# Patient Record
Sex: Female | Born: 1960
Health system: Southern US, Community
[De-identification: ages and names within clinical notes are randomized; demographics above are authoritative.]

## PROBLEM LIST (undated history)

## (undated) DIAGNOSIS — J309 Allergic rhinitis, unspecified: Secondary | ICD-10-CM

## (undated) DIAGNOSIS — K649 Unspecified hemorrhoids: Secondary | ICD-10-CM

## (undated) DIAGNOSIS — K6289 Other specified diseases of anus and rectum: Secondary | ICD-10-CM

## (undated) DIAGNOSIS — D509 Iron deficiency anemia, unspecified: Secondary | ICD-10-CM

## (undated) DIAGNOSIS — R21 Rash and other nonspecific skin eruption: Secondary | ICD-10-CM

## (undated) DIAGNOSIS — R062 Wheezing: Secondary | ICD-10-CM

## (undated) DIAGNOSIS — C801 Malignant (primary) neoplasm, unspecified: Secondary | ICD-10-CM

## (undated) DIAGNOSIS — J45909 Unspecified asthma, uncomplicated: Secondary | ICD-10-CM

## (undated) DIAGNOSIS — J209 Acute bronchitis, unspecified: Secondary | ICD-10-CM

## (undated) DIAGNOSIS — F419 Anxiety disorder, unspecified: Secondary | ICD-10-CM

## (undated) DIAGNOSIS — D3A026 Benign carcinoid tumor of the rectum: Secondary | ICD-10-CM

## (undated) DIAGNOSIS — H918X9 Other specified hearing loss, unspecified ear: Secondary | ICD-10-CM

## (undated) DIAGNOSIS — H1045 Other chronic allergic conjunctivitis: Secondary | ICD-10-CM

## (undated) DIAGNOSIS — N6459 Other signs and symptoms in breast: Secondary | ICD-10-CM

## (undated) DIAGNOSIS — I10 Essential (primary) hypertension: Secondary | ICD-10-CM

## (undated) DIAGNOSIS — T7840XA Allergy, unspecified, initial encounter: Secondary | ICD-10-CM

## (undated) DIAGNOSIS — E663 Overweight: Secondary | ICD-10-CM

## (undated) HISTORY — DX: Other specified diseases of anus and rectum: K62.89

## (undated) HISTORY — DX: Other specified hearing loss, unspecified ear: H91.8X9

## (undated) HISTORY — DX: Other signs and symptoms in breast: N64.59

## (undated) HISTORY — DX: Rash and other nonspecific skin eruption: R21

## (undated) HISTORY — PX: COLONOSCOPY: SHX174

## (undated) HISTORY — DX: Unspecified asthma, uncomplicated: J45.909

## (undated) HISTORY — DX: Allergic rhinitis, unspecified: J30.9

## (undated) HISTORY — DX: Essential (primary) hypertension: I10

## (undated) HISTORY — PX: NO PAST SURGERIES: SHX2092

## (undated) HISTORY — DX: Unspecified hemorrhoids: K64.9

## (undated) HISTORY — DX: Anxiety disorder, unspecified: F41.9

## (undated) HISTORY — DX: Malignant (primary) neoplasm, unspecified: C80.1

## (undated) HISTORY — DX: Benign carcinoid tumor of the rectum: D3A.026

## (undated) HISTORY — DX: Acute bronchitis, unspecified: J20.9

## (undated) HISTORY — DX: Other chronic allergic conjunctivitis: H10.45

## (undated) HISTORY — DX: Overweight: E66.3

## (undated) HISTORY — DX: Allergy, unspecified, initial encounter: T78.40XA

## (undated) HISTORY — DX: Wheezing: R06.2

## (undated) HISTORY — DX: Iron deficiency anemia, unspecified: D50.9

---

## 1997-12-11 ENCOUNTER — Ambulatory Visit (HOSPITAL_BASED_OUTPATIENT_CLINIC_OR_DEPARTMENT_OTHER): Admission: RE | Admit: 1997-12-11 | Discharge: 1997-12-11 | Payer: Self-pay | Admitting: General Surgery

## 1999-01-30 ENCOUNTER — Ambulatory Visit (HOSPITAL_COMMUNITY): Admission: RE | Admit: 1999-01-30 | Discharge: 1999-01-30 | Payer: Self-pay | Admitting: Family Medicine

## 1999-01-30 ENCOUNTER — Encounter: Payer: Self-pay | Admitting: Family Medicine

## 2001-02-20 ENCOUNTER — Other Ambulatory Visit: Admission: RE | Admit: 2001-02-20 | Discharge: 2001-02-20 | Payer: Self-pay | Admitting: *Deleted

## 2004-09-06 ENCOUNTER — Encounter: Admission: RE | Admit: 2004-09-06 | Discharge: 2004-09-06 | Payer: Self-pay | Admitting: Obstetrics and Gynecology

## 2004-09-14 ENCOUNTER — Ambulatory Visit: Payer: Self-pay | Admitting: Endocrinology

## 2004-09-21 ENCOUNTER — Ambulatory Visit (HOSPITAL_COMMUNITY): Admission: RE | Admit: 2004-09-21 | Discharge: 2004-09-21 | Payer: Self-pay | Admitting: Obstetrics and Gynecology

## 2004-09-21 ENCOUNTER — Encounter (INDEPENDENT_AMBULATORY_CARE_PROVIDER_SITE_OTHER): Payer: Self-pay | Admitting: Specialist

## 2004-10-08 ENCOUNTER — Ambulatory Visit: Payer: Self-pay | Admitting: Internal Medicine

## 2004-11-01 ENCOUNTER — Ambulatory Visit: Payer: Self-pay | Admitting: Internal Medicine

## 2004-11-02 ENCOUNTER — Ambulatory Visit: Payer: Self-pay | Admitting: Internal Medicine

## 2004-11-03 ENCOUNTER — Ambulatory Visit: Payer: Self-pay | Admitting: Internal Medicine

## 2004-11-09 ENCOUNTER — Ambulatory Visit: Payer: Self-pay | Admitting: Internal Medicine

## 2004-11-12 ENCOUNTER — Ambulatory Visit: Payer: Self-pay | Admitting: Internal Medicine

## 2004-11-19 ENCOUNTER — Ambulatory Visit: Payer: Self-pay | Admitting: Internal Medicine

## 2004-11-22 ENCOUNTER — Ambulatory Visit: Payer: Self-pay | Admitting: Internal Medicine

## 2004-12-06 ENCOUNTER — Ambulatory Visit: Payer: Self-pay | Admitting: Internal Medicine

## 2004-12-08 ENCOUNTER — Ambulatory Visit: Payer: Self-pay | Admitting: Internal Medicine

## 2004-12-15 ENCOUNTER — Encounter (INDEPENDENT_AMBULATORY_CARE_PROVIDER_SITE_OTHER): Payer: Self-pay | Admitting: *Deleted

## 2004-12-15 ENCOUNTER — Ambulatory Visit: Payer: Self-pay | Admitting: Internal Medicine

## 2004-12-17 ENCOUNTER — Ambulatory Visit: Payer: Self-pay | Admitting: Internal Medicine

## 2004-12-27 ENCOUNTER — Ambulatory Visit: Payer: Self-pay | Admitting: Internal Medicine

## 2004-12-30 ENCOUNTER — Ambulatory Visit: Payer: Self-pay | Admitting: Internal Medicine

## 2005-01-13 ENCOUNTER — Ambulatory Visit: Payer: Self-pay | Admitting: Internal Medicine

## 2005-01-19 ENCOUNTER — Ambulatory Visit: Payer: Self-pay | Admitting: Internal Medicine

## 2005-01-25 ENCOUNTER — Ambulatory Visit: Payer: Self-pay | Admitting: Internal Medicine

## 2005-01-25 ENCOUNTER — Ambulatory Visit: Payer: Self-pay | Admitting: Gastroenterology

## 2005-01-27 ENCOUNTER — Ambulatory Visit: Payer: Self-pay | Admitting: Internal Medicine

## 2005-02-02 ENCOUNTER — Ambulatory Visit: Payer: Self-pay | Admitting: Internal Medicine

## 2005-02-04 ENCOUNTER — Ambulatory Visit: Payer: Self-pay | Admitting: Internal Medicine

## 2005-02-07 ENCOUNTER — Ambulatory Visit: Payer: Self-pay | Admitting: Internal Medicine

## 2005-02-10 ENCOUNTER — Ambulatory Visit: Payer: Self-pay | Admitting: Internal Medicine

## 2005-02-14 ENCOUNTER — Ambulatory Visit: Payer: Self-pay | Admitting: Internal Medicine

## 2005-02-15 ENCOUNTER — Ambulatory Visit (HOSPITAL_COMMUNITY): Admission: RE | Admit: 2005-02-15 | Discharge: 2005-02-15 | Payer: Self-pay | Admitting: Gastroenterology

## 2005-02-15 ENCOUNTER — Encounter (INDEPENDENT_AMBULATORY_CARE_PROVIDER_SITE_OTHER): Payer: Self-pay | Admitting: *Deleted

## 2005-02-15 ENCOUNTER — Ambulatory Visit: Payer: Self-pay | Admitting: Gastroenterology

## 2005-02-17 ENCOUNTER — Ambulatory Visit: Payer: Self-pay | Admitting: Internal Medicine

## 2005-02-22 ENCOUNTER — Ambulatory Visit: Payer: Self-pay | Admitting: Internal Medicine

## 2005-02-25 ENCOUNTER — Ambulatory Visit: Payer: Self-pay | Admitting: Internal Medicine

## 2005-03-03 ENCOUNTER — Ambulatory Visit: Payer: Self-pay | Admitting: Internal Medicine

## 2005-03-04 ENCOUNTER — Ambulatory Visit: Payer: Self-pay | Admitting: Internal Medicine

## 2005-03-08 ENCOUNTER — Ambulatory Visit: Payer: Self-pay | Admitting: Internal Medicine

## 2005-03-11 ENCOUNTER — Ambulatory Visit: Payer: Self-pay | Admitting: Gastroenterology

## 2005-03-11 ENCOUNTER — Ambulatory Visit: Payer: Self-pay | Admitting: Internal Medicine

## 2005-03-14 ENCOUNTER — Ambulatory Visit: Payer: Self-pay | Admitting: Internal Medicine

## 2005-03-18 ENCOUNTER — Ambulatory Visit: Payer: Self-pay | Admitting: Internal Medicine

## 2005-03-18 ENCOUNTER — Ambulatory Visit (HOSPITAL_COMMUNITY): Admission: RE | Admit: 2005-03-18 | Discharge: 2005-03-18 | Payer: Self-pay | Admitting: Gastroenterology

## 2005-03-18 ENCOUNTER — Encounter: Payer: Self-pay | Admitting: Gastroenterology

## 2005-03-21 ENCOUNTER — Ambulatory Visit: Payer: Self-pay | Admitting: Internal Medicine

## 2005-03-25 ENCOUNTER — Ambulatory Visit: Payer: Self-pay | Admitting: Internal Medicine

## 2005-03-30 ENCOUNTER — Ambulatory Visit: Payer: Self-pay | Admitting: Internal Medicine

## 2005-04-05 ENCOUNTER — Ambulatory Visit: Payer: Self-pay | Admitting: Internal Medicine

## 2005-04-15 ENCOUNTER — Ambulatory Visit: Payer: Self-pay | Admitting: Internal Medicine

## 2005-04-22 ENCOUNTER — Ambulatory Visit: Payer: Self-pay | Admitting: Internal Medicine

## 2005-05-06 ENCOUNTER — Ambulatory Visit: Payer: Self-pay | Admitting: Internal Medicine

## 2005-06-17 ENCOUNTER — Ambulatory Visit: Payer: Self-pay | Admitting: Internal Medicine

## 2005-07-07 ENCOUNTER — Ambulatory Visit: Payer: Self-pay | Admitting: Internal Medicine

## 2005-08-05 ENCOUNTER — Ambulatory Visit: Payer: Self-pay | Admitting: Internal Medicine

## 2005-08-19 ENCOUNTER — Ambulatory Visit: Payer: Self-pay | Admitting: Internal Medicine

## 2005-09-19 ENCOUNTER — Ambulatory Visit: Payer: Self-pay | Admitting: Internal Medicine

## 2006-12-27 ENCOUNTER — Ambulatory Visit: Payer: Self-pay | Admitting: Internal Medicine

## 2006-12-27 LAB — CONVERTED CEMR LAB
ALT: 16 units/L (ref 0–35)
AST: 20 units/L (ref 0–37)
Albumin: 3.5 g/dL (ref 3.5–5.2)
Basophils Absolute: 0.1 10*3/uL (ref 0.0–0.1)
Calcium: 9 mg/dL (ref 8.4–10.5)
Chloride: 98 meq/L (ref 96–112)
Creatinine, Ser: 0.6 mg/dL (ref 0.4–1.2)
Eosinophils Relative: 3.6 % (ref 0.0–5.0)
GFR calc non Af Amer: 115 mL/min
Glucose, Bld: 90 mg/dL (ref 70–99)
HCT: 35.9 % — ABNORMAL LOW (ref 36.0–46.0)
Ketones, ur: NEGATIVE mg/dL
LDL Cholesterol: 89 mg/dL (ref 0–99)
Neutrophils Relative %: 44.7 % (ref 43.0–77.0)
Nitrite: NEGATIVE
Platelets: 293 10*3/uL (ref 150–400)
RBC: 4.36 M/uL (ref 3.87–5.11)
RDW: 12.8 % (ref 11.5–14.6)
Sodium: 140 meq/L (ref 135–145)
TSH: 1.38 microintl units/mL (ref 0.35–5.50)
Total Bilirubin: 0.5 mg/dL (ref 0.3–1.2)
Total Protein, Urine: NEGATIVE mg/dL
Urine Glucose: NEGATIVE mg/dL
Urobilinogen, UA: 0.2 (ref 0.0–1.0)
VLDL: 9 mg/dL (ref 0–40)
WBC: 7.9 10*3/uL (ref 4.5–10.5)
pH: 6 (ref 5.0–8.0)

## 2007-01-02 ENCOUNTER — Ambulatory Visit: Payer: Self-pay | Admitting: Internal Medicine

## 2007-01-08 ENCOUNTER — Encounter: Admission: RE | Admit: 2007-01-08 | Discharge: 2007-01-08 | Payer: Self-pay | Admitting: Obstetrics and Gynecology

## 2007-01-16 ENCOUNTER — Encounter: Admission: RE | Admit: 2007-01-16 | Discharge: 2007-01-16 | Payer: Self-pay | Admitting: Obstetrics and Gynecology

## 2007-01-23 ENCOUNTER — Encounter: Admission: RE | Admit: 2007-01-23 | Discharge: 2007-01-23 | Payer: Self-pay | Admitting: Obstetrics and Gynecology

## 2007-05-29 ENCOUNTER — Ambulatory Visit: Payer: Self-pay | Admitting: Internal Medicine

## 2007-05-29 DIAGNOSIS — J45909 Unspecified asthma, uncomplicated: Secondary | ICD-10-CM

## 2007-05-29 DIAGNOSIS — E663 Overweight: Secondary | ICD-10-CM | POA: Insufficient documentation

## 2007-05-29 DIAGNOSIS — D509 Iron deficiency anemia, unspecified: Secondary | ICD-10-CM

## 2007-05-29 DIAGNOSIS — J309 Allergic rhinitis, unspecified: Secondary | ICD-10-CM

## 2007-05-29 DIAGNOSIS — R21 Rash and other nonspecific skin eruption: Secondary | ICD-10-CM

## 2007-05-29 HISTORY — DX: Rash and other nonspecific skin eruption: R21

## 2007-05-29 HISTORY — DX: Allergic rhinitis, unspecified: J30.9

## 2007-05-29 HISTORY — DX: Unspecified asthma, uncomplicated: J45.909

## 2007-05-29 HISTORY — DX: Overweight: E66.3

## 2007-05-29 HISTORY — DX: Iron deficiency anemia, unspecified: D50.9

## 2008-02-13 ENCOUNTER — Encounter: Admission: RE | Admit: 2008-02-13 | Discharge: 2008-02-13 | Payer: Self-pay | Admitting: Internal Medicine

## 2008-05-20 ENCOUNTER — Ambulatory Visit: Payer: Self-pay | Admitting: Internal Medicine

## 2008-05-20 LAB — CONVERTED CEMR LAB
AST: 15 units/L (ref 0–37)
Alkaline Phosphatase: 63 units/L (ref 39–117)
BUN: 5 mg/dL — ABNORMAL LOW (ref 6–23)
Bilirubin Urine: NEGATIVE
Bilirubin, Direct: 0.1 mg/dL (ref 0.0–0.3)
Chloride: 107 meq/L (ref 96–112)
Crystals: NEGATIVE
Eosinophils Relative: 5 % (ref 0.0–5.0)
GFR calc Af Amer: 139 mL/min
GFR calc non Af Amer: 115 mL/min
Glucose, Bld: 85 mg/dL (ref 70–99)
HDL: 59.6 mg/dL (ref >39.0)
Lymphocytes Relative: 42.7 % (ref 12.0–46.0)
Monocytes Absolute: 0.8 10*3/uL (ref 0.1–1.0)
Monocytes Relative: 10.7 % (ref 3.0–12.0)
Neutrophils Relative %: 41.6 % — ABNORMAL LOW (ref 43.0–77.0)
Nitrite: NEGATIVE
Platelets: 241 10*3/uL (ref 150–400)
Potassium: 3.8 meq/L (ref 3.5–5.1)
RDW: 12.5 % (ref 11.5–14.6)
Sodium: 139 meq/L (ref 135–145)
Total CHOL/HDL Ratio: 2.5
Total Protein, Urine: NEGATIVE mg/dL
Total Protein: 6.5 g/dL (ref 6.0–8.3)
Urobilinogen, UA: 0.2 (ref 0.0–1.0)
VLDL: 10 mg/dL (ref 0–40)
WBC: 7.5 10*3/uL (ref 4.5–10.5)

## 2008-05-26 ENCOUNTER — Ambulatory Visit: Payer: Self-pay | Admitting: Internal Medicine

## 2008-05-26 ENCOUNTER — Encounter (INDEPENDENT_AMBULATORY_CARE_PROVIDER_SITE_OTHER): Payer: Self-pay | Admitting: *Deleted

## 2008-05-26 DIAGNOSIS — D3A026 Benign carcinoid tumor of the rectum: Secondary | ICD-10-CM | POA: Insufficient documentation

## 2008-05-26 HISTORY — DX: Benign carcinoid tumor of the rectum: D3A.026

## 2008-06-16 ENCOUNTER — Ambulatory Visit: Payer: Self-pay | Admitting: Internal Medicine

## 2008-06-16 DIAGNOSIS — K649 Unspecified hemorrhoids: Secondary | ICD-10-CM | POA: Insufficient documentation

## 2008-06-16 HISTORY — DX: Unspecified hemorrhoids: K64.9

## 2008-06-19 ENCOUNTER — Encounter: Payer: Self-pay | Admitting: Internal Medicine

## 2008-06-19 ENCOUNTER — Ambulatory Visit: Payer: Self-pay | Admitting: Internal Medicine

## 2008-06-23 ENCOUNTER — Ambulatory Visit: Payer: Self-pay | Admitting: Gastroenterology

## 2008-06-23 ENCOUNTER — Telehealth: Payer: Self-pay | Admitting: Internal Medicine

## 2008-06-23 DIAGNOSIS — K6289 Other specified diseases of anus and rectum: Secondary | ICD-10-CM | POA: Insufficient documentation

## 2008-06-23 HISTORY — DX: Other specified diseases of anus and rectum: K62.89

## 2008-06-25 ENCOUNTER — Telehealth: Payer: Self-pay | Admitting: Gastroenterology

## 2008-07-02 ENCOUNTER — Encounter: Payer: Self-pay | Admitting: Internal Medicine

## 2008-08-04 ENCOUNTER — Ambulatory Visit: Payer: Self-pay | Admitting: Gastroenterology

## 2008-08-14 ENCOUNTER — Ambulatory Visit (HOSPITAL_COMMUNITY): Admission: RE | Admit: 2008-08-14 | Discharge: 2008-08-14 | Payer: Self-pay | Admitting: Gastroenterology

## 2008-08-14 ENCOUNTER — Ambulatory Visit: Payer: Self-pay | Admitting: Gastroenterology

## 2008-08-14 ENCOUNTER — Encounter: Payer: Self-pay | Admitting: Gastroenterology

## 2008-11-27 ENCOUNTER — Ambulatory Visit: Payer: Self-pay | Admitting: Internal Medicine

## 2008-11-27 DIAGNOSIS — H1045 Other chronic allergic conjunctivitis: Secondary | ICD-10-CM

## 2008-11-27 HISTORY — DX: Other chronic allergic conjunctivitis: H10.45

## 2009-01-29 ENCOUNTER — Encounter: Admission: RE | Admit: 2009-01-29 | Discharge: 2009-01-29 | Payer: Self-pay | Admitting: Obstetrics and Gynecology

## 2009-08-17 ENCOUNTER — Encounter (INDEPENDENT_AMBULATORY_CARE_PROVIDER_SITE_OTHER): Payer: Self-pay | Admitting: *Deleted

## 2009-10-08 ENCOUNTER — Ambulatory Visit: Payer: Self-pay | Admitting: Internal Medicine

## 2009-10-08 DIAGNOSIS — H918X9 Other specified hearing loss, unspecified ear: Secondary | ICD-10-CM | POA: Insufficient documentation

## 2009-10-08 HISTORY — DX: Other specified hearing loss, unspecified ear: H91.8X9

## 2010-01-20 ENCOUNTER — Ambulatory Visit: Payer: Self-pay | Admitting: Internal Medicine

## 2010-01-20 LAB — CONVERTED CEMR LAB
ALT: 14 units/L (ref 0–35)
AST: 20 units/L (ref 0–37)
Alkaline Phosphatase: 79 units/L (ref 39–117)
BUN: 11 mg/dL (ref 6–23)
Bilirubin, Direct: 0.1 mg/dL (ref 0.0–0.3)
CO2: 29 meq/L (ref 19–32)
Eosinophils Relative: 6.1 % — ABNORMAL HIGH (ref 0.0–5.0)
Glucose, Bld: 93 mg/dL (ref 70–99)
HDL: 52.8 mg/dL (ref >39.00)
Lymphocytes Relative: 39.7 % (ref 12.0–46.0)
Monocytes Relative: 6.8 % (ref 3.0–12.0)
Neutrophils Relative %: 46.4 % (ref 43.0–77.0)
Nitrite: NEGATIVE
Platelets: 301 10*3/uL (ref 150.0–400.0)
Potassium: 4 meq/L (ref 3.5–5.1)
RBC: 4.29 M/uL (ref 3.87–5.11)
Sodium: 141 meq/L (ref 135–145)
Total Bilirubin: 0.3 mg/dL (ref 0.3–1.2)
Total CHOL/HDL Ratio: 3
Urine Glucose: NEGATIVE mg/dL
VLDL: 8.8 mg/dL (ref 0.0–40.0)
WBC: 8.6 10*3/uL (ref 4.5–10.5)
pH: 5.5 (ref 5.0–8.0)

## 2010-01-21 ENCOUNTER — Ambulatory Visit: Payer: Self-pay | Admitting: Internal Medicine

## 2010-01-21 DIAGNOSIS — N6459 Other signs and symptoms in breast: Secondary | ICD-10-CM | POA: Insufficient documentation

## 2010-01-21 HISTORY — DX: Other signs and symptoms in breast: N64.59

## 2010-01-28 ENCOUNTER — Encounter: Payer: Self-pay | Admitting: Internal Medicine

## 2010-01-29 ENCOUNTER — Encounter: Payer: Self-pay | Admitting: Internal Medicine

## 2010-02-04 ENCOUNTER — Encounter: Payer: Self-pay | Admitting: Internal Medicine

## 2010-03-15 ENCOUNTER — Telehealth: Payer: Self-pay | Admitting: Internal Medicine

## 2010-03-24 ENCOUNTER — Encounter (INDEPENDENT_AMBULATORY_CARE_PROVIDER_SITE_OTHER): Payer: Self-pay | Admitting: *Deleted

## 2010-03-26 ENCOUNTER — Encounter (INDEPENDENT_AMBULATORY_CARE_PROVIDER_SITE_OTHER): Payer: Self-pay | Admitting: *Deleted

## 2010-03-29 ENCOUNTER — Ambulatory Visit: Payer: Self-pay | Admitting: Internal Medicine

## 2010-04-08 ENCOUNTER — Ambulatory Visit: Payer: Self-pay | Admitting: Internal Medicine

## 2010-06-04 ENCOUNTER — Ambulatory Visit: Payer: Self-pay | Admitting: Internal Medicine

## 2010-06-06 DIAGNOSIS — J209 Acute bronchitis, unspecified: Secondary | ICD-10-CM

## 2010-06-06 HISTORY — DX: Acute bronchitis, unspecified: J20.9

## 2010-06-22 ENCOUNTER — Ambulatory Visit: Payer: Self-pay | Admitting: Internal Medicine

## 2010-06-22 DIAGNOSIS — R062 Wheezing: Secondary | ICD-10-CM

## 2010-06-22 HISTORY — DX: Wheezing: R06.2

## 2010-07-25 ENCOUNTER — Encounter: Payer: Self-pay | Admitting: Obstetrics and Gynecology

## 2010-08-05 NOTE — Letter (Signed)
Summary: Pre Visit Letter Revised  [facility]  [address] [place], [place] [address]   Phone: [phone]  Fax: [phone]        03/24/2010 MRN: [id number]  Michelle Solomon [address] [place], [place]  [address]             Procedure Date: April 07, 2010  Welcome to the Gastroenterology Division at [facility].    You are scheduled to see a nurse for your pre-procedure visit on March 29, 2010 at 10:30am on the 3rd floor at [facility], [address]  We ask that you try to arrive at our office 15 minutes prior to your appointment time to allow for check-in.  Please take a minute to review the attached form.  If you answer "Yes" to one or more of the questions on the first page, we ask that you call the person listed at your earliest opportunity.  If you answer "No" to all of the questions, please complete the rest of the form and bring it to your appointment.    Your nurse visit will consist of discussing your medical and surgical history, your immediate family medical history, and your medications.   If you are unable to list all of your medications on the form, please bring the medication bottles to your appointment and we will list them.  We will need to be aware of both prescribed and over the counter drugs.  We will need to know exact dosage information as well.    Please be prepared to read and sign documents such as consent forms, a financial agreement, and acknowledgement forms.  If necessary, and with your consent, a friend or relative is welcome to sit-in on the nurse visit with you.  Please bring your insurance card so that we may make a copy of it.  If your insurance requires a referral to see a specialist, please bring your referral form from your primary care physician.  No co-pay is required for this nurse visit.     If you cannot keep your appointment, please call [phone] to cancel or reschedule prior to your  appointment date.  This allows [place] the opportunity to schedule an appointment for another patient in need of care.    Thank you for choosing [facility] for your medical needs.  We appreciate the opportunity to care for you.  Please visit [place] at our website  to learn more about our practice.  Sincerely, The Gastroenterology Division

## 2010-08-05 NOTE — Miscellaneous (Signed)
Summary: [facility] previsit  Clinical Lists Changes 

## 2010-08-05 NOTE — Progress Notes (Signed)
Summary: Schedule Flex  Phone Note Outgoing Call Call back at [facility] Phone [phone]   Call placed by: Harlow Mares CMA Duncan Dull),  March 15, 2010 11:44 AM Call placed to: Patient Summary of Call: Left a message on patients machine to call back, patient is due for a flex.  Initial call taken by: Harlow Mares CMA Duncan Dull),  March 15, 2010 11:44 AM  Follow-up for Phone Call        Left a message on the patient machine to call back and schedule a previsit and procedure with our office. A letter will be mailed to the patient.   Follow-up by: Harlow Mares CMA Duncan Dull),  March 24, 2010 10:33 AM

## 2010-08-05 NOTE — Procedures (Signed)
Summary: Flexible Sigmoidoscopy  Patient: Michelle Solomon Note: All result statuses are Final unless otherwise noted.  Tests: (1) Flexible Sigmoidoscopy (FLX)  FLX Flexible Sigmoidoscopy                             DONE     [facility]     [address]     [place], [place]  [address]           FLEXIBLE SIGMOIDOSCOPY PROCEDURE REPORT           PATIENT:  Michelle Solomon, Michelle Solomon  MR#:  [id number]     BIRTHDATE:  [birth year], 48 yrs. old  GENDER:  female           ENDOSCOPIST:  Iva Boop, MD, [facility]           PROCEDURE DATE:  04/08/2010     PROCEDURE:  Flexible Sigmoidoscopy, diagnostic     ASA CLASS:  Class II     INDICATIONS:  Follow-up rectal carcinoid, removed (second time)     2010           MEDICATIONS:   Fentanyl 25 mcg IV, Versed 4 mg IV           DESCRIPTION OF PROCEDURE:   After the risks benefits and     alternatives of the procedure were thoroughly explained, informed     consent was obtained.  Digital rectal exam was performed and     revealed mildly tender anal canal but no other abnormalities.     The [id number] endoscope was introduced through the anus     and advanced to the sigmoid colon, without limitations.  The     quality of the prep was excellent.  The instrument was then slowly     withdrawn as the mucosa was fully examined.     <<PROCEDUREIMAGES>>           Normal sigmoid colon (distal).  The mucosa was abnormal. in the     rectum. Scar from removal of carcinoid. No nodules.   Retroflexed     views in the rectum revealed small hemorrhoids.    The scope was     then withdrawn from the patient and the procedure terminated.           COMPLICATIONS:  None           ENDOSCOPIC IMPRESSION:     1) Normal sigmoid     2) Abnormal mucosa in the rectum - scar without nodules at site     of prior rectal carcinoid     3) Small hemorrhoids           REPEAT EXAM:  In for Colonoscopy. 12/2014 for routine screening           Iva Boop, MD,  Clementeen Graham           CC:  The Patient     Rob Bunting, MD           n.     Rosalie Doctor:   Iva Boop at 04/08/2010 01:59 PM           Dory Peru, [phone]  Note: An exclamation mark (!) indicates a result that was not dispersed into the flowsheet. Document Creation Date: 04/08/2010 1:59 PM _______________________________________________________________________  (1) Order result status: Final Collection or observation date-time: 04/08/2010 13:45 Requested date-time:  Receipt date-time:  Reported date-time:  Referring Physician:  Ordering Physician: Stan Head [phone]) Specimen Source:  Source: Launa Grill Order Number: [id number] Lab site:   Appended Document: Flexible Sigmoidoscopy    Clinical Lists Changes  Observations: Added new observation of COLONNXTDUE: 12/2014 (04/08/2010 15:16)

## 2010-08-05 NOTE — Assessment & Plan Note (Signed)
Summary: COUGH-SNEEZE-WHEEZ'G---BREATH'G DIFFICULT--STC   Vital Signs:  Patient profile:   50 year old female Height:      64 inches Weight:      188.50 pounds BMI:     32.47 O2 Sat:      96 % on Room air Temp:     98 degrees F oral Pulse rate:   100 / minute BP sitting:   112 / 80  (left arm) Cuff size:   regular  Vitals Entered By: Zella Ball Ewing CMA Duncan Dull) (June 22, 2010 2:57 PM)  O2 Flow:  Room air CC: Sore Throat, cough and neck pain/RE   Primary Care Lakayla Barrington:  Oliver Barre, MD  CC:  Sore Throat and cough and neck pain/RE.  History of Present Illness: here with acute onset 3 days mild to mod fever, mild ST, increasingly prod cough greenish sputum, and today with mild wheezing without signficant sob/doe.  Pt denies CP,  orthopnea, pnd, worsening LE edema, palps, dizziness or syncope  Pt denies new neuro symptoms such as headache, facial or extremity weakness  Pt denies polydipsia, polyuria, Overall good compliance with meds, trying to follow low chol diet, wt stable. Does have mild nasal allergy symptoms with clearish d/c for several wks, without fever, pain, colored d/c, ST or cough, out of allegra.    Preventive Screening-Counseling & Management      Drug Use:  no.    Problems Prior to Update: 1)  Wheezing  (ICD-786.07) 2)  Acute Bronchitis  (ICD-466.0) 3)  Nipple Discharge  (ICD-611.79) 4)  Other Specified Forms of Hearing Loss  (ICD-389.8) 5)  Conjunctivitis, Allergic  (ICD-372.14) 6)  Anal or Rectal Pain  (ICD-569.42) 7)  Special Screening For Malignant Neoplasms Colon  (ICD-V76.51) 8)  Hemorrhoids  (ICD-455.6) 9)  Preventive Health Care  (ICD-V70.0) 10)  Benign Carcinoid Tumor of The Rectum  (ICD-209.57) 11)  Rash-nonvesicular  (ICD-782.1) 12)  Overweight  (ICD-278.02) 13)  Asthma  (ICD-493.90) 14)  Anemia-iron Deficiency  (ICD-280.9) 15)  Allergic Rhinitis  (ICD-477.9)  Medications Prior to Update: 1)  Fexofenadine Hcl 180 Mg Tabs (Fexofenadine Hcl) .[name] Kitchen..  1po Once Daily As Needed 2)  Zaditor 0.025 % Soln (Ketotifen Fumarate) .... Use Asd 3)  Proair Hfa 108 (90 Base) Mcg/act Aers (Albuterol Sulfate) .... 2 Puffs Four Times Per Day As Needed Shortness of Breath 4)  Benzonatate 100 Mg Caps (Benzonatate) .[name] Kitchen.. 1 By Mouth Three Times A Day X 5 Days, Then As Needed For Cough Symptoms  Current Medications (verified): 1)  Fexofenadine Hcl 180 Mg Tabs (Fexofenadine Hcl) .[name] Kitchen.. 1po Once Daily As Needed 2)  Zaditor 0.025 % Soln (Ketotifen Fumarate) .... Use Asd 3)  Proair Hfa 108 (90 Base) Mcg/act Aers (Albuterol Sulfate) .... 2 Puffs Four Times Per Day As Needed Shortness of Breath 4)  Hydrocodone-Homatropine 5-1.5 Mg/33ml Syrp (Hydrocodone-Homatropine) .[name] Kitchen.. 1 Tsp By Mouth Q 6 Hrs As Needed 5)  Levofloxacin 500 Mg Tabs (Levofloxacin) .[name] Kitchen.. 1po Once Daily 6)  Prednisone 10 Mg Tabs (Prednisone) .... 3po Qd For 3days, Then 2po Qd For 3days, Then 1po Qd For 3days, Then Stop  Allergies (verified): No Known Drug Allergies  Past History:  Past Medical History: Last updated: 06/04/2010 Allergic rhinitis Anemia-iron deficiency Asthma  hx of rectal carcinoid  Past Surgical History: Last updated: 05/29/2007 Denies surgical history  Social History: Last updated: 06/22/2010 Current Smoker - 1 pack weekly Alcohol use-yes Occupation:Customer Service  Daily Caffeine Use -3 Illicit Drug Use - no Patient does not get regular exercise.  Drug use-no  Risk Factors: Exercise: no (06/23/2008)  Risk Factors: Smoking Status: current (05/29/2007)  Social History: Current Smoker - 1 pack weekly Alcohol use-yes Occupation:Customer Service  Daily Caffeine Use -3 Illicit Drug Use - no Patient does not get regular exercise.  Drug use-no  Review of Systems       all otherwise negative per pt -    Physical Exam  General:  alert, well-developed, well-nourished, and cooperative to examination.   mod ill, cough Head:  normocephalic and atraumatic.   Eyes:   vision grossly intact; pupils equal, round and reactive to light.  conjunctiva and lids normal.    Ears:  bialt tm's mild red, sinus nontender Nose:  nasal dischargemucosal pallor and mucosal edema.   Mouth:  pharyngeal erythema and fair dentition.   Neck:  supple and no masses.   Lungs:  normal respiratory effort, R decreased breath sounds, R wheezes, L decreased breath sounds, and L wheezes.   Heart:  normal rate and regular rhythm.   Extremities:  no edema, no erythema    Impression & Recommendations:  Problem # 1:  ACUTE BRONCHITIS (ICD-466.0)  Her updated medication list for this problem includes:    Proair Hfa 108 (90 Base) Mcg/act Aers (Albuterol sulfate) .[name] Kitchen... 2 puffs four times per day as needed shortness of breath    Hydrocodone-homatropine 5-1.5 Mg/45ml Syrp (Hydrocodone-homatropine) .[name] Kitchen... 1 tsp by mouth q 6 hrs as needed    Levofloxacin 500 Mg Tabs (Levofloxacin) .[name] Kitchen... 1po once daily treat as above, f/u any worsening signs or symptoms   Problem # 2:  WHEEZING (ICD-786.07)  mild, likely related to above, for depomedrol IM today, predpack for home, and symbicort sample 80/6.25   Orders: Depo- Medrol 40mg  (J1030) Depo- Medrol 80mg  (J1040) Admin of Therapeutic Inj  intramuscular or subcutaneous (09811)  Problem # 3:  ALLERGIC RHINITIS (ICD-477.9)  Her updated medication list for this problem includes:    Fexofenadine Hcl 180 Mg Tabs (Fexofenadine hcl) .[name] Kitchen... 1po once daily as needed stable overall by hx and exam, ok to continue meds/tx as is   Complete Medication List: 1)  Fexofenadine Hcl 180 Mg Tabs (Fexofenadine hcl) .[name] Kitchen.. 1po once daily as needed 2)  Zaditor 0.025 % Soln (Ketotifen fumarate) .... Use asd 3)  Proair Hfa 108 (90 Base) Mcg/act Aers (Albuterol sulfate) .... 2 puffs four times per day as needed shortness of breath 4)  Hydrocodone-homatropine 5-1.5 Mg/18ml Syrp (Hydrocodone-homatropine) .[name] Kitchen.. 1 tsp by mouth q 6 hrs as needed 5)  Levofloxacin 500 Mg Tabs  (Levofloxacin) .[name] Kitchen.. 1po once daily 6)  Prednisone 10 Mg Tabs (Prednisone) .... 3po qd for 3days, then 2po qd for 3days, then 1po qd for 3days, then stop  Patient Instructions: 1)  you had the steroid shot today 2)  Please take all new medications as prescribed  - the antibiotic, cough med, and lower dose prednisone 3)  Continue all previous medications as before this visit  4)  The symbicort is 2 puffs two times a day until gone 5)  Please schedule a follow-up appointment in July 2012 for CPX with labs, or sooner if needed Prescriptions: PREDNISONE 10 MG TABS (PREDNISONE) 3po qd for 3days, then 2po qd for 3days, then 1po qd for 3days, then stop  #18 x 0   Entered and Authorized by:   Corwin Levins MD   Signed by:   Corwin Levins MD on 06/22/2010   Method used:   Print then Give to Patient   RxID:  [id number] HYDROCODONE-HOMATROPINE 5-1.5 MG/5ML SYRP (HYDROCODONE-HOMATROPINE) 1 tsp by mouth q 6 hrs as needed  #6oz x 1   Entered and Authorized by:   Corwin Levins MD   Signed by:   Corwin Levins MD on 06/22/2010   Method used:   Print then Give to Patient   RxID:   [id number] LEVOFLOXACIN 500 MG TABS (LEVOFLOXACIN) 1po once daily  #10 x 0   Entered and Authorized by:   Corwin Levins MD   Signed by:   Corwin Levins MD on 06/22/2010   Method used:   Print then Give to Patient   RxID:   [id number]    Medication Administration  Injection # 1:    Medication: Depo- Medrol 40mg     Diagnosis: WHEEZING (ICD-786.07)    Route: IM    Site: RUOQ gluteus    Exp Date: 10/2012    Lot #: [id number]    Mfr: Pharmacia    Comments: Patient received 120mg  Depo-Medrol    Patient tolerated injection without complications    Given by: Zella Ball Ewing CMA Duncan Dull) (June 22, 2010 3:39 PM)  Injection # 2:    Medication: Depo- Medrol 80mg     Diagnosis: WHEEZING (ICD-786.07)    Route: IM    Site: RUOQ gluteus    Exp Date: 10/2012    Lot #: [id number]    Mfr: Pharmacia    Given by: Zella Ball Ewing CMA  Duncan Dull) (June 22, 2010 3:39 PM)  Orders Added: 1)  Depo- Medrol 40mg  [J1030] 2)  Depo- Medrol 80mg  [J1040] 3)  Admin of Therapeutic Inj  intramuscular or subcutaneous [96372] 4)  Est. Patient Level IV [01027]

## 2010-08-05 NOTE — Assessment & Plan Note (Signed)
Summary: COUGH WHEEZING---DR JWJ PT/NO SLOT--STC   Vital Signs:  Patient profile:   50 year old female Height:      64 inches (162.56 cm) Weight:      193 pounds (87.73 kg) O2 Sat:      98 % on Room air Temp:     98.3 degrees F (36.83 degrees C) oral Pulse rate:   85 / minute BP sitting:   110 / 74  (left arm) Cuff size:   large  Vitals Entered By: Orlan Leavens RMA (June 04, 2010 4:17 PM)  O2 Flow:  Room air CC: Cough/ wheezing, URI symptoms Is Patient Diabetic? No Pain Assessment Patient in pain? no        Primary Care Provider:  Oliver Barre, MD  CC:  Cough/ wheezing and URI symptoms.  History of Present Illness:  URI Symptoms      This is a 50 year old woman who presents with URI symptoms.  The symptoms began 1 week ago.  The severity is described as moderate.  The patient reports nasal congestion, sore throat, dry cough, productive cough, and sick contacts.  Associated symptoms include dyspnea and wheezing.  The patient denies fever, vomiting, diarrhea, and use of an antipyretic.  The patient also reports itchy throat, headache, muscle aches, and severe fatigue.  The patient denies seasonal symptoms.  The patient denies the following risk factors for Strep sinusitis: tooth pain and Strep exposure.    Current Medications (verified): 1)  Fexofenadine Hcl 180 Mg Tabs (Fexofenadine Hcl) .[name] Kitchen.. 1po Once Daily As Needed 2)  Zaditor 0.025 % Soln (Ketotifen Fumarate) .... Use Asd 3)  Proair Hfa 108 (90 Base) Mcg/act Aers (Albuterol Sulfate) .... 2 Puffs Four Times Per Day As Needed Shortness of Breath  Allergies (verified): No Known Drug Allergies  Past History:  Past Medical History: Allergic rhinitis Anemia-iron deficiency Asthma  hx of rectal carcinoid  Review of Systems  The patient denies anorexia, vision loss, syncope, hemoptysis, and severe indigestion/heartburn.    Physical Exam  General:  alert, well-developed, well-nourished, and cooperative to  examination.   mod ill, cough Eyes:  vision grossly intact; pupils equal, round and reactive to light.  conjunctiva and lids normal.    Ears:  normal pinnae bilaterally, without erythema, swelling, or tenderness to palpation. TMs clear, without effusion, or cerumen impaction. Hearing grossly normal bilaterally  Mouth:  teeth and gums in good repair; mucous membranes moist, without lesions or ulcers. oropharynx clear without exudate, mild erythema.  Lungs:  few rhonchi, no wheeze - good B air mvmt Heart:  normal rate, regular rhythm, no murmur, and no rub. BLE without edema. Neurologic:  alert & oriented X3 and cranial nerves II-XII symetrically intact.  strength normal in all extremities, sensation intact to light touch, and gait normal. speech fluent without dysarthria or aphasia; follows commands with good comprehension.    Impression & Recommendations:  Problem # 1:  ACUTE BRONCHITIS (ICD-466.0)  no asthma exac at this time - tx abx + antitussives The following medications were removed from the medication list:    Advair Diskus 250-50 Mcg/dose Aepb (Fluticasone-salmeterol) .[name] Kitchen... 1 puff two times a day Her updated medication list for this problem includes:    Proair Hfa 108 (90 Base) Mcg/act Aers (Albuterol sulfate) .[name] Kitchen... 2 puffs four times per day as needed shortness of breath    Azithromycin 250 Mg Tabs (Azithromycin) .[name] Kitchen... 2 tabs by mouth today, then 1 by mouth daily starting tomorrow  Benzonatate 100 Mg Caps (Benzonatate) .[name] Kitchen... 1 by mouth three times a day x 5 days, then as needed for cough symptoms  Take antibiotics and other medications as directed. Encouraged to push clear liquids, get enough rest, and take acetaminophen as needed. To be seen in 5-7 days if no improvement, sooner if worse.  Orders: Prescription Created Electronically [phone])  Problem # 2:  ALLERGIC RHINITIS (ICD-477.9) reminded to use oral antihist - consider resuming nasal steroid if inc sinus symptoms  The  following medications were removed from the medication list:    Fluticasone Propionate 50 Mcg/act Susp (Fluticasone propionate) .[name] Kitchen... 2 spray/side once daily Her updated medication list for this problem includes:    Fexofenadine Hcl 180 Mg Tabs (Fexofenadine hcl) .[name] Kitchen... 1po once daily as needed  Complete Medication List: 1)  Fexofenadine Hcl 180 Mg Tabs (Fexofenadine hcl) .[name] Kitchen.. 1po once daily as needed 2)  Zaditor 0.025 % Soln (Ketotifen fumarate) .... Use asd 3)  Proair Hfa 108 (90 Base) Mcg/act Aers (Albuterol sulfate) .... 2 puffs four times per day as needed shortness of breath 4)  Azithromycin 250 Mg Tabs (Azithromycin) .... 2 tabs by mouth today, then 1 by mouth daily starting tomorrow 5)  Benzonatate 100 Mg Caps (Benzonatate) .[name] Kitchen.. 1 by mouth three times a day x 5 days, then as needed for cough symptoms  Patient Instructions: 1)  it was good to see you today. 2)  Zpack, cough pills and allergy medication as discussed - your prescriptions have been given to you to submit to your pharmacy. Please take as directed. Contact our office if you believe you're having problems with the medication(s).  3)  Get plenty of rest, drink lots of clear liquids, and use Tylenol or Ibuprofen for fever and comfort. Return in 7-10 days if you're not better:sooner if you're feeling worse. Prescriptions: FEXOFENADINE HCL 180 MG TABS (FEXOFENADINE HCL) 1po once daily as needed  #180 x 3   Entered and Authorized by:   Newt Lukes MD   Signed by:   Newt Lukes MD on 06/04/2010   Method used:   Print then Give to Patient   RxID:   [id number] BENZONATATE 100 MG CAPS (BENZONATATE) 1 by mouth three times a day x 5 days, then as needed for cough symptoms  #30 x 0   Entered and Authorized by:   Newt Lukes MD   Signed by:   Newt Lukes MD on 06/04/2010   Method used:   Print then Give to Patient   RxID:   [id number] AZITHROMYCIN 250 MG TABS (AZITHROMYCIN) 2 tabs by mouth today,  then 1 by mouth daily starting tomorrow  #6 x 0   Entered and Authorized by:   Newt Lukes MD   Signed by:   Newt Lukes MD on 06/04/2010   Method used:   Electronically to        [facility]  [phone]* (retail)       [address] [place], [place]  [address]       Ph: [phone] or [phone]       Fax: [phone]   RxID:   [id number]    Orders Added: 1)  Est. Patient Level IV [[address]] 2)  Prescription Created Electronically [phone]

## 2010-08-05 NOTE — Letter (Signed)
Summary: Flex Sigmoidoscopy Letter  [facility]  [address] [place], [place] [address]   Phone: [phone]  Fax: [phone]      August 17, 2009 MRN: [id number]   Michelle Solomon [address] [place], [place]  [address]   Dear Ms. Shams,   According to your medical record, it is time for you to schedule a Flexible Sigmoidoscopy. The American Cancer Society recommends this procedure as a method to detect early colon cancer. Patients with a family history of colon cancer, or a personal history of colon polyps or imflammatory bowel disease are at increased risk.  This letter has been generated based on the recommendations made at the time of your prior procedure. If you feel that in your particular situation this may no longer apply, please contact our office.  Please call our office at [phone]) to schedule this appointment or to update your records at your earliest convenience.  Thank you for cooperating with [place] to provide you with the very best care possible.   Sincerely,  Iva Boop, M.D.  [facility] [phone]

## 2010-08-05 NOTE — Assessment & Plan Note (Signed)
Summary: allergies/sinus?-lb   Vital Signs:  Patient profile:   50 year old female Height:      64 inches Weight:      199.25 pounds BMI:     34.32 O2 Sat:      98 % on Room air Temp:     97.9 degrees F oral Pulse rate:   99 / minute BP sitting:   124 / 82  (left arm) Cuff size:   regular  Vitals Entered ByZella Ball Ewing (October 08, 2009 9:32 AM)  O2 Flow:  Room air CC: Sinus congestion, wheezing, right ear pain/RE   Primary Care Provider:  Oliver Barre, MD  CC:  Sinus congestion, wheezing, and right ear pain/RE.  History of Present Illness: here with gradual worsening over 3 to 4 wks symtpoms of mod to now severe nasal and sinus allergy symptoms with cleearish drainage and post nasal gtt, slight ST and cough but without headache, fever, colored drainage, and Pt denies CP orthopnea, pnd, worsening LE edema, palps, dizziness or syncope   Does have just 2 days onset hearing loss on the right without increased pain, fever, vertigo or  n/v.  Has had problem with wax in the past.  Overall, zyrtec, benadryl otc and otherOTC sprays not helping, and today with onset mild wheezing and sob/doe with awakening last PM with wheezing as well.    Problems Prior to Update: 1)  Other Specified Forms of Hearing Loss  (ICD-389.8) 2)  Conjunctivitis, Allergic  (ICD-372.14) 3)  Anal or Rectal Pain  (ICD-569.42) 4)  Special Screening For Malignant Neoplasms Colon  (ICD-V76.51) 5)  Hemorrhoids  (ICD-455.6) 6)  Preventive Health Care  (ICD-V70.0) 7)  Benign Carcinoid Tumor of The Rectum  (ICD-209.57) 8)  Rash-nonvesicular  (ICD-782.1) 9)  Overweight  (ICD-278.02) 10)  Asthma  (ICD-493.90) 11)  Anemia-iron Deficiency  (ICD-280.9) 12)  Allergic Rhinitis  (ICD-477.9)  Medications Prior to Update: 1)  Prednisone 20 Mg Tabs (Prednisone) .[name] Kitchen.. 1 By Mouth Once Daily For 5 Days 2)  Cetirizine Hcl 10 Mg Tabs (Cetirizine Hcl) .[name] Kitchen.. 1 By Mouth Once Daily As Needed Allergies 3)  Zaditor 0.025 % Soln (Ketotifen  Fumarate) .... Use Asd  Current Medications (verified): 1)  Prednisone 10 Mg Tabs (Prednisone) .... 3po Qd For 3days, Then 2po Qd For 3days, Then 1po Qd For 3days, Then Stop 2)  Fexofenadine Hcl 180 Mg Tabs (Fexofenadine Hcl) .[name] Kitchen.. 1po Once Daily As Needed 3)  Zaditor 0.025 % Soln (Ketotifen Fumarate) .... Use Asd 4)  Fluticasone Propionate 50 Mcg/act Susp (Fluticasone Propionate) .... 2 Spray/side Once Daily 5)  Advair Diskus 250-50 Mcg/dose Aepb (Fluticasone-Salmeterol) .[name] Kitchen.. 1 Puff Two Times A Day 6)  Proair Hfa 108 (90 Base) Mcg/act Aers (Albuterol Sulfate) .... 2 Puffs Four Times Per Day As Needed Shortness of Breath  Allergies (verified): No Known Drug Allergies  Past History:  Past Medical History: Last updated: 05/26/2008 Allergic rhinitis Anemia-iron deficiency Asthma hx of rectal carcinoid  Past Surgical History: Last updated: 05/29/2007 Denies surgical history  Social History: Last updated: 06/23/2008 Current Smoker - 1 pack weekly Alcohol use-yes Occupation:Customer Service  Daily Caffeine Use -3 Illicit Drug Use - no Patient does not get regular exercise.   Risk Factors: Exercise: no (06/23/2008)  Risk Factors: Smoking Status: current (05/29/2007)  Review of Systems       all otherwise negative per pt -    Physical Exam  General:  alert and overweight-appearing.  , not ill appearing but uncomfortable, scratches occasinally  Head:  normocephalic and atraumatic.   Eyes:  vision grossly intact, pupils equal, and pupils round.   Ears:  hearing loss resolved after wax removed with irrigation; bilat tm's mild red, sinus nontender Nose:  nasal dischargemucosal pallor and mucosal edema.   Mouth:  pharyngeal erythema and fair dentition.   Neck:  supple and no masses.   Lungs:  normal respiratory effort, R decreased breath sounds, and L decreased breath sound and mild wheeze noted.   Heart:  normal rate and regular rhythm.   Extremities:  no edema, no  erythema   Impression & Recommendations:  Problem # 1:  ALLERGIC RHINITIS (ICD-477.9)  Her updated medication list for this problem includes:    Fexofenadine Hcl 180 Mg Tabs (Fexofenadine hcl) .[name] Kitchen... 1po once daily as needed    Fluticasone Propionate 50 Mcg/act Susp (Fluticasone propionate) .[name] Kitchen... 2 spray/side once daily  Orders: Depo- Medrol 40mg  (J1030) Depo- Medrol 80mg  (J1040) Admin of Therapeutic Inj  intramuscular or subcutaneous (19147) mod to severe, for depo shot, and treat as above, f/u any worsening signs or symptoms   Problem # 2:  ASTHMA (ICD-493.90)  Her updated medication list for this problem includes:    Prednisone 10 Mg Tabs (Prednisone) .[name] Kitchen... 3po qd for 3days, then 2po qd for 3days, then 1po qd for 3days, then stop    Advair Diskus 250-50 Mcg/dose Aepb (Fluticasone-salmeterol) .[name] Kitchen... 1 puff two times a day    Proair Hfa 108 (90 Base) Mcg/act Aers (Albuterol sulfate) .[name] Kitchen... 2 puffs four times per day as needed shortness of breath treat as above, f/u any worsening signs or symptoms   Problem # 3:  OTHER SPECIFIED FORMS OF HEARING LOSS (ICD-389.8) improved after right ear irrigation  Problem # 4:  CONJUNCTIVITIS, ALLERGIC (ICD-372.14) for OTC zaditor for the eyes as needed   Complete Medication List: 1)  Prednisone 10 Mg Tabs (Prednisone) .... 3po qd for 3days, then 2po qd for 3days, then 1po qd for 3days, then stop 2)  Fexofenadine Hcl 180 Mg Tabs (Fexofenadine hcl) .[name] Kitchen.. 1po once daily as needed 3)  Zaditor 0.025 % Soln (Ketotifen fumarate) .... Use asd 4)  Fluticasone Propionate 50 Mcg/act Susp (Fluticasone propionate) .... 2 spray/side once daily 5)  Advair Diskus 250-50 Mcg/dose Aepb (Fluticasone-salmeterol) .[name] Kitchen.. 1 puff two times a day 6)  Proair Hfa 108 (90 Base) Mcg/act Aers (Albuterol sulfate) .... 2 puffs four times per day as needed shortness of breath  Patient Instructions: 1)  you had the steroid shot today 2)  you had the right ear cleared of wax  today 3)  stop the zyrtec, and benadryl 4)  take the generic prednisone as prescribed 5)  take the generic allegra as needed (fexofenadine) 6)  take the generic flonase for the nose 7)  take the advair at 1 puff twice per day (regularly) to stop the wheezing, and the proair HFA inhaler as needed for the wheezing 8)  you can also use the OTC Zaditor for the eyes 9)  Please schedule a follow-up appointment in 2 months with CPX labs Prescriptions: PROAIR HFA 108 (90 BASE) MCG/ACT AERS (ALBUTEROL SULFATE) 2 puffs four times per day as needed shortness of breath  #1 x 11   Entered and Authorized by:   Corwin Levins MD   Signed by:   Corwin Levins MD on 10/08/2009   Method used:   Print then Give to Patient   RxID:   [id number] ADVAIR DISKUS 250-50 MCG/DOSE AEPB (FLUTICASONE-SALMETEROL) 1 puff two  times a day  #1 x 11   Entered and Authorized by:   Corwin Levins MD   Signed by:   Corwin Levins MD on 10/08/2009   Method used:   Print then Give to Patient   RxID:   [id number] FLUTICASONE PROPIONATE 50 MCG/ACT SUSP (FLUTICASONE PROPIONATE) 2 spray/side once daily  #1 x 11   Entered and Authorized by:   Corwin Levins MD   Signed by:   Corwin Levins MD on 10/08/2009   Method used:   Print then Give to Patient   RxID:   [id number] PREDNISONE 10 MG TABS (PREDNISONE) 3po qd for 3days, then 2po qd for 3days, then 1po qd for 3days, then stop  #18 x 0   Entered and Authorized by:   Corwin Levins MD   Signed by:   Corwin Levins MD on 10/08/2009   Method used:   Print then Give to Patient   RxID:   [id number] FEXOFENADINE HCL 180 MG TABS (FEXOFENADINE HCL) 1po once daily as needed  #30 x 11   Entered and Authorized by:   Corwin Levins MD   Signed by:   Corwin Levins MD on 10/08/2009   Method used:   Print then Give to Patient   RxID:   [id number]    Medication Administration  Injection # 1:    Medication: Depo- Medrol 40mg     Diagnosis: ALLERGIC RHINITIS (ICD-477.9)     Route: IM    Site: LUOQ gluteus    Exp Date: 05/2012    Lot #: [id number]    Mfr: Pharmacia    Given by: Zella Ball Ewing (October 08, 2009 10:21 AM)  Injection # 2:    Medication: Depo- Medrol 80mg     Diagnosis: ALLERGIC RHINITIS (ICD-477.9)    Route: IM    Site: LUOQ gluteus    Exp Date: 05/2012    Lot #: [id number]    Mfr: Pharmacia    Given by: Zella Ball Ewing (October 08, 2009 10:21 AM)  Orders Added: 1)  Depo- Medrol 40mg  [J1030] 2)  Depo- Medrol 80mg  [J1040] 3)  Admin of Therapeutic Inj  intramuscular or subcutaneous [96372] 4)  Est. Patient Level IV [84166]

## 2010-08-05 NOTE — Letter (Signed)
Summary: [facility]  [facility]  [address]   [place], [place] [address]   Phone: [phone]  Fax: [phone]       Michelle Solomon    07/24/1960    MRN: [id number]        Procedure Day Dorna Bloom: Wednesday, 04-07-10      Arrival Time: 9:00 a.m.      Procedure Time: 10:00 a.m.     Location of Procedure:                     x  [facility] (4th Floor)    PREPARATION FOR FLEXIBLE SIGMOIDOSCOPY WITH MAGNESIUM CITRATE  Prior to the day before your procedure, purchase one 8 oz. bottle of Magnesium Citrate and one Fleet Enema from the laxative section of your drugstore.  _________________________________________________________________________________________________  THE DAY BEFORE YOUR PROCEDURE             DATE: 04-06-10  DAY:  Tuesday  1.   Have a clear liquid dinner the night before your procedure.  2.   Do not drink anything colored red or purple.  Avoid juices with pulp.  No orange juice.              CLEAR LIQUIDS INCLUDE: Water Jello Ice Popsicles Tea (sugar ok, no milk/cream) Powdered fruit flavored drinks Coffee (sugar ok, no milk/cream) Gatorade Juice: apple, white grape, white cranberry  Lemonade Clear bullion, consomm, broth Carbonated beverages (any kind) Strained chicken noodle soup Hard Candy   3.   At 7:00 pm the night before your procedure, drink one bottle of Magnesium Citrate over ice.  4.   Drink at least 3 more glasses of clear liquids before bedtime (preferably juices).  5.   Results are expected usually within 1 to 6 hours after taking the Magnesium Citrate.  ___________________________________________________________________________________________________  THE DAY OF YOUR PROCEDURE            DATE: 04-07-10     DAY: Wednesday 1.   Use Fleet Enema one hour prior to coming for procedure.  2.   You may drink clear liquids until  8:00 a.m.  (2 hours before exam)       MEDICATION  INSTRUCTIONS  Unless otherwise instructed, you should take regular prescription medications with a small sip of water as early as possible the morning of your procedure.        OTHER INSTRUCTIONS  You will need a responsible adult at least 49 years of age to accompany you and drive you home.   This person must remain in the waiting room during your procedure.  Wear loose fitting clothing that is easily removed.  Leave jewelry and other valuables at home.  However, you may wish to bring a book to read or an iPod/MP3 player to listen to music as you wait for your procedure to start.  Remove all body piercing jewelry and leave at home.  Total time from sign-in until discharge is approximately 2-3 hours.  You should go home directly after your procedure and rest.  You can resume normal activities the day after your procedure.  The day of your procedure you should not:   Drive   Make legal decisions   Operate machinery   Drink alcohol   Return to work  You will receive specific instructions about eating, activities and medications before you leave.   The above instructions have been reviewed and explained to me by   Karl Bales RN  March 29, 2010 10:58 AM    I fully understand and can verbalize these instructions _____________________________ Date _________   Appended Document: [facility] Procedure was changed to 04-08-10 at 1:30 p.m.  Times changed for patient

## 2010-08-05 NOTE — Assessment & Plan Note (Signed)
Summary: CPX /NWS  #   Vital Signs:  Patient profile:   50 year old female Height:      64 inches Weight:      193.75 pounds BMI:     33.38 O2 Sat:      95 % on Room air Temp:     98.4 degrees F oral Pulse rate:   119 / minute BP sitting:   112 / 74  (left arm) Cuff size:   regular  Vitals Entered By: Zella Ball Ewing CMA Duncan Dull) (January 21, 2010 8:46 AM)  O2 Flow:  Room air  Preventive Care Screening  Last Flu Shot:    Date:  04/03/2009    Results:  given   CC: Adult Physical/RE   Primary Care Provider:  Oliver Barre, MD  CC:  Adult Physical/RE.  History of Present Illness: here for yearly f/u;  overall doing well, does have some mild right niipple d/c , clear non bloody and no pain, no specific mass or inflammation;  Pt denies CP, sob, doe, wheezing, orthopnea, pnd, worsening LE edema, palps, dizziness or syncope  Pt denies new neuro symptoms such as headache, facial or extremity weakness   No fever, wt loss, night sweats, loss of appetite or other constitutional symptoms overall good compliacne with meds. good tolerance.  Problems Prior to Update: 1)  Nipple Discharge  (ICD-611.79) 2)  Other Specified Forms of Hearing Loss  (ICD-389.8) 3)  Conjunctivitis, Allergic  (ICD-372.14) 4)  Anal or Rectal Pain  (ICD-569.42) 5)  Special Screening For Malignant Neoplasms Colon  (ICD-V76.51) 6)  Hemorrhoids  (ICD-455.6) 7)  Preventive Health Care  (ICD-V70.0) 8)  Benign Carcinoid Tumor of The Rectum  (ICD-209.57) 9)  Rash-nonvesicular  (ICD-782.1) 10)  Overweight  (ICD-278.02) 11)  Asthma  (ICD-493.90) 12)  Anemia-iron Deficiency  (ICD-280.9) 13)  Allergic Rhinitis  (ICD-477.9)  Medications Prior to Update: 1)  Prednisone 10 Mg Tabs (Prednisone) .... 3po Qd For 3days, Then 2po Qd For 3days, Then 1po Qd For 3days, Then Stop 2)  Fexofenadine Hcl 180 Mg Tabs (Fexofenadine Hcl) .[name] Kitchen.. 1po Once Daily As Needed 3)  Zaditor 0.025 % Soln (Ketotifen Fumarate) .... Use Asd 4)  Fluticasone  Propionate 50 Mcg/act Susp (Fluticasone Propionate) .... 2 Spray/side Once Daily 5)  Advair Diskus 250-50 Mcg/dose Aepb (Fluticasone-Salmeterol) .[name] Kitchen.. 1 Puff Two Times A Day 6)  Proair Hfa 108 (90 Base) Mcg/act Aers (Albuterol Sulfate) .... 2 Puffs Four Times Per Day As Needed Shortness of Breath  Current Medications (verified): 1)  Fexofenadine Hcl 180 Mg Tabs (Fexofenadine Hcl) .[name] Kitchen.. 1po Once Daily As Needed 2)  Zaditor 0.025 % Soln (Ketotifen Fumarate) .... Use Asd 3)  Fluticasone Propionate 50 Mcg/act Susp (Fluticasone Propionate) .... 2 Spray/side Once Daily 4)  Advair Diskus 250-50 Mcg/dose Aepb (Fluticasone-Salmeterol) .[name] Kitchen.. 1 Puff Two Times A Day 5)  Proair Hfa 108 (90 Base) Mcg/act Aers (Albuterol Sulfate) .... 2 Puffs Four Times Per Day As Needed Shortness of Breath  Allergies (verified): No Known Drug Allergies  Past History:  Past Medical History: Last updated: 05/26/2008 Allergic rhinitis Anemia-iron deficiency Asthma hx of rectal carcinoid  Past Surgical History: Last updated: 05/29/2007 Denies surgical history  Family History: Last updated: 05/29/2007 prostate cancer Family History Hypertension sister with allergies  Social History: Last updated: 06/23/2008 Current Smoker - 1 pack weekly Alcohol use-yes Occupation:Customer Service  Daily Caffeine Use -3 Illicit Drug Use - no Patient does not get regular exercise.   Risk Factors: Exercise: no (06/23/2008)  Risk Factors:  Smoking Status: current (05/29/2007)  Review of Systems  The patient denies anorexia, fever, weight loss, weight gain, vision loss, decreased hearing, hoarseness, chest pain, syncope, dyspnea on exertion, peripheral edema, prolonged cough, headaches, hemoptysis, abdominal pain, melena, hematochezia, severe indigestion/heartburn, hematuria, muscle weakness, suspicious skin lesions, transient blindness, difficulty walking, depression, unusual weight change, abnormal bleeding, enlarged lymph  nodes, and angioedema.         all otherwise negative per pt -    Physical Exam  General:  alert and overweight-appearing.   Head:  normocephalic and atraumatic.   Eyes:  vision grossly intact, pupils equal, and pupils round.   Ears:  R ear normal and L ear normal.   Nose:  no external deformity and no nasal discharge.   Mouth:  no gingival abnormalities and pharynx pink and moist.   Neck:  supple and no masses.   Lungs:  normal respiratory effort and normal breath sounds.   Heart:  normal rate and regular rhythm.   Abdomen:  soft, non-tender, and normal bowel sounds.   Msk:  no joint tenderness and no joint swelling.   Extremities:  no edema, no erythema  Neurologic:  cranial nerves II-XII intact and strength normal in all extremities.   Skin:  color normal and no rashes.   Psych:  memory intact for recent and remote and normally interactive.     Impression & Recommendations:  Problem # 1:  Preventive Health Care (ICD-V70.0) Overall doing well, age appropriate education and counseling updated and referral for appropriate preventive services done unless declined, immunizations up to date or declined, diet counseling done if overweight, urged to quit smoking if smokes , most recent labs reviewed and current ordered if appropriate, ecg reviewed or declined (interpretation per ECG scanned in the EMR if done); information regarding Medicare Prevention requirements given if appropriate; speciality referrals updated as appropriate  Orders: EKG w/ Interpretation (93000)  Problem # 2:  NIPPLE DISCHARGE (ICD-611.79)  right side;  for diag mammogram;  also for prolactin level   Orders: TLB-Prolactin (84146-PROL) Radiology Referral (Radiology)  Complete Medication List: 1)  Fexofenadine Hcl 180 Mg Tabs (Fexofenadine hcl) .[name] Kitchen.. 1po once daily as needed 2)  Zaditor 0.025 % Soln (Ketotifen fumarate) .... Use asd 3)  Fluticasone Propionate 50 Mcg/act Susp (Fluticasone propionate) .... 2  spray/side once daily 4)  Advair Diskus 250-50 Mcg/dose Aepb (Fluticasone-salmeterol) .[name] Kitchen.. 1 puff two times a day 5)  Proair Hfa 108 (90 Base) Mcg/act Aers (Albuterol sulfate) .... 2 puffs four times per day as needed shortness of breath  Patient Instructions: 1)  please call for the flexible sigmoidoscopy test 2)  Continue all previous medications as before this visit 3)  You will be contacted about the referral(s) to: Diagnostic mammogram 4)  Please go to the Lab in the basement for your blood  tests today  5)  Please schedule a follow-up appointment in 1 year or sooner if needed Prescriptions: PROAIR HFA 108 (90 BASE) MCG/ACT AERS (ALBUTEROL SULFATE) 2 puffs four times per day as needed shortness of breath  #3 x 3   Entered and Authorized by:   Corwin Levins MD   Signed by:   Corwin Levins MD on 01/21/2010   Method used:   Print then Give to Patient   RxID:   [id number] ADVAIR DISKUS 250-50 MCG/DOSE AEPB (FLUTICASONE-SALMETEROL) 1 puff two times a day  #3 x 3   Entered and Authorized by:   Corwin Levins MD   Signed by:  Corwin Levins MD on 01/21/2010   Method used:   Print then Give to Patient   RxID:   [id number] FLUTICASONE PROPIONATE 50 MCG/ACT SUSP (FLUTICASONE PROPIONATE) 2 spray/side once daily  #3 x 3   Entered and Authorized by:   Corwin Levins MD   Signed by:   Corwin Levins MD on 01/21/2010   Method used:   Print then Give to Patient   RxID:   [id number] FEXOFENADINE HCL 180 MG TABS (FEXOFENADINE HCL) 1po once daily as needed  #180 x 3   Entered and Authorized by:   Corwin Levins MD   Signed by:   Corwin Levins MD on 01/21/2010   Method used:   Print then Give to Patient   RxID:   [id number]

## 2010-11-19 NOTE — Op Note (Signed)
NAME:  Michelle Solomon, Michelle Solomon NO.:  [id number]   MEDICAL RECORD NO.:  [id number]          PATIENT TYPE:  AMB   LOCATION:  SDC                           FACILITY:  [facility]   PHYSICIAN:  Maxie Better, M.D.DATE OF BIRTH:  [birth year]   DATE OF PROCEDURE:  09/21/2004  DATE OF DISCHARGE:                                 OPERATIVE REPORT   PREOPERATIVE DIAGNOSES:  Elective termination, intrauterine gestation at 8  weeks.   PROCEDURE:  Suction dilation and evacuation.   POSTOPERATIVE DIAGNOSES:  Elective termination, intrauterine gestation at 8  weeks.   ANESTHESIA:  MAC, paracervical block.   SURGEON:  Maxie Better, M.D.   DESCRIPTION OF PROCEDURE:  Under adequate monitored anesthesia, the patient  was placed in the dorsal lithotomy position.  Examination under anesthesia  revealed an 8 weeks, retroflexed uterus.  No adnexal masses could be  appreciated.  The patient was sterilely prepped and draped in the usual  fashion.  The bladder was not catheterized, as the patient had voided just  prior to entering the room.  A bivalve speculum was placed in the vagina.  Cervix was noted to be parous with a large and nabothian cyst on the  anterior lip at about the 11 o'clock position.  Nesacaine 21 mL of 1% was  injected paracervically.  The anterior lip of the cervix was grasped with a  single-tooth tenaculum.  The cervix was then serially dilated up to #31  [facility] dilator.  A #8 mm curved suction cannula was introduced into the  uterine cavity.  The cavity was suctioned.  Placental tissue was removed.  The cavity was then curetted and resectioned until all tissue was felt to  have been removed at which time all instruments were then removed from the  vagina.  Specimen labeled products of conception was sent to pathology.  Estimated blood loss was 50 mL.  Complication was none.  The patient  tolerated the procedure well, was transferred to the recovery room in stable  condition.      [place]/MEDQ  D:  09/21/2004  T:  09/21/2004  Job:  [id number]

## 2010-12-23 ENCOUNTER — Telehealth: Payer: Self-pay

## 2010-12-23 NOTE — Telephone Encounter (Signed)
Pt's spouse called requesting referral to [facility] for diagnostic mammogram for pt. Spouse stated pt was having breast pain but did not mention  which breast. I called and Left message on machine for pt to return my call

## 2010-12-23 NOTE — Telephone Encounter (Signed)
Needs OV, as last mammogram was aug 2011, and pain is usuaully not a sign of breast cancer to begin

## 2010-12-23 NOTE — Telephone Encounter (Signed)
Pt advised of MD's recommendation via VM. Told to call back with any further questions or concerns

## 2010-12-23 NOTE — Telephone Encounter (Signed)
Pt called back stating she is having LT Breast pain and is requesting diagnostic mammo at [facility]

## 2010-12-24 ENCOUNTER — Ambulatory Visit (INDEPENDENT_AMBULATORY_CARE_PROVIDER_SITE_OTHER): Payer: BC Managed Care – PPO | Admitting: Internal Medicine

## 2010-12-24 ENCOUNTER — Encounter: Payer: Self-pay | Admitting: Internal Medicine

## 2010-12-24 VITALS — BP 110/78 | HR 114 | Temp 99.1°F | Ht 63.0 in | Wt 182.5 lb

## 2010-12-24 DIAGNOSIS — N61 Mastitis without abscess: Secondary | ICD-10-CM | POA: Insufficient documentation

## 2010-12-24 DIAGNOSIS — Z0001 Encounter for general adult medical examination with abnormal findings: Secondary | ICD-10-CM | POA: Insufficient documentation

## 2010-12-24 DIAGNOSIS — J45909 Unspecified asthma, uncomplicated: Secondary | ICD-10-CM

## 2010-12-24 DIAGNOSIS — Z0000 Encounter for general adult medical examination without abnormal findings: Secondary | ICD-10-CM

## 2010-12-24 DIAGNOSIS — M549 Dorsalgia, unspecified: Secondary | ICD-10-CM

## 2010-12-24 MED ORDER — SULFAMETHOXAZOLE-TRIMETHOPRIM 800-160 MG PO TABS: 1.0000 | ORAL_TABLET | Freq: Two times a day (BID) | ORAL | Status: DC

## 2010-12-24 MED ORDER — SULFAMETHOXAZOLE-TRIMETHOPRIM 800-160 MG PO TABS: 1.0000 | ORAL_TABLET | Freq: Two times a day (BID) | ORAL | Status: AC

## 2010-12-24 NOTE — Assessment & Plan Note (Signed)
Mild recurrent no change in yrs per pt, benign exam, ok to follow for now for any worsening s/s, Continue all other medications as before

## 2010-12-24 NOTE — Progress Notes (Signed)
Subjective:    Patient ID: Michelle Solomon, female    DOB: [birth year], 50 y.o.   MRN: [id number]  HPI pt here to c/o acute onset 2 days left diffuse breast pain, red, swelling, tender, with ill feeling and fever;  Has hx of fibrocystic dz as well as remote abscess in the past;  Had also episode of mastitis sounding problem last yr and followed per surgury but didn't require intervention at that time, tx with antibx. Pt denies chest pain, increased sob or doe, wheezing, orthopnea, PND, increased LE swelling, palpitations, dizziness or syncope. Pt denies new neurological symptoms such as new headache, or facial or extremity weakness or numbness  Pt denies polydipsia, polyuria,  Pt continues to have recurring LBP without change in severity, bowel or bladder change, fever, wt loss,  worsening LE pain/numbness/weakness, gait change or falls. No ST,  Cough, and  Denies urinary symptoms such as dysuria, frequency, urgency,or hematuria.  Past Medical History  Diagnosis Date  . Acute bronchitis 06/06/2010  . ALLERGIC RHINITIS 05/29/2007  . Anal or rectal pain 06/23/2008  . ANEMIA-IRON DEFICIENCY 05/29/2007  . ASTHMA 05/29/2007  . 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 CONJUNCTIVITIS, ALLERGIC 11/27/2008  . HEMORRHOIDS 06/16/2008  . NIPPLE DISCHARGE 01/21/2010  . Other specified forms of hearing loss 10/08/2009  . Overweight 05/29/2007  . RASH-NONVESICULAR 05/29/2007  . Wheezing 06/22/2010   No past surgical history on file.  reports that she has been smoking.  She does not have any smokeless tobacco history on file. She reports that she drinks alcohol. She reports that she does not use illicit drugs. family history includes Allergies in her sister; Cancer in her other; and Hypertension in her other. No Known Allergies Current Outpatient Prescriptions on File Prior to Visit  Medication Sig Dispense Refill  . fexofenadine (ALLEGRA) 180 MG tablet Take 180 mg by mouth daily.        [name] Kitchen albuterol  (PROAIR HFA) 108 (90 BASE) MCG/ACT inhaler Inhale 2 puffs into the lungs 4 (four) times daily.        [name] Kitchen HYDROcodone-homatropine (HYCODAN) 5-1.5 MG/5ML syrup Take by mouth every 6 (six) hours as needed.        [name] Kitchen ketotifen (ZADITOR) 0.025 % ophthalmic solution Use as directed       . levofloxacin (LEVAQUIN) 500 MG tablet Take 500 mg by mouth daily.        . predniSONE (DELTASONE) 10 MG tablet 3 by mouth for 3 days, then 2 by mouth every day for 3 days then 1 by mouth for 3 days then stop        Review of Systems Review of Systems  Constitutional: Negative for diaphoresis and unexpected weight change.  HENT: Negative for drooling and tinnitus.   Eyes: Negative for photophobia and visual disturbance.  Respiratory: Negative for choking and stridor.   Gastrointestinal: Negative for vomiting and blood in stool.  Genitourinary: Negative for hematuria and decreased urine volume.     Objective:   Physical Exam BP 110/78  Pulse 114  Temp(Src) 99.1 F (37.3 C) (Oral)  Ht 5\' 3"  (1.6 m)  Wt 182 lb 8 oz (82.781 kg)  BMI 32.33 kg/m2  SpO2 97%  LMP 12/04/2010 Physical Exam  VS noted, mild ill appaering Constitutional: Pt appears well-developed and well-nourished.  HENT: Head: Normocephalic.  Right Ear: External ear normal.  Left Ear: External ear normal.  Eyes: Conjunctivae and EOM are normal. Pupils are equal, round, and reactive to light.  Neck:  Normal range of motion. Neck supple.  Cardiovascular: Normal rate and regular rhythm.   Pulmonary/Chest: Effort normal and breath sounds normal.  Abd:  Soft, NT, non-distended, + BS Neurological: Pt is alert. No cranial nerve deficit. motor/sens/dtr's intact to LE's; spine nontender Skin: Skin is warm. No erythema.  Psychiatric: Pt behavior is normal. Thought content normal.  Right breast nontender, nonswollen, no mass or d/c Left breast diffusely swollen and mild erythema with mild induration rather large approx 4 cm about the areola, but no  specific mass, fluctuance, drainage or nipple drainage        Assessment & Plan:

## 2010-12-24 NOTE — Assessment & Plan Note (Signed)
stable overall by hx and exam, most recent data reviewed with pt, and pt to continue medical treatment as before  SpO2 Readings from Last 3 Encounters:  12/24/10 97%  06/22/10 96%  06/04/10 98%

## 2010-12-24 NOTE — Patient Instructions (Addendum)
Take all new medications as prescribed - the antibiotic was actually accidentally sent to both [facility] and the [facility] Continue all other medications as before You will be contacted regarding the referral for: left breast ultrasound - see PCC's - for today if possible You will be contacted regarding the referral for: Dr Renie Ora Please return in 3 mo with Lab testing done 3-5 days before

## 2010-12-24 NOTE — Assessment & Plan Note (Signed)
Has known fibrocystic dz, and no definite area of fluctuance, but rather large area of involvement though no drainage or nipple d/c;  For septra ds course, left breast u/s - r/o recurrent abscess, and refer Gen Surgury per pt request for opinion - dr Dwain Sarna

## 2010-12-29 ENCOUNTER — Encounter: Payer: Self-pay | Admitting: Internal Medicine

## 2011-01-07 ENCOUNTER — Ambulatory Visit (INDEPENDENT_AMBULATORY_CARE_PROVIDER_SITE_OTHER): Payer: BC Managed Care – PPO | Admitting: General Surgery

## 2011-01-11 ENCOUNTER — Encounter: Payer: Self-pay | Admitting: Internal Medicine

## 2011-03-25 ENCOUNTER — Ambulatory Visit: Payer: BC Managed Care – PPO | Admitting: Internal Medicine

## 2011-03-29 ENCOUNTER — Ambulatory Visit: Payer: BC Managed Care – PPO | Admitting: Internal Medicine

## 2011-03-31 ENCOUNTER — Ambulatory Visit: Payer: BC Managed Care – PPO | Admitting: Internal Medicine

## 2011-03-31 DIAGNOSIS — Z0289 Encounter for other administrative examinations: Secondary | ICD-10-CM

## 2012-01-16 ENCOUNTER — Encounter: Payer: Self-pay | Admitting: Internal Medicine

## 2012-04-02 ENCOUNTER — Other Ambulatory Visit (INDEPENDENT_AMBULATORY_CARE_PROVIDER_SITE_OTHER): Payer: BC Managed Care – PPO

## 2012-04-02 DIAGNOSIS — Z0000 Encounter for general adult medical examination without abnormal findings: Secondary | ICD-10-CM

## 2012-04-02 LAB — CBC WITH DIFFERENTIAL/PLATELET
Basophils Absolute: 0 10*3/uL (ref 0.0–0.1)
Eosinophils Relative: 3.1 % (ref 0.0–5.0)
MCV: 82.3 fl (ref 78.0–100.0)
Monocytes Absolute: 0.7 10*3/uL (ref 0.1–1.0)
Neutrophils Relative %: 41.3 % — ABNORMAL LOW (ref 43.0–77.0)
Platelets: 276 10*3/uL (ref 150.0–400.0)
WBC: 6.5 10*3/uL (ref 4.5–10.5)

## 2012-04-02 LAB — HEPATIC FUNCTION PANEL
ALT: 12 U/L (ref 0–35)
AST: 15 U/L (ref 0–37)
Albumin: 3.6 g/dL (ref 3.5–5.2)
Alkaline Phosphatase: 60 U/L (ref 39–117)
Total Protein: 6.9 g/dL (ref 6.0–8.3)

## 2012-04-02 LAB — URINALYSIS, ROUTINE W REFLEX MICROSCOPIC
Nitrite: NEGATIVE
Specific Gravity, Urine: 1.025 (ref 1.000–1.030)
Total Protein, Urine: NEGATIVE
pH: 6 (ref 5.0–8.0)

## 2012-04-02 LAB — BASIC METABOLIC PANEL
BUN: 13 mg/dL (ref 6–23)
CO2: 23 mEq/L (ref 19–32)
Chloride: 106 mEq/L (ref 96–112)
Glucose, Bld: 100 mg/dL — ABNORMAL HIGH (ref 70–99)
Potassium: 4.7 mEq/L (ref 3.5–5.1)
Sodium: 138 mEq/L (ref 135–145)

## 2012-04-02 LAB — LIPID PANEL
Cholesterol: 141 mg/dL (ref 0–200)
VLDL: 17.8 mg/dL (ref 0.0–40.0)

## 2012-04-02 LAB — TSH: TSH: 0.85 u[IU]/mL (ref 0.35–5.50)

## 2012-04-05 ENCOUNTER — Ambulatory Visit (INDEPENDENT_AMBULATORY_CARE_PROVIDER_SITE_OTHER): Payer: 59 | Admitting: Internal Medicine

## 2012-04-05 ENCOUNTER — Encounter: Payer: Self-pay | Admitting: Internal Medicine

## 2012-04-05 VITALS — BP 110/82 | HR 95 | Temp 97.9°F | Ht 63.0 in | Wt 182.4 lb

## 2012-04-05 DIAGNOSIS — D509 Iron deficiency anemia, unspecified: Secondary | ICD-10-CM

## 2012-04-05 DIAGNOSIS — Z23 Encounter for immunization: Secondary | ICD-10-CM

## 2012-04-05 DIAGNOSIS — Z0000 Encounter for general adult medical examination without abnormal findings: Secondary | ICD-10-CM

## 2012-04-05 DIAGNOSIS — H9190 Unspecified hearing loss, unspecified ear: Secondary | ICD-10-CM

## 2012-04-05 MED ORDER — VARENICLINE TARTRATE 0.5 MG PO TABS: 0.5000 mg | ORAL_TABLET | Freq: Two times a day (BID) | ORAL | Status: DC

## 2012-04-05 MED ORDER — VARENICLINE TARTRATE 1 MG PO TABS: 1.0000 mg | ORAL_TABLET | Freq: Two times a day (BID) | ORAL | Status: DC

## 2012-04-05 NOTE — Patient Instructions (Addendum)
You had the flu shot today  Please start OTC iron sulfate 325 mg - 1 per day  Please remember to followup with your GYN for the yearly pap smear and/or mammogram, and please mention the fact of persistent iron deficiency anemia, which is likely due to your heavy menses You are the copy of your lab work today Please stop smoking;  You are given the chantix today Please check with GI about the timing of your next colonoscopy Your right ear was irrigated today of wax Continue all other medications as before Please have the pharmacy call with any refills you may need. Please continue your efforts at being more active, low cholesterol diet, and weight control. Please remember to sign up for My Chart at your earliest convenience, as this will be important to you in the future with finding out test results. Please return in 1 year for your yearly visit, or sooner if needed, with Lab testing done 3-5 days before

## 2012-04-05 NOTE — Progress Notes (Signed)
Subjective:    Patient ID: Michelle Solomon, female    DOB: 12/30/1960, 51 y.o.   MRN: [id number]  HPI  Here for wellness and f/u;  Overall doing ok;  Pt denies CP, worsening SOB, DOE, wheezing, orthopnea, PND, worsening LE edema, palpitations, dizziness or syncope.  Pt denies neurological change such as new Headache, facial or extremity weakness.  Pt denies polydipsia, polyuria, or low sugar symptoms. Pt states overall good compliance with treatment and medications, good tolerability, and trying to follow lower cholesterol diet.  Pt denies worsening depressive symptoms, suicidal ideation or panic. No fever, wt loss, night sweats, loss of appetite, or other constitutional symptoms.  Pt states good ability with ADL's, low fall risk, home safety reviewed and adequate, no significant changes in hearing or vision, and occasionally active with exercise.  For flu shot today. Doe smention has been somewhat more forgetful lately but not "serious.'  Wants to quit smoking and asks about chantix.  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 Acute bronchitis 06/06/2010  . ALLERGIC RHINITIS 05/29/2007  . Anal or rectal pain 06/23/2008  . ANEMIA-IRON DEFICIENCY 05/29/2007  . ASTHMA 05/29/2007  . 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 CONJUNCTIVITIS, ALLERGIC 11/27/2008  . HEMORRHOIDS 06/16/2008  . NIPPLE DISCHARGE 01/21/2010  . Other specified forms of hearing loss 10/08/2009  . Overweight 05/29/2007  . RASH-NONVESICULAR 05/29/2007  . Wheezing 06/22/2010   No past surgical history on file.  reports that she has been smoking.  She does not have any smokeless tobacco history on file. She reports that she drinks alcohol. She reports that she does not use illicit drugs. family history includes Allergies in her sister; Cancer in  her other; and Hypertension in her other. No Known Allergies Current Outpatient Prescriptions on File Prior to Visit  Medication Sig Dispense Refill  . albuterol (PROAIR HFA) 108 (90 BASE) MCG/ACT inhaler Inhale 2 puffs into the lungs 4 (four) times daily.        . fexofenadine (ALLEGRA) 180 MG tablet Take 180 mg by mouth daily.        [name] Kitchen ketotifen (ZADITOR) 0.025 % ophthalmic solution Use as directed        Review of Systems Review of Systems  Constitutional: Negative for diaphoresis, activity change, appetite change and unexpected weight change.  HENT: Negative for hearing loss, ear pain, facial swelling, mouth sores and neck stiffness.   Eyes: Negative for pain, redness and visual disturbance.  Respiratory: Negative for shortness of breath and wheezing.   Cardiovascular: Negative for chest pain and palpitations.  Gastrointestinal: Negative for diarrhea, blood in stool, abdominal distention and rectal pain.  Genitourinary: Negative for hematuria, flank pain and decreased urine volume.  Musculoskeletal: Negative for myalgias and joint swelling.  Skin: Negative for color change and wound.  Neurological: Negative for syncope and numbness.  Hematological: Negative for adenopathy.  Psychiatric/Behavioral: Negative for hallucinations, self-injury, decreased concentration and agitation.      Objective:   Physical Exam BP 110/82  Pulse 95  Temp 97.9 F (36.6 C) (Oral)  Ht 5\' 3"  (1.6 m)  Wt 182 lb 6 oz (82.725 kg)  BMI 32.31 kg/m2  SpO2 98% Physical Exam  VS noted Constitutional: Pt is oriented to person, place, and time. Appears well-developed and well-nourished. Michelle Solomon HENT:  Head: Normocephalic and atraumatic.  Right Ear: External ear normal.  Left Ear: External ear normal.  Nose: Nose normal.  Mouth/Throat: Oropharynx is clear and moist.  Bilat tm's erythema.  Sinus nontender.  Pharynx mild erythema Eyes: Conjunctivae and EOM are normal. Pupils are equal, round, and reactive to  light.  Neck: Normal range of motion. Neck supple. No JVD present. No tracheal deviation present.  Cardiovascular: Normal rate, regular rhythm, normal heart sounds and intact distal pulses.   Pulmonary/Chest: Effort normal and breath sounds normal.  Abdominal: Soft. Bowel sounds are normal. There is no tenderness.  Musculoskeletal: Normal range of motion. Exhibits no edema.  Lymphadenopathy:  Has no cervical adenopathy.  Neurological: Pt is alert and oriented to person, place, and time. Pt has normal reflexes. No cranial nerve deficit. Motor/sens/dtr intact Right hearing improved after irrigation Skin: Skin is warm and dry. No rash noted.  Psychiatric:  Has  normal mood and affect. Behavior is normal. except 1+ nervous    Assessment & Plan:

## 2012-04-05 NOTE — Assessment & Plan Note (Signed)

## 2012-04-05 NOTE — Assessment & Plan Note (Signed)
Right side, improved with irrigation

## 2012-04-05 NOTE — Assessment & Plan Note (Signed)
Ongoing persistent mild, to start oral iron, and f/u with GYN regarding heavy menses;  Has had polyps with colonoscoy, may also be getting close to f/u there as well - pt states she will do contact GI to find out

## 2013-06-24 ENCOUNTER — Encounter: Payer: Self-pay | Admitting: Internal Medicine

## 2013-06-24 ENCOUNTER — Ambulatory Visit: Payer: 59 | Admitting: Internal Medicine

## 2013-06-24 ENCOUNTER — Ambulatory Visit (INDEPENDENT_AMBULATORY_CARE_PROVIDER_SITE_OTHER): Payer: 59 | Admitting: Internal Medicine

## 2013-06-24 VITALS — BP 148/100 | HR 96 | Temp 98.3°F | Wt 176.4 lb

## 2013-06-24 DIAGNOSIS — M545 Low back pain, unspecified: Secondary | ICD-10-CM

## 2013-06-24 DIAGNOSIS — S46811A Strain of other muscles, fascia and tendons at shoulder and upper arm level, right arm, initial encounter: Secondary | ICD-10-CM

## 2013-06-24 DIAGNOSIS — G44209 Tension-type headache, unspecified, not intractable: Secondary | ICD-10-CM

## 2013-06-24 DIAGNOSIS — S43499A Other sprain of unspecified shoulder joint, initial encounter: Secondary | ICD-10-CM

## 2013-06-24 DIAGNOSIS — G8911 Acute pain due to trauma: Secondary | ICD-10-CM

## 2013-06-24 DIAGNOSIS — S46819A Strain of other muscles, fascia and tendons at shoulder and upper arm level, unspecified arm, initial encounter: Secondary | ICD-10-CM

## 2013-06-24 MED ORDER — IBUPROFEN 600 MG PO TABS
600.0000 mg | ORAL_TABLET | Freq: Three times a day (TID) | ORAL | Status: DC | PRN
Start: 2013-06-24 — End: 2015-05-12

## 2013-06-24 NOTE — Progress Notes (Signed)
Pre visit review using our clinic review tool, if applicable. No additional management support is needed unless otherwise documented below in the visit note. 
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
## 2014-03-07 ENCOUNTER — Encounter: Payer: Self-pay | Admitting: Internal Medicine

## 2014-12-15 ENCOUNTER — Telehealth: Payer: Self-pay | Admitting: Internal Medicine

## 2014-12-16 ENCOUNTER — Ambulatory Visit: Payer: Self-pay | Admitting: Internal Medicine

## 2014-12-16 ENCOUNTER — Telehealth: Payer: Self-pay | Admitting: Internal Medicine

## 2014-12-16 ENCOUNTER — Encounter: Payer: Self-pay | Admitting: Internal Medicine

## 2014-12-16 ENCOUNTER — Ambulatory Visit (INDEPENDENT_AMBULATORY_CARE_PROVIDER_SITE_OTHER): Payer: 59 | Admitting: Internal Medicine

## 2014-12-16 VITALS — BP 118/74 | HR 95 | Temp 98.4°F | Ht 63.0 in | Wt 194.0 lb

## 2014-12-16 DIAGNOSIS — Z0189 Encounter for other specified special examinations: Secondary | ICD-10-CM

## 2014-12-16 DIAGNOSIS — Z0000 Encounter for general adult medical examination without abnormal findings: Secondary | ICD-10-CM

## 2014-12-16 DIAGNOSIS — F329 Major depressive disorder, single episode, unspecified: Secondary | ICD-10-CM | POA: Insufficient documentation

## 2014-12-16 DIAGNOSIS — F418 Other specified anxiety disorders: Secondary | ICD-10-CM | POA: Diagnosis not present

## 2014-12-16 DIAGNOSIS — F419 Anxiety disorder, unspecified: Secondary | ICD-10-CM

## 2014-12-16 DIAGNOSIS — N644 Mastodynia: Secondary | ICD-10-CM | POA: Diagnosis not present

## 2014-12-16 DIAGNOSIS — F32A Depression, unspecified: Secondary | ICD-10-CM

## 2014-12-16 DIAGNOSIS — Z87891 Personal history of nicotine dependence: Secondary | ICD-10-CM | POA: Insufficient documentation

## 2014-12-16 DIAGNOSIS — Z720 Tobacco use: Secondary | ICD-10-CM

## 2014-12-16 DIAGNOSIS — F17200 Nicotine dependence, unspecified, uncomplicated: Secondary | ICD-10-CM

## 2014-12-16 MED ORDER — ESCITALOPRAM OXALATE 10 MG PO TABS: 10.0000 mg | ORAL_TABLET | Freq: Every day | ORAL | Status: DC

## 2014-12-16 NOTE — Assessment & Plan Note (Signed)
[place] for [employer], by hx seems likely symptomatic cyst dz recurrent

## 2014-12-16 NOTE — Progress Notes (Signed)
Pre visit review using our clinic review tool, if applicable. No additional management support is needed unless otherwise documented below in the visit note. 

## 2014-12-16 NOTE — Patient Instructions (Addendum)
You will be contacted regarding the referral for:  Diagnostic mammogram  Please take all new medication as prescribed - the lexapro  Plesae call if you change your mind about referral for counseling  Please continue all other medications as before, and refills have been done if requested.  Please have the pharmacy call with any other refills you may need.  Please keep your appointments with your specialists as you may have planned  Please return as you prefer, or sooner if needed, with Lab testing done 3-5 days before

## 2014-12-16 NOTE — Assessment & Plan Note (Signed)
To start lexapro 10 qd, declines counsleing, verified no SI or HI, f/u next visit

## 2014-12-16 NOTE — Telephone Encounter (Signed)
Patient was in today with breast pain and you called in an order at [facility]. She was wanting to go to [facility]. She contacted them already and has already made an appointment with them and they are needing the referral from you.

## 2014-12-16 NOTE — Assessment & Plan Note (Signed)
Counseled to quit 

## 2014-12-16 NOTE — Progress Notes (Signed)
Subjective:    Patient ID: Michelle Solomon, female    DOB: Sep 07, 1960, 54 y.o.   MRN: [id number]  HPI  Here with c/o acute onset left breast pain since 4 days, mild , but constant, nothing seems to make better or worse, may have had some redness at the site now resolved, but still tneder though pain somewhat better as swell. No fever, trauma. Last mammogram 2013.  No hx of cancer.  Has hx of right breast cyst and pain with drainage per Dr Isaiah Blakes 2012.  Pt denies chest pain, increased sob or doe, wheezing, orthopnea, PND, increased LE swelling, palpitations, dizziness or syncope.  Denies worsening depressive symptoms, suicidal ideation, or panic; has ongoing anxiety, increased recently with family issues.  Had recent separation. May need counseling, but declines for now.  Still smoking, no  Plans to quit for now, did not try the chantix before Past Medical History  Diagnosis Date  . Acute bronchitis 06/06/2010  . ALLERGIC RHINITIS 05/29/2007  . Anal or rectal pain 06/23/2008  . ANEMIA-IRON DEFICIENCY 05/29/2007  . ASTHMA 05/29/2007  . 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 CONJUNCTIVITIS, ALLERGIC 11/27/2008  . HEMORRHOIDS 06/16/2008  . NIPPLE DISCHARGE 01/21/2010  . Other specified forms of hearing loss 10/08/2009  . Overweight(278.02) 05/29/2007  . RASH-NONVESICULAR 05/29/2007  . Wheezing 06/22/2010   No past surgical history on file.  reports that she has been smoking.  She does not have any smokeless tobacco history on file. She reports that she drinks alcohol. She reports that she does not use illicit drugs. family history includes Allergies in her sister; Cancer in her other; Hypertension in her other. No Known Allergies Current Outpatient Prescriptions on File Prior to Visit  Medication Sig Dispense Refill  . fexofenadine (ALLEGRA) 180 MG tablet Take 180 mg by mouth daily.      [name] Kitchen albuterol (PROAIR HFA) 108 (90 BASE) MCG/ACT inhaler Inhale 2 puffs into the lungs 4 (four)  times daily.      [name] Kitchen ibuprofen (ADVIL,MOTRIN) 600 MG tablet Take 1 tablet (600 mg total) by mouth every 8 (eight) hours as needed. (Patient not taking: Reported on 12/16/2014) 60 tablet 3  . ketotifen (ZADITOR) 0.025 % ophthalmic solution Use as directed     . varenicline (CHANTIX CONTINUING MONTH PAK) 1 MG tablet Take 1 tablet (1 mg total) by mouth 2 (two) times daily. (Patient not taking: Reported on 12/16/2014) 60 tablet 1  . varenicline (CHANTIX) 0.5 MG tablet Take 1 tablet (0.5 mg total) by mouth 2 (two) times daily. (Patient not taking: Reported on 12/16/2014) 60 tablet 0   No current facility-administered medications on file prior to visit.   Review of Systems  Constitutional: Negative for unusual diaphoresis or night sweats HENT: Negative for ringing in ear or discharge Eyes: Negative for double vision or worsening visual disturbance.  Respiratory: Negative for choking and stridor.   Gastrointestinal: Negative for vomiting or other signifcant bowel change Genitourinary: Negative for hematuria or change in urine volume.  Musculoskeletal: Negative for other MSK pain or swelling Skin: Negative for color change and worsening wound.  Neurological: Negative for tremors and numbness other than noted  Psychiatric/Behavioral: Negative for decreased concentration or agitation other than above       Objective:   Physical Exam BP 118/74 mmHg  Pulse 95  Temp(Src) 98.4 F (36.9 C) (Oral)  Ht 5\' 3"  (1.6 m)  Wt 194 lb (87.998 kg)  BMI 34.37 kg/m2  SpO2 97%  LMP 12/02/2014  VS noted,  Constitutional: Pt appears in no significant distress HENT: Head: NCAT.  Right Ear: External ear normal.  Left Ear: External ear normal.  Eyes: . Pupils are equal, round, and reactive to light. Conjunctivae and EOM are normal Neck: Normal range of motion. Neck supple.  Cardiovascular: Normal rate and regular rhythm.   Pulmonary/Chest: Effort normal and breath sounds without rales or wheezing.  Neurological:  Pt is alert. Not confused , motor grossly intact Skin: Skin is warm. No rash, no LE edema Psychiatric: Pt behavior is normal. No agitation.  Breast deferrd per pt    Assessment & Plan:

## 2014-12-17 NOTE — Telephone Encounter (Signed)
Faxed to [facility] @ [phone] per pt request.

## 2014-12-17 NOTE — Telephone Encounter (Signed)
Patient stated that [facility] haven't received your referral, and she has a appointment  tommorow morning at 7:45, she want a copy of the referral, please advise

## 2014-12-25 ENCOUNTER — Other Ambulatory Visit: Payer: Self-pay | Admitting: Radiology

## 2014-12-25 LAB — HM MAMMOGRAPHY: HM Mammogram: 3.8

## 2014-12-30 ENCOUNTER — Encounter: Payer: Self-pay | Admitting: Internal Medicine

## 2014-12-30 LAB — HM MAMMOGRAPHY: HM Mammogram: 2.5

## 2014-12-31 ENCOUNTER — Encounter: Payer: Self-pay | Admitting: Internal Medicine

## 2015-01-01 ENCOUNTER — Encounter: Payer: Self-pay | Admitting: Internal Medicine

## 2015-01-12 ENCOUNTER — Encounter: Payer: Self-pay | Admitting: Internal Medicine

## 2015-01-20 ENCOUNTER — Encounter: Payer: Self-pay | Admitting: Internal Medicine

## 2015-01-22 ENCOUNTER — Encounter: Payer: Self-pay | Admitting: Internal Medicine

## 2015-03-04 ENCOUNTER — Telehealth: Payer: Self-pay | Admitting: Internal Medicine

## 2015-03-04 NOTE — Telephone Encounter (Signed)
Pt has lab orders already entered

## 2015-03-04 NOTE — Telephone Encounter (Signed)
Patient has a CPE appt sept 29 th @ 9:30 she would like labs drawn three days before her visit, please advise

## 2015-03-27 ENCOUNTER — Telehealth: Payer: Self-pay | Admitting: Internal Medicine

## 2015-03-27 NOTE — Telephone Encounter (Signed)
Patient has physical on Thursday and her lab work has expired. Can you put in new orders for her so she can come in on Monday to have them done.

## 2015-03-27 NOTE — Telephone Encounter (Signed)
Pt's labs are open, pt advised

## 2015-03-30 ENCOUNTER — Other Ambulatory Visit (INDEPENDENT_AMBULATORY_CARE_PROVIDER_SITE_OTHER): Payer: 59

## 2015-03-30 DIAGNOSIS — Z0189 Encounter for other specified special examinations: Secondary | ICD-10-CM | POA: Diagnosis not present

## 2015-03-30 DIAGNOSIS — Z0000 Encounter for general adult medical examination without abnormal findings: Secondary | ICD-10-CM

## 2015-03-30 LAB — HEPATIC FUNCTION PANEL
ALT: 9 U/L (ref 0–35)
AST: 13 U/L (ref 0–37)
Albumin: 3.9 g/dL (ref 3.5–5.2)
Alkaline Phosphatase: 75 U/L (ref 39–117)
BILIRUBIN TOTAL: 0.2 mg/dL (ref 0.2–1.2)
Bilirubin, Direct: 0.1 mg/dL (ref 0.0–0.3)
Total Protein: 7 g/dL (ref 6.0–8.3)

## 2015-03-30 LAB — CBC WITH DIFFERENTIAL/PLATELET
BASOS PCT: 0.3 % (ref 0.0–3.0)
Basophils Absolute: 0 10*3/uL (ref 0.0–0.1)
Eosinophils Absolute: 0.3 10*3/uL (ref 0.0–0.7)
Eosinophils Relative: 2.8 % (ref 0.0–5.0)
HEMATOCRIT: 38.6 % (ref 36.0–46.0)
Hemoglobin: 12.4 g/dL (ref 12.0–15.0)
LYMPHS ABS: 3.1 10*3/uL (ref 0.7–4.0)
Lymphocytes Relative: 35.1 % (ref 12.0–46.0)
MCHC: 32.1 g/dL (ref 30.0–36.0)
MCV: 81.7 fl (ref 78.0–100.0)
Monocytes Absolute: 0.8 10*3/uL (ref 0.1–1.0)
Monocytes Relative: 9.1 % (ref 3.0–12.0)
NEUTROS PCT: 52.7 % (ref 43.0–77.0)
Neutro Abs: 4.7 10*3/uL (ref 1.4–7.7)
PLATELETS: 291 10*3/uL (ref 150.0–400.0)
RBC: 4.73 Mil/uL (ref 3.87–5.11)
RDW: 14.4 % (ref 11.5–15.5)
WBC: 8.9 10*3/uL (ref 4.0–10.5)

## 2015-03-30 LAB — URINALYSIS, ROUTINE W REFLEX MICROSCOPIC
BILIRUBIN URINE: NEGATIVE
KETONES UR: NEGATIVE
LEUKOCYTES UA: NEGATIVE
Nitrite: NEGATIVE
RBC / HPF: NONE SEEN (ref 0–?)
Specific Gravity, Urine: 1.02 (ref 1.000–1.030)
Total Protein, Urine: NEGATIVE
UROBILINOGEN UA: 0.2 (ref 0.0–1.0)
Urine Glucose: NEGATIVE
pH: 6 (ref 5.0–8.0)

## 2015-03-30 LAB — LIPID PANEL
CHOLESTEROL: 146 mg/dL (ref 0–200)
HDL: 56.6 mg/dL (ref >39.00)
LDL Cholesterol: 75 mg/dL (ref 0–99)
NonHDL: 88.95
TRIGLYCERIDES: 70 mg/dL (ref 0.0–149.0)
Total CHOL/HDL Ratio: 3
VLDL: 14 mg/dL (ref 0.0–40.0)

## 2015-03-30 LAB — BASIC METABOLIC PANEL
BUN: 10 mg/dL (ref 6–23)
CHLORIDE: 103 meq/L (ref 96–112)
CO2: 28 meq/L (ref 19–32)
Calcium: 9 mg/dL (ref 8.4–10.5)
Creatinine, Ser: 0.62 mg/dL (ref 0.40–1.20)
GFR: 129.61 mL/min (ref >60.00)
Glucose, Bld: 89 mg/dL (ref 70–99)
POTASSIUM: 4 meq/L (ref 3.5–5.1)
Sodium: 139 mEq/L (ref 135–145)

## 2015-03-30 LAB — TSH: TSH: 0.92 u[IU]/mL (ref 0.35–4.50)

## 2015-04-02 ENCOUNTER — Encounter: Payer: 59 | Admitting: Internal Medicine

## 2015-04-30 ENCOUNTER — Encounter: Payer: Self-pay | Admitting: Internal Medicine

## 2015-04-30 ENCOUNTER — Ambulatory Visit (INDEPENDENT_AMBULATORY_CARE_PROVIDER_SITE_OTHER): Payer: 59 | Admitting: Internal Medicine

## 2015-04-30 VITALS — BP 114/74 | HR 84 | Temp 98.3°F | Ht 63.0 in | Wt 182.0 lb

## 2015-04-30 DIAGNOSIS — Z0000 Encounter for general adult medical examination without abnormal findings: Secondary | ICD-10-CM

## 2015-04-30 DIAGNOSIS — M25511 Pain in right shoulder: Secondary | ICD-10-CM | POA: Diagnosis not present

## 2015-04-30 NOTE — Progress Notes (Signed)
Pre visit review using our clinic review tool, if applicable. No additional management support is needed unless otherwise documented below in the visit note. 

## 2015-04-30 NOTE — Assessment & Plan Note (Addendum)

## 2015-04-30 NOTE — Assessment & Plan Note (Signed)
With possible right rotater cuff dz - pt to make appt with Dr Smith/sport med

## 2015-04-30 NOTE — Patient Instructions (Addendum)
Your EKG was OK today  Please continue all other medications as before, and refills have been done if requested.  Please have the pharmacy call with any other refills you may need.  Please continue your efforts at being more active, low cholesterol diet, and weight control.  You are otherwise up to date with prevention measures today.  Please keep your appointments with your specialists as you may have planned  Plesae make appt with Dr Smith/sport med for the right shoulder  Please return in 1 year for your yearly visit, or sooner if needed, with Lab testing done 3-5 days before

## 2015-04-30 NOTE — Progress Notes (Signed)
Subjective:    Patient ID: Michelle Solomon, female    DOB: 11-03-1960, 54 y.o.   MRN: [id number]  HPI  Here for wellness and f/u;  Overall doing ok;  Pt denies Chest pain, worsening SOB, DOE, wheezing, orthopnea, PND, worsening LE edema, palpitations, dizziness or syncope.  Pt denies neurological change such as new headache, facial or extremity weakness.  Pt denies polydipsia, polyuria, or low sugar symptoms. Pt states overall good compliance with treatment and medications, good tolerability, and has been trying to follow appropriate diet.  Pt denies worsening depressive symptoms, suicidal ideation or panic. No fever, night sweats, wt loss, loss of appetite, or other constitutional symptoms.  Pt states good ability with ADL's, has low fall risk, home safety reviewed and adequate, no other significant changes in hearing or vision, and only occasionally active with exercise. Declines flu shot for now.  Does have right shoulder pain for > 1 mo, with inabiltity to raise overhead Past Medical History  Diagnosis Date  . Acute bronchitis 06/06/2010  . ALLERGIC RHINITIS 05/29/2007  . Anal or rectal pain 06/23/2008  . ANEMIA-IRON DEFICIENCY 05/29/2007  . ASTHMA 05/29/2007  . 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 CONJUNCTIVITIS, ALLERGIC 11/27/2008  . HEMORRHOIDS 06/16/2008  . NIPPLE DISCHARGE 01/21/2010  . Other specified forms of hearing loss 10/08/2009  . Overweight(278.02) 05/29/2007  . RASH-NONVESICULAR 05/29/2007  . Wheezing 06/22/2010   No past surgical history on file.  reports that she has been smoking.  She does not have any smokeless tobacco history on file. She reports that she drinks alcohol. She reports that she does not use illicit drugs. family history includes Allergies in her sister; Cancer in her other; Hypertension in her other. No Known Allergies Current Outpatient Prescriptions on File Prior to Visit  Medication Sig Dispense Refill  . fexofenadine (ALLEGRA) 180 MG  tablet Take 180 mg by mouth daily.      [name] Kitchen albuterol (PROAIR HFA) 108 (90 BASE) MCG/ACT inhaler Inhale 2 puffs into the lungs 4 (four) times daily.      [name] Kitchen escitalopram (LEXAPRO) 10 MG tablet Take 1 tablet (10 mg total) by mouth daily. 90 tablet 3  . ibuprofen (ADVIL,MOTRIN) 600 MG tablet Take 1 tablet (600 mg total) by mouth every 8 (eight) hours as needed. (Patient not taking: Reported on 12/16/2014) 60 tablet 3  . ketotifen (ZADITOR) 0.025 % ophthalmic solution Use as directed     . varenicline (CHANTIX CONTINUING MONTH PAK) 1 MG tablet Take 1 tablet (1 mg total) by mouth 2 (two) times daily. (Patient not taking: Reported on 12/16/2014) 60 tablet 1  . varenicline (CHANTIX) 0.5 MG tablet Take 1 tablet (0.5 mg total) by mouth 2 (two) times daily. (Patient not taking: Reported on 12/16/2014) 60 tablet 0   No current facility-administered medications on file prior to visit.    Review of Systems Constitutional: Negative for increased diaphoresis, other activity, appetite or siginficant weight change other than noted HENT: Negative for worsening hearing loss, ear pain, facial swelling, mouth sores and neck stiffness.   Eyes: Negative for other worsening pain, redness or visual disturbance.  Respiratory: Negative for shortness of breath and wheezing  Cardiovascular: Negative for chest pain and palpitations.  Gastrointestinal: Negative for diarrhea, blood in stool, abdominal distention or other pain Genitourinary: Negative for hematuria, flank pain or change in urine volume.  Musculoskeletal: Negative for myalgias or other joint complaints.  Skin: Negative for color change and wound or drainage.  Neurological: Negative for  syncope and numbness. other than noted Hematological: Negative for adenopathy. or other swelling Psychiatric/Behavioral: Negative for hallucinations, SI, self-injury, decreased concentration or other worsening agitation.      Objective:   Physical Exam BP 114/74 mmHg  Pulse 84   Temp(Src) 98.3 F (36.8 C) (Oral)  Ht 5\' 3"  (1.6 m)  Wt 182 lb (82.555 kg)  BMI 32.25 kg/m2  SpO2 97%  LMP 04/08/2015 VS noted,  Constitutional: Pt is oriented to person, place, and time. Appears well-developed and well-nourished, in no significant distress Head: Normocephalic and atraumatic.  Right Ear: External ear normal.  Left Ear: External ear normal.  Nose: Nose normal.  Mouth/Throat: Oropharynx is clear and moist.  Eyes: Conjunctivae and EOM are normal. Pupils are equal, round, and reactive to light.  Neck: Normal range of motion. Neck supple. No JVD present. No tracheal deviation present or significant neck LA or mass Cardiovascular: Normal rate, regular rhythm, normal heart sounds and intact distal pulses.   Pulmonary/Chest: Effort normal and breath sounds without rales or wheezing  Abdominal: Soft. Bowel sounds are normal. NT. No HSM  Musculoskeletal: Normal range of motion. Exhibits no edema.  Lymphadenopathy:  Has no cervical adenopathy.  Neurological: Pt is alert and oriented to person, place, and time. Pt has normal reflexes. No cranial nerve deficit. Motor grossly intact Skin: Skin is warm and dry. No rash noted.  Psychiatric:  Has normal mood and affect. Behavior is normal.     Assessment & Plan:

## 2015-05-12 ENCOUNTER — Encounter: Payer: Self-pay | Admitting: Family Medicine

## 2015-05-12 ENCOUNTER — Other Ambulatory Visit (INDEPENDENT_AMBULATORY_CARE_PROVIDER_SITE_OTHER): Payer: 59

## 2015-05-12 ENCOUNTER — Ambulatory Visit (INDEPENDENT_AMBULATORY_CARE_PROVIDER_SITE_OTHER)
Admission: RE | Admit: 2015-05-12 | Discharge: 2015-05-12 | Disposition: A | Discharge status: Home / Self Care | Payer: 59 | Source: Ambulatory Visit | Attending: Family Medicine | Admitting: Family Medicine

## 2015-05-12 ENCOUNTER — Ambulatory Visit (INDEPENDENT_AMBULATORY_CARE_PROVIDER_SITE_OTHER): Payer: 59 | Admitting: Family Medicine

## 2015-05-12 VITALS — BP 136/84 | HR 105 | Ht 63.0 in | Wt 187.0 lb

## 2015-05-12 DIAGNOSIS — M7501 Adhesive capsulitis of right shoulder: Secondary | ICD-10-CM

## 2015-05-12 DIAGNOSIS — M25511 Pain in right shoulder: Secondary | ICD-10-CM

## 2015-05-12 DIAGNOSIS — M7500 Adhesive capsulitis of unspecified shoulder: Secondary | ICD-10-CM | POA: Insufficient documentation

## 2015-05-12 NOTE — Patient Instructions (Addendum)
Good to see you.  Ice 20 minutes 2 times daily. Usually after activity and before bed. Exercises 3 times a week.  pennsaid pinkie amount topically 2 times daily as needed.  Vitamin D 2000 IU daily  Iron 325mg  daily                         See me again in 3-4 weeks.

## 2015-05-12 NOTE — Progress Notes (Signed)
Pre visit review using our clinic review tool, if applicable. No additional management support is needed unless otherwise documented below in the visit note. 

## 2015-05-12 NOTE — Progress Notes (Signed)
[facility] [address] [place], [place] [address] Phone: [phone] Subjective:    I'm seeing this patient by the request  of:  Cathlean Cower, MD   CC: Right shoulder pain after fall  [id number] Michelle Solomon is a 54 y.o. female coming in with complaint of right shoulder pain. Been proximal to 4 weeks. Does remember falling one time on the shoulder. Patient states since then she's had a dull, throbbing aching pain in the shoulder. States that certain movements such as overhead can be difficulty. Patient states reach behind her back has become difficult such as putting on her bra. Patient states laying on that side is hurtful. Pain seems to stay localized with no radiation. Denies any significant weakness. Has not been using the arm as frequently. Some immediate decrease in range of motion recently. Starting affects some daily activities and rates the severity of pain is 8 out of 10. Has tried over-the-counter anti-inflammatories with no significant benefit.  Past Medical History  Diagnosis Date  . Acute bronchitis 06/06/2010  . ALLERGIC RHINITIS 05/29/2007  . Anal or rectal pain 06/23/2008  . ANEMIA-IRON DEFICIENCY 05/29/2007  . ASTHMA 05/29/2007  . 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 CONJUNCTIVITIS, ALLERGIC 11/27/2008  . HEMORRHOIDS 06/16/2008  . NIPPLE DISCHARGE 01/21/2010  . Other specified forms of hearing loss 10/08/2009  . Overweight(278.02) 05/29/2007  . RASH-NONVESICULAR 05/29/2007  . Wheezing 06/22/2010   No past surgical history on file. Social History  Substance Use Topics  . Smoking status: Current Every Day Smoker  . Smokeless tobacco: None     Comment: 1 pack weekly  . Alcohol Use: Yes   No Known Allergies Family History  Problem Relation Age of Onset  . Allergies Sister   . Cancer Other     prostate cancer  . Hypertension Other         Past medical history, social, surgical and family history all reviewed in  electronic medical record.   Review of Systems: No headache, visual changes, nausea, vomiting, diarrhea, constipation, dizziness, abdominal pain, skin rash, fevers, chills, night sweats, weight loss, swollen lymph nodes, body aches, joint swelling, muscle aches, chest pain, shortness of breath, mood changes.   Objective Blood pressure 136/84, pulse 105, height 5\' 3"  (1.6 m), weight 187 lb (84.823 kg), last menstrual period 04/08/2015, SpO2 99 %.  General: No apparent distress alert and oriented x3 mood and affect normal, dressed appropriately.  HEENT: Pupils equal, extraocular movements intact  Respiratory: Patient's speak in full sentences and does not appear short of breath  Cardiovascular: No lower extremity edema, non tender, no erythema  Skin: Warm dry intact with no signs of infection or rash on extremities or on axial skeleton.  Abdomen: Soft nontender  Neuro: Cranial nerves II through XII are intact, neurovascularly intact in all extremities with 2+ DTRs and 2+ pulses.  Lymph: No lymphadenopathy of posterior or anterior cervical chain or axillae bilaterally.  Gait normal with good balance and coordination.  MSK:  Non tender with full range of motion and good stability and symmetric strength and tone of  elbows, wrist, hip, knee and ankles bilaterally.  Shoulder: Right Inspection reveals no abnormalities, atrophy or asymmetry. Palpation is normal with no tenderness over AC joint or bicipital groove. Patient lacks last 10 of forefoot flexion, 25 of external rotation and only internal rotation to sacrum Rotator cuff strength normal throughout. signs of impingement with positive Neer and Hawkin's tests, but negative empty can  sign. Speeds and Yergason's tests normal. No labral pathology noted with negative Obrien's, negative clunk and good stability. Normal scapular function observed. No painful arc and no drop arm sign. No apprehension sign Contralateral shoulder unremarkable  MSK  US performed of: Right This study was ordered, performed, and interpreted by Charlann Boxer D.O.  Shoulder:   Supraspinatus:  Appears normal on long and transverse views, Bursal bulge seen with shoulder abduction on impingement view. Patient does have significant thickening of the anterior capsule Infraspinatus:  Appears normal on long and transverse views. Significant increase in Doppler flow Subscapularis:  Appears normal on long and transverse views. Positive bursa Teres Minor:  Appears normal on long and transverse views. AC joint:  Capsule undistended, no geyser sign. Minimal osteoarthritic changes Glenohumeral Joint:  Appears normal without effusion. Thickening of the posterior capsule noted Glenoid Labrum:  Intact without visualized tears. Biceps Tendon:  Appears normal on long and transverse views, no fraying of tendon, tendon located in intertubercular groove, no subluxation with shoulder internal or external rotation.  Impression: Subacromial bursitis, rotator cuff syndrome as well as early frozen shoulder  Procedure: Real-time Ultrasound Guided Injection of right glenohumeral joint Device: GE Logiq E  Ultrasound guided injection is preferred based studies that show increased duration, increased effect, greater accuracy, decreased procedural pain, increased response rate with ultrasound guided versus blind injection.  Verbal informed consent obtained.  Time-out conducted.  Noted no overlying erythema, induration, or other signs of local infection.  Skin prepped in a sterile fashion.  Local anesthesia: Topical Ethyl chloride.  With sterile technique and under real time ultrasound guidance:  Joint visualized.  23g 1  inch needle inserted posterior approach. Pictures taken for needle placement. Patient did have injection of 2 cc of 1% lidocaine, 2 cc of 0.5% Marcaine, and 1.0 cc of Kenalog 40 mg/dL. Completed without difficulty  Pain immediately resolved suggesting accurate placement of  the medication.  Advised to call if fevers/chills, erythema, induration, drainage, or persistent bleeding.  Images permanently stored and available for review in the ultrasound unit.  Impression: Technically successful ultrasound guided injection.  Procedure note 34193; 15 minutes spent for Therapeutic exercises as stated in above notes.  This included exercises focusing on stretching, strengthening, with significant focus on eccentric aspects. Basic scapular stabilization to include adduction and depression of scapula Scaption, focusing on proper movement and good control Internal and External rotation utilizing a theraband, with elbow tucked at side entire time Rows with theraband  Proper technique shown and discussed handout in great detail with ATC.  All questions were discussed and answered.       Impression and Recommendations:     This case required medical decision making of moderate complexity.

## 2015-05-12 NOTE — Assessment & Plan Note (Signed)
Patient was given an injection today with complete resolution of pain but continued to have limited range of motion. I do think that this is frozen shoulder. We discussed prognosis, icing, home exercises and patient work with Product/process development scientist. Patient given topical anti-inflammatories. We will get x-ray with patient stating that this did start after a traumatic event. Patient and will come back and see me again in 3-4 weeks. If patient continues to have trouble she may be a candidate for formal physical therapy. We may also have to repeat steroid injection.

## 2015-06-02 ENCOUNTER — Encounter: Payer: Self-pay | Admitting: Family Medicine

## 2015-06-02 ENCOUNTER — Ambulatory Visit (INDEPENDENT_AMBULATORY_CARE_PROVIDER_SITE_OTHER): Payer: 59 | Admitting: Family Medicine

## 2015-06-02 VITALS — BP 122/84 | HR 90 | Ht 63.0 in | Wt 185.0 lb

## 2015-06-02 DIAGNOSIS — M7501 Adhesive capsulitis of right shoulder: Secondary | ICD-10-CM

## 2015-06-02 MED ORDER — MELOXICAM 15 MG PO TABS
15.0000 mg | ORAL_TABLET | Freq: Every day | ORAL | Status: DC
Start: 2015-06-02 — End: 2017-09-06

## 2015-06-02 NOTE — Assessment & Plan Note (Signed)
Given an anti-inflammatory, encourage continuing home exercise program, declined formal physical therapy. Repeat injection. Patient should hopefully respond well to conservative therapy and patient will come back in 4-6 weeks for further evaluation and treatment.

## 2015-06-02 NOTE — Progress Notes (Signed)
Pre visit review using our clinic review tool, if applicable. No additional management support is needed unless otherwise documented below in the visit note. 

## 2015-06-02 NOTE — Progress Notes (Signed)
[facility] [address] [place], [place] [address] Phone: [phone] Subjective:    I'm seeing this patient by the request  of:  Cathlean Cower, MD   CC: Right shoulder pain after fall  [id number] Michelle Solomon is a 54 y.o. female coming in with complaint of right shoulder pain. Patient was found to have more of a frozen shoulder. Some mild subacromial bursitis. Had an injection. States that she has increased her range of motion but continues to have the pain. Denies any weakness or any radiation down the arm. Overall it states that she is 25-35% better. Continues to have pain. Pain can wake her up at night as well.  Past Medical History  Diagnosis Date  . Acute bronchitis 06/06/2010  . ALLERGIC RHINITIS 05/29/2007  . Anal or rectal pain 06/23/2008  . ANEMIA-IRON DEFICIENCY 05/29/2007  . ASTHMA 05/29/2007  . 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 CONJUNCTIVITIS, ALLERGIC 11/27/2008  . HEMORRHOIDS 06/16/2008  . NIPPLE DISCHARGE 01/21/2010  . Other specified forms of hearing loss 10/08/2009  . Overweight(278.02) 05/29/2007  . RASH-NONVESICULAR 05/29/2007  . Wheezing 06/22/2010   No past surgical history on file. Social History  Substance Use Topics  . Smoking status: Current Every Day Smoker  . Smokeless tobacco: None     Comment: 1 pack weekly  . Alcohol Use: Yes   No Known Allergies Family History  Problem Relation Age of Onset  . Allergies Sister   . Cancer Other     prostate cancer  . Hypertension Other         Past medical history, social, surgical and family history all reviewed in electronic medical record.   Review of Systems: No headache, visual changes, nausea, vomiting, diarrhea, constipation, dizziness, abdominal pain, skin rash, fevers, chills, night sweats, weight loss, swollen lymph nodes, body aches, joint swelling, muscle aches, chest pain, shortness of breath, mood changes.   Objective Blood pressure 122/84,  pulse 90, weight 185 lb (83.915 kg), last menstrual period 05/12/2015, SpO2 98 %.  General: No apparent distress alert and oriented x3 mood and affect normal, dressed appropriately.  HEENT: Pupils equal, extraocular movements intact  Respiratory: Patient's speak in full sentences and does not appear short of breath  Cardiovascular: No lower extremity edema, non tender, no erythema  Skin: Warm dry intact with no signs of infection or rash on extremities or on axial skeleton.  Abdomen: Soft nontender  Neuro: Cranial nerves II through XII are intact, neurovascularly intact in all extremities with 2+ DTRs and 2+ pulses.  Lymph: No lymphadenopathy of posterior or anterior cervical chain or axillae bilaterally.  Gait normal with good balance and coordination.  MSK:  Non tender with full range of motion and good stability and symmetric strength and tone of  elbows, wrist, hip, knee and ankles bilaterally.  Shoulder: Right Inspection reveals no abnormalities, atrophy or asymmetry. Palpation is normal with no tenderness over AC joint or bicipital groove. Patient lacks last 10 of forward flexion, 25 of external rotation and only internal rotation to L5 which is a minimal improvement. Rotator cuff strength normal throughout. signs of impingement with positive Neer and Hawkin's tests, but negative empty can sign. Speeds and Yergason's tests normal. No labral pathology noted with negative Obrien's, negative clunk and good stability. Normal scapular function observed. No painful arc and no drop arm sign. No apprehension sign Contralateral shoulder unremarkable    Procedure: Real-time Ultrasound Guided Injection of right glenohumeral joint Device: GE  Logiq E  Ultrasound guided injection is preferred based studies that show increased duration, increased effect, greater accuracy, decreased procedural pain, increased response rate with ultrasound guided versus blind injection.  Verbal informed consent  obtained.  Time-out conducted.  Noted no overlying erythema, induration, or other signs of local infection.  Skin prepped in a sterile fashion.  Local anesthesia: Topical Ethyl chloride.  With sterile technique and under real time ultrasound guidance:  Joint visualized.  23g 1  inch needle inserted posterior approach. Pictures taken for needle placement. Patient did have injection of 2 cc of 1% lidocaine, 2 cc of 0.5% Marcaine, and 1.0 cc of Kenalog 40 mg/dL. Completed without difficulty  Pain immediately resolved suggesting accurate placement of the medication.  Advised to call if fevers/chills, erythema, induration, drainage, or persistent bleeding.  Images permanently stored and available for review in the ultrasound unit.  Impression: Technically successful ultrasound guided injection.       Impression and Recommendations:     This case required medical decision making of moderate complexity.

## 2015-06-02 NOTE — Patient Instructions (Signed)
Great to see you Meloxicam daily for 10 days then as needed One more injection today should help Keep up with the exercises Ice is your friend Happy holidays!  See me again in 4-6 weeks

## 2015-07-10 ENCOUNTER — Ambulatory Visit: Payer: 59

## 2015-07-14 ENCOUNTER — Ambulatory Visit: Payer: 59 | Admitting: Family Medicine

## 2015-07-14 ENCOUNTER — Ambulatory Visit: Payer: 59

## 2015-08-14 ENCOUNTER — Encounter: Payer: Self-pay | Admitting: Gastroenterology

## 2016-05-03 ENCOUNTER — Encounter: Payer: 59 | Admitting: Internal Medicine

## 2016-05-03 DIAGNOSIS — Z0289 Encounter for other administrative examinations: Secondary | ICD-10-CM

## 2016-06-03 ENCOUNTER — Telehealth: Payer: Self-pay | Admitting: Emergency Medicine

## 2016-06-03 NOTE — Telephone Encounter (Signed)
error 

## 2016-06-10 ENCOUNTER — Other Ambulatory Visit (INDEPENDENT_AMBULATORY_CARE_PROVIDER_SITE_OTHER): Payer: 59

## 2016-06-10 ENCOUNTER — Ambulatory Visit (INDEPENDENT_AMBULATORY_CARE_PROVIDER_SITE_OTHER): Payer: 59 | Admitting: Internal Medicine

## 2016-06-10 ENCOUNTER — Encounter: Payer: Self-pay | Admitting: Internal Medicine

## 2016-06-10 VITALS — BP 134/74 | HR 98 | Temp 98.3°F | Resp 20 | Wt 194.0 lb

## 2016-06-10 DIAGNOSIS — Z1159 Encounter for screening for other viral diseases: Secondary | ICD-10-CM | POA: Diagnosis not present

## 2016-06-10 DIAGNOSIS — Z0001 Encounter for general adult medical examination with abnormal findings: Secondary | ICD-10-CM

## 2016-06-10 DIAGNOSIS — L709 Acne, unspecified: Secondary | ICD-10-CM | POA: Insufficient documentation

## 2016-06-10 LAB — LIPID PANEL
Cholesterol: 165 mg/dL (ref 0–200)
HDL: 55.3 mg/dL (ref >39.00)
LDL CALC: 97 mg/dL (ref 0–99)
NonHDL: 109.74
Total CHOL/HDL Ratio: 3
Triglycerides: 64 mg/dL (ref 0.0–149.0)
VLDL: 12.8 mg/dL (ref 0.0–40.0)

## 2016-06-10 LAB — BASIC METABOLIC PANEL
BUN: 10 mg/dL (ref 6–23)
CALCIUM: 9.4 mg/dL (ref 8.4–10.5)
CO2: 32 mEq/L (ref 19–32)
Chloride: 105 mEq/L (ref 96–112)
Creatinine, Ser: 0.64 mg/dL (ref 0.40–1.20)
GFR: 124.38 mL/min (ref >60.00)
Glucose, Bld: 115 mg/dL — ABNORMAL HIGH (ref 70–99)
Potassium: 3.9 mEq/L (ref 3.5–5.1)
SODIUM: 142 meq/L (ref 135–145)

## 2016-06-10 LAB — HEPATIC FUNCTION PANEL
ALBUMIN: 4.1 g/dL (ref 3.5–5.2)
ALK PHOS: 77 U/L (ref 39–117)
ALT: 9 U/L (ref 0–35)
AST: 12 U/L (ref 0–37)
Bilirubin, Direct: 0.1 mg/dL (ref 0.0–0.3)
Total Bilirubin: 0.4 mg/dL (ref 0.2–1.2)
Total Protein: 7.5 g/dL (ref 6.0–8.3)

## 2016-06-10 LAB — URINALYSIS, ROUTINE W REFLEX MICROSCOPIC
BILIRUBIN URINE: NEGATIVE
KETONES UR: NEGATIVE
LEUKOCYTES UA: NEGATIVE
NITRITE: NEGATIVE
Specific Gravity, Urine: 1.025 (ref 1.000–1.030)
Total Protein, Urine: NEGATIVE
Urine Glucose: NEGATIVE
Urobilinogen, UA: 0.2 (ref 0.0–1.0)
WBC UA: NONE SEEN (ref 0–?)
pH: 6 (ref 5.0–8.0)

## 2016-06-10 LAB — CBC WITH DIFFERENTIAL/PLATELET
Basophils Absolute: 0 10*3/uL (ref 0.0–0.1)
Basophils Relative: 0.4 % (ref 0.0–3.0)
EOS PCT: 2 % (ref 0.0–5.0)
Eosinophils Absolute: 0.1 10*3/uL (ref 0.0–0.7)
HEMATOCRIT: 38.7 % (ref 36.0–46.0)
HEMOGLOBIN: 12.6 g/dL (ref 12.0–15.0)
LYMPHS ABS: 2.8 10*3/uL (ref 0.7–4.0)
LYMPHS PCT: 38.7 % (ref 12.0–46.0)
MCHC: 32.7 g/dL (ref 30.0–36.0)
MCV: 81.2 fl (ref 78.0–100.0)
MONOS PCT: 10 % (ref 3.0–12.0)
Monocytes Absolute: 0.7 10*3/uL (ref 0.1–1.0)
Neutro Abs: 3.6 10*3/uL (ref 1.4–7.7)
Neutrophils Relative %: 48.9 % (ref 43.0–77.0)
Platelets: 314 10*3/uL (ref 150.0–400.0)
RBC: 4.76 Mil/uL (ref 3.87–5.11)
RDW: 14.6 % (ref 11.5–15.5)
WBC: 7.4 10*3/uL (ref 4.0–10.5)

## 2016-06-10 LAB — TSH: TSH: 0.85 u[IU]/mL (ref 0.35–4.50)

## 2016-06-10 MED ORDER — DOXYCYCLINE HYCLATE 100 MG PO TABS: 100.0000 mg | ORAL_TABLET | Freq: Two times a day (BID) | ORAL | 1 refills | Status: DC

## 2016-06-10 MED ORDER — DOXYCYCLINE HYCLATE 100 MG PO TABS: 100.0000 mg | ORAL_TABLET | Freq: Two times a day (BID) | ORAL | 0 refills | Status: DC

## 2016-06-10 NOTE — Assessment & Plan Note (Signed)
Mild to mod, for antibx course,  to f/u any worsening symptoms or concerns 

## 2016-06-10 NOTE — Patient Instructions (Addendum)
Please take all new medication as prescribed - the antibiotic  Please continue all other medications as before, and refills have been done if requested.  Please have the pharmacy call with any other refills you may need.  Please continue your efforts at being more active, low cholesterol diet, and weight control.  You are otherwise up to date with prevention measures today.  Please keep your appointments with your specialists as you may have planned  You will be contacted regarding the referral for: colonoscopy  Please go to the LAB in the Basement (turn left off the elevator) for the tests to be done today  You will be contacted by phone if any changes need to be made immediately.  Otherwise, you will receive a letter about your results with an explanation, but please check with MyChart first.  Please remember to sign up for MyChart if you have not done so, as this will be important to you in the future with finding out test results, communicating by private email, and scheduling acute appointments online when needed.  Please return in 1 year for your yearly visit, or sooner if needed, with Lab testing done 3-5 days before  

## 2016-06-10 NOTE — Progress Notes (Signed)
Pre visit review using our clinic review tool, if applicable. No additional management support is needed unless otherwise documented below in the visit note. 

## 2016-06-10 NOTE — Assessment & Plan Note (Signed)

## 2016-06-10 NOTE — Progress Notes (Signed)
Subjective:    Patient ID: Michelle Solomon, female    DOB: March 28, 1961, 55 y.o.   MRN: [id number]  HPI  Here for wellness and f/u;  Overall doing ok;  Pt denies Chest pain, worsening SOB, DOE, wheezing, orthopnea, PND, worsening LE edema, palpitations, dizziness or syncope.  Pt denies neurological change such as new headache, facial or extremity weakness.  Pt denies polydipsia, polyuria, or low sugar symptoms. Pt states overall good compliance with treatment and medications, good tolerability, and has been trying to follow appropriate diet.  Pt denies worsening depressive symptoms, suicidal ideation or panic. No fever, night sweats, wt loss, loss of appetite, or other constitutional symptoms.  Pt states good ability with ADL's, has low fall risk, home safety reviewed and adequate, no other significant changes in hearing or vision, and only occasionally active with exercise.  Declines flu shot.  Has some worsening mild symptomatic acneform changes to face in the past wk, asks for antibx as doxy has worked in the past.  No other new history Past Medical History:  Diagnosis Date  . Acute bronchitis 06/06/2010  . ALLERGIC RHINITIS 05/29/2007  . Anal or rectal pain 06/23/2008  . ANEMIA-IRON DEFICIENCY 05/29/2007  . ASTHMA 05/29/2007  . 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 CONJUNCTIVITIS, ALLERGIC 11/27/2008  . HEMORRHOIDS 06/16/2008  . NIPPLE DISCHARGE 01/21/2010  . Other specified forms of hearing loss 10/08/2009  . Overweight(278.02) 05/29/2007  . RASH-NONVESICULAR 05/29/2007  . Wheezing 06/22/2010   No past surgical history on file.  reports that she has been smoking.  She does not have any smokeless tobacco history on file. She reports that she drinks alcohol. She reports that she does not use drugs. family history includes Allergies in her sister; Cancer in her other; Hypertension in her other. No Known Allergies Current Outpatient Prescriptions on File Prior to Visit  Medication  Sig Dispense Refill  . albuterol (PROAIR HFA) 108 (90 BASE) MCG/ACT inhaler Inhale 2 puffs into the lungs 4 (four) times daily.      . fexofenadine (ALLEGRA) 180 MG tablet Take 180 mg by mouth daily.      [name] Kitchen ketotifen (ZADITOR) 0.025 % ophthalmic solution Use as directed     . meloxicam (MOBIC) 15 MG tablet Take 1 tablet (15 mg total) by mouth daily. 30 tablet 0  . escitalopram (LEXAPRO) 10 MG tablet Take 1 tablet (10 mg total) by mouth daily. 90 tablet 3   No current facility-administered medications on file prior to visit.    Review of Systems Constitutional: Negative for increased diaphoresis, or other activity, appetite or siginficant weight change other than noted HENT: Negative for worsening hearing loss, ear pain, facial swelling, mouth sores and neck stiffness.   Eyes: Negative for other worsening pain, redness or visual disturbance.  Respiratory: Negative for choking or stridor Cardiovascular: Negative for other chest pain and palpitations.  Gastrointestinal: Negative for worsening diarrhea, blood in stool, or abdominal distention Genitourinary: Negative for hematuria, flank pain or change in urine volume.  Musculoskeletal: Negative for myalgias or other joint complaints.  Skin: Negative for other color change and wound or drainage.  Neurological: Negative for syncope and numbness. other than noted Hematological: Negative for adenopathy. or other swelling Psychiatric/Behavioral: Negative for hallucinations, SI, self-injury, decreased concentration or other worsening agitation.  All other system neg per pt    Objective:   Physical Exam BP 134/74   Pulse 98   Temp 98.3 F (36.8 C) (Oral)   Resp 20  Wt 194 lb (88 kg)   SpO2 98%   BMI 34.37 kg/m  VS noted,  Constitutional: Pt is oriented to person, place, and time. Appears well-developed and well-nourished, in no significant distress Head: Normocephalic and atraumatic  Eyes: Conjunctivae and EOM are normal. Pupils are  equal, round, and reactive to light Right Ear: External ear normal.  Left Ear: External ear normal Nose: Nose normal.  Mouth/Throat: Oropharynx is clear and moist  Neck: Normal range of motion. Neck supple. No JVD present. No tracheal deviation present or significant neck LA or mass Cardiovascular: Normal rate, regular rhythm, normal heart sounds and intact distal pulses.   Pulmonary/Chest: Effort normal and breath sounds without rales or wheezing  Abdominal: Soft. Bowel sounds are normal. NT. No HSM  Musculoskeletal: Normal range of motion. Exhibits no edema Lymphadenopathy: Has no cervical adenopathy.  Neurological: Pt is alert and oriented to person, place, and time. Pt has normal reflexes. No cranial nerve deficit. Motor grossly intact Skin: Skin is warm and dry. No rash noted or new ulcers, but has marked right > left facial acneform changes without abscess or drainage Psychiatric:  Has normal mood and affect. Behavior is normal.  No other new exam findings    Assessment & Plan:

## 2016-06-11 LAB — HEPATITIS C ANTIBODY: HCV Ab: NEGATIVE

## 2016-06-15 ENCOUNTER — Encounter: Payer: Self-pay | Admitting: Internal Medicine

## 2016-07-22 ENCOUNTER — Encounter: Payer: Self-pay | Admitting: Internal Medicine

## 2017-03-14 ENCOUNTER — Ambulatory Visit (INDEPENDENT_AMBULATORY_CARE_PROVIDER_SITE_OTHER): Payer: 59 | Admitting: Internal Medicine

## 2017-03-14 ENCOUNTER — Encounter: Payer: Self-pay | Admitting: Internal Medicine

## 2017-03-14 VITALS — BP 124/86 | HR 89 | Temp 98.1°F | Ht 63.0 in | Wt 196.0 lb

## 2017-03-14 DIAGNOSIS — M5432 Sciatica, left side: Secondary | ICD-10-CM | POA: Diagnosis not present

## 2017-03-14 MED ORDER — GABAPENTIN 100 MG PO CAPS: 100.0000 mg | ORAL_CAPSULE | Freq: Three times a day (TID) | ORAL | 3 refills | Status: DC

## 2017-03-14 MED ORDER — PREDNISONE 10 MG PO TABS: ORAL_TABLET | ORAL | 0 refills | Status: DC

## 2017-03-14 NOTE — Patient Instructions (Signed)
Please take all new medication as prescribed - the prednisone, and the gabapentin if needed  OK to continue the alleve as you have  Please see our office Sports Medicine provider if you are not improving at least at 1-2 weeks  Please continue all other medications as before, and refills have been done if requested.  Please have the pharmacy call with any other refills you may need.  Please keep your appointments with your specialists as you may have planned

## 2017-03-14 NOTE — Assessment & Plan Note (Signed)
New onset, moderate pain without neuro change, for alleve prn, prednisone and gabapentin 100 tid prn, f/u with sports medicine if not improved, consider MRI if persists > 1 mo

## 2017-03-14 NOTE — Progress Notes (Signed)
   Subjective:    Patient ID: Michelle Solomon, female    DOB: [birth year], 56 y.o.   MRN: [id number]  HPI  Here with 2 wks onset gradually worsening moderate dull pain from the left lower back to buttock and postlat leg to just above the knee; worse to bend and twist, better to sit to rest, but Pt denies bowel or bladder change, fever, wt loss,  worsening LE numbness or weakness, gait change or falls. Denies urinary symptoms such as dysuria, frequency, urgency, flank pain, hematuria or n/v, fever, chills.  Denies worsening reflux, abd pain, dysphagia, n/v, bowel change or blood.  No prior hx of significant back disorder Past Medical History:  Diagnosis Date  . Acute bronchitis 06/06/2010  . ALLERGIC RHINITIS 05/29/2007  . Anal or rectal pain 06/23/2008  . ANEMIA-IRON DEFICIENCY 05/29/2007  . ASTHMA 05/29/2007  . 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 CONJUNCTIVITIS, ALLERGIC 11/27/2008  . HEMORRHOIDS 06/16/2008  . NIPPLE DISCHARGE 01/21/2010  . Other specified forms of hearing loss 10/08/2009  . Overweight(278.02) 05/29/2007  . RASH-NONVESICULAR 05/29/2007  . Wheezing 06/22/2010   No past surgical history on file.  reports that she has been smoking.  She has never used smokeless tobacco. She reports that she drinks alcohol. She reports that she does not use drugs. family history includes Allergies in her sister; Cancer in her other; Hypertension in her other. No Known Allergies Current Outpatient Prescriptions on File Prior to Visit  Medication Sig Dispense Refill  . albuterol (PROAIR HFA) 108 (90 BASE) MCG/ACT inhaler Inhale 2 puffs into the lungs 4 (four) times daily.      [name] Kitchen doxycycline (VIBRA-TABS) 100 MG tablet Take 1 tablet (100 mg total) by mouth 2 (two) times daily. 20 tablet 1  . fexofenadine (ALLEGRA) 180 MG tablet Take 180 mg by mouth daily.      [name] Kitchen ketotifen (ZADITOR) 0.025 % ophthalmic solution Use as directed     . meloxicam (MOBIC) 15 MG tablet Take 1 tablet (15 mg  total) by mouth daily. 30 tablet 0  . escitalopram (LEXAPRO) 10 MG tablet Take 1 tablet (10 mg total) by mouth daily. 90 tablet 3   No current facility-administered medications on file prior to visit.    Review of Systems  All other system neg per pt    Objective:   Physical Exam BP 124/86   Pulse 89   Temp 98.1 F (36.7 C) (Oral)   Ht 5\' 3"  (1.6 m)   Wt 196 lb (88.9 kg)   SpO2 100%   BMI 34.72 kg/m  VS noted, not ill appearing Constitutional: Pt appears in NAD HENT: Head: NCAT.  Right Ear: External ear normal.  Left Ear: External ear normal.  Eyes: . Pupils are equal, round, and reactive to light. Conjunctivae and EOM are normal Nose: without d/c or deformity Neck: Neck supple. Gross normal ROM Cardiovascular: Normal rate and regular rhythm.   Pulmonary/Chest: Effort normal and breath sounds without rales or wheezing.  Abd:  Soft, NT, ND, + BS, no organomegaly Spine: nontender in midline or paravertebral Neurological: Pt is alert. At baseline orientation, motor 5/5 intact, sens intact to LT Skin: Skin is warm. No rashes, other new lesions, no LE edema Psychiatric: Pt behavior is normal without agitation         Assessment & Plan:

## 2017-04-05 DIAGNOSIS — Z1231 Encounter for screening mammogram for malignant neoplasm of breast: Secondary | ICD-10-CM | POA: Diagnosis not present

## 2017-04-10 DIAGNOSIS — R922 Inconclusive mammogram: Secondary | ICD-10-CM | POA: Diagnosis not present

## 2017-04-10 LAB — HM MAMMOGRAPHY

## 2017-04-11 ENCOUNTER — Telehealth: Payer: Self-pay

## 2017-04-11 NOTE — Telephone Encounter (Signed)
error 

## 2017-09-06 ENCOUNTER — Encounter: Payer: Self-pay | Admitting: Urgent Care

## 2017-09-06 ENCOUNTER — Ambulatory Visit (INDEPENDENT_AMBULATORY_CARE_PROVIDER_SITE_OTHER): Payer: 59 | Admitting: Urgent Care

## 2017-09-06 ENCOUNTER — Ambulatory Visit (INDEPENDENT_AMBULATORY_CARE_PROVIDER_SITE_OTHER): Payer: 59

## 2017-09-06 VITALS — BP 164/92 | HR 83 | Temp 98.6°F | Resp 18 | Ht 63.0 in | Wt 200.0 lb

## 2017-09-06 DIAGNOSIS — M542 Cervicalgia: Secondary | ICD-10-CM | POA: Diagnosis not present

## 2017-09-06 DIAGNOSIS — R457 State of emotional shock and stress, unspecified: Secondary | ICD-10-CM

## 2017-09-06 DIAGNOSIS — F4321 Adjustment disorder with depressed mood: Secondary | ICD-10-CM

## 2017-09-06 DIAGNOSIS — M62838 Other muscle spasm: Secondary | ICD-10-CM | POA: Diagnosis not present

## 2017-09-06 DIAGNOSIS — M25511 Pain in right shoulder: Secondary | ICD-10-CM

## 2017-09-06 MED ORDER — MELOXICAM 15 MG PO TABS: 7.5000 mg | ORAL_TABLET | Freq: Every day | ORAL | 0 refills | Status: DC

## 2017-09-06 MED ORDER — CYCLOBENZAPRINE HCL 5 MG PO TABS: 5.0000 mg | ORAL_TABLET | Freq: Three times a day (TID) | ORAL | 1 refills | Status: DC | PRN

## 2017-09-06 MED ORDER — ALPRAZOLAM 0.5 MG PO TABS: 0.5000 mg | ORAL_TABLET | Freq: Two times a day (BID) | ORAL | 0 refills | Status: DC | PRN

## 2017-09-06 NOTE — Patient Instructions (Addendum)
[facility] [address] [place], [place] [address]  Burnard Leigh [phone]  Horton Finer [phone]  Everardo Beals [phone]   [facility] ([address] [place]) - [phone]  [facility]) - [facility] - [phone]  [facility] [place] - [phone]  [facility]  - [phone] (do not file insurance)      Complicated Grieving Grief is a normal response to the death of someone close to you. Feelings of fear, anger, and guilt can affect almost everyone who loses a loved one. It is also common to have symptoms of depression while you are grieving. These include problems with sleep, loss of appetite, and lack of energy. They may last for weeks or months after a loss. Complicated grief is different from normal grief or depression. Normal grieving involves sadness and feelings of loss, but these feelings are not constant. Complicated grief is a constant and severe type of grief. It interferes with your ability to function normally. It may last for several months to a year or longer. Complicated grief may require treatment from a mental health care provider. What are the causes? It is not known why some people continue to struggle with grief and others do not. You may be at higher risk for complicated grief if:  The death of your loved one was sudden or unexpected.  The death of your loved one was due to a violent event.  Your loved one committed suicide.  Your loved one was a child or a young person.  You were very close to or dependent on the loved one.  You have a history of depression.  What are the signs or symptoms? Signs and symptoms of complicated grief may include:  Feeling disbelief or numbness.  Being unable to enjoy good memories of your  loved one.  Needing to avoid anything that reminds you of your loved one.  Being unable to stop thinking about the death.  Feeling intense anger or guilt.  Feeling alone and hopeless.  Feeling that your life is meaningless and empty.  Losing the desire to live.  How is this diagnosed? Your health care provider may diagnose complicated grief if:  You have constant symptoms of grief for 6-12 months or longer.  Your symptoms are interfering with your ability to live your life.  Your health care provider may want you to see a mental health care provider. Many symptoms of depression are similar to the symptoms of complicated grief. It is important to be evaluated for complicated grief along with other mental health conditions. How is this treated? Talk therapy with a mental health provider is the most common treatment for complicated grief. During therapy, you will learn healthy ways to cope with the loss of your loved one. In some cases, your mental health care provider may also recommend antidepressant medicines. Follow these instructions at home:  Take care of yourself. ? Eat regular meals and maintain a healthy diet. Eat plenty of fruits, vegetables, and whole grains. ? Try to get some exercise each day. ? Keep regular hours for sleep. Try to get at least 8 hours of sleep each night.  Do not use drugs or alcohol to ease your symptoms.  Take medicines only as directed by your health care provider.  Spend time with friends and loved ones.  Consider joining a grief (bereavement) support group to help you deal  with your loss.  Keep all follow-up visits as directed by your health care provider. This is important. Contact a health care provider if:  Your symptoms keep you from functioning normally.  Your symptoms do not get better with treatment. Get help right away if:  You have serious thoughts of hurting yourself or someone else.  You have suicidal feelings. This  information is not intended to replace advice given to you by your health care provider. Make sure you discuss any questions you have with your health care provider. Document Released: 06/20/2005 Document Revised: 11/26/2015 Document Reviewed: 11/28/2013 Elsevier Interactive Patient Education  2018 [employer].     Alprazolam tablets What is this medicine? ALPRAZOLAM (al PRAY zoe lam) is a benzodiazepine. It is used to treat anxiety and panic attacks. This medicine may be used for other purposes; ask your health care provider or pharmacist if you have questions. COMMON BRAND NAME(S): Xanax What should I tell my health care provider before I take this medicine? They need to know if you have any of these conditions: -an alcohol or drug abuse problem -bipolar disorder, depression, psychosis or other mental health conditions -glaucoma -kidney or liver disease -lung or breathing disease -myasthenia gravis -Parkinson's disease -porphyria -seizures or a history of seizures -suicidal thoughts -an unusual or allergic reaction to alprazolam, other benzodiazepines, foods, dyes, or preservatives -pregnant or trying to get pregnant -breast-feeding How should I use this medicine? Take this medicine by mouth with a glass of water. Follow the directions on the prescription label. Take your medicine at regular intervals. Do not take it more often than directed. Do not stop taking except on your doctor's advice. A special MedGuide will be given to you by the pharmacist with each prescription and refill. Be sure to read this information carefully each time. Talk to your pediatrician regarding the use of this medicine in children. Special care may be needed. Overdosage: If you think you have taken too much of this medicine contact a poison control center or emergency room at once. NOTE: This medicine is only for you. Do not share this medicine with others. What if I miss a dose? If you miss a dose,  take it as soon as you can. If it is almost time for your next dose, take only that dose. Do not take double or extra doses. What may interact with this medicine? Do not take this medicine with any of the following medications: -certain antiviral medicines for HIV or AIDS like delavirdine, indinavir -certain medicines for fungal infections like ketoconazole and itraconazole -narcotic medicines for cough -sodium oxybate This medicine may also interact with the following medications: -alcohol -antihistamines for allergy, cough and cold -certain antibiotics like clarithromycin, erythromycin, isoniazid, rifampin, rifapentine, rifabutin, and troleandomycin -certain medicines for blood pressure, heart disease, irregular heart beat -certain medicines for depression, like amitriptyline, fluoxetine, sertraline -certain medicines for seizures like carbamazepine, oxcarbazepine, phenobarbital, phenytoin, primidone -cimetidine -cyclosporine -female hormones, like estrogens or progestins and birth control pills, patches, rings, or injections -general anesthetics like halothane, isoflurane, methoxyflurane, propofol -grapefruit juice -local anesthetics like lidocaine, pramoxine, tetracaine -medicines that relax muscles for surgery -narcotic medicines for pain -other antiviral medicines for HIV or AIDS -phenothiazines like chlorpromazine, mesoridazine, prochlorperazine, thioridazine This list may not describe all possible interactions. Give your health care provider a list of all the medicines, herbs, non-prescription drugs, or dietary supplements you use. Also tell them if you smoke, drink alcohol, or use illegal drugs. Some items may interact with your medicine.  What should I watch for while using this medicine? Tell your doctor or health care professional if your symptoms do not start to get better or if they get worse. Do not stop taking except on your doctor's advice. You may develop a severe  reaction. Your doctor will tell you how much medicine to take. You may get drowsy or dizzy. Do not drive, use machinery, or do anything that needs mental alertness until you know how this medicine affects you. To reduce the risk of dizzy and fainting spells, do not stand or sit up quickly, especially if you are an older patient. Alcohol may increase dizziness and drowsiness. Avoid alcoholic drinks. If you are taking another medicine that also causes drowsiness, you may have more side effects. Give your health care provider a list of all medicines you use. Your doctor will tell you how much medicine to take. Do not take more medicine than directed. Call emergency for help if you have problems breathing or unusual sleepiness. What side effects may I notice from receiving this medicine? Side effects that you should report to your doctor or health care professional as soon as possible: -allergic reactions like skin rash, itching or hives, swelling of the face, lips, or tongue -breathing problems -confusion -loss of balance or coordination -signs and symptoms of low blood pressure like dizziness; feeling faint or lightheaded, falls; unusually weak or tired -suicidal thoughts or other mood changes Side effects that usually do not require medical attention (report to your doctor or health care professional if they continue or are bothersome): -dizziness -dry mouth -nausea, vomiting -tiredness This list may not describe all possible side effects. Call your doctor for medical advice about side effects. You may report side effects to FDA at 1-800-FDA-1088. Where should I keep my medicine? Keep out of the reach of children. This medicine can be abused. Keep your medicine in a safe place to protect it from theft. Do not share this medicine with anyone. Selling or giving away this medicine is dangerous and against the law. Store at room temperature between 20 and 25 degrees C (68 and 77 degrees F). This  medicine may cause accidental overdose and death if taken by other adults, children, or pets. Mix any unused medicine with a substance like cat litter or coffee grounds. Then throw the medicine away in a sealed container like a sealed bag or a coffee can with a lid. Do not use the medicine after the expiration date. NOTE: This sheet is a summary. It may not cover all possible information. If you have questions about this medicine, talk to your doctor, pharmacist, or health care provider.  2018 Elsevier/Gold Standard (2015-03-19 13:47:25)      Muscle Pain, Adult Muscle pain (myalgia) may be mild or severe. In most cases, the pain lasts only a short time and it goes away without treatment. It is normal to feel some muscle pain after starting a workout program. Muscles that have not been used often will be sore at first. Muscle pain may also be caused by many other things, including:  Overuse or muscle strain, especially if you are not in shape. This is the most common cause of muscle pain.  Injury.  Bruises.  Viruses, such as the flu.  Infectious diseases.  A chronic condition that causes muscle tenderness, fatigue, and headache (fibromyalgia).  A condition, such as lupus, in which the body's disease-fighting system attacks other organs in the body (autoimmune or rheumatologic diseases).  Certain drugs, including ACE  inhibitors and statins.  To diagnose the cause of your muscle pain, your health care provider will do a physical exam and ask questions about the pain and when it began. If you have not had muscle pain for very long, your health care provider may want to wait before doing much testing. If your muscle pain has lasted a long time, your health care provider may want to run tests right away. In some cases, this may include tests to rule out certain conditions or illnesses. Treatment for muscle pain depends on the cause. Home care is often enough to relieve muscle pain. Your  health care provider may also prescribe anti-inflammatory medicine. Follow these instructions at home: Activity  If overuse is causing your muscle pain: ? Slow down your activities until the pain goes away. ? Do regular, gentle exercises if you are not usually active. ? Warm up before exercising. Stretch before and after exercising. This can help lower the risk of muscle pain.  Do not continue working out if the pain is very bad. Bad pain could mean that you have injured a muscle. Managing pain and discomfort   If directed, apply ice to the sore muscle: ? Put ice in a plastic bag. ? Place a towel between your skin and the bag. ? Leave the ice on for 20 minutes, 2-3 times a day.  You may also alternate between applying ice and applying heat as told by your health care provider. To apply heat, use the heat source that your health care provider recommends, such as a moist heat pack or a heating pad. ? Place a towel between your skin and the heat source. ? Leave the heat on for 20-30 minutes. ? Remove the heat if your skin turns bright red. This is especially important if you are unable to feel pain, heat, or cold. You may have a greater risk of getting burned. Medicines  Take over-the-counter and prescription medicines only as told by your health care provider.  Do not drive or use heavy machinery while taking prescription pain medicine. Contact a health care provider if:  Your muscle pain gets worse and medicines do not help.  You have muscle pain that lasts longer than 3 days.  You have a rash or fever along with muscle pain.  You have muscle pain after a tick bite.  You have muscle pain while working out, even though you are in good physical condition.  You have redness, soreness, or swelling along with muscle pain.  You have muscle pain after starting a new medicine or changing the dose of a medicine. Get help right away if:  You have trouble breathing.  You have  trouble swallowing.  You have muscle pain along with a stiff neck, fever, and vomiting.  You have severe muscle weakness or cannot move part of your body. This information is not intended to replace advice given to you by your health care provider. Make sure you discuss any questions you have with your health care provider. Document Released: 05/12/2006 Document Revised: 01/08/2016 Document Reviewed: 11/10/2015 Elsevier Interactive Patient Education  2018 [employer].      IF you received an x-ray today, you will receive an invoice from [facility]. Please contact [facility] at [phone] with questions or concerns regarding your invoice.   IF you received labwork today, you will receive an invoice from [facility]. Please contact [facility] at [phone] with questions or concerns regarding your invoice.   Our billing staff will not be  able to assist you with questions regarding bills from these companies.  You will be contacted with the lab results as soon as they are available. The fastest way to get your results is to activate your My Chart account. Instructions are located on the last page of this paperwork. If you have not heard from [place] regarding the results in 2 weeks, please contact this office.

## 2017-09-06 NOTE — Progress Notes (Signed)
MRN: [id number] DOB: [birth year]  Subjective:   Michelle Solomon is a 57 y.o. female presenting for 1 day history of neck pain, right shoulder pain. Pain is constant, sharp, achy, does not radiate, has associated stiffness of her neck. Has not tried medications for relief. Denies fever, redness, swelling, trauma, falls, weakness, numbness or tingling. Of note, patient lost her husband this past week due to massive heart attack. She is not sleeping well, feels very overwhelmed, is having crying spells. She is having to drive to [place] for the funeral. Fortunately, she will not be driving alone, she will be with family. Denies SI, HI. She smokes ~1 pack per week. Drinks alcohol on occasion.   Michelle Solomon has a current medication list which includes the following prescription(s): albuterol, doxycycline, fexofenadine, gabapentin, ketotifen, meloxicam, prednisone, and escitalopram. Also has No Known Allergies.  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 Denies past surgical history.   Objective:   Vitals: BP (!) 164/92   Pulse 83   Temp 98.6 F (37 C) (Oral)   Resp 18   Ht 5\' 3"  (1.6 m)   Wt 200 lb (90.7 kg)   SpO2 100%   BMI 35.43 kg/m   BP Readings from Last 3 Encounters:  09/06/17 (!) 164/92  03/14/17 124/86  06/10/16 134/74   Physical Exam  Constitutional: She is oriented to person, place, and time. She appears well-developed and well-nourished.  Cardiovascular: Normal rate.  Pulmonary/Chest: Effort normal.  Musculoskeletal:       Right shoulder: She exhibits  decreased range of motion and tenderness (over AC joint). She exhibits no swelling, no effusion, no crepitus, no deformity, no laceration, no spasm and normal strength.       Cervical back: She exhibits decreased range of motion and tenderness (over area depicted). She exhibits no bony tenderness, no swelling, no edema, no deformity and no spasm.       Back:  Neurological: She is alert and oriented to person, place, and time.  Psychiatric: She expresses no homicidal and no suicidal ideation.  Tearful, distressed when speaking about her husband.   Dg Cervical Spine Complete  Result Date: 09/06/2017 CLINICAL DATA:  57 y/o  F; neck pain. EXAM: CERVICAL SPINE - COMPLETE 4+ VIEW COMPARISON:  None. FINDINGS: There is no evidence of cervical spine fracture or prevertebral soft tissue swelling. Alignment is normal. No other significant bone abnormalities are identified. IMPRESSION: Negative cervical spine radiographs. Electronically Signed   By: Kristine Garbe M.D.   On: 09/06/2017 17:52   Dg Shoulder Right  Result Date: 09/06/2017 CLINICAL DATA:  Right shoulder pain, no known injury, initial encounter EXAM: RIGHT SHOULDER - 2+ VIEW COMPARISON:  05/12/2015 FINDINGS: Degenerative changes of the acromioclavicular joint are again seen. No acute fracture or dislocation is noted. The underlying bony thorax is within normal limits. IMPRESSION: Stable degenerative change without acute abnormality. Electronically Signed   By: Inez Catalina M.D.   On: 09/06/2017 17:51    Assessment and Plan :   Neck pain - Plan: DG Cervical Spine Complete  Acute pain of right shoulder - Plan: DG Shoulder Right  Muscle spasm  Under emotional stress  Grieving  Start meloxicam, Flexeril for neck pain, shoulder pain. X-rays are reassuring.  Counseled patient on grieving and emotional crisis from loss of her husband. She will use Xanax as needed for now up to twice daily. Provided patient with list of therapists she can  work with. Patient contracts for safety. Return-to-clinic precautions discussed, patient verbalized understanding.   Jaynee Eagles, PA-C [facility] [phone] 09/06/2017  5:12 PM

## 2017-09-19 ENCOUNTER — Telehealth: Payer: Self-pay

## 2017-09-19 ENCOUNTER — Ambulatory Visit: Payer: 59 | Admitting: Internal Medicine

## 2017-09-19 NOTE — Telephone Encounter (Signed)
Copied from [place] [phone]. Topic: General - Other >> Sep 19, 2017 11:09 AM Boyd Kerbs wrote: Reason for CRM:  [employer] disability- [phone]  faxed paperwork on 3/15 and wanting to make sure office received the paper work.  Needs to be completed no later than April 1st.    If not received please call  >> Sep 19, 2017 11:34 AM Para Skeans A wrote: I have not received any paperwork, Did you?     Yes, paperwork was placed on Dr. Judi Cong desk yesterday for review once he returns to office.

## 2017-09-20 ENCOUNTER — Encounter: Payer: Self-pay | Admitting: Internal Medicine

## 2017-09-20 ENCOUNTER — Ambulatory Visit (INDEPENDENT_AMBULATORY_CARE_PROVIDER_SITE_OTHER): Payer: 59 | Admitting: Internal Medicine

## 2017-09-20 VITALS — BP 122/78 | HR 110 | Temp 98.1°F | Ht 63.0 in | Wt 198.0 lb

## 2017-09-20 DIAGNOSIS — F32A Depression, unspecified: Secondary | ICD-10-CM

## 2017-09-20 DIAGNOSIS — F4321 Adjustment disorder with depressed mood: Secondary | ICD-10-CM

## 2017-09-20 DIAGNOSIS — F419 Anxiety disorder, unspecified: Secondary | ICD-10-CM

## 2017-09-20 DIAGNOSIS — F329 Major depressive disorder, single episode, unspecified: Secondary | ICD-10-CM | POA: Diagnosis not present

## 2017-09-20 DIAGNOSIS — F4320 Adjustment disorder, unspecified: Secondary | ICD-10-CM | POA: Insufficient documentation

## 2017-09-20 MED ORDER — ESCITALOPRAM OXALATE 10 MG PO TABS: 10.0000 mg | ORAL_TABLET | Freq: Every day | ORAL | 3 refills | Status: DC

## 2017-09-20 MED ORDER — ALPRAZOLAM 0.5 MG PO TABS: 0.5000 mg | ORAL_TABLET | Freq: Every evening | ORAL | 2 refills | Status: DC | PRN

## 2017-09-20 NOTE — Patient Instructions (Signed)
Please take all new medication as prescribed - the lexapro  Please continue all other medications as before, and refills have been done if requested - the xanax  Please have the pharmacy call with any other refills you may need.  Please keep your appointments with your specialists as you may have planned  You will be contacted regarding the referral for: counseling  Your form should be filled out soon

## 2017-09-20 NOTE — Progress Notes (Signed)
Subjective:    Patient ID: Michelle Solomon, female    DOB: [birth year], 57 y.o.   MRN: [id number]  HPI  Here due to : Husband died 02/25/2024sudden death at 43, found by wife on coming home, did CPR, called EMS but he didn't recover.  She had to do 2 funerals here and [place].  Not back to work since then. She not sure but denies worsening depressive symptoms, suicidal ideation, or panic; has ongoing anxiety.  Did see PA Jaynee Eagles at [facility] on mar 6 with evaluation for right shoulder including neg c spine films and stable mild OA changes to right shoulder. Was given some xanax prn for grief., anxiety and sleeping worse, as well as muscle relaxer for pain.  Right shoulder now improved, now left is achy in the last wk.  Was rx lexapro 2016 and did help, ok to try again, requests xanax for sleep at night. Also open for referral for counseling.  Has form to fill out for Short term leave  Too overwhelmed to concentrate to return.. Has not worked since 02/25/24getting by on vacation and sick days.  Asks for start leave on Mar 4 due to time already paid for funeral time per company.  Asking to return Nov 06 2017.   Wt down 2 lbs since  Last visit.  No other interval hx or change Wt Readings from Last 3 Encounters:  09/20/17 198 lb (89.8 kg)  09/06/17 200 lb (90.7 kg)  03/14/17 196 lb (88.9 kg)   Past Medical History:  Diagnosis Date  . Acute bronchitis 06/06/2010  . ALLERGIC RHINITIS 05/29/2007  . Anal or rectal pain 06/23/2008  . ANEMIA-IRON DEFICIENCY 05/29/2007  . ASTHMA 05/29/2007  . 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 CONJUNCTIVITIS, ALLERGIC 11/27/2008  . HEMORRHOIDS 06/16/2008  . NIPPLE DISCHARGE 01/21/2010  . Other specified forms of hearing loss 10/08/2009  . Overweight(278.02) 05/29/2007  . RASH-NONVESICULAR 05/29/2007  . Wheezing 06/22/2010   No past surgical history on file.  reports that she has been smoking.  She has never used smokeless tobacco. She reports that she  drinks alcohol. She reports that she does not use drugs. family history includes Allergies in her sister; Cancer in her other; Hypertension in her other. No Known Allergies Current Outpatient Medications on File Prior to Visit  Medication Sig Dispense Refill  . albuterol (PROAIR HFA) 108 (90 BASE) MCG/ACT inhaler Inhale 2 puffs into the lungs 4 (four) times daily.      . cyclobenzaprine (FLEXERIL) 5 MG tablet Take 1 tablet (5 mg total) by mouth 3 (three) times daily as needed. 90 tablet 1  . gabapentin (NEURONTIN) 100 MG capsule Take 1 capsule (100 mg total) by mouth 3 (three) times daily. 90 capsule 3  . ketotifen (ZADITOR) 0.025 % ophthalmic solution Use as directed     . meloxicam (MOBIC) 15 MG tablet Take 0.5-1 tablets (7.5-15 mg total) by mouth daily. 30 tablet 0   No current facility-administered medications on file prior to visit.    Review of Systems  Constitutional: Negative for other unusual diaphoresis or sweats HENT: Negative for ear discharge or swelling Eyes: Negative for other worsening visual disturbances Respiratory: Negative for stridor or other swelling  Gastrointestinal: Negative for worsening distension or other blood Genitourinary: Negative for retention or other urinary change Musculoskeletal: Negative for other MSK pain or swelling Skin: Negative for color change or other new lesions Neurological: Negative for worsening tremors and  other numbness  Psychiatric/Behavioral: Negative for worsening agitation or other fatigue All other system neg per pt    Objective:   Physical Exam BP 122/78   Pulse (!) 110   Temp 98.1 F (36.7 C) (Oral)   Ht 5\' 3"  (1.6 m)   Wt 198 lb (89.8 kg)   SpO2 98%   BMI 35.07 kg/m  VS noted,  Constitutional: Pt appears in NAD HENT: Head: NCAT.  Right Ear: External ear normal.  Left Ear: External ear normal.  Eyes: . Pupils are equal, round, and reactive to light. Conjunctivae and EOM are normal Nose: without d/c or  deformity Neck: Neck supple. Gross normal ROM Cardiovascular: Normal rate and regular rhythm.   Pulmonary/Chest: Effort normal and breath sounds without rales or wheezing.  Neurological: Pt is alert. At baseline orientation, motor grossly intact Skin: Skin is warm. No rashes, other new lesions, no LE edema Psychiatric: Pt behavior is normal without agitation but anxious depressed affect with great sadness, tearful at times No other exam findings    Assessment & Plan:

## 2017-09-21 ENCOUNTER — Telehealth: Payer: Self-pay

## 2017-09-21 NOTE — Telephone Encounter (Signed)
Called Francie, LVM informing her that forms was received and of the office policy on turn around time.   Copied from [place] [id number]. Topic: General - Other >> Sep 21, 2017  9:32 AM Aurelio Brash B wrote: Reason for CRM:  Sudie Grumbling from [place] would like a call back concerning pts Short Term Disability forms and medical records request    She wants to make sure the paperwork was received. Contact number is [phone]  >> Sep 21, 2017 12:01 PM Para Skeans A wrote: There is a TE about this already, please follow it.  >> Sep 21, 2017 12:01 PM Para Skeans A wrote: Kadee Philyaw just an [facility] for you.

## 2017-09-22 DIAGNOSIS — Z0279 Encounter for issue of other medical certificate: Secondary | ICD-10-CM

## 2017-09-23 NOTE — Assessment & Plan Note (Signed)
Also for counseling referral, form filled out

## 2017-09-23 NOTE — Assessment & Plan Note (Signed)
Mild to mod, no SI or HI, for lexapro 10 qd, xanax prn asd,  to f/u any worsening symptoms or concerns

## 2017-09-25 NOTE — Telephone Encounter (Signed)
Forms have been completed & faxed, copy sent to scan &charged for.

## 2017-09-26 ENCOUNTER — Telehealth: Payer: Self-pay | Admitting: Internal Medicine

## 2017-09-26 NOTE — Telephone Encounter (Signed)
Copied from [place] [id number]. Topic: Inquiry >> Sep 26, 2017  9:49 AM Conception Chancy, NT wrote: Patient is calling and states she needs FMLA for her job. Her job is requesting the office notes from 09/06/17 faxed to them. Asap. Fax# [phone] attention Sudie Grumbling   Case Number [id number]  Pt seen Jaynee Eagles 09/06/17

## 2017-09-26 NOTE — Telephone Encounter (Signed)
Patient will have to have her employer fax [place] a release with her signature on it before we can just release records.  There was no number for me to call and let the employer know. I will send a fax to the number provider and let them know.

## 2017-09-27 NOTE — Telephone Encounter (Signed)
Pt is aware to have her employer send [place] a release. Pt will have them fax to you asap.  Pt states she provided the incorrect fas number The correct fax # is: Fax:  [phone]  Pt states her deadline is April 1, so she needs to take care of this asap.

## 2017-10-05 ENCOUNTER — Ambulatory Visit (INDEPENDENT_AMBULATORY_CARE_PROVIDER_SITE_OTHER): Payer: 59 | Admitting: Psychology

## 2017-10-05 DIAGNOSIS — F4323 Adjustment disorder with mixed anxiety and depressed mood: Secondary | ICD-10-CM | POA: Diagnosis not present

## 2017-10-09 ENCOUNTER — Ambulatory Visit (INDEPENDENT_AMBULATORY_CARE_PROVIDER_SITE_OTHER): Payer: 59 | Admitting: Psychology

## 2017-10-09 DIAGNOSIS — F4323 Adjustment disorder with mixed anxiety and depressed mood: Secondary | ICD-10-CM | POA: Diagnosis not present

## 2017-10-10 DIAGNOSIS — R921 Mammographic calcification found on diagnostic imaging of breast: Secondary | ICD-10-CM | POA: Diagnosis not present

## 2017-10-16 ENCOUNTER — Ambulatory Visit (INDEPENDENT_AMBULATORY_CARE_PROVIDER_SITE_OTHER): Payer: 59 | Admitting: Psychology

## 2017-10-16 DIAGNOSIS — F4323 Adjustment disorder with mixed anxiety and depressed mood: Secondary | ICD-10-CM

## 2017-10-17 DIAGNOSIS — N6012 Diffuse cystic mastopathy of left breast: Secondary | ICD-10-CM | POA: Diagnosis not present

## 2017-10-17 DIAGNOSIS — R922 Inconclusive mammogram: Secondary | ICD-10-CM | POA: Diagnosis not present

## 2017-10-24 ENCOUNTER — Other Ambulatory Visit: Payer: Self-pay | Admitting: Urgent Care

## 2017-10-26 ENCOUNTER — Ambulatory Visit (INDEPENDENT_AMBULATORY_CARE_PROVIDER_SITE_OTHER): Payer: 59 | Admitting: Psychology

## 2017-10-26 DIAGNOSIS — F4323 Adjustment disorder with mixed anxiety and depressed mood: Secondary | ICD-10-CM

## 2017-10-30 ENCOUNTER — Ambulatory Visit (INDEPENDENT_AMBULATORY_CARE_PROVIDER_SITE_OTHER): Payer: 59 | Admitting: Psychology

## 2017-10-30 DIAGNOSIS — F4323 Adjustment disorder with mixed anxiety and depressed mood: Secondary | ICD-10-CM | POA: Diagnosis not present

## 2017-11-06 ENCOUNTER — Ambulatory Visit (INDEPENDENT_AMBULATORY_CARE_PROVIDER_SITE_OTHER): Payer: 59 | Admitting: Psychology

## 2017-11-06 DIAGNOSIS — F4323 Adjustment disorder with mixed anxiety and depressed mood: Secondary | ICD-10-CM | POA: Diagnosis not present

## 2017-11-09 ENCOUNTER — Encounter: Payer: Self-pay | Admitting: Internal Medicine

## 2017-11-09 ENCOUNTER — Ambulatory Visit (INDEPENDENT_AMBULATORY_CARE_PROVIDER_SITE_OTHER): Payer: 59 | Admitting: Internal Medicine

## 2017-11-09 ENCOUNTER — Other Ambulatory Visit (INDEPENDENT_AMBULATORY_CARE_PROVIDER_SITE_OTHER): Payer: 59

## 2017-11-09 VITALS — BP 120/90 | HR 96 | Temp 98.5°F | Ht 63.0 in | Wt 193.0 lb

## 2017-11-09 DIAGNOSIS — Z0000 Encounter for general adult medical examination without abnormal findings: Secondary | ICD-10-CM

## 2017-11-09 DIAGNOSIS — Z114 Encounter for screening for human immunodeficiency virus [HIV]: Secondary | ICD-10-CM | POA: Diagnosis not present

## 2017-11-09 DIAGNOSIS — R739 Hyperglycemia, unspecified: Secondary | ICD-10-CM

## 2017-11-09 LAB — HEPATIC FUNCTION PANEL
ALBUMIN: 3.9 g/dL (ref 3.5–5.2)
ALT: 11 U/L (ref 0–35)
AST: 14 U/L (ref 0–37)
Alkaline Phosphatase: 78 U/L (ref 39–117)
Bilirubin, Direct: 0.1 mg/dL (ref 0.0–0.3)
Total Bilirubin: 0.5 mg/dL (ref 0.2–1.2)
Total Protein: 7 g/dL (ref 6.0–8.3)

## 2017-11-09 LAB — LIPID PANEL
CHOL/HDL RATIO: 3
Cholesterol: 155 mg/dL (ref 0–200)
HDL: 59.2 mg/dL (ref >39.00)
LDL Cholesterol: 83 mg/dL (ref 0–99)
NONHDL: 95.38
TRIGLYCERIDES: 60 mg/dL (ref 0.0–149.0)
VLDL: 12 mg/dL (ref 0.0–40.0)

## 2017-11-09 LAB — BASIC METABOLIC PANEL
BUN: 7 mg/dL (ref 6–23)
CHLORIDE: 102 meq/L (ref 96–112)
CO2: 31 meq/L (ref 19–32)
Calcium: 9.3 mg/dL (ref 8.4–10.5)
Creatinine, Ser: 0.66 mg/dL (ref 0.40–1.20)
GFR: 119.41 mL/min (ref >60.00)
Glucose, Bld: 101 mg/dL — ABNORMAL HIGH (ref 70–99)
Potassium: 3.9 mEq/L (ref 3.5–5.1)
SODIUM: 138 meq/L (ref 135–145)

## 2017-11-09 LAB — CBC WITH DIFFERENTIAL/PLATELET
Basophils Absolute: 0.1 10*3/uL (ref 0.0–0.1)
Basophils Relative: 0.8 % (ref 0.0–3.0)
EOS PCT: 1.6 % (ref 0.0–5.0)
Eosinophils Absolute: 0.1 10*3/uL (ref 0.0–0.7)
HCT: 39.8 % (ref 36.0–46.0)
Hemoglobin: 12.9 g/dL (ref 12.0–15.0)
LYMPHS ABS: 2.8 10*3/uL (ref 0.7–4.0)
Lymphocytes Relative: 35.7 % (ref 12.0–46.0)
MCHC: 32.4 g/dL (ref 30.0–36.0)
MCV: 81.3 fl (ref 78.0–100.0)
MONOS PCT: 9.8 % (ref 3.0–12.0)
Monocytes Absolute: 0.8 10*3/uL (ref 0.1–1.0)
NEUTROS ABS: 4.1 10*3/uL (ref 1.4–7.7)
NEUTROS PCT: 52.1 % (ref 43.0–77.0)
PLATELETS: 245 10*3/uL (ref 150.0–400.0)
RBC: 4.89 Mil/uL (ref 3.87–5.11)
RDW: 14.8 % (ref 11.5–15.5)
WBC: 7.8 10*3/uL (ref 4.0–10.5)

## 2017-11-09 LAB — URINALYSIS, ROUTINE W REFLEX MICROSCOPIC
LEUKOCYTES UA: NEGATIVE
NITRITE: NEGATIVE
PH: 6 (ref 5.0–8.0)
Total Protein, Urine: NEGATIVE
URINE GLUCOSE: NEGATIVE
UROBILINOGEN UA: 1 (ref 0.0–1.0)

## 2017-11-09 LAB — TSH: TSH: 0.83 u[IU]/mL (ref 0.35–4.50)

## 2017-11-09 LAB — HEMOGLOBIN A1C: Hgb A1c MFr Bld: 5.6 % (ref 4.6–6.5)

## 2017-11-09 MED ORDER — HYDROCORTISONE 2.5 % EX CREA: TOPICAL_CREAM | Freq: Two times a day (BID) | CUTANEOUS | 1 refills | Status: DC

## 2017-11-09 MED ORDER — ESCITALOPRAM OXALATE 10 MG PO TABS: 10.0000 mg | ORAL_TABLET | Freq: Every day | ORAL | 3 refills | Status: DC

## 2017-11-09 NOTE — Progress Notes (Signed)
Subjective:    Patient ID: Michelle Solomon, female    DOB: 04/21/1961, 57 y.o.   MRN: [id number]  HPI  Here for wellness and f/u;  Overall doing ok;  Pt denies Chest pain, worsening SOB, DOE, wheezing, orthopnea, PND, worsening LE edema, palpitations, dizziness or syncope.  Pt denies neurological change such as new headache, facial or extremity weakness.  Pt denies polydipsia, polyuria, or low sugar symptoms. Pt states overall good compliance with treatment and medications, good tolerability, and has been trying to follow appropriate diet.  Pt denies worsening depressive symptoms, suicidal ideation or panic. No fever, night sweats, wt loss, loss of appetite, or other constitutional symptoms.  Pt states good ability with ADL's, has low fall risk, home safety reviewed and adequate, no other significant changes in hearing or vision, and only occasionally active with exercise.  Plans to sched appt with GYN soon Taken out of work mar 4, has been seeing therapist, overall improved, next appt may 13, needs note to return as of may 20.  No other interval hx or new complaints Past Medical History:  Diagnosis Date  . Acute bronchitis 06/06/2010  . ALLERGIC RHINITIS 05/29/2007  . Anal or rectal pain 06/23/2008  . ANEMIA-IRON DEFICIENCY 05/29/2007  . ASTHMA 05/29/2007  . 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 CONJUNCTIVITIS, ALLERGIC 11/27/2008  . HEMORRHOIDS 06/16/2008  . NIPPLE DISCHARGE 01/21/2010  . Other specified forms of hearing loss 10/08/2009  . Overweight(278.02) 05/29/2007  . RASH-NONVESICULAR 05/29/2007  . Wheezing 06/22/2010   History reviewed. No pertinent surgical history.  reports that she has been smoking.  She has never used smokeless tobacco. She reports that she drinks alcohol. She reports that she does not use drugs. family history includes Allergies in her sister; Cancer in her other; Hypertension in her other. No Known Allergies Current Outpatient Medications on File  Prior to Visit  Medication Sig Dispense Refill  . albuterol (PROAIR HFA) 108 (90 BASE) MCG/ACT inhaler Inhale 2 puffs into the lungs 4 (four) times daily.      [name] Kitchen ALPRAZolam (XANAX) 0.5 MG tablet Take 1 tablet (0.5 mg total) by mouth at bedtime as needed for anxiety or sleep. 30 tablet 2  . cyclobenzaprine (FLEXERIL) 5 MG tablet Take 1 tablet (5 mg total) by mouth 3 (three) times daily as needed. 90 tablet 1  . gabapentin (NEURONTIN) 100 MG capsule Take 1 capsule (100 mg total) by mouth 3 (three) times daily. 90 capsule 3  . ketotifen (ZADITOR) 0.025 % ophthalmic solution Use as directed     . meloxicam (MOBIC) 15 MG tablet Take 0.5-1 tablets (7.5-15 mg total) by mouth daily. 30 tablet 0   No current facility-administered medications on file prior to visit.    Review of Systems Constitutional: Negative for other unusual diaphoresis, sweats, appetite or weight changes HENT: Negative for other worsening hearing loss, ear pain, facial swelling, mouth sores or neck stiffness.   Eyes: Negative for other worsening pain, redness or other visual disturbance.  Respiratory: Negative for other stridor or swelling Cardiovascular: Negative for other palpitations or other chest pain  Gastrointestinal: Negative for worsening diarrhea or loose stools, blood in stool, distention or other pain Genitourinary: Negative for hematuria, flank pain or other change in urine volume.  Musculoskeletal: Negative for myalgias or other joint swelling.  Skin: Negative for other color change, or other wound or worsening drainage.  Neurological: Negative for other syncope or numbness. Hematological: Negative for other adenopathy or swelling Psychiatric/Behavioral: Negative for  hallucinations, other worsening agitation, SI, self-injury, or new decreased concentration All other system neg per pt    Objective:   Physical Exam BP 120/90 (BP Location: Left Arm, Patient Position: Sitting, Cuff Size: Large)   Pulse 96   Temp  98.5 F (36.9 C) (Oral)   Ht 5\' 3"  (1.6 m)   Wt 193 lb (87.5 kg)   SpO2 97%   BMI 34.19 kg/m  VS noted,  Constitutional: Pt is oriented to person, place, and time. Appears well-developed and well-nourished, in no significant distress and comfortable Head: Normocephalic and atraumatic  Eyes: Conjunctivae and EOM are normal. Pupils are equal, round, and reactive to light Right Ear: External ear normal without discharge Left Ear: External ear normal without discharge Nose: Nose without discharge or deformity Mouth/Throat: Oropharynx is without other ulcerations and moist  Neck: Normal range of motion. Neck supple. No JVD present. No tracheal deviation present or significant neck LA or mass Cardiovascular: Normal rate, regular rhythm, normal heart sounds and intact distal pulses.   Pulmonary/Chest: WOB normal and breath sounds without rales or wheezing  Abdominal: Soft. Bowel sounds are normal. NT. No HSM  Musculoskeletal: Normal range of motion. Exhibits no edema Lymphadenopathy: Has no other cervical adenopathy.  Neurological: Pt is alert and oriented to person, place, and time. Pt has normal reflexes. No cranial nerve deficit. Motor grossly intact, Gait intact Skin: Skin is warm and dry. No rash noted or new ulcerations Psychiatric:  Has normal mood and affect. Behavior is normal without agitation No other exam findings Lab Results  Component Value Date   WBC 7.8 11/09/2017   HGB 12.9 11/09/2017   HCT 39.8 11/09/2017   PLT 245.0 11/09/2017   GLUCOSE 101 (H) 11/09/2017   CHOL 155 11/09/2017   TRIG 60.0 11/09/2017   HDL 59.20 11/09/2017   LDLCALC 83 11/09/2017   ALT 11 11/09/2017   AST 14 11/09/2017   NA 138 11/09/2017   K 3.9 11/09/2017   CL 102 11/09/2017   CREATININE 0.66 11/09/2017   BUN 7 11/09/2017   CO2 31 11/09/2017   TSH 0.83 11/09/2017   HGBA1C 5.6 11/09/2017      Assessment & Plan:

## 2017-11-09 NOTE — Assessment & Plan Note (Signed)
Lab Results  Component Value Date   HGBA1C 5.6 11/09/2017  stable overall by history and exam, recent data reviewed with pt, and pt to continue medical treatment as before,  to f/u any worsening symptoms or concerns

## 2017-11-09 NOTE — Patient Instructions (Addendum)
You will be contacted regarding the referral for: colonoscopy  Your form was filled out today  Please continue all other medications as before, and refills have been done if requested - the lexapro  Please have the pharmacy call with any other refills you may need.  Please continue your efforts at being more active, low cholesterol diet, and weight control.  You are otherwise up to date with prevention measures today.  Please keep your appointments with your specialists as you may have planned  Please go to the LAB in the Basement (turn left off the elevator) for the tests to be done today  You will be contacted by phone if any changes need to be made immediately.  Otherwise, you will receive a letter about your results with an explanation, but please check with MyChart first.  Please remember to sign up for MyChart if you have not done so, as this will be important to you in the future with finding out test results, communicating by private email, and scheduling acute appointments online when needed.  Please return in 1 year for your yearly visit, or sooner if needed, with Lab testing done 3-5 days before

## 2017-11-09 NOTE — Assessment & Plan Note (Signed)

## 2017-11-10 LAB — HIV ANTIBODY (ROUTINE TESTING W REFLEX): HIV 1&2 Ab, 4th Generation: NONREACTIVE

## 2017-11-13 ENCOUNTER — Ambulatory Visit (INDEPENDENT_AMBULATORY_CARE_PROVIDER_SITE_OTHER): Payer: 59 | Admitting: Psychology

## 2017-11-13 DIAGNOSIS — F4322 Adjustment disorder with anxiety: Secondary | ICD-10-CM | POA: Diagnosis not present

## 2017-11-14 NOTE — Telephone Encounter (Signed)
Ok with me 

## 2017-11-14 NOTE — Telephone Encounter (Signed)
Patient has dropped off a form to be completed for return to work, Her return to work date is 11/20/17.   Her Phycologist - Dr. Marlowe Sax is requesting she got back to work part time for the first week 05/20 - 05/24, & return to full - time after that. They stated the PCP had to fill out the form. Dr. Marlowe Sax states call her if you need to hear approval from her. [phone].   Please advise if you are ok with this being filled out. Thank you.

## 2017-11-15 NOTE — Telephone Encounter (Signed)
Form has been completed & placed in providers box to review and sign.  °

## 2017-11-16 NOTE — Telephone Encounter (Signed)
Form has been signed, faxed to [place] @ [phone], Copy sent to scan.   Patient informed & her copy is ready to be picked up.

## 2017-11-21 ENCOUNTER — Other Ambulatory Visit: Payer: Self-pay | Admitting: Urgent Care

## 2017-11-23 ENCOUNTER — Ambulatory Visit (INDEPENDENT_AMBULATORY_CARE_PROVIDER_SITE_OTHER): Payer: 59 | Admitting: Psychology

## 2017-11-23 DIAGNOSIS — F4323 Adjustment disorder with mixed anxiety and depressed mood: Secondary | ICD-10-CM

## 2017-11-23 NOTE — Telephone Encounter (Signed)
Called pt to see if she wanted to make an OV as needed for a med refill. Left VM for her to call back and make an appt if needed.

## 2017-11-30 ENCOUNTER — Telehealth: Payer: Self-pay

## 2017-11-30 ENCOUNTER — Ambulatory Visit (INDEPENDENT_AMBULATORY_CARE_PROVIDER_SITE_OTHER): Payer: 59 | Admitting: Psychology

## 2017-11-30 DIAGNOSIS — F321 Major depressive disorder, single episode, moderate: Secondary | ICD-10-CM | POA: Diagnosis not present

## 2017-11-30 MED ORDER — ESCITALOPRAM OXALATE 20 MG PO TABS: 20.0000 mg | ORAL_TABLET | Freq: Every day | ORAL | 3 refills | Status: DC

## 2017-11-30 NOTE — Telephone Encounter (Signed)
Ok, this is done 

## 2017-11-30 NOTE — Telephone Encounter (Signed)
Copied from [place] [id number]. Topic: Quick Communication - See Telephone Encounter >> Nov 30, 2017 10:32 AM Antonieta Iba C wrote: CRM for notification. See Telephone encounter for: 11/30/17.  Pt called in to ask if provider could increase her dosage for escitalopram (LEXAPRO) 10 MG tablet to 20 MG instead per Dr. Marlowe Sax her therapist. Pt would like to have Rx sent to :   [facility] on [address]

## 2017-11-30 NOTE — Addendum Note (Signed)
Addended by: Biagio Borg on: 11/30/2017 12:08 PM   Modules accepted: Orders

## 2017-12-04 ENCOUNTER — Ambulatory Visit (INDEPENDENT_AMBULATORY_CARE_PROVIDER_SITE_OTHER): Payer: Self-pay | Admitting: Psychology

## 2017-12-04 DIAGNOSIS — F321 Major depressive disorder, single episode, moderate: Secondary | ICD-10-CM

## 2017-12-07 ENCOUNTER — Ambulatory Visit: Payer: 59 | Admitting: Psychology

## 2017-12-11 ENCOUNTER — Ambulatory Visit (INDEPENDENT_AMBULATORY_CARE_PROVIDER_SITE_OTHER): Payer: 59 | Admitting: Psychology

## 2017-12-11 DIAGNOSIS — F321 Major depressive disorder, single episode, moderate: Secondary | ICD-10-CM

## 2017-12-19 ENCOUNTER — Ambulatory Visit (INDEPENDENT_AMBULATORY_CARE_PROVIDER_SITE_OTHER): Payer: 59 | Admitting: Psychology

## 2017-12-19 DIAGNOSIS — F321 Major depressive disorder, single episode, moderate: Secondary | ICD-10-CM

## 2017-12-25 ENCOUNTER — Other Ambulatory Visit: Payer: Self-pay

## 2017-12-25 ENCOUNTER — Ambulatory Visit (INDEPENDENT_AMBULATORY_CARE_PROVIDER_SITE_OTHER): Payer: 59 | Admitting: Psychology

## 2017-12-25 ENCOUNTER — Emergency Department (HOSPITAL_COMMUNITY)
Admission: EM | Admit: 2017-12-25 | Discharge: 2017-12-25 | Disposition: A | Discharge status: Home / Self Care | Payer: 59 | Attending: Emergency Medicine | Admitting: Emergency Medicine

## 2017-12-25 ENCOUNTER — Ambulatory Visit: Payer: Self-pay | Admitting: Internal Medicine

## 2017-12-25 ENCOUNTER — Encounter (HOSPITAL_COMMUNITY): Payer: Self-pay | Admitting: Emergency Medicine

## 2017-12-25 DIAGNOSIS — I10 Essential (primary) hypertension: Secondary | ICD-10-CM | POA: Diagnosis not present

## 2017-12-25 DIAGNOSIS — F17210 Nicotine dependence, cigarettes, uncomplicated: Secondary | ICD-10-CM | POA: Diagnosis not present

## 2017-12-25 DIAGNOSIS — J45909 Unspecified asthma, uncomplicated: Secondary | ICD-10-CM | POA: Insufficient documentation

## 2017-12-25 DIAGNOSIS — Z79899 Other long term (current) drug therapy: Secondary | ICD-10-CM | POA: Diagnosis not present

## 2017-12-25 DIAGNOSIS — F321 Major depressive disorder, single episode, moderate: Secondary | ICD-10-CM

## 2017-12-25 LAB — URINALYSIS, ROUTINE W REFLEX MICROSCOPIC
Bacteria, UA: NONE SEEN
Bilirubin Urine: NEGATIVE
Glucose, UA: NEGATIVE mg/dL
Ketones, ur: NEGATIVE mg/dL
Leukocytes, UA: NEGATIVE
Nitrite: NEGATIVE
Protein, ur: NEGATIVE mg/dL
Specific Gravity, Urine: 1.006 (ref 1.005–1.030)
pH: 7 (ref 5.0–8.0)

## 2017-12-25 LAB — CBC WITH DIFFERENTIAL/PLATELET
Basophils Absolute: 0 10*3/uL (ref 0.0–0.1)
Basophils Relative: 0 %
Eosinophils Absolute: 0.2 10*3/uL (ref 0.0–0.7)
Eosinophils Relative: 3 %
HCT: 42 % (ref 36.0–46.0)
Hemoglobin: 13.7 g/dL (ref 12.0–15.0)
Lymphocytes Relative: 49 %
Lymphs Abs: 4 10*3/uL (ref 0.7–4.0)
MCH: 27.2 pg (ref 26.0–34.0)
MCHC: 32.6 g/dL (ref 30.0–36.0)
MCV: 83.5 fL (ref 78.0–100.0)
Monocytes Absolute: 0.7 10*3/uL (ref 0.1–1.0)
Monocytes Relative: 8 %
Neutro Abs: 3.2 10*3/uL (ref 1.7–7.7)
Neutrophils Relative %: 40 %
Platelets: 256 10*3/uL (ref 150–400)
RBC: 5.03 MIL/uL (ref 3.87–5.11)
RDW: 15.3 % (ref 11.5–15.5)
WBC: 8.1 10*3/uL (ref 4.0–10.5)

## 2017-12-25 LAB — BASIC METABOLIC PANEL
Anion gap: 10 (ref 5–15)
BUN: 7 mg/dL (ref 6–20)
CO2: 29 mmol/L (ref 22–32)
Calcium: 9.6 mg/dL (ref 8.9–10.3)
Chloride: 103 mmol/L (ref 101–111)
Creatinine, Ser: 0.73 mg/dL (ref 0.44–1.00)
GFR calc Af Amer: >60 mL/min (ref >60)
GFR calc non Af Amer: >60 mL/min (ref >60)
Glucose, Bld: 89 mg/dL (ref 65–99)
Potassium: 3.7 mmol/L (ref 3.5–5.1)
Sodium: 142 mmol/L (ref 135–145)

## 2017-12-25 LAB — I-STAT BETA HCG BLOOD, ED (MC, WL, AP ONLY): I-stat hCG, quantitative: <5 m[IU]/mL (ref <5)

## 2017-12-25 LAB — I-STAT TROPONIN, ED: Troponin i, poc: 0 ng/mL (ref 0.00–0.08)

## 2017-12-25 MED ORDER — HYDROCHLOROTHIAZIDE 12.5 MG PO TABS: 12.5000 mg | ORAL_TABLET | Freq: Every day | ORAL | 0 refills | Status: DC

## 2017-12-25 MED ORDER — HYDROCHLOROTHIAZIDE 12.5 MG PO CAPS
25.0000 mg | ORAL_CAPSULE | Freq: Once | ORAL | Status: AC
  Administered 2017-12-25: 25 mg via ORAL
  Filled 2017-12-25: qty 2

## 2017-12-25 NOTE — Discharge Instructions (Signed)
Start taking hydrochlorothiazide as prescribed once daily.  Check your blood pressure 1 hour after taking this medication.  If your blood pressure drops below 120/80, stop taking this medication.  Also stop taking this medication if you become lightheaded or feel like you are going to pass out.  This medication may cause you to urinate excessively.  Follow-up with your PCP in the next 1 to 2 weeks for reevaluation of your symptoms.  Return to the emergency department immediately for any concerning signs or symptoms develop such as fevers, severe headaches, vomiting, chest pain, shortness of breath, decreased urination, slurred speech, or lightheadedness.

## 2017-12-25 NOTE — ED Notes (Signed)
EDPA Provider at bedside. 

## 2017-12-25 NOTE — ED Triage Notes (Signed)
Pt checked BP at home with meter that she has at home. Pt reports her was 180s/100s twice when took today.  Pt denies any hx HTN for her personally but it runs in her family.

## 2017-12-25 NOTE — ED Provider Notes (Signed)
[facility] DEPT Provider Note   CSN: [id number] Arrival date & time: 12/25/17  1748     History   Chief Complaint Chief Complaint  Patient presents with  . Hypertension    HPI Michelle Solomon is a 57 y.o. female with history of asthma, anxiety and depression presents for evaluation of acute onset, persisting hypertension for 3 days.  She states that last week she took her sister to the ED for evaluation of her high blood pressure.  On Friday 4 days ago she was with her sister at home and they both took her blood pressures.  She noted that her blood pressure was elevated around 400Q over 676 systolic.  She took 2 baby aspirin at that time.  Yesterday she took her blood pressure which read around 195K systolic.  She took 4 baby aspirin at that time.  She did note a mild dull frontal headache which did not radiate and came on gradually.  She states this feels like her usual headaches and resolved on its own.  She denies any associated nausea, vomiting, vision changes, numbness, tingling, weakness, photophobia, photophobia, trauma, or falls.  This morning she took her blood pressure twice and it was running around 180/100.  She has no known history of hypertension.  She is a current smoker of approximately half a pack of cigarettes daily.  She tells me that she has not been eating very well since the sudden death of her husband in 20-Sep-2022.  She states that she has been eating a lot of fast food and salty foods.  She denies any chest pain, shortness of breath, abdominal pain, or changes in urination.  The history is provided by the patient.    Past Medical History:  Diagnosis Date  . Acute bronchitis 06/06/2010  . ALLERGIC RHINITIS 05/29/2007  . Anal or rectal pain 06/23/2008  . ANEMIA-IRON DEFICIENCY 05/29/2007  . ASTHMA 05/29/2007  . 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 CONJUNCTIVITIS, ALLERGIC 11/27/2008  . HEMORRHOIDS 06/16/2008  . NIPPLE  DISCHARGE 01/21/2010  . Other specified forms of hearing loss 10/08/2009  . Overweight(278.02) 05/29/2007  . RASH-NONVESICULAR 05/29/2007  . Wheezing 06/22/2010    Patient Active Problem List   Diagnosis Date Noted  . Hyperglycemia 11/09/2017  . Grief reaction 09/20/2017  . Left sided sciatica 03/14/2017  . Acne 06/10/2016  . Frozen shoulder syndrome 05/12/2015  . Right shoulder pain 04/30/2015  . Breast pain, left 12/16/2014  . Smoker 12/16/2014  . Anxiety and depression 12/16/2014  . Hearing loss 04/05/2012  . Preventative health care 12/24/2010  . Back pain 12/24/2010  . CONJUNCTIVITIS, ALLERGIC 11/27/2008  . Anal or rectal pain 06/23/2008  . HEMORRHOIDS 06/16/2008  . 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 ANEMIA-IRON DEFICIENCY 05/29/2007  . ALLERGIC RHINITIS 05/29/2007  . ASTHMA 05/29/2007    History reviewed. No pertinent surgical history.   OB History   None      Home Medications    Prior to Admission medications   Medication Sig Start Date End Date Taking? Authorizing Provider  escitalopram (LEXAPRO) 20 MG tablet Take 1 tablet (20 mg total) by mouth daily. 11/30/17  Yes Biagio Borg, MD  hydrocortisone 2.5 % cream Apply topically 2 (two) times daily. Patient taking differently: Apply 1 application topically 2 (two) times daily as needed (inflammation).  11/09/17  Yes Biagio Borg, MD  loratadine (CLARITIN) 10 MG tablet Take 10 mg by mouth daily as needed for allergies.  Yes [provider]  ALPRAZolam (XANAX) 0.5 MG tablet Take 1 tablet (0.5 mg total) by mouth at bedtime as needed for anxiety or sleep. Patient not taking: Reported on 12/25/2017 09/20/17   Biagio Borg, MD  cyclobenzaprine (FLEXERIL) 5 MG tablet Take 1 tablet (5 mg total) by mouth 3 (three) times daily as needed. Patient not taking: Reported on 12/25/2017 09/06/17   Jaynee Eagles, PA-C  gabapentin (NEURONTIN) 100 MG capsule Take 1 capsule (100 mg total) by mouth 3 (three) times  daily. Patient not taking: Reported on 12/25/2017 03/14/17   Biagio Borg, MD  hydrochlorothiazide (HYDRODIURIL) 12.5 MG tablet Take 1 tablet (12.5 mg total) by mouth daily. 12/25/17   Cavin Longman A, PA-C  meloxicam (MOBIC) 15 MG tablet Take 0.5-1 tablets (7.5-15 mg total) by mouth daily. Patient not taking: Reported on 12/25/2017 09/06/17   Jaynee Eagles, PA-C    Family History Family History  Problem Relation Age of Onset  . Allergies Sister   . Cancer Other        prostate cancer  . Hypertension Other     Social History Social History   Tobacco Use  . Smoking status: Current Every Day Smoker    Types: Cigarettes  . Smokeless tobacco: Never Used  . Tobacco comment: 1 pack weekly  Substance Use Topics  . Alcohol use: Yes  . Drug use: No     Allergies   Patient has no known allergies.   Review of Systems Review of Systems  Constitutional: Negative for chills and fever.  Respiratory: Negative for shortness of breath.   Cardiovascular: Negative for chest pain.  Gastrointestinal: Negative for abdominal pain, nausea and vomiting.  Neurological: Positive for headaches (Resolved).  All other systems reviewed and are negative.    Physical Exam Updated Vital Signs BP (!) 155/96 (BP Location: Left Arm)   Pulse 68   Temp 98.3 F (36.8 C) (Oral)   Resp 20   Ht 5\' 3"  (1.6 m)   Wt 88.5 kg (195 lb)   LMP 10/26/2017   SpO2 100%   BMI 34.54 kg/m   Physical Exam  Constitutional: She is oriented to person, place, and time. She appears well-developed and well-nourished. No distress.  HENT:  Head: Normocephalic and atraumatic.  Eyes: Pupils are equal, round, and reactive to light. Conjunctivae and EOM are normal. Right eye exhibits no discharge. Left eye exhibits no discharge.  Neck: Normal range of motion. Neck supple. No JVD present. No tracheal deviation present.  Cardiovascular: Normal rate, regular rhythm, normal heart sounds and intact distal pulses.  2+ radial and DP/PT  pulses bilaterally, Homans sign absent bilaterally, no lower extremity edema, no palpable cords, compartments are soft  Pulmonary/Chest: Effort normal and breath sounds normal. She exhibits no tenderness.  Abdominal: Soft. Bowel sounds are normal. She exhibits no distension. There is no tenderness.  Musculoskeletal: Normal range of motion. She exhibits no edema or tenderness.  Neurological: She is alert and oriented to person, place, and time. No cranial nerve deficit or sensory deficit. She exhibits normal muscle tone.  Mental Status:  Alert, thought content appropriate, able to give a coherent history. Speech fluent without evidence of aphasia. Able to follow 2 step commands without difficulty.  Cranial Nerves:  II:  Peripheral visual fields grossly normal, pupils equal, round, reactive to light III,IV, VI: ptosis not present, extra-ocular motions intact bilaterally  V,VII: smile symmetric, facial light touch sensation equal VIII: hearing grossly normal to voice  X: uvula elevates  symmetrically  XI: bilateral shoulder shrug symmetric and strong XII: midline tongue extension without fassiculations Motor:  Normal tone. 5/5 strength of BUE and BLE major muscle groups including strong and equal grip strength and dorsiflexion/plantar flexion Sensory: light touch normal in all extremities. Cerebellar: normal finger-to-nose with bilateral upper extremities Gait: normal gait and balance. Able to walk on toes and heels with ease.  No pronator drift   Skin: Skin is warm and dry. No erythema.  Psychiatric: She has a normal mood and affect. Her behavior is normal.  Nursing note and vitals reviewed.    ED Treatments / Results  Labs (all labs ordered are listed, but only abnormal results are displayed) Labs Reviewed  URINALYSIS, ROUTINE W REFLEX MICROSCOPIC - Abnormal; Notable for the following components:      Result Value   Color, Urine STRAW (*)    Hgb urine dipstick SMALL (*)    All other  components within normal limits  BASIC METABOLIC PANEL  CBC WITH DIFFERENTIAL/PLATELET  I-STAT BETA HCG BLOOD, ED ([facility], [facility], [facility] ONLY)  I-STAT TROPONIN, ED    EKG EKG Interpretation  Date/Time:  Monday December 25 2017 21:51:35 EDT Ventricular Rate:  74 PR Interval:    QRS Duration: 87 QT Interval:  391 QTC Calculation: 434 R Axis:   59 Text Interpretation:  Sinus rhythm No old tracing to compare Confirmed by Dorie Rank [phone]) on 12/25/2017 9:58:52 PM Also confirmed by Dorie Rank [phone]), editor Hattie Perch ([id number])  on 12/26/2017 7:12:08 AM   Radiology No results found.  Procedures Procedures (including critical care time)  Medications Ordered in ED Medications  hydrochlorothiazide (MICROZIDE) capsule 25 mg (25 mg Oral Given 12/25/17 2100)     Initial Impression / Assessment and Plan / ED Course  I have reviewed the triage vital signs and the nursing notes.  Pertinent labs & imaging results that were available during my care of the patient were reviewed by me and considered in my medical decision making (see chart for details).     Patient presents for evaluation of asymptomatic hypertension.  No known history of hypertension and chart review shows that her baseline blood pressure typically runs around 120/80.  Initial blood pressure 200/121.  Remainder vital signs are stable.  She is nontoxic in appearance.  No focal neurologic deficits.  Had a mild headache typical of her usual headaches yesterday which resolved on its own, no red flag signs concerning for [facility], ICH, CVA, or other acute intracranial abnormality.  No complaint of chest pain and her troponin is negative.  EKG shows normal sinus rhythm, no ischemic changes noted.  No concern for ACS, MI, PE, or dissection.  Remainder of lab work reviewed by me is reassuring, no evidence of endorgan damage.  No leukocytosis, no anemia, creatinine within normal limits.  She was given 25 mg of HCTZ p.o. in the ED and on reevaluation  her blood pressure has improved to 155/96.  She is resting comfortably, tolerating p.o. food and fluids in the ED on reevaluation.  Stable for discharge home with follow-up with her PCP for reevaluation.  We will start her on low-dose HCTZ in the meantime.  Discussed strict ED return precautions. Pt verbalized understanding of and agreement with plan and is safe for discharge home at this time.   Final Clinical Impressions(s) / ED Diagnoses   Final diagnoses:  Asymptomatic hypertension    ED Discharge Orders        Ordered    hydrochlorothiazide (HYDRODIURIL) 12.5  MG tablet  Daily     12/25/17 2218       Debroah Baller 12/26/17 1708    Dorie Rank, MD 12/26/17 2342

## 2017-12-25 NOTE — Telephone Encounter (Signed)
BP  Elevated yest Only symptoms was headache last pm. Advised to go ER now    Reason for Disposition . [3] Systolic BP  >= 568 OR Diastolic >= 616  AND [8] having NO cardiac or neurologic symptoms  Answer Assessment - Initial Assessment Questions 1. BLOOD PRESSURE: "What is the blood pressure?" "Did you take at least two measurements 5 minutes apart?"       184/132   180/123  2. ONSET: "When did you take your blood pressure?"        10  mins ago   3. HOW: "How did you obtain the blood pressure?" (e.g., visiting nurse, automatic home BP monitor)      Automatic bp machine   4. HISTORY: "Do you have a history of high blood pressure?"        No 5. MEDICATIONS: "Are you taking any medications for blood pressure?" "Have you missed any doses recently?"     n/a 6. OTHER SYMPTOMS: "Do you have any symptoms?" (e.g., headache, chest pain, blurred vision, difficulty breathing, weakness)      Headache list night sweaty  bp  Was  Elevated last night   7. PREGNANCY: "Is there any chance you are pregnant?" "When was your last menstrual period?"     Peri  menapausal  Protocols used: HIGH BLOOD PRESSURE-A-AH

## 2017-12-28 ENCOUNTER — Encounter: Payer: Self-pay | Admitting: Internal Medicine

## 2017-12-28 ENCOUNTER — Ambulatory Visit (INDEPENDENT_AMBULATORY_CARE_PROVIDER_SITE_OTHER): Payer: 59 | Admitting: Internal Medicine

## 2017-12-28 VITALS — BP 118/68 | HR 100 | Temp 98.0°F | Ht 63.0 in | Wt 187.0 lb

## 2017-12-28 DIAGNOSIS — F419 Anxiety disorder, unspecified: Secondary | ICD-10-CM | POA: Diagnosis not present

## 2017-12-28 DIAGNOSIS — F32A Depression, unspecified: Secondary | ICD-10-CM

## 2017-12-28 DIAGNOSIS — R739 Hyperglycemia, unspecified: Secondary | ICD-10-CM | POA: Diagnosis not present

## 2017-12-28 DIAGNOSIS — F329 Major depressive disorder, single episode, unspecified: Secondary | ICD-10-CM

## 2017-12-28 DIAGNOSIS — R232 Flushing: Secondary | ICD-10-CM

## 2017-12-28 NOTE — Assessment & Plan Note (Signed)
[place]oco for [facility] level, but suspect more likely psychiatric situational

## 2017-12-28 NOTE — Assessment & Plan Note (Signed)
Improved, cont counseling, form signed today to return to work July 8 with 1 wk of 4 hours per day, then full time after the first week

## 2017-12-28 NOTE — Patient Instructions (Signed)
Please continue all other medications as before, and refills have been done if requested.  Please have the pharmacy call with any other refills you may need.  Please continue your efforts at being more active, low cholesterol diet, and weight control.  Please keep your appointments with your specialists as you may have planned - counseling  OK for work note as discussed

## 2017-12-28 NOTE — Assessment & Plan Note (Signed)
stable overall by history and exam, recent data reviewed with pt, and pt to continue medical treatment as before,  to f/u any worsening symptoms or concerns Lab Results  Component Value Date   HGBA1C 5.6 11/09/2017

## 2017-12-28 NOTE — Progress Notes (Signed)
Subjective:    Patient ID: Michelle Solomon, female    DOB: 01-08-1961, 57 y.o.   MRN: [id number]  HPI  Here to f/u elev BP seen at ED June 24 with severe HTN, tx with hct 12.5 qd after ECG and labs neg for acute.  Pt denies chest pain, increased sob or doe, wheezing, orthopnea, PND, increased LE swelling, palpitations, dizziness or syncope. Now overall feeling much improved. Pt denies new neurological symptoms such as new headache, or facial or extremity weakness or numbness No leg cramping.  Pt denies fever, wt loss, night sweats, loss of appetite, or other constitutional symptoms except having frequent hot flashed since her husband died 03-20-2019BP Readings from Last 3 Encounters:  12/28/17 118/68  12/25/17 (!) 155/96  11/09/17 120/90   Wt Readings from Last 3 Encounters:  12/28/17 187 lb (84.8 kg)  12/25/17 195 lb (88.5 kg)  11/09/17 193 lb (87.5 kg)  Denies worsening depressive symptoms, suicidal ideation, or panic; has ongoing anxiety, now seeing counseling and had been off work again per mental health provider.  Needs note from [place] to say ok to return to work/. Past Medical History:  Diagnosis Date  . Acute bronchitis 06/06/2010  . ALLERGIC RHINITIS 05/29/2007  . Anal or rectal pain 06/23/2008  . ANEMIA-IRON DEFICIENCY 05/29/2007  . ASTHMA 05/29/2007  . 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 CONJUNCTIVITIS, ALLERGIC 11/27/2008  . HEMORRHOIDS 06/16/2008  . NIPPLE DISCHARGE 01/21/2010  . Other specified forms of hearing loss 10/08/2009  . Overweight(278.02) 05/29/2007  . RASH-NONVESICULAR 05/29/2007  . Wheezing 06/22/2010   No past surgical history on file.  reports that she has been smoking cigarettes.  She has never used smokeless tobacco. She reports that she drinks alcohol. She reports that she does not use drugs. family history includes Allergies in her sister; Cancer in her other; Hypertension in her other. No Known Allergies Current Outpatient Medications on File  Prior to Visit  Medication Sig Dispense Refill  . ALPRAZolam (XANAX) 0.5 MG tablet Take 1 tablet (0.5 mg total) by mouth at bedtime as needed for anxiety or sleep. 30 tablet 2  . cyclobenzaprine (FLEXERIL) 5 MG tablet Take 1 tablet (5 mg total) by mouth 3 (three) times daily as needed. 90 tablet 1  . escitalopram (LEXAPRO) 20 MG tablet Take 1 tablet (20 mg total) by mouth daily. 90 tablet 3  . gabapentin (NEURONTIN) 100 MG capsule Take 1 capsule (100 mg total) by mouth 3 (three) times daily. 90 capsule 3  . hydrochlorothiazide (HYDRODIURIL) 12.5 MG tablet Take 1 tablet (12.5 mg total) by mouth daily. 30 tablet 0  . hydrocortisone 2.5 % cream Apply topically 2 (two) times daily. (Patient taking differently: Apply 1 application topically 2 (two) times daily as needed (inflammation). ) 30 g 1  . loratadine (CLARITIN) 10 MG tablet Take 10 mg by mouth daily as needed for allergies.    . meloxicam (MOBIC) 15 MG tablet Take 0.5-1 tablets (7.5-15 mg total) by mouth daily. 30 tablet 0   No current facility-administered medications on file prior to visit.    Review of Systems  Constitutional: Negative for other unusual diaphoresis or sweats HENT: Negative for ear discharge or swelling Eyes: Negative for other worsening visual disturbances Respiratory: Negative for stridor or other swelling  Gastrointestinal: Negative for worsening distension or other blood Genitourinary: Negative for retention or other urinary change Musculoskeletal: Negative for other MSK pain or swelling Skin: Negative for color change or other new  lesions Neurological: Negative for worsening tremors and other numbness  Psychiatric/Behavioral: Negative for worsening agitation or other fatigue All other system neg per pt    Objective:   Physical Exam BP 118/68   Pulse 100   Temp 98 F (36.7 C) (Oral)   Ht 5\' 3"  (1.6 m)   Wt 187 lb (84.8 kg)   SpO2 96%   BMI 33.13 kg/m  VS noted,  Constitutional: Pt appears in NAD HENT:  Head: NCAT.  Right Ear: External ear normal.  Left Ear: External ear normal.  Eyes: . Pupils are equal, round, and reactive to light. Conjunctivae and EOM are normal Nose: without d/c or deformity Neck: Neck supple. Gross normal ROM Cardiovascular: Normal rate and regular rhythm.   Pulmonary/Chest: Effort normal and breath sounds without rales or wheezing.  Abd:  Soft, NT, ND, + BS, no organomegaly Neurological: Pt is alert. At baseline orientation, motor grossly intact Skin: Skin is warm. No rashes, other new lesions, no LE edema Psychiatric: Pt behavior is normal without agitation , 1+ nervous, mild dysphoric No other exam findings Lab Results  Component Value Date   WBC 8.1 12/25/2017   HGB 13.7 12/25/2017   HCT 42.0 12/25/2017   PLT 256 12/25/2017   GLUCOSE 89 12/25/2017   CHOL 155 11/09/2017   TRIG 60.0 11/09/2017   HDL 59.20 11/09/2017   LDLCALC 83 11/09/2017   ALT 11 11/09/2017   AST 14 11/09/2017   NA 142 12/25/2017   K 3.7 12/25/2017   CL 103 12/25/2017   CREATININE 0.73 12/25/2017   BUN 7 12/25/2017   CO2 29 12/25/2017   TSH 0.83 11/09/2017   HGBA1C 5.6 11/09/2017       Assessment & Plan:

## 2018-01-10 ENCOUNTER — Ambulatory Visit: Payer: Self-pay | Admitting: Psychology

## 2018-01-10 ENCOUNTER — Encounter: Payer: Self-pay | Admitting: Internal Medicine

## 2018-01-10 ENCOUNTER — Ambulatory Visit (INDEPENDENT_AMBULATORY_CARE_PROVIDER_SITE_OTHER): Payer: 59 | Admitting: Psychology

## 2018-01-10 DIAGNOSIS — F321 Major depressive disorder, single episode, moderate: Secondary | ICD-10-CM | POA: Diagnosis not present

## 2018-01-15 ENCOUNTER — Ambulatory Visit: Payer: Self-pay | Admitting: Psychology

## 2018-01-17 ENCOUNTER — Ambulatory Visit: Payer: Self-pay | Admitting: Psychology

## 2018-01-24 ENCOUNTER — Ambulatory Visit (INDEPENDENT_AMBULATORY_CARE_PROVIDER_SITE_OTHER): Payer: 59 | Admitting: Psychology

## 2018-01-24 DIAGNOSIS — F321 Major depressive disorder, single episode, moderate: Secondary | ICD-10-CM

## 2018-02-02 ENCOUNTER — Ambulatory Visit: Payer: Self-pay | Admitting: Internal Medicine

## 2018-02-02 ENCOUNTER — Other Ambulatory Visit: Payer: Self-pay | Admitting: Internal Medicine

## 2018-02-02 NOTE — Telephone Encounter (Signed)
i'm not showing a blood pressure medicine on patient's chart currently being prescribed by dr john---I have left message advising patient to contact the doctor that prescribed whatever medication she is needing, unless she can call back with name of medication, I dont Think this is a medication we prescribe for her

## 2018-02-02 NOTE — Telephone Encounter (Signed)
Copied from [place] [id number]. Topic: Quick Communication - Rx Refill/Question >> Feb 02, 2018  1:56 PM Burchel, Abbi R wrote: Medication: hydrochlorothiazide (HYDRODIURIL) 12.5 MG tablet   Preferred Pharmacy:[id number] [place], [place] - [address] [place] [address] Phone: [phone] Fax: [phone]    Pt was advised of [facility] rx refill policy.  Pt asked to have rx sent to pharmacy near her while she is out of town.

## 2018-02-02 NOTE — Telephone Encounter (Signed)
Hydrochlorothiazide refill. Previously filled by another provider Last Refill:12/25/17 #30 Last OV: 12/28/17 PCP: Dr. Jenny Reichmann [facility] [place]  [id number]

## 2018-02-02 NOTE — Telephone Encounter (Signed)
Patient is calling to request blood pressure medication- she is out of town- her BP is 182/101. Call dropped. Called patient back- she is in [place] for family reunion and she is not on medication. Advised patient to go to ED for treatment of high blood pressure.Patient states she has a few pills left and her son is coming tonight- advised her not to wait.  Reason for Disposition . [1] Systolic BP  >= 155 OR Diastolic >= 208 AND [0] cardiac or neurologic symptoms (e.g., chest pain, difficulty breathing, unsteady gait, blurred vision)  Answer Assessment - Initial Assessment Questions 1. BLOOD PRESSURE: "What is the blood pressure?" "Did you take at least two measurements 5 minutes apart?"     182/101 2. ONSET: "When did you take your blood pressure?"     At store pharmacy- 3. HOW: "How did you obtain the blood pressure?" (e.g., visiting nurse, automatic home BP monitor)     Automatic machine 4. HISTORY: "Do you have a history of high blood pressure?"     Yes- ED visit in June and was given BP medication 5. MEDICATIONS: "Are you taking any medications for blood pressure?" "Have you missed any doses recently?"     Patient ran out of BP medication- she has not been BP regularly 6. OTHER SYMPTOMS: "Do you have any symptoms?" (e.g., headache, chest pain, blurred vision, difficulty breathing, weakness)     headache 7. PREGNANCY: "Is there any chance you are pregnant?" "When was your last menstrual period?"     n/a  Protocols used: HIGH BLOOD PRESSURE-A-AH

## 2018-02-03 DIAGNOSIS — I10 Essential (primary) hypertension: Secondary | ICD-10-CM | POA: Diagnosis not present

## 2018-02-03 DIAGNOSIS — Z8249 Family history of ischemic heart disease and other diseases of the circulatory system: Secondary | ICD-10-CM | POA: Diagnosis not present

## 2018-02-05 MED ORDER — HYDROCHLOROTHIAZIDE 12.5 MG PO TABS: 12.5000 mg | ORAL_TABLET | Freq: Every day | ORAL | 5 refills | Status: DC

## 2018-02-06 ENCOUNTER — Ambulatory Visit: Payer: Self-pay | Admitting: Internal Medicine

## 2018-02-06 NOTE — Telephone Encounter (Signed)
She called c/o her BP being high while she was in [place] for her husband's family reunion.   Her husband passed away in 19-Sep-2022. Of this year so it was a very emotional time for her. She ended up going to the ED because her BP was elevated.   They gave her some hydrochlorothiazide enough to get her home.   Since she has been home her BP has been fine.   She checked it while we were talking and it was 144/99.   See triage notes for her other reading last night.   It was fine.    I let her know it would still be a good idea to be seen by Dr. Jenny Reichmann and make sure she did not need to be on medication for the BP.  I scheduled her with Dr. Jenny Reichmann for 02/12/18 at 2:20.     Reason for Disposition . [2] Systolic BP  >= 395 OR Diastolic >= 80 AND [3] not taking BP medications  Answer Assessment - Initial Assessment Questions 1. BLOOD PRESSURE: "What is the blood pressure?" "Did you take at least two measurements 5 minutes apart?"     My BP was running high while in [place].   I went to the ED and they got it back under control.      They gave me some hydrochlorothiade to get me home.    I called Dr. Gwynn Burly office and they couldn't prescribe it.   [facility]    2. ONSET: "When did you take your blood pressure?"    110/58 last night.   3. HOW: "How did you obtain the blood pressure?" (e.g., visiting nurse, automatic home BP monitor)     Home BP machine. 4. HISTORY: "Do you have a history of high blood pressure?"     No 5. MEDICATIONS: "Are you taking any medications for blood pressure?" "Have you missed any doses recently?"     No 6. OTHER SYMPTOMS: "Do you have any symptoms?" (e.g., headache, chest pain, blurred vision, difficulty breathing, weakness)    I was in [place] for my husband's family reunion.    My husband died in 2022/09/19.   That could be what elevated my BP.     144/99 now. 7. PREGNANCY: "Is there any chance you are pregnant?" "When was your last menstrual period?"     Not  asked  Protocols used: HIGH BLOOD PRESSURE-A-AH

## 2018-02-07 ENCOUNTER — Ambulatory Visit (INDEPENDENT_AMBULATORY_CARE_PROVIDER_SITE_OTHER): Payer: 59 | Admitting: Psychology

## 2018-02-07 DIAGNOSIS — F321 Major depressive disorder, single episode, moderate: Secondary | ICD-10-CM | POA: Diagnosis not present

## 2018-02-12 ENCOUNTER — Ambulatory Visit: Payer: Self-pay | Admitting: Internal Medicine

## 2018-02-20 ENCOUNTER — Ambulatory Visit (INDEPENDENT_AMBULATORY_CARE_PROVIDER_SITE_OTHER): Payer: 59 | Admitting: Psychology

## 2018-02-20 ENCOUNTER — Ambulatory Visit: Payer: 59 | Admitting: Psychology

## 2018-02-20 DIAGNOSIS — F321 Major depressive disorder, single episode, moderate: Secondary | ICD-10-CM

## 2018-03-08 ENCOUNTER — Ambulatory Visit: Payer: 59 | Admitting: Psychology

## 2018-03-13 ENCOUNTER — Ambulatory Visit (INDEPENDENT_AMBULATORY_CARE_PROVIDER_SITE_OTHER): Payer: 59 | Admitting: Psychology

## 2018-03-13 DIAGNOSIS — F321 Major depressive disorder, single episode, moderate: Secondary | ICD-10-CM

## 2018-03-28 ENCOUNTER — Ambulatory Visit: Payer: 59 | Admitting: Psychology

## 2018-04-11 ENCOUNTER — Ambulatory Visit (INDEPENDENT_AMBULATORY_CARE_PROVIDER_SITE_OTHER): Payer: 59 | Admitting: Psychology

## 2018-04-11 DIAGNOSIS — F321 Major depressive disorder, single episode, moderate: Secondary | ICD-10-CM

## 2018-04-12 ENCOUNTER — Telehealth: Payer: Self-pay | Admitting: Emergency Medicine

## 2018-04-12 NOTE — Telephone Encounter (Signed)
error 

## 2018-04-18 ENCOUNTER — Encounter: Payer: Self-pay | Admitting: Internal Medicine

## 2018-04-18 ENCOUNTER — Ambulatory Visit (INDEPENDENT_AMBULATORY_CARE_PROVIDER_SITE_OTHER): Payer: 59 | Admitting: Internal Medicine

## 2018-04-18 VITALS — BP 128/82 | HR 84 | Temp 97.9°F | Ht 63.0 in | Wt 179.0 lb

## 2018-04-18 DIAGNOSIS — F17200 Nicotine dependence, unspecified, uncomplicated: Secondary | ICD-10-CM | POA: Diagnosis not present

## 2018-04-18 DIAGNOSIS — R739 Hyperglycemia, unspecified: Secondary | ICD-10-CM

## 2018-04-18 DIAGNOSIS — F419 Anxiety disorder, unspecified: Secondary | ICD-10-CM | POA: Diagnosis not present

## 2018-04-18 DIAGNOSIS — F32A Depression, unspecified: Secondary | ICD-10-CM

## 2018-04-18 DIAGNOSIS — G4700 Insomnia, unspecified: Secondary | ICD-10-CM | POA: Diagnosis not present

## 2018-04-18 DIAGNOSIS — F329 Major depressive disorder, single episode, unspecified: Secondary | ICD-10-CM

## 2018-04-18 MED ORDER — ZOLPIDEM TARTRATE 10 MG PO TABS: 10.0000 mg | ORAL_TABLET | Freq: Every evening | ORAL | 5 refills | Status: DC | PRN

## 2018-04-18 MED ORDER — BUPROPION HCL ER (XL) 150 MG PO TB24: 150.0000 mg | ORAL_TABLET | Freq: Every day | ORAL | 3 refills | Status: DC

## 2018-04-18 NOTE — Progress Notes (Signed)
Subjective:    Patient ID: Michelle Solomon, female    DOB: September 24, 1960, 57 y.o.   MRN: [id number]  HPI  Here with c/o persisent depression symptoms and difficulty sleeping, worse overall since her husband passed feb 2019; some better with lexapro and counseling, but was suggested especially with her increased smoking that wellbutrin might be a good addition. Denies suicidal ideation, or panic; has ongoing anxiety, not increased recently.   Pt denies chest pain, increased sob or doe, wheezing, orthopnea, PND, increased LE swelling, palpitations, dizziness or syncope.  Pt denies new neurological symptoms such as new headache, or facial or extremity weakness or numbness   Pt denies polydipsia, polyuria Past Medical History:  Diagnosis Date  . Acute bronchitis 06/06/2010  . ALLERGIC RHINITIS 05/29/2007  . Anal or rectal pain 06/23/2008  . ANEMIA-IRON DEFICIENCY 05/29/2007  . ASTHMA 05/29/2007  . 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 CONJUNCTIVITIS, ALLERGIC 11/27/2008  . HEMORRHOIDS 06/16/2008  . NIPPLE DISCHARGE 01/21/2010  . Other specified forms of hearing loss 10/08/2009  . Overweight(278.02) 05/29/2007  . RASH-NONVESICULAR 05/29/2007  . Wheezing 06/22/2010   No past surgical history on file.  reports that she has been smoking cigarettes. She has never used smokeless tobacco. She reports that she drinks alcohol. She reports that she does not use drugs. family history includes Allergies in her sister; Cancer in her other; Hypertension in her other. No Known Allergies Current Outpatient Medications on File Prior to Visit  Medication Sig Dispense Refill  . ALPRAZolam (XANAX) 0.5 MG tablet Take 1 tablet (0.5 mg total) by mouth at bedtime as needed for anxiety or sleep. 30 tablet 2  . cyclobenzaprine (FLEXERIL) 5 MG tablet Take 1 tablet (5 mg total) by mouth 3 (three) times daily as needed. 90 tablet 1  . escitalopram (LEXAPRO) 20 MG tablet Take 1 tablet (20 mg total) by mouth  daily. 90 tablet 3  . gabapentin (NEURONTIN) 100 MG capsule Take 1 capsule (100 mg total) by mouth 3 (three) times daily. 90 capsule 3  . hydrochlorothiazide (HYDRODIURIL) 12.5 MG tablet Take 1 tablet (12.5 mg total) by mouth daily. 30 tablet 5  . hydrocortisone 2.5 % cream Apply topically 2 (two) times daily. (Patient taking differently: Apply 1 application topically 2 (two) times daily as needed (inflammation). ) 30 g 1  . loratadine (CLARITIN) 10 MG tablet Take 10 mg by mouth daily as needed for allergies.    . meloxicam (MOBIC) 15 MG tablet Take 0.5-1 tablets (7.5-15 mg total) by mouth daily. 30 tablet 0   No current facility-administered medications on file prior to visit.    Review of Systems  Constitutional: Negative for other unusual diaphoresis or sweats HENT: Negative for ear discharge or swelling Eyes: Negative for other worsening visual disturbances Respiratory: Negative for stridor or other swelling  Gastrointestinal: Negative for worsening distension or other blood Genitourinary: Negative for retention or other urinary change Musculoskeletal: Negative for other MSK pain or swelling Skin: Negative for color change or other new lesions Neurological: Negative for worsening tremors and other numbness  Psychiatric/Behavioral: Negative for worsening agitation or other fatigue All other system neg per pt    Objective:   Physical Exam BP 128/82   Pulse 84   Temp 97.9 F (36.6 C) (Oral)   Ht 5\' 3"  (1.6 m)   Wt 179 lb (81.2 kg)   SpO2 98%   BMI 31.71 kg/m  VS noted,  Constitutional: Pt appears in NAD HENT: Head: NCAT.  Right Ear: External ear normal.  Left Ear: External ear normal.  Eyes: . Pupils are equal, round, and reactive to light. Conjunctivae and EOM are normal Nose: without d/c or deformity Neck: Neck supple. Gross normal ROM Cardiovascular: Normal rate and regular rhythm.   Pulmonary/Chest: Effort normal and breath sounds without rales or wheezing.    Neurological: Pt is alert. At baseline orientation, motor grossly intact Skin: Skin is warm. No rashes, other new lesions, no LE edema Psychiatric: Pt behavior is normal without agitation , + depressed affect No other exam findings  Lab Results  Component Value Date   WBC 8.1 12/25/2017   HGB 13.7 12/25/2017   HCT 42.0 12/25/2017   PLT 256 12/25/2017   GLUCOSE 89 12/25/2017   CHOL 155 11/09/2017   TRIG 60.0 11/09/2017   HDL 59.20 11/09/2017   LDLCALC 83 11/09/2017   ALT 11 11/09/2017   AST 14 11/09/2017   NA 142 12/25/2017   K 3.7 12/25/2017   CL 103 12/25/2017   CREATININE 0.73 12/25/2017   BUN 7 12/25/2017   CO2 29 12/25/2017   TSH 0.83 11/09/2017   HGBA1C 5.6 11/09/2017       Assessment & Plan:

## 2018-04-18 NOTE — Patient Instructions (Addendum)
Please take all new medication as prescribed - the wellbutrin ER 150 mg per day;  And call in 3-4 wks if you think you should have the higher dose at 300 mg  Please take all new medication as prescribed - the ambien as needed for sleep  Please continue all other medications as before, and refills have been done if requested.  Please have the pharmacy call with any other refills you may need.  Please keep your appointments with your specialists as you may have planned - counseling  Please return about May 2020, or sooner if needed, with Lab testing done 3-5 days before

## 2018-04-18 NOTE — Assessment & Plan Note (Signed)
Ok to add wellbyutrin xl 150, consider increase to 300 in 3-4 wks if not improved, cont all other tx and counseling

## 2018-04-18 NOTE — Assessment & Plan Note (Signed)
stable overall by history and exam, recent data reviewed with pt, and pt to continue medical treatment as before,  to f/u any worsening symptoms or concerns  

## 2018-04-18 NOTE — Assessment & Plan Note (Signed)
Urged to quit 

## 2018-04-18 NOTE — Assessment & Plan Note (Signed)
Ok for [employer] qhs prn, consider change to xanax qhs prn that she has done well in the past if [place] not working well

## 2018-04-20 ENCOUNTER — Ambulatory Visit: Payer: Self-pay | Admitting: Internal Medicine

## 2018-05-01 DIAGNOSIS — R921 Mammographic calcification found on diagnostic imaging of breast: Secondary | ICD-10-CM | POA: Diagnosis not present

## 2018-05-01 LAB — HM MAMMOGRAPHY

## 2018-05-17 ENCOUNTER — Ambulatory Visit: Payer: Self-pay | Admitting: Psychology

## 2018-07-13 ENCOUNTER — Ambulatory Visit (INDEPENDENT_AMBULATORY_CARE_PROVIDER_SITE_OTHER): Payer: 59 | Admitting: Family

## 2018-07-13 ENCOUNTER — Encounter: Payer: Self-pay | Admitting: Family

## 2018-07-13 VITALS — BP 132/76 | HR 92 | Temp 98.5°F | Ht 63.0 in | Wt 180.0 lb

## 2018-07-13 DIAGNOSIS — J019 Acute sinusitis, unspecified: Secondary | ICD-10-CM

## 2018-07-13 MED ORDER — AMOXICILLIN-POT CLAVULANATE 875-125 MG PO TABS: 1.0000 | ORAL_TABLET | Freq: Two times a day (BID) | ORAL | 0 refills | Status: DC

## 2018-07-13 NOTE — Progress Notes (Signed)
Michelle Solomon is a 58 y.o. female with the following history as recorded in Epic[facility]:  Patient Active Problem List   Diagnosis Date Noted  . Insomnia 04/18/2018  . Hot flashes 12/28/2017  . Hyperglycemia 11/09/2017  . Grief reaction 09/20/2017  . Left sided sciatica 03/14/2017  . Acne 06/10/2016  . Frozen shoulder syndrome 05/12/2015  . Right shoulder pain 04/30/2015  . Breast pain, left 12/16/2014  . Smoker 12/16/2014  . Anxiety and depression 12/16/2014  . Hearing loss 04/05/2012  . Preventative health care 12/24/2010  . Back pain 12/24/2010  . CONJUNCTIVITIS, ALLERGIC 11/27/2008  . Anal or rectal pain 06/23/2008  . HEMORRHOIDS 06/16/2008  . 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 ANEMIA-IRON DEFICIENCY 05/29/2007  . ALLERGIC RHINITIS 05/29/2007  . ASTHMA 05/29/2007    Current Outpatient Medications  Medication Sig Dispense Refill  . ALPRAZolam (XANAX) 0.5 MG tablet Take 1 tablet (0.5 mg total) by mouth at bedtime as needed for anxiety or sleep. 30 tablet 2  . buPROPion (WELLBUTRIN XL) 150 MG 24 hr tablet Take 1 tablet (150 mg total) by mouth daily. 90 tablet 3  . cyclobenzaprine (FLEXERIL) 5 MG tablet Take 1 tablet (5 mg total) by mouth 3 (three) times daily as needed. 90 tablet 1  . escitalopram (LEXAPRO) 20 MG tablet Take 1 tablet (20 mg total) by mouth daily. 90 tablet 3  . gabapentin (NEURONTIN) 100 MG capsule Take 1 capsule (100 mg total) by mouth 3 (three) times daily. 90 capsule 3  . hydrochlorothiazide (HYDRODIURIL) 12.5 MG tablet Take 1 tablet (12.5 mg total) by mouth daily. 30 tablet 5  . hydrocortisone 2.5 % cream Apply topically 2 (two) times daily. (Patient taking differently: Apply 1 application topically 2 (two) times daily as needed (inflammation). ) 30 g 1  . loratadine (CLARITIN) 10 MG tablet Take 10 mg by mouth daily as needed for allergies.    . meloxicam (MOBIC) 15 MG tablet Take 0.5-1 tablets (7.5-15 mg total) by mouth daily. 30 tablet 0  .  amoxicillin-clavulanate (AUGMENTIN) 875-125 MG tablet Take 1 tablet by mouth 2 (two) times daily. 20 tablet 0  . zolpidem (AMBIEN) 10 MG tablet Take 1 tablet (10 mg total) by mouth at bedtime as needed for sleep. 30 tablet 5   No current facility-administered medications for this visit.     Allergies: Patient has no known allergies.  Past Medical History:  Diagnosis Date  . Acute bronchitis 06/06/2010  . ALLERGIC RHINITIS 05/29/2007  . Anal or rectal pain 06/23/2008  . ANEMIA-IRON DEFICIENCY 05/29/2007  . ASTHMA 05/29/2007  . 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 CONJUNCTIVITIS, ALLERGIC 11/27/2008  . HEMORRHOIDS 06/16/2008  . NIPPLE DISCHARGE 01/21/2010  . Other specified forms of hearing loss 10/08/2009  . Overweight(278.02) 05/29/2007  . RASH-NONVESICULAR 05/29/2007  . Wheezing 06/22/2010    History reviewed. No pertinent surgical history.  Family History  Problem Relation Age of Onset  . Allergies Sister   . Cancer Other        prostate cancer  . Hypertension Other     Social History   Tobacco Use  . Smoking status: Current Every Day Smoker    Types: Cigarettes  . Smokeless tobacco: Never Used  . Tobacco comment: 1 pack weekly  Substance Use Topics  . Alcohol use: Yes    Subjective:  Patient presents with sinus pain/ pressure and cough/ chest congestion x 10 days; feels like congestion is moving into chest; no chest pain  or shortness of breath; does have underlying allergies; using OTC Tylenol Cold, Alka-Seltzer with limited benefit. Not currently on Claritin/ does not like to use Flonase;     Objective:  Vitals:   07/13/18 1034  BP: 132/76  Pulse: 92  Temp: 98.5 F (36.9 C)  TempSrc: Oral  SpO2: 95%  Weight: 180 lb (81.6 kg)  Height: 5\' 3"  (1.6 m)    General: Well developed, well nourished, in no acute distress  Skin : Warm and dry.  Head: Normocephalic and atraumatic  Eyes: Sclera and conjunctiva clear; pupils round and reactive to light;  extraocular movements intact  Ears: External normal; canals clear; tympanic membranes normal  Oropharynx: Pink, supple. No suspicious lesions  Neck: Supple without thyromegaly, adenopathy  Lungs: Respirations unlabored; clear to auscultation bilaterally without wheeze, rales, rhonchi  [facility] exam: normal rate and regular rhythm.  Neurologic: Alert and oriented; speech intact; face symmetrical; moves all extremities well; CNII-XII intact without focal deficit  Assessment:   1. Acute sinusitis, recurrence not specified, unspecified location     Plan:  Rx for Augmentin 875 mg bid x 10 days; patient defers trial of Flonase; continue OTC Tylenol Cold for symptoms; increase fluids, rest and follow-up worse, no better.   No follow-ups on file.  No orders of the defined types were placed in this encounter.   Requested Prescriptions   Signed Prescriptions Disp Refills  . amoxicillin-clavulanate (AUGMENTIN) 875-125 MG tablet 20 tablet 0    Sig: Take 1 tablet by mouth 2 (two) times daily.

## 2018-07-31 ENCOUNTER — Telehealth: Payer: Self-pay | Admitting: Internal Medicine

## 2018-08-01 MED ORDER — ZOLPIDEM TARTRATE 10 MG PO TABS: 10.0000 mg | ORAL_TABLET | Freq: Every evening | ORAL | 5 refills | Status: DC | PRN

## 2018-08-01 NOTE — Telephone Encounter (Signed)
Done erx 

## 2018-08-01 NOTE — Telephone Encounter (Signed)
[facility] is calling to state the medication was denied and they will need to either call in to [phone] Ext [phone]. Or it can be resent.

## 2018-08-01 NOTE — Addendum Note (Signed)
Addended by: Biagio Borg on: 08/01/2018 05:25 PM   Modules accepted: Orders

## 2018-08-16 ENCOUNTER — Encounter: Payer: Self-pay | Admitting: Internal Medicine

## 2018-08-16 ENCOUNTER — Ambulatory Visit (INDEPENDENT_AMBULATORY_CARE_PROVIDER_SITE_OTHER): Payer: 59 | Admitting: Internal Medicine

## 2018-08-16 VITALS — BP 118/72 | HR 115 | Temp 98.8°F | Wt 180.0 lb

## 2018-08-16 DIAGNOSIS — R739 Hyperglycemia, unspecified: Secondary | ICD-10-CM

## 2018-08-16 DIAGNOSIS — F32A Depression, unspecified: Secondary | ICD-10-CM

## 2018-08-16 DIAGNOSIS — F419 Anxiety disorder, unspecified: Secondary | ICD-10-CM

## 2018-08-16 DIAGNOSIS — R232 Flushing: Secondary | ICD-10-CM

## 2018-08-16 DIAGNOSIS — F329 Major depressive disorder, single episode, unspecified: Secondary | ICD-10-CM

## 2018-08-16 MED ORDER — ALPRAZOLAM 0.5 MG PO TABS: 0.5000 mg | ORAL_TABLET | Freq: Two times a day (BID) | ORAL | 2 refills | Status: DC | PRN

## 2018-08-16 MED ORDER — BUPROPION HCL ER (XL) 150 MG PO TB24: 150.0000 mg | ORAL_TABLET | Freq: Every day | ORAL | 3 refills | Status: DC

## 2018-08-16 MED ORDER — ESCITALOPRAM OXALATE 10 MG PO TABS: 10.0000 mg | ORAL_TABLET | Freq: Every day | ORAL | 3 refills | Status: DC

## 2018-08-16 NOTE — Assessment & Plan Note (Signed)
stable overall by history and exam, recent data reviewed with pt, and pt to continue medical treatment as before,  to f/u any worsening symptoms or concerns  

## 2018-08-16 NOTE — Assessment & Plan Note (Signed)
Likely emotional related, hold on specific further eval and tx

## 2018-08-16 NOTE — Patient Instructions (Signed)
Please take all new medication as prescribed - restarting the lexapro 10 mg, and wellbutrin XL 150 mg per day  Please continue all other medications as before, and refills have been done if requested - the xanax and ambien  Please make sure to call to restart the Counseling sessions  Please have the pharmacy call with any other refills you may need.  Please keep your appointments with your specialists as you may have planned  You are given the work note

## 2018-08-16 NOTE — Assessment & Plan Note (Signed)
With situational worsening, ok to cont xanax, but restart the lexapro 10 and wellbutrin xl 150, and restart counseling (pt states will call on her own for this); also gave work note; to go to ED for panic attack or SI or HI

## 2018-08-16 NOTE — Progress Notes (Signed)
Subjective:    Patient ID: Michelle Solomon, female    DOB: December 27, 1960, 58 y.o.   MRN: [id number]  HPI  Here to f/u, tearful today, has increased anxiety and hot flashes related to husbands passing almost 1 yr ago and bday month also this month;  Was seeing therapist several times in the past yr but stopped in October, also stopped the lexapro and wellbutrin as thought she was doing better;  Now with mild worsening depressive symptoms, but no suicidal ideation, but maybe having occasional panic attack as superviser has noticed her behavior.[name] Kitchen  Has had increased headaches as well.  Asking for medical leave Past Medical History:  Diagnosis Date  . Acute bronchitis 06/06/2010  . ALLERGIC RHINITIS 05/29/2007  . Anal or rectal pain 06/23/2008  . ANEMIA-IRON DEFICIENCY 05/29/2007  . ASTHMA 05/29/2007  . 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 CONJUNCTIVITIS, ALLERGIC 11/27/2008  . HEMORRHOIDS 06/16/2008  . NIPPLE DISCHARGE 01/21/2010  . Other specified forms of hearing loss 10/08/2009  . Overweight(278.02) 05/29/2007  . RASH-NONVESICULAR 05/29/2007  . Wheezing 06/22/2010   No past surgical history on file.  reports that she has been smoking cigarettes. She has never used smokeless tobacco. She reports current alcohol use. She reports that she does not use drugs. family history includes Allergies in her sister; Cancer in an other family member; Hypertension in an other family member. No Known Allergies Current Outpatient Medications on File Prior to Visit  Medication Sig Dispense Refill  . hydrochlorothiazide (HYDRODIURIL) 12.5 MG tablet Take 1 tablet (12.5 mg total) by mouth daily. 30 tablet 5  . loratadine (CLARITIN) 10 MG tablet Take 10 mg by mouth daily as needed for allergies.    . meloxicam (MOBIC) 15 MG tablet Take 0.5-1 tablets (7.5-15 mg total) by mouth daily. 30 tablet 0  . zolpidem (AMBIEN) 10 MG tablet Take 1 tablet (10 mg total) by mouth at bedtime as needed for up to 30  days for sleep. 30 tablet 5  . ALPRAZolam (XANAX) 0.5 MG tablet Take 1 tablet (0.5 mg total) by mouth at bedtime as needed for anxiety or sleep. (Patient not taking: Reported on 08/16/2018) 30 tablet 2  . buPROPion (WELLBUTRIN XL) 150 MG 24 hr tablet Take 1 tablet (150 mg total) by mouth daily. (Patient not taking: Reported on 08/16/2018) 90 tablet 3  . cyclobenzaprine (FLEXERIL) 5 MG tablet Take 1 tablet (5 mg total) by mouth 3 (three) times daily as needed. (Patient not taking: Reported on 08/16/2018) 90 tablet 1  . escitalopram (LEXAPRO) 20 MG tablet Take 1 tablet (20 mg total) by mouth daily. (Patient not taking: Reported on 08/16/2018) 90 tablet 3  . gabapentin (NEURONTIN) 100 MG capsule Take 1 capsule (100 mg total) by mouth 3 (three) times daily. (Patient not taking: Reported on 08/16/2018) 90 capsule 3  . hydrocortisone 2.5 % cream Apply topically 2 (two) times daily. (Patient not taking: Reported on 08/16/2018) 30 g 1   No current facility-administered medications on file prior to visit.    Review of Systems  Constitutional: Negative for other unusual diaphoresis or sweats HENT: Negative for ear discharge or swelling Eyes: Negative for other worsening visual disturbances Respiratory: Negative for stridor or other swelling  Gastrointestinal: Negative for worsening distension or other blood Genitourinary: Negative for retention or other urinary change Musculoskeletal: Negative for other MSK pain or swelling Skin: Negative for color change or other new lesions Neurological: Negative for worsening tremors and other numbness  Psychiatric/Behavioral: Negative for  worsening agitation or other fatigue All other system neg per pt    Objective:   Physical Exam BP 118/72 (BP Location: Left Arm, Patient Position: Sitting, Cuff Size: Normal)   Pulse (!) 115   Temp 98.8 F (37.1 C) (Oral)   Wt 180 lb (81.6 kg)   SpO2 97%   BMI 31.89 kg/m  VS noted,  Constitutional: Pt appears in NAD HENT:  Head: NCAT.  Right Ear: External ear normal.  Left Ear: External ear normal.  Eyes: . Pupils are equal, round, and reactive to light. Conjunctivae and EOM are normal Nose: without d/c or deformity Neck: Neck supple. Gross normal ROM Cardiovascular: Normal rate and regular rhythm.   Pulmonary/Chest: Effort normal and breath sounds without rales or wheezing.  Abd:  Soft, NT, ND, + BS, no organomegaly Neurological: Pt is alert. At baseline orientation, motor grossly intact Skin: Skin is warm. No rashes, other new lesions, no LE edema Psychiatric: Pt behavior is normal without agitation , 1-2+ nervous and tearful Lab Results  Component Value Date   WBC 8.1 12/25/2017   HGB 13.7 12/25/2017   HCT 42.0 12/25/2017   PLT 256 12/25/2017   GLUCOSE 89 12/25/2017   CHOL 155 11/09/2017   TRIG 60.0 11/09/2017   HDL 59.20 11/09/2017   LDLCALC 83 11/09/2017   ALT 11 11/09/2017   AST 14 11/09/2017   NA 142 12/25/2017   K 3.7 12/25/2017   CL 103 12/25/2017   CREATININE 0.73 12/25/2017   BUN 7 12/25/2017   CO2 29 12/25/2017   TSH 0.83 11/09/2017   HGBA1C 5.6 11/09/2017        Assessment & Plan:

## 2018-08-20 ENCOUNTER — Telehealth: Payer: Self-pay | Admitting: Emergency Medicine

## 2018-08-20 ENCOUNTER — Ambulatory Visit (INDEPENDENT_AMBULATORY_CARE_PROVIDER_SITE_OTHER): Payer: 59 | Admitting: Psychology

## 2018-08-20 DIAGNOSIS — F321 Major depressive disorder, single episode, moderate: Secondary | ICD-10-CM | POA: Diagnosis not present

## 2018-08-20 NOTE — Telephone Encounter (Signed)
Received paperwork for leave of absence. Given to Shirron for completion as I was not able to complete questions on patient's appearance.

## 2018-08-28 ENCOUNTER — Ambulatory Visit (INDEPENDENT_AMBULATORY_CARE_PROVIDER_SITE_OTHER): Payer: 59 | Admitting: Internal Medicine

## 2018-08-28 ENCOUNTER — Encounter: Payer: Self-pay | Admitting: Internal Medicine

## 2018-08-28 VITALS — BP 142/90 | HR 74 | Temp 98.2°F | Ht 63.0 in | Wt 177.0 lb

## 2018-08-28 DIAGNOSIS — F329 Major depressive disorder, single episode, unspecified: Secondary | ICD-10-CM | POA: Diagnosis not present

## 2018-08-28 DIAGNOSIS — J4520 Mild intermittent asthma, uncomplicated: Secondary | ICD-10-CM

## 2018-08-28 DIAGNOSIS — R739 Hyperglycemia, unspecified: Secondary | ICD-10-CM | POA: Diagnosis not present

## 2018-08-28 DIAGNOSIS — F419 Anxiety disorder, unspecified: Secondary | ICD-10-CM | POA: Diagnosis not present

## 2018-08-28 DIAGNOSIS — F32A Depression, unspecified: Secondary | ICD-10-CM

## 2018-08-28 NOTE — Patient Instructions (Signed)
Please continue all other medications as before, and refills have been done if requested.  Please have the pharmacy call with any other refills you may need.  Please continue your efforts at being more active, low cholesterol diet, and weight control.  You are otherwise up to date with prevention measures today.  Please keep your appointments with your specialists as you may have planned - therapy  You are given the letter to extend the out of work time to Sep 24, 2018  We will fill out the [employer] as well

## 2018-08-28 NOTE — Assessment & Plan Note (Signed)
Improved but unable to return to work as planned, will need note and disability forms for time off work - Aug 16, 2018 to return to work date of Aug 26, 2018

## 2018-08-28 NOTE — Assessment & Plan Note (Signed)
stable overall by history and exam, recent data reviewed with pt, and pt to continue medical treatment as before,  to f/u any worsening symptoms or concerns  

## 2018-08-28 NOTE — Progress Notes (Signed)
Subjective:    Patient ID: Michelle Solomon, female    DOB: October 11, 1960, 58 y.o.   MRN: [id number]  HPI   Here to f/u, states has seen some improvement overall since last visit here on current meds, but remains at least moderately depressed, denies SI or HI, has seen counseling once, and has 2 more scheduled appt with next one mar 19; having worsening HA and difficulty sleeping since just back from [place] to bury tge husband  No panic attacks. Pt denies chest pain, increased sob or doe, wheezing, orthopnea, PND, increased LE swelling, palpitations, dizziness or syncope.  Pt denies polydipsia, polyuria Past Medical History:  Diagnosis Date  . Acute bronchitis 06/06/2010  . ALLERGIC RHINITIS 05/29/2007  . Anal or rectal pain 06/23/2008  . ANEMIA-IRON DEFICIENCY 05/29/2007  . ASTHMA 05/29/2007  . 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 CONJUNCTIVITIS, ALLERGIC 11/27/2008  . HEMORRHOIDS 06/16/2008  . NIPPLE DISCHARGE 01/21/2010  . Other specified forms of hearing loss 10/08/2009  . Overweight(278.02) 05/29/2007  . RASH-NONVESICULAR 05/29/2007  . Wheezing 06/22/2010   No past surgical history on file.  reports that she has been smoking cigarettes. She has never used smokeless tobacco. She reports current alcohol use. She reports that she does not use drugs. family history includes Allergies in her sister; Cancer in an other family member; Hypertension in an other family member. No Known Allergies Current Outpatient Medications on File Prior to Visit  Medication Sig Dispense Refill  . ALPRAZolam (XANAX) 0.5 MG tablet Take 1 tablet (0.5 mg total) by mouth 2 (two) times daily as needed for anxiety or sleep. 60 tablet 2  . buPROPion (WELLBUTRIN XL) 150 MG 24 hr tablet Take 1 tablet (150 mg total) by mouth daily. 90 tablet 3  . cyclobenzaprine (FLEXERIL) 5 MG tablet Take 1 tablet (5 mg total) by mouth 3 (three) times daily as needed. 90 tablet 1  . escitalopram (LEXAPRO) 10 MG tablet Take  1 tablet (10 mg total) by mouth daily. 90 tablet 3  . gabapentin (NEURONTIN) 100 MG capsule Take 1 capsule (100 mg total) by mouth 3 (three) times daily. 90 capsule 3  . hydrochlorothiazide (HYDRODIURIL) 12.5 MG tablet Take 1 tablet (12.5 mg total) by mouth daily. 30 tablet 5  . hydrocortisone 2.5 % cream Apply topically 2 (two) times daily. 30 g 1  . loratadine (CLARITIN) 10 MG tablet Take 10 mg by mouth daily as needed for allergies.    . meloxicam (MOBIC) 15 MG tablet Take 0.5-1 tablets (7.5-15 mg total) by mouth daily. 30 tablet 0  . zolpidem (AMBIEN) 10 MG tablet Take 1 tablet (10 mg total) by mouth at bedtime as needed for up to 30 days for sleep. 30 tablet 5   No current facility-administered medications on file prior to visit.    Review of Systems  Constitutional: Negative for other unusual diaphoresis or sweats HENT: Negative for ear discharge or swelling Eyes: Negative for other worsening visual disturbances Respiratory: Negative for stridor or other swelling  Gastrointestinal: Negative for worsening distension or other blood Genitourinary: Negative for retention or other urinary change Musculoskeletal: Negative for other MSK pain or swelling Skin: Negative for color change or other new lesions Neurological: Negative for worsening tremors and other numbness  Psychiatric/Behavioral: Negative for worsening agitation or other fatigue ALl other system neg per pt    Objective:   Physical Exam BP (!) 142/90   Pulse 74   Temp 98.2 F (36.8 C) (Oral)  Ht 5\' 3"  (1.6 m)   Wt 177 lb (80.3 kg)   SpO2 97%   BMI 31.35 kg/m  VS noted,  Constitutional: Pt appears in NAD HENT: Head: NCAT.  Right Ear: External ear normal.  Left Ear: External ear normal.  Eyes: . Pupils are equal, round, and reactive to light. Conjunctivae and EOM are normal Nose: without d/c or deformity Neck: Neck supple. Gross normal ROM Cardiovascular: Normal rate and regular rhythm.   Pulmonary/Chest: Effort  normal and breath sounds without rales or wheezing.  Abd:  Soft, NT, ND, + BS, no organomegaly Neurological: Pt is alert. At baseline orientation, motor grossly intact Skin: Skin is warm. No rashes, other new lesions, no LE edema Psychiatric: Pt behavior is normal without agitation but depressed affect, mild psychomotor retardation No other exam findings Lab Results  Component Value Date   WBC 8.1 12/25/2017   HGB 13.7 12/25/2017   HCT 42.0 12/25/2017   PLT 256 12/25/2017   GLUCOSE 89 12/25/2017   CHOL 155 11/09/2017   TRIG 60.0 11/09/2017   HDL 59.20 11/09/2017   LDLCALC 83 11/09/2017   ALT 11 11/09/2017   AST 14 11/09/2017   NA 142 12/25/2017   K 3.7 12/25/2017   CL 103 12/25/2017   CREATININE 0.73 12/25/2017   BUN 7 12/25/2017   CO2 29 12/25/2017   TSH 0.83 11/09/2017   HGBA1C 5.6 11/09/2017        Assessment & Plan:

## 2018-09-06 ENCOUNTER — Ambulatory Visit (INDEPENDENT_AMBULATORY_CARE_PROVIDER_SITE_OTHER): Payer: Self-pay | Admitting: Psychology

## 2018-09-06 DIAGNOSIS — F438 Other reactions to severe stress: Secondary | ICD-10-CM

## 2018-09-17 ENCOUNTER — Other Ambulatory Visit: Payer: Self-pay | Admitting: Internal Medicine

## 2018-09-20 ENCOUNTER — Ambulatory Visit (INDEPENDENT_AMBULATORY_CARE_PROVIDER_SITE_OTHER): Payer: Self-pay | Admitting: Psychology

## 2018-09-20 DIAGNOSIS — F438 Other reactions to severe stress: Secondary | ICD-10-CM

## 2018-09-21 ENCOUNTER — Encounter: Payer: Self-pay | Admitting: Internal Medicine

## 2018-09-21 ENCOUNTER — Other Ambulatory Visit: Payer: Self-pay

## 2018-09-21 ENCOUNTER — Ambulatory Visit (INDEPENDENT_AMBULATORY_CARE_PROVIDER_SITE_OTHER): Payer: 59 | Admitting: Internal Medicine

## 2018-09-21 VITALS — BP 120/84 | HR 94 | Temp 98.4°F | Ht 63.0 in | Wt 181.0 lb

## 2018-09-21 DIAGNOSIS — F329 Major depressive disorder, single episode, unspecified: Secondary | ICD-10-CM | POA: Diagnosis not present

## 2018-09-21 DIAGNOSIS — R739 Hyperglycemia, unspecified: Secondary | ICD-10-CM

## 2018-09-21 DIAGNOSIS — F419 Anxiety disorder, unspecified: Secondary | ICD-10-CM | POA: Diagnosis not present

## 2018-09-21 DIAGNOSIS — J4520 Mild intermittent asthma, uncomplicated: Secondary | ICD-10-CM

## 2018-09-21 DIAGNOSIS — F32A Depression, unspecified: Secondary | ICD-10-CM

## 2018-09-21 MED ORDER — BUPROPION HCL ER (XL) 300 MG PO TB24
300.0000 mg | ORAL_TABLET | Freq: Every day | ORAL | 3 refills | Status: DC
Start: 2018-09-21 — End: 2019-03-20

## 2018-09-21 NOTE — Assessment & Plan Note (Signed)
Mild uncontrolled, for increased wellbutrin xl to 300 qd, cont counseling, and agree back to work mar 30 should be ok

## 2018-09-21 NOTE — Assessment & Plan Note (Signed)
stable overall by history and exam, recent data reviewed with pt, and pt to continue medical treatment as before,  to f/u any worsening symptoms or concerns  

## 2018-09-21 NOTE — Patient Instructions (Signed)
Ok to increase the wellbutrin XL to 300 mg per day  Please continue all other medications as before, and refills have been done if requested.  Please have the pharmacy call with any other refills you may need.  Please continue your efforts at being more active, low cholesterol diet, and weight control.  Please keep your appointments with your specialists as you may have planned

## 2018-09-21 NOTE — Progress Notes (Signed)
Subjective:    Patient ID: Michelle Solomon, female    DOB: [birth year], 58 y.o.   MRN: [id number]  HPI  .Here to f/u; overall doing ok,  Pt denies chest pain, increasing sob or doe, wheezing, orthopnea, PND, increased LE swelling, palpitations, dizziness or syncope.  Pt denies new neurological symptoms such as new headache, or facial or extremity weakness or numbness.  Pt denies polydipsia, polyuria, or low sugar episode.  Pt states overall good compliance with meds, mostly trying to follow appropriate diet, with wt overall stable,  but little exercise however. Denies worsening depressive symptoms, suicidal ideation, or panic; has ongoing anxiety and stress., has been seen per counseling yesterday with recommendation for increased wellbutrin, and out of work extension to mar 73.  No SI or HI Past Medical History:  Diagnosis Date  . Acute bronchitis 06/06/2010  . ALLERGIC RHINITIS 05/29/2007  . Anal or rectal pain 06/23/2008  . ANEMIA-IRON DEFICIENCY 05/29/2007  . ASTHMA 05/29/2007  . 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 CONJUNCTIVITIS, ALLERGIC 11/27/2008  . HEMORRHOIDS 06/16/2008  . NIPPLE DISCHARGE 01/21/2010  . Other specified forms of hearing loss 10/08/2009  . Overweight(278.02) 05/29/2007  . RASH-NONVESICULAR 05/29/2007  . Wheezing 06/22/2010   No past surgical history on file.  reports that she has been smoking cigarettes. She has never used smokeless tobacco. She reports current alcohol use. She reports that she does not use drugs. family history includes Allergies in her sister; Cancer in an other family member; Hypertension in an other family member. No Known Allergies Current Outpatient Medications on File Prior to Visit  Medication Sig Dispense Refill  . ALPRAZolam (XANAX) 0.5 MG tablet Take 1 tablet (0.5 mg total) by mouth 2 (two) times daily as needed for anxiety or sleep. 60 tablet 2  . cyclobenzaprine (FLEXERIL) 5 MG tablet Take 1 tablet (5 mg total) by mouth 3  (three) times daily as needed. 90 tablet 1  . escitalopram (LEXAPRO) 10 MG tablet Take 1 tablet (10 mg total) by mouth daily. 90 tablet 3  . gabapentin (NEURONTIN) 100 MG capsule Take 1 capsule (100 mg total) by mouth 3 (three) times daily. 90 capsule 3  . hydrochlorothiazide (HYDRODIURIL) 12.5 MG tablet TAKE 1 TABLET BY MOUTH EVERY DAY 30 tablet 0  . hydrocortisone 2.5 % cream Apply topically 2 (two) times daily. 30 g 1  . loratadine (CLARITIN) 10 MG tablet Take 10 mg by mouth daily as needed for allergies.    . meloxicam (MOBIC) 15 MG tablet Take 0.5-1 tablets (7.5-15 mg total) by mouth daily. 30 tablet 0  . zolpidem (AMBIEN) 10 MG tablet Take 1 tablet (10 mg total) by mouth at bedtime as needed for up to 30 days for sleep. 30 tablet 5   No current facility-administered medications on file prior to visit.     Review of Systems  Constitutional: Negative for other unusual diaphoresis or sweats HENT: Negative for ear discharge or swelling Eyes: Negative for other worsening visual disturbances Respiratory: Negative for stridor or other swelling  Gastrointestinal: Negative for worsening distension or other blood Genitourinary: Negative for retention or other urinary change Musculoskeletal: Negative for other MSK pain or swelling Skin: Negative for color change or other new lesions Neurological: Negative for worsening tremors and other numbness  Psychiatric/Behavioral: Negative for worsening agitation or other fatigue All other system neg per pt    Objective:   Physical Exam BP 120/84   Pulse 94   Temp 98.4 F (36.9 C) (  Oral)   Ht 5\' 3"  (1.6 m)   Wt 181 lb (82.1 kg)   SpO2 97%   BMI 32.06 kg/m  VS noted,  Constitutional: Pt appears in NAD HENT: Head: NCAT.  Right Ear: External ear normal.  Left Ear: External ear normal.  Eyes: . Pupils are equal, round, and reactive to light. Conjunctivae and EOM are normal Nose: without d/c or deformity Neck: Neck supple. Gross normal ROM  Cardiovascular: Normal rate and regular rhythm.   Pulmonary/Chest: Effort normal and breath sounds without rales or wheezing.  Neurological: Pt is alert. At baseline orientation, motor grossly intact Skin: Skin is warm. No rashes, other new lesions, no LE edema Psychiatric: Pt behavior is normal without agitation , + depressed affect No other exam findings Lab Results  Component Value Date   WBC 8.1 12/25/2017   HGB 13.7 12/25/2017   HCT 42.0 12/25/2017   PLT 256 12/25/2017   GLUCOSE 89 12/25/2017   CHOL 155 11/09/2017   TRIG 60.0 11/09/2017   HDL 59.20 11/09/2017   LDLCALC 83 11/09/2017   ALT 11 11/09/2017   AST 14 11/09/2017   NA 142 12/25/2017   K 3.7 12/25/2017   CL 103 12/25/2017   CREATININE 0.73 12/25/2017   BUN 7 12/25/2017   CO2 29 12/25/2017   TSH 0.83 11/09/2017   HGBA1C 5.6 11/09/2017       Assessment & Plan:

## 2018-09-26 ENCOUNTER — Ambulatory Visit (INDEPENDENT_AMBULATORY_CARE_PROVIDER_SITE_OTHER): Payer: 59 | Admitting: Psychology

## 2018-09-26 DIAGNOSIS — F438 Other reactions to severe stress: Secondary | ICD-10-CM

## 2018-10-01 ENCOUNTER — Ambulatory Visit: Payer: 59 | Admitting: Psychology

## 2018-10-01 ENCOUNTER — Ambulatory Visit (INDEPENDENT_AMBULATORY_CARE_PROVIDER_SITE_OTHER): Payer: 59 | Admitting: Psychology

## 2018-10-01 DIAGNOSIS — F438 Other reactions to severe stress: Secondary | ICD-10-CM

## 2018-10-04 ENCOUNTER — Ambulatory Visit (INDEPENDENT_AMBULATORY_CARE_PROVIDER_SITE_OTHER): Payer: 59 | Admitting: Psychology

## 2018-10-04 DIAGNOSIS — F438 Other reactions to severe stress: Secondary | ICD-10-CM

## 2018-10-08 ENCOUNTER — Ambulatory Visit: Payer: 59 | Admitting: Psychology

## 2018-10-18 ENCOUNTER — Ambulatory Visit (INDEPENDENT_AMBULATORY_CARE_PROVIDER_SITE_OTHER): Payer: 59 | Admitting: Psychology

## 2018-10-18 DIAGNOSIS — F438 Other reactions to severe stress: Secondary | ICD-10-CM | POA: Diagnosis not present

## 2018-10-25 ENCOUNTER — Ambulatory Visit (INDEPENDENT_AMBULATORY_CARE_PROVIDER_SITE_OTHER): Payer: 59 | Admitting: Psychology

## 2018-10-25 DIAGNOSIS — F438 Other reactions to severe stress: Secondary | ICD-10-CM

## 2018-11-01 ENCOUNTER — Ambulatory Visit (INDEPENDENT_AMBULATORY_CARE_PROVIDER_SITE_OTHER): Payer: 59 | Admitting: Psychology

## 2018-11-01 DIAGNOSIS — F438 Other reactions to severe stress: Secondary | ICD-10-CM

## 2018-11-08 ENCOUNTER — Ambulatory Visit: Payer: 59 | Admitting: Psychology

## 2018-11-13 ENCOUNTER — Ambulatory Visit: Payer: Self-pay | Admitting: Psychology

## 2018-11-15 ENCOUNTER — Ambulatory Visit (INDEPENDENT_AMBULATORY_CARE_PROVIDER_SITE_OTHER): Payer: 59 | Admitting: Psychology

## 2018-11-15 DIAGNOSIS — F438 Other reactions to severe stress: Secondary | ICD-10-CM

## 2018-11-16 ENCOUNTER — Telehealth: Payer: Self-pay | Admitting: Internal Medicine

## 2018-11-16 MED ORDER — HYDROCHLOROTHIAZIDE 12.5 MG PO TABS: 12.5000 mg | ORAL_TABLET | Freq: Every day | ORAL | 2 refills | Status: DC

## 2018-11-16 NOTE — Telephone Encounter (Signed)
Copied from [place] [id number]. Topic: Quick Communication - Rx Refill/Question >> Nov 16, 2018  2:20 PM Pauline Good wrote: Medication: hydrochlorothiazide 12.5mg   Has the patient contacted their pharmacy? yes (Agent: If no, request that the patient contact the pharmacy for the refill.) (Agent: If yes, when and what did the pharmacy advise?) ins will only pay for a 90day refill. Need new prescription  Preferred Pharmacy (with phone number or street name): [facility]  Pearline Cables: Please be advised that RX refills may take up to 3 business days. We ask that you follow-up with your pharmacy.

## 2018-11-21 ENCOUNTER — Ambulatory Visit (INDEPENDENT_AMBULATORY_CARE_PROVIDER_SITE_OTHER): Payer: 59 | Admitting: Psychology

## 2018-11-21 DIAGNOSIS — F438 Other reactions to severe stress: Secondary | ICD-10-CM | POA: Diagnosis not present

## 2018-11-28 ENCOUNTER — Ambulatory Visit (INDEPENDENT_AMBULATORY_CARE_PROVIDER_SITE_OTHER): Payer: 59 | Admitting: Psychology

## 2018-11-28 DIAGNOSIS — F438 Other reactions to severe stress: Secondary | ICD-10-CM | POA: Diagnosis not present

## 2018-12-05 ENCOUNTER — Ambulatory Visit (INDEPENDENT_AMBULATORY_CARE_PROVIDER_SITE_OTHER): Payer: 59 | Admitting: Psychology

## 2018-12-05 DIAGNOSIS — F438 Other reactions to severe stress: Secondary | ICD-10-CM

## 2018-12-19 ENCOUNTER — Ambulatory Visit: Payer: 59 | Admitting: Psychology

## 2019-02-17 ENCOUNTER — Other Ambulatory Visit: Payer: Self-pay | Admitting: Internal Medicine

## 2019-03-20 ENCOUNTER — Other Ambulatory Visit: Payer: Self-pay | Admitting: Internal Medicine

## 2019-03-20 MED ORDER — BUPROPION HCL ER (XL) 300 MG PO TB24: 300.0000 mg | ORAL_TABLET | Freq: Every day | ORAL | 0 refills | Status: DC

## 2019-03-20 NOTE — Telephone Encounter (Signed)
Medication Refill - Medication: buPROPion (WELLBUTRIN XL) 300 MG 24 hr tablet    Has the patient contacted their pharmacy? No. (Agent: If no, request that the patient contact the pharmacy for the refill.) (Agent: If yes, when and what did the pharmacy advise?)  Preferred Pharmacy (with phone number or street name):  [facility] [place], [address] AT [address] [phone] (Phone) [phone] (Fax)     Agent: Please be advised that RX refills may take up to 3 business days. We ask that you follow-up with your pharmacy.

## 2019-05-27 ENCOUNTER — Other Ambulatory Visit: Payer: Self-pay | Admitting: Internal Medicine

## 2019-05-27 MED ORDER — HYDROCHLOROTHIAZIDE 12.5 MG PO TABS: 12.5000 mg | ORAL_TABLET | Freq: Every day | ORAL | 0 refills | Status: DC

## 2019-08-16 ENCOUNTER — Telehealth: Payer: Self-pay | Admitting: Internal Medicine

## 2019-08-16 DIAGNOSIS — Z0000 Encounter for general adult medical examination without abnormal findings: Secondary | ICD-10-CM

## 2019-08-16 DIAGNOSIS — E538 Deficiency of other specified B group vitamins: Secondary | ICD-10-CM

## 2019-08-16 DIAGNOSIS — E559 Vitamin D deficiency, unspecified: Secondary | ICD-10-CM

## 2019-08-16 DIAGNOSIS — R739 Hyperglycemia, unspecified: Secondary | ICD-10-CM

## 2019-08-16 DIAGNOSIS — E611 Iron deficiency: Secondary | ICD-10-CM

## 2019-08-16 NOTE — Telephone Encounter (Signed)
Please advise about what labs you would like ordered

## 2019-08-16 NOTE — Telephone Encounter (Signed)
   Patient requesting order  for annual labs 

## 2019-08-16 NOTE — Telephone Encounter (Signed)
Pt notified, lab appt made

## 2019-08-16 NOTE — Telephone Encounter (Signed)
Labs ordered  [facility] to make appt for pt - first floor lab

## 2019-08-26 ENCOUNTER — Other Ambulatory Visit: Payer: Self-pay

## 2019-08-26 ENCOUNTER — Emergency Department (HOSPITAL_COMMUNITY): Payer: 59

## 2019-08-26 ENCOUNTER — Encounter (HOSPITAL_COMMUNITY): Payer: Self-pay | Admitting: Emergency Medicine

## 2019-08-26 ENCOUNTER — Emergency Department (HOSPITAL_COMMUNITY)
Admission: EM | Admit: 2019-08-26 | Discharge: 2019-08-26 | Disposition: A | Discharge status: Home / Self Care | Payer: 59 | Attending: Emergency Medicine | Admitting: Emergency Medicine

## 2019-08-26 DIAGNOSIS — J45909 Unspecified asthma, uncomplicated: Secondary | ICD-10-CM | POA: Diagnosis not present

## 2019-08-26 DIAGNOSIS — R1031 Right lower quadrant pain: Secondary | ICD-10-CM | POA: Diagnosis present

## 2019-08-26 DIAGNOSIS — R1011 Right upper quadrant pain: Secondary | ICD-10-CM | POA: Insufficient documentation

## 2019-08-26 DIAGNOSIS — F17210 Nicotine dependence, cigarettes, uncomplicated: Secondary | ICD-10-CM | POA: Diagnosis not present

## 2019-08-26 DIAGNOSIS — Z79899 Other long term (current) drug therapy: Secondary | ICD-10-CM | POA: Insufficient documentation

## 2019-08-26 LAB — URINALYSIS, ROUTINE W REFLEX MICROSCOPIC
Bilirubin Urine: NEGATIVE
Glucose, UA: NEGATIVE mg/dL
Hgb urine dipstick: NEGATIVE
Ketones, ur: NEGATIVE mg/dL
Leukocytes,Ua: NEGATIVE
Nitrite: NEGATIVE
Protein, ur: NEGATIVE mg/dL
Specific Gravity, Urine: 1.019 (ref 1.005–1.030)
pH: 7 (ref 5.0–8.0)

## 2019-08-26 LAB — COMPREHENSIVE METABOLIC PANEL
ALT: 25 U/L (ref 0–44)
AST: 25 U/L (ref 15–41)
Albumin: 3.8 g/dL (ref 3.5–5.0)
Alkaline Phosphatase: 89 U/L (ref 38–126)
Anion gap: 9 (ref 5–15)
BUN: 9 mg/dL (ref 6–20)
CO2: 30 mmol/L (ref 22–32)
Calcium: 10.3 mg/dL (ref 8.9–10.3)
Chloride: 98 mmol/L (ref 98–111)
Creatinine, Ser: 0.84 mg/dL (ref 0.44–1.00)
GFR calc Af Amer: >60 mL/min (ref >60)
GFR calc non Af Amer: >60 mL/min (ref >60)
Glucose, Bld: 95 mg/dL (ref 70–99)
Potassium: 4 mmol/L (ref 3.5–5.1)
Sodium: 137 mmol/L (ref 135–145)
Total Bilirubin: 0.4 mg/dL (ref 0.3–1.2)
Total Protein: 7.2 g/dL (ref 6.5–8.1)

## 2019-08-26 LAB — CBC
HCT: 42.9 % (ref 36.0–46.0)
Hemoglobin: 13.7 g/dL (ref 12.0–15.0)
MCH: 26.8 pg (ref 26.0–34.0)
MCHC: 31.9 g/dL (ref 30.0–36.0)
MCV: 83.8 fL (ref 80.0–100.0)
Platelets: 291 10*3/uL (ref 150–400)
RBC: 5.12 MIL/uL — ABNORMAL HIGH (ref 3.87–5.11)
RDW: 15 % (ref 11.5–15.5)
WBC: 8.3 10*3/uL (ref 4.0–10.5)
nRBC: 0 % (ref 0.0–0.2)

## 2019-08-26 LAB — LIPASE, BLOOD: Lipase: 20 U/L (ref 11–51)

## 2019-08-26 LAB — I-STAT BETA HCG BLOOD, ED (MC, WL, AP ONLY): I-stat hCG, quantitative: <5 m[IU]/mL (ref <5)

## 2019-08-26 MED ORDER — KETOROLAC TROMETHAMINE 30 MG/ML IJ SOLN
30.0000 mg | Freq: Once | INTRAMUSCULAR | Status: AC
  Administered 2019-08-26: 30 mg via INTRAVENOUS
  Filled 2019-08-26: qty 1

## 2019-08-26 MED ORDER — OXYCODONE-ACETAMINOPHEN 5-325 MG PO TABS: 1.0000 | ORAL_TABLET | Freq: Four times a day (QID) | ORAL | 0 refills | Status: DC | PRN

## 2019-08-26 MED ORDER — IOHEXOL 300 MG/ML  SOLN
100.0000 mL | Freq: Once | INTRAMUSCULAR | Status: AC | PRN
  Administered 2019-08-26: 100 mL via INTRAVENOUS

## 2019-08-26 MED ORDER — FENTANYL CITRATE (PF) 100 MCG/2ML IJ SOLN
50.0000 ug | Freq: Once | INTRAMUSCULAR | Status: AC
  Administered 2019-08-26: 50 ug via INTRAVENOUS
  Filled 2019-08-26: qty 2

## 2019-08-26 MED ORDER — SODIUM CHLORIDE 0.9% FLUSH: 3.0000 mL | Freq: Once | INTRAVENOUS | Status: DC

## 2019-08-26 NOTE — ED Provider Notes (Signed)
[facility] EMERGENCY DEPARTMENT Provider Note   CSN: [id number] Arrival date & time: 08/26/19  1616     History Chief Complaint  Patient presents with  . Abdominal Pain  . Flank Pain    Michelle Solomon is a 59 y.o. female.  She is complaining of right upper quadrant pain aching in nature 8 out of 10 intensity radiates through to the back worse with deep breath that started this morning.  No shortness of breath no fevers chills cough nausea vomiting diarrhea.  Tried Tums without improvement.  Never had this symptom before.  No urinary symptoms no vaginal bleeding or discharge.  No prior surgical history.  The history is provided by the patient.  Abdominal Pain Pain location:  RUQ Pain quality: aching   Pain radiates to:  R flank Pain severity:  Severe Onset quality:  Sudden Duration:  12 hours Timing:  Constant Progression:  Unchanged Chronicity:  New Context: not recent travel, not sick contacts and not trauma   Relieved by:  Nothing Worsened by:  Movement Ineffective treatments:  Antacids Associated symptoms: no chest pain, no cough, no diarrhea, no dysuria, no fever, no hematemesis, no hematochezia, no hematuria, no nausea, no shortness of breath, no sore throat, no vaginal bleeding, no vaginal discharge and no vomiting        Past Medical History:  Diagnosis Date  . Acute bronchitis 06/06/2010  . ALLERGIC RHINITIS 05/29/2007  . Anal or rectal pain 06/23/2008  . ANEMIA-IRON DEFICIENCY 05/29/2007  . ASTHMA 05/29/2007  . 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 CONJUNCTIVITIS, ALLERGIC 11/27/2008  . HEMORRHOIDS 06/16/2008  . NIPPLE DISCHARGE 01/21/2010  . Other specified forms of hearing loss 10/08/2009  . Overweight(278.02) 05/29/2007  . RASH-NONVESICULAR 05/29/2007  . Wheezing 06/22/2010    Patient Active Problem List   Diagnosis Date Noted  . Insomnia 04/18/2018  . Hot flashes 12/28/2017  . Hyperglycemia 11/09/2017  . Grief reaction  09/20/2017  . Left sided sciatica 03/14/2017  . Acne 06/10/2016  . Frozen shoulder syndrome 05/12/2015  . Right shoulder pain 04/30/2015  . Breast pain, left 12/16/2014  . Smoker 12/16/2014  . Anxiety and depression 12/16/2014  . Hearing loss 04/05/2012  . Preventative health care 12/24/2010  . Back pain 12/24/2010  . CONJUNCTIVITIS, ALLERGIC 11/27/2008  . Anal or rectal pain 06/23/2008  . HEMORRHOIDS 06/16/2008  . 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 ANEMIA-IRON DEFICIENCY 05/29/2007  . ALLERGIC RHINITIS 05/29/2007  . Asthma 05/29/2007    History reviewed. No pertinent surgical history.   OB History   No obstetric history on file.     Family History  Problem Relation Age of Onset  . Allergies Sister   . Cancer Other        prostate cancer  . Hypertension Other     Social History   Tobacco Use  . Smoking status: Current Every Day Smoker    Types: Cigarettes  . Smokeless tobacco: Never Used  . Tobacco comment: 1 pack weekly  Substance Use Topics  . Alcohol use: Yes  . Drug use: No    Home Medications Prior to Admission medications   Medication Sig Start Date End Date Taking? Authorizing Provider  ALPRAZolam Duanne Moron) 0.5 MG tablet Take 1 tablet (0.5 mg total) by mouth 2 (two) times daily as needed for anxiety or sleep. 08/16/18   Biagio Borg, MD  buPROPion (WELLBUTRIN XL) 300 MG 24 hr tablet Take 1 tablet (300 mg total)  by mouth daily. 03/20/19   Biagio Borg, MD  cyclobenzaprine (FLEXERIL) 5 MG tablet Take 1 tablet (5 mg total) by mouth 3 (three) times daily as needed. 09/06/17   Jaynee Eagles, PA-C  escitalopram (LEXAPRO) 10 MG tablet Take 1 tablet (10 mg total) by mouth daily. 08/16/18 11/14/18  Biagio Borg, MD  gabapentin (NEURONTIN) 100 MG capsule Take 1 capsule (100 mg total) by mouth 3 (three) times daily. 03/14/17   Biagio Borg, MD  hydrochlorothiazide (HYDRODIURIL) 12.5 MG tablet Take 1 tablet (12.5 mg total) by mouth daily. 05/27/19   Biagio Borg, MD  hydrocortisone 2.5 % cream Apply topically 2 (two) times daily. 11/09/17   Biagio Borg, MD  loratadine (CLARITIN) 10 MG tablet Take 10 mg by mouth daily as needed for allergies.    [provider]  meloxicam (MOBIC) 15 MG tablet Take 0.5-1 tablets (7.5-15 mg total) by mouth daily. 09/06/17   Jaynee Eagles, PA-C  zolpidem (AMBIEN) 10 MG tablet Take 1 tablet (10 mg total) by mouth at bedtime as needed for up to 30 days for sleep. 08/01/18 08/31/18  Biagio Borg, MD    Allergies    Patient has no known allergies.  Review of Systems   Review of Systems  Constitutional: Negative for fever.  HENT: Negative for sore throat.   Eyes: Negative for visual disturbance.  Respiratory: Negative for cough and shortness of breath.   Cardiovascular: Negative for chest pain.  Gastrointestinal: Positive for abdominal pain. Negative for diarrhea, hematemesis, hematochezia, nausea and vomiting.  Genitourinary: Negative for dysuria, hematuria, vaginal bleeding and vaginal discharge.  Musculoskeletal: Positive for back pain.  Skin: Negative for rash.  Neurological: Negative for headaches.    Physical Exam Updated Vital Signs BP (!) 188/114 (BP Location: Left Arm)   Pulse 79   Temp 98.6 F (37 C) (Oral)   Resp 16   Ht 5\' 3"  (1.6 m)   Wt 81.6 kg   SpO2 100%   BMI 31.89 kg/m   Physical Exam Vitals and nursing note reviewed.  Constitutional:      General: She is not in acute distress.    Appearance: She is well-developed.  HENT:     Head: Normocephalic and atraumatic.  Eyes:     Conjunctiva/sclera: Conjunctivae normal.  Cardiovascular:     Rate and Rhythm: Normal rate and regular rhythm.     Heart sounds: No murmur.  Pulmonary:     Effort: Pulmonary effort is normal. No respiratory distress.     Breath sounds: Normal breath sounds.  Abdominal:     Palpations: Abdomen is soft.     Tenderness: There is abdominal tenderness in the right upper quadrant. There is no guarding or  rebound. Negative signs include Murphy's sign and McBurney's sign.  Musculoskeletal:        General: No deformity or signs of injury. Normal range of motion.     Cervical back: Neck supple.  Skin:    General: Skin is warm and dry.     Capillary Refill: Capillary refill takes less than 2 seconds.  Neurological:     General: No focal deficit present.     Mental Status: She is alert.     ED Results / Procedures / Treatments   Labs (all labs ordered are listed, but only abnormal results are displayed) Labs Reviewed  CBC - Abnormal; Notable for the following components:      Result Value   RBC 5.12 (*)  All other components within normal limits  URINALYSIS, ROUTINE W REFLEX MICROSCOPIC - Abnormal; Notable for the following components:   APPearance CLOUDY (*)    All other components within normal limits  LIPASE, BLOOD  COMPREHENSIVE METABOLIC PANEL  I-STAT BETA HCG BLOOD, ED ([facility], [facility], [facility] ONLY)    EKG None  Radiology CT Abdomen Pelvis W Contrast  Result Date: 08/26/2019 CLINICAL DATA:  Right upper quadrant abdominal pain EXAM: CT ABDOMEN AND PELVIS WITH CONTRAST TECHNIQUE: Multidetector CT imaging of the abdomen and pelvis was performed using the standard protocol following bolus administration of intravenous contrast. CONTRAST:  158mL OMNIPAQUE IOHEXOL 300 MG/ML  SOLN COMPARISON:  08/26/2019 FINDINGS: Lower chest: There is a 2.0 x 1.3 cm soft tissue mass abutting the right posterior eighth rib near the costovertebral junction. This is incompletely evaluated on this abdominal CT. Based on location, this could reflect a nerve sheath tumor, though dedicated chest CT may be useful. No acute parenchymal lung disease. Hepatobiliary: Gallbladder remains decompressed, with no CT evidence of cholelithiasis or cholecystitis. The liver is unremarkable. Pancreas: Unremarkable. No pancreatic ductal dilatation or surrounding inflammatory changes. Spleen: Normal in size without focal abnormality.  Adrenals/Urinary Tract: Adrenal glands are unremarkable. Kidneys are normal, without renal calculi, focal lesion, or hydronephrosis. Bladder is unremarkable. Stomach/Bowel: No bowel obstruction or ileus. Normal appendix right lower quadrant. No inflammatory changes. Vascular/Lymphatic: No significant vascular findings are present. No enlarged abdominal or pelvic lymph nodes. Reproductive: Uterus is heterogeneous and mildly enlarged consistent with fibroids. No adnexal masses. Other: No abdominal wall hernia or abnormality. No abdominopelvic ascites. Musculoskeletal: No acute or destructive bony lesions. Reconstructed images demonstrate no additional findings. IMPRESSION: 1. Soft tissue mass abutting the right posterior eighth rib, which may reflect schwannoma or other benign nerve sheath tumor based on location. This is incompletely evaluated on this study, and follow-up chest CT with IV contrast may be useful. 2. Otherwise no acute intra-abdominal or intrapelvic process. 3. Fibroid uterus. Electronically Signed   By: Randa Ngo M.D.   On: 08/26/2019 21:43   DG Chest Port 1 View  Result Date: 08/26/2019 CLINICAL DATA:  Right-sided chest pain EXAM: PORTABLE CHEST 1 VIEW COMPARISON:  None. FINDINGS: The heart size and mediastinal contours are within normal limits. Mild aortic atherosclerosis. Both lungs are clear. The visualized skeletal structures are unremarkable. Minimal linear atelectasis or scarring at the bases. IMPRESSION: No active disease. Electronically Signed   By: Donavan Foil M.D.   On: 08/26/2019 19:12   US Abdomen Limited RUQ  Result Date: 08/26/2019 CLINICAL DATA:  Right upper quadrant pain since this morning EXAM: ULTRASOUND ABDOMEN LIMITED RIGHT UPPER QUADRANT COMPARISON:  None. FINDINGS: Gallbladder: Gallbladder is relatively decompressed. There may be minimal gallbladder sludge. No evidence of shadowing gallstone or acute cholecystitis. Common bile duct: Diameter: 2 mm Liver: No focal  lesion identified. Within normal limits in parenchymal echogenicity. Portal vein is patent on color Doppler imaging with normal direction of blood flow towards the liver. Other: None. IMPRESSION: 1. Decompressed gallbladder limits evaluation. 2. Likely minimal gallbladder sludge without cholelithiasis or cholecystitis. Electronically Signed   By: Randa Ngo M.D.   On: 08/26/2019 20:15    Procedures Procedures (including critical care time)  Medications Ordered in ED Medications  sodium chloride flush (NS) 0.9 % injection 3 mL (has no administration in time range)  ketorolac (TORADOL) 30 MG/ML injection 30 mg (has no administration in time range)  fentaNYL (SUBLIMAZE) injection 50 mcg (has no administration in time range)  ED Course  I have reviewed the triage vital signs and the nursing notes.  Pertinent labs & imaging results that were available during my care of the patient were reviewed by me and considered in my medical decision making (see chart for details).  Clinical Course as of Aug 26 913  Mon Aug 26, 2019  1833 Differential includes biliary colic, cholecystitis, peptic ulcer disease, musculoskeletal, pyelonephritis, ureterolithiasis   [MB]  1834 Patient is hypertensive here.  Otherwise nontoxic-appearing.  Normal white count normal LFTs normal renal function normal urine pregnancy test negative.  Ordered some pain medications and right upper quadrant ultrasound.   [MB]  1913 Chest x-ray showing no pneumothorax no gross infiltrates interpreted by me.  Awaiting radiology reading.   [MB]  2203 CT showing in the contracted gallbladder.  They also comment upon soft tissue mass in the right posterior costovertebral junction.  Recommendation from radiology is a CT with IV contrast.  She is already had a contrast load so I reviewed all this with the patient she has a PCP appointment this week.  Will cut-and-paste the impression from the radiologist so her doctor knows what is going  on.   [MB]    Clinical Course User Index [MB] Hayden Rasmussen, MD   MDM Rules/Calculators/A&P                       Final Clinical Impression(s) / ED Diagnoses Final diagnoses:  RUQ abdominal pain    Rx / DC Orders ED Discharge Orders         Ordered    oxyCODONE-acetaminophen (PERCOCET/ROXICET) 5-325 MG tablet  Every 6 hours PRN     08/26/19 2206           Hayden Rasmussen, MD 08/27/19 [phone]

## 2019-08-26 NOTE — Discharge Instructions (Signed)
You were seen in the emergency department for right upper abdominal pain radiating through to your back.  Your lab work did not show any serious findings and your ultrasound showed some possible sludge in her gallbladder.  The CAT scan showed a possible soft tissue mass at the junction between your spine and rib.  This will need to follow-up CAT scan that can be set up by your primary care doctor.  A copy of the report of the CAT scan is included below.  We are prescribing you some pain medicine to use as needed.  Please follow-up with your doctor and return if any worsening symptoms.  IMPRESSION:  1. Soft tissue mass abutting the right posterior eighth rib, which  may reflect schwannoma or other benign nerve sheath tumor based on  location. This is incompletely evaluated on this study, and  follow-up chest CT with IV contrast may be useful.  2. Otherwise no acute intra-abdominal or intrapelvic process.  3. Fibroid uterus.

## 2019-08-26 NOTE — ED Triage Notes (Signed)
Pt. Stated, I started having pain on my rt. Side under my breast and lower.

## 2019-08-26 NOTE — ED Notes (Signed)
Patient verbalizes understanding of discharge instructions. Opportunity for questioning and answers were provided. Armband removed by staff, pt discharged from ED ambulatory to home.  

## 2019-08-27 ENCOUNTER — Other Ambulatory Visit: Payer: 59

## 2019-08-28 ENCOUNTER — Other Ambulatory Visit: Payer: Self-pay

## 2019-08-28 ENCOUNTER — Other Ambulatory Visit (INDEPENDENT_AMBULATORY_CARE_PROVIDER_SITE_OTHER): Payer: 59

## 2019-08-28 DIAGNOSIS — E559 Vitamin D deficiency, unspecified: Secondary | ICD-10-CM

## 2019-08-28 DIAGNOSIS — E538 Deficiency of other specified B group vitamins: Secondary | ICD-10-CM | POA: Diagnosis not present

## 2019-08-28 DIAGNOSIS — R739 Hyperglycemia, unspecified: Secondary | ICD-10-CM

## 2019-08-28 DIAGNOSIS — Z0000 Encounter for general adult medical examination without abnormal findings: Secondary | ICD-10-CM

## 2019-08-28 DIAGNOSIS — E611 Iron deficiency: Secondary | ICD-10-CM | POA: Diagnosis not present

## 2019-08-28 LAB — CBC WITH DIFFERENTIAL/PLATELET
Basophils Absolute: 0 10*3/uL (ref 0.0–0.1)
Basophils Relative: 0.5 % (ref 0.0–3.0)
Eosinophils Absolute: 0.2 10*3/uL (ref 0.0–0.7)
Eosinophils Relative: 2.9 % (ref 0.0–5.0)
HCT: 39.7 % (ref 36.0–46.0)
Hemoglobin: 12.9 g/dL (ref 12.0–15.0)
Lymphocytes Relative: 53.5 % — ABNORMAL HIGH (ref 12.0–46.0)
Lymphs Abs: 4 10*3/uL (ref 0.7–4.0)
MCHC: 32.5 g/dL (ref 30.0–36.0)
MCV: 82.5 fl (ref 78.0–100.0)
Monocytes Absolute: 0.9 10*3/uL (ref 0.1–1.0)
Monocytes Relative: 11.8 % (ref 3.0–12.0)
Neutro Abs: 2.4 10*3/uL (ref 1.4–7.7)
Neutrophils Relative %: 31.3 % — ABNORMAL LOW (ref 43.0–77.0)
Platelets: 260 10*3/uL (ref 150.0–400.0)
RBC: 4.81 Mil/uL (ref 3.87–5.11)
RDW: 15.3 % (ref 11.5–15.5)
WBC: 7.6 10*3/uL (ref 4.0–10.5)

## 2019-08-28 LAB — LIPID PANEL
Cholesterol: 178 mg/dL (ref 0–200)
HDL: 68.8 mg/dL (ref >39.00)
LDL Cholesterol: 96 mg/dL (ref 0–99)
NonHDL: 109.05
Total CHOL/HDL Ratio: 3
Triglycerides: 65 mg/dL (ref 0.0–149.0)
VLDL: 13 mg/dL (ref 0.0–40.0)

## 2019-08-28 LAB — IBC PANEL
Iron: 40 ug/dL — ABNORMAL LOW (ref 42–145)
Saturation Ratios: 9.9 % — ABNORMAL LOW (ref 20.0–50.0)
Transferrin: 288 mg/dL (ref 212.0–360.0)

## 2019-08-28 LAB — HEPATIC FUNCTION PANEL
ALT: 21 U/L (ref 0–35)
AST: 22 U/L (ref 0–37)
Albumin: 3.9 g/dL (ref 3.5–5.2)
Alkaline Phosphatase: 92 U/L (ref 39–117)
Bilirubin, Direct: 0 mg/dL (ref 0.0–0.3)
Total Bilirubin: 0.3 mg/dL (ref 0.2–1.2)
Total Protein: 7.5 g/dL (ref 6.0–8.3)

## 2019-08-28 LAB — BASIC METABOLIC PANEL
BUN: 12 mg/dL (ref 6–23)
CO2: 29 mEq/L (ref 19–32)
Calcium: 9.6 mg/dL (ref 8.4–10.5)
Chloride: 104 mEq/L (ref 96–112)
Creatinine, Ser: 0.71 mg/dL (ref 0.40–1.20)
GFR: 102.6 mL/min (ref >60.00)
Glucose, Bld: 97 mg/dL (ref 70–99)
Potassium: 3.7 mEq/L (ref 3.5–5.1)
Sodium: 140 mEq/L (ref 135–145)

## 2019-08-28 LAB — VITAMIN D 25 HYDROXY (VIT D DEFICIENCY, FRACTURES): VITD: 21.16 ng/mL — ABNORMAL LOW (ref 30.00–100.00)

## 2019-08-28 LAB — TSH: TSH: 1.52 u[IU]/mL (ref 0.35–4.50)

## 2019-08-28 LAB — VITAMIN B12: Vitamin B-12: 395 pg/mL (ref 211–911)

## 2019-08-29 LAB — URINALYSIS, ROUTINE W REFLEX MICROSCOPIC
Bilirubin Urine: NEGATIVE
Ketones, ur: NEGATIVE
Leukocytes,Ua: NEGATIVE
Nitrite: NEGATIVE
Specific Gravity, Urine: 1.02 (ref 1.000–1.030)
Urine Glucose: NEGATIVE
Urobilinogen, UA: 0.2 (ref 0.0–1.0)
pH: 7 (ref 5.0–8.0)

## 2019-08-29 LAB — HEMOGLOBIN A1C: Hgb A1c MFr Bld: 5.7 % (ref 4.6–6.5)

## 2019-08-30 ENCOUNTER — Encounter: Payer: Self-pay | Admitting: Internal Medicine

## 2019-08-30 ENCOUNTER — Ambulatory Visit (INDEPENDENT_AMBULATORY_CARE_PROVIDER_SITE_OTHER): Payer: 59 | Admitting: Internal Medicine

## 2019-08-30 ENCOUNTER — Other Ambulatory Visit: Payer: Self-pay

## 2019-08-30 VITALS — BP 144/88 | HR 83 | Temp 98.1°F | Ht 63.0 in | Wt 192.0 lb

## 2019-08-30 DIAGNOSIS — F17200 Nicotine dependence, unspecified, uncomplicated: Secondary | ICD-10-CM | POA: Diagnosis not present

## 2019-08-30 DIAGNOSIS — R911 Solitary pulmonary nodule: Secondary | ICD-10-CM | POA: Diagnosis not present

## 2019-08-30 DIAGNOSIS — Z0001 Encounter for general adult medical examination with abnormal findings: Secondary | ICD-10-CM

## 2019-08-30 DIAGNOSIS — E559 Vitamin D deficiency, unspecified: Secondary | ICD-10-CM | POA: Insufficient documentation

## 2019-08-30 DIAGNOSIS — R918 Other nonspecific abnormal finding of lung field: Secondary | ICD-10-CM | POA: Insufficient documentation

## 2019-08-30 DIAGNOSIS — F329 Major depressive disorder, single episode, unspecified: Secondary | ICD-10-CM

## 2019-08-30 DIAGNOSIS — R739 Hyperglycemia, unspecified: Secondary | ICD-10-CM

## 2019-08-30 DIAGNOSIS — F32A Depression, unspecified: Secondary | ICD-10-CM

## 2019-08-30 DIAGNOSIS — D509 Iron deficiency anemia, unspecified: Secondary | ICD-10-CM | POA: Diagnosis not present

## 2019-08-30 DIAGNOSIS — J4520 Mild intermittent asthma, uncomplicated: Secondary | ICD-10-CM

## 2019-08-30 DIAGNOSIS — F419 Anxiety disorder, unspecified: Secondary | ICD-10-CM

## 2019-08-30 MED ORDER — VARENICLINE TARTRATE 1 MG PO TABS: 1.0000 mg | ORAL_TABLET | Freq: Two times a day (BID) | ORAL | 1 refills | Status: DC

## 2019-08-30 MED ORDER — VITAMIN D (ERGOCALCIFEROL) 1.25 MG (50000 UNIT) PO CAPS: 50000.0000 [IU] | ORAL_CAPSULE | ORAL | 0 refills | Status: DC

## 2019-08-30 MED ORDER — OXYCODONE-ACETAMINOPHEN 5-325 MG PO TABS: 1.0000 | ORAL_TABLET | Freq: Four times a day (QID) | ORAL | 0 refills | Status: DC | PRN

## 2019-08-30 MED ORDER — POLYSACCHARIDE IRON COMPLEX 150 MG PO CAPS: 150.0000 mg | ORAL_CAPSULE | Freq: Every day | ORAL | 1 refills | Status: DC

## 2019-08-30 MED ORDER — CHANTIX STARTING MONTH PAK 0.5 MG X 11 & 1 MG X 42 PO TABS: ORAL_TABLET | ORAL | 0 refills | Status: DC

## 2019-08-30 NOTE — Patient Instructions (Signed)
Please stop smoking  You will be contacted regarding the referral for: CT for the chest  Please take Vitamin D 50000 units weekly for 12 weeks, then plan to change to OTC Vitamin D3 at 2000 units per day, indefinitely.  Please take all new medication as prescribed - the iron pill once per day, and the Chantix for quitting smoking  You will be contacted regarding the referral for: Gastroenterology  Please continue all other medications as before, and refills have been done if requested - the pain medication  Please have the pharmacy call with any other refills you may need.  Please continue your efforts at being more active, low cholesterol diet, and weight control.  You are otherwise up to date with prevention measures today.  Please keep your appointments with your specialists as you may have planned  Please make an Appointment to return in 6 months, or sooner if needed

## 2019-08-30 NOTE — Progress Notes (Addendum)
Subjective:    Patient ID: Michelle Solomon, female    DOB: [birth year], 59 y.o.   MRN: [id number]  HPI  Here for wellness and f/u;  Overall doing ok;  Pt denies Chest pain, worsening SOB, DOE, wheezing, orthopnea, PND, worsening LE edema, palpitations, dizziness or syncope.  Pt denies neurological change such as new headache, facial or extremity weakness.  Pt denies polydipsia, polyuria, or low sugar symptoms. Pt states overall good compliance with treatment and medications, good tolerability, and has been trying to follow appropriate diet.  Pt denies worsening depressive symptoms, suicidal ideation or panic. No fever, night sweats, wt loss, loss of appetite, or other constitutional symptoms.  Pt states good ability with ADL's, has low fall risk, home safety reviewed and adequate, no other significant changes in hearing or vision, and only occasionally active with exercise. Also recent imaging with right subpleural chest pain.  No overt bleeding.  Wants to quit smoking Past Medical History:  Diagnosis Date  . Acute bronchitis 06/06/2010  . ALLERGIC RHINITIS 05/29/2007  . Anal or rectal pain 06/23/2008  . ANEMIA-IRON DEFICIENCY 05/29/2007  . ASTHMA 05/29/2007  . 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 CONJUNCTIVITIS, ALLERGIC 11/27/2008  . HEMORRHOIDS 06/16/2008  . NIPPLE DISCHARGE 01/21/2010  . Other specified forms of hearing loss 10/08/2009  . Overweight(278.02) 05/29/2007  . RASH-NONVESICULAR 05/29/2007  . Wheezing 06/22/2010   No past surgical history on file.  reports that she has been smoking cigarettes. She has never used smokeless tobacco. She reports current alcohol use. She reports that she does not use drugs. family history includes Allergies in her sister; Cancer in an other family member; Hypertension in an other family member. No Known Allergies Current Outpatient Medications on File Prior to Visit  Medication Sig Dispense Refill  . ALPRAZolam (XANAX) 0.5 MG tablet  Take 1 tablet (0.5 mg total) by mouth 2 (two) times daily as needed for anxiety or sleep. 60 tablet 2  . buPROPion (WELLBUTRIN XL) 300 MG 24 hr tablet Take 1 tablet (300 mg total) by mouth daily. 90 tablet 0  . cyclobenzaprine (FLEXERIL) 5 MG tablet Take 1 tablet (5 mg total) by mouth 3 (three) times daily as needed. 90 tablet 1  . gabapentin (NEURONTIN) 100 MG capsule Take 1 capsule (100 mg total) by mouth 3 (three) times daily. 90 capsule 3  . hydrochlorothiazide (HYDRODIURIL) 12.5 MG tablet Take 1 tablet (12.5 mg total) by mouth daily. 90 tablet 0  . hydrocortisone 2.5 % cream Apply topically 2 (two) times daily. 30 g 1  . loratadine (CLARITIN) 10 MG tablet Take 10 mg by mouth daily as needed for allergies.    . meloxicam (MOBIC) 15 MG tablet Take 0.5-1 tablets (7.5-15 mg total) by mouth daily. 30 tablet 0  . escitalopram (LEXAPRO) 10 MG tablet Take 1 tablet (10 mg total) by mouth daily. 90 tablet 3  . zolpidem (AMBIEN) 10 MG tablet Take 1 tablet (10 mg total) by mouth at bedtime as needed for up to 30 days for sleep. 30 tablet 5   No current facility-administered medications on file prior to visit.   Review of Systems All otherwise neg per pt    Objective:   Physical Exam BP (!) 144/88   Pulse 83   Temp 98.1 F (36.7 C)   Ht 5\' 3"  (1.6 m)   Wt 192 lb (87.1 kg)   SpO2 99%   BMI 34.01 kg/m  VS noted,  Constitutional: Pt appears in NAD  HENT: Head: NCAT.  Right Ear: External ear normal.  Left Ear: External ear normal.  Eyes: . Pupils are equal, round, and reactive to light. Conjunctivae and EOM are normal Nose: without d/c or deformity Neck: Neck supple. Gross normal ROM Cardiovascular: Normal rate and regular rhythm.   Pulmonary/Chest: Effort normal and breath sounds without rales or wheezing.  Abd:  Soft, NT, ND, + BS, no organomegaly Neurological: Pt is alert. At baseline orientation, motor grossly intact Skin: Skin is warm. No rashes, other new lesions, no LE  edema Psychiatric: Pt behavior is normal without agitation  All otherwise neg per pt Lab Results  Component Value Date   WBC 7.6 08/28/2019   HGB 12.9 08/28/2019   HCT 39.7 08/28/2019   PLT 260.0 08/28/2019   GLUCOSE 97 08/28/2019   CHOL 178 08/28/2019   TRIG 65.0 08/28/2019   HDL 68.80 08/28/2019   LDLCALC 96 08/28/2019   ALT 21 08/28/2019   AST 22 08/28/2019   NA 140 08/28/2019   K 3.7 08/28/2019   CL 104 08/28/2019   CREATININE 0.71 08/28/2019   BUN 12 08/28/2019   CO2 29 08/28/2019   TSH 1.52 08/28/2019   HGBA1C 5.7 08/28/2019      Assessment & Plan:

## 2019-09-01 ENCOUNTER — Encounter: Payer: Self-pay | Admitting: Internal Medicine

## 2019-09-01 NOTE — Assessment & Plan Note (Signed)
stable overall by history and exam, recent data reviewed with pt, and pt to continue medical treatment as before,  to f/u any worsening symptoms or concerns  

## 2019-09-01 NOTE — Assessment & Plan Note (Addendum)
For chest ct r/o malignancy  I spent 40 minutes in addition to time for wellness examination in preparing to see the patient by review of recent labs, imaging and procedures, obtaining and reviewing separately obtained history, communicating with the patient and family or caregiver, ordering medications, tests or procedures, and documenting clinical information in the EHR including the differential Dx, treatment, and any further evaluation and other management of pulmonary mass, vit d deficiency, hyperglycemia, asthma, iron deficiency anemia, smoker, anxiety

## 2019-09-01 NOTE — Assessment & Plan Note (Signed)

## 2019-09-01 NOTE — Assessment & Plan Note (Signed)
For oral replacement 

## 2019-09-01 NOTE — Assessment & Plan Note (Signed)
For replacement, refer GI

## 2019-09-01 NOTE — Assessment & Plan Note (Signed)
Counseled to quit, for chantix asd,  to f/u any worsening symptoms or concerns

## 2019-09-02 ENCOUNTER — Encounter: Payer: Self-pay | Admitting: Nurse Practitioner

## 2019-09-06 ENCOUNTER — Ambulatory Visit (INDEPENDENT_AMBULATORY_CARE_PROVIDER_SITE_OTHER)
Admission: RE | Admit: 2019-09-06 | Discharge: 2019-09-06 | Disposition: A | Discharge status: Home / Self Care | Payer: 59 | Source: Ambulatory Visit | Attending: Internal Medicine | Admitting: Internal Medicine

## 2019-09-06 ENCOUNTER — Other Ambulatory Visit: Payer: Self-pay

## 2019-09-06 DIAGNOSIS — R911 Solitary pulmonary nodule: Secondary | ICD-10-CM

## 2019-09-06 MED ORDER — IOHEXOL 300 MG/ML  SOLN
75.0000 mL | Freq: Once | INTRAMUSCULAR | Status: AC | PRN
  Administered 2019-09-06: 75 mL via INTRAVENOUS

## 2019-09-09 ENCOUNTER — Other Ambulatory Visit: Payer: Self-pay | Admitting: Internal Medicine

## 2019-09-09 DIAGNOSIS — R911 Solitary pulmonary nodule: Secondary | ICD-10-CM

## 2019-09-11 ENCOUNTER — Telehealth: Payer: Self-pay

## 2019-09-11 NOTE — Telephone Encounter (Signed)
Pt notified of CT results, she verbalized understanding.

## 2019-09-11 NOTE — Telephone Encounter (Signed)
New message     The patient calling for CT test results.

## 2019-09-12 ENCOUNTER — Ambulatory Visit (INDEPENDENT_AMBULATORY_CARE_PROVIDER_SITE_OTHER): Payer: 59 | Admitting: Nurse Practitioner

## 2019-09-12 ENCOUNTER — Other Ambulatory Visit: Payer: Self-pay

## 2019-09-12 ENCOUNTER — Encounter: Payer: Self-pay | Admitting: Nurse Practitioner

## 2019-09-12 VITALS — BP 138/92 | HR 88 | Temp 99.0°F | Ht 63.0 in | Wt 188.5 lb

## 2019-09-12 DIAGNOSIS — Z01818 Encounter for other preprocedural examination: Secondary | ICD-10-CM | POA: Diagnosis not present

## 2019-09-12 DIAGNOSIS — Z1211 Encounter for screening for malignant neoplasm of colon: Secondary | ICD-10-CM

## 2019-09-12 NOTE — Patient Instructions (Signed)
If you are age 58 or older, your body mass index should be between 23-30. Your Body mass index is 33.39 kg/m. If this is out of the aforementioned range listed, please consider follow up with your Primary Care Provider.  If you are age 68 or younger, your body mass index should be between 19-25. Your Body mass index is 33.39 kg/m. If this is out of the aformentioned range listed, please consider follow up with your Primary Care Provider.   You have been scheduled for a colonoscopy. Please follow written instructions given to you at your visit today.  Please pick up your prep supplies at the pharmacy within the next 1-3 days. If you use inhalers (even only as needed), please bring them with you on the day of your procedure. Your physician has requested that you go to www.startemmi.com and enter the access code given to you at your visit today. This web site gives a general overview about your procedure. However, you should still follow specific instructions given to you by our office regarding your preparation for the procedure.  HOLD IRON FIVE DAYS PRIOR TO YOUR PROCEDURE.  Thank you for choosing me and [facility].   Tye Savoy, NP

## 2019-09-12 NOTE — Progress Notes (Signed)
ASSESSMENT / PLAN:   Michelle Solomon is a 59 y.o. female with a pmh significant for, but not necessarily limited to,   # colon cancer screening.  -Patient will be scheduled for colonoscopy. The risks and benefits of colonoscopy with possible polypectomy / biopsies were discussed and the patient agrees to proceed.   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  --The rectal nodule was removed during flexible sigmoidoscopy / EUS. No recurrence on surveillance exams. .   # Abnormal chest CT scan.  --Soft tissue mass abutting the right posterior eighth rib possibly a schwannoma or other benign nerve sheath tumor --Found on CT scan done for evaluation of RUQ pain in ED 08/26/2019. PCP is evaluating.   HPI:     Chief Complaint:  None, here for colon cancer screening   ** History comes from chart and patient  This patient is a 59 year old female last seen here approximately 10 years ago.  She has a history of a low-grade neuroendocrine tumor of the rectum in Aug 2006 found on colonoscopy performed for evaluation of rectal bleeding.  She underwent rectal EUS by Dr. Ardis Hughs 02/15/2005 , sample of the nodule taken and compatible with carcinoid tumor.  The lesion was not seen on follow-up flexible sigmoidoscopy 03/18/2005.  Surveillance flexible sigmoidoscopy December 2009 showed a 10 mm rectal nodule, felt to be recurrent of carcinoid but path c/w rectal prolapse.  She had another flexible sigmoidoscopy with EUS by Dr. Ardis Hughs February 2010.  There was a 8 mm rectal esion not involving the muscularis propria layer of the rectal wall. No rectal lymphadenopathy.  Nodule was removed and compatible with low-grade neuroendocrine tumor.  Follow-up flexible sigmoidoscopy October 2011 showed only scar tissue in the rectum.    Patient was due for full colonoscopy in 2016 but we have been unable to reach her.  PCP help facilitate this appointment today.  Patient isn't having any GI complaints. Specifically no rectal bleeding, bowel changes.     Past Medical History:  Diagnosis Date  . Acute bronchitis 06/06/2010  . ALLERGIC RHINITIS 05/29/2007  . Anal or rectal pain 06/23/2008  . ANEMIA-IRON DEFICIENCY 05/29/2007  . Anxiety   . ASTHMA 05/29/2007  . 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 Colon polyp   . CONJUNCTIVITIS, ALLERGIC 11/27/2008  . HEMORRHOIDS 06/16/2008  . HTN (hypertension)   . NIPPLE DISCHARGE 01/21/2010  . Other specified forms of hearing loss 10/08/2009  . Overweight(278.02) 05/29/2007  . RASH-NONVESICULAR 05/29/2007  . Wheezing 06/22/2010     Past Surgical History:  Procedure Laterality Date  . NO PAST SURGERIES     Family History  Problem Relation Age of Onset  . Allergies Sister   . Diabetes Sister   . Hypertension Sister   . Hypertension Mother   . Prostate cancer Father   . Hypertension Brother   . Hypertension Sister   . Diabetes Paternal Aunt   . Cancer Paternal Aunt        type unknown  . Prostate cancer Brother   . Stroke Maternal Aunt   . Hypertension Maternal Aunt    Social History   Tobacco Use  . Smoking status: Current Every Day Smoker    Types: Cigarettes  . Smokeless tobacco: Never Used  . Tobacco comment: 1 pack weekly  Substance Use Topics  . Alcohol use:  Yes    Comment: ocassional  . Drug use: No   Current Outpatient Medications  Medication Sig Dispense Refill  . ALPRAZolam (XANAX) 0.5 MG tablet Take 1 tablet (0.5 mg total) by mouth 2 (two) times daily as needed for anxiety or sleep. 60 tablet 2  . buPROPion (WELLBUTRIN XL) 300 MG 24 hr tablet Take 1 tablet (300 mg total) by mouth daily. 90 tablet 0  . cyclobenzaprine (FLEXERIL) 5 MG tablet Take 1 tablet (5 mg total) by mouth 3 (three) times daily as needed. 90 tablet 1  . escitalopram (LEXAPRO) 10 MG tablet Take 1 tablet (10 mg total) by mouth daily. 90 tablet 3  . hydrochlorothiazide (HYDRODIURIL) 12.5 MG tablet  Take 1 tablet (12.5 mg total) by mouth daily. 90 tablet 0  . hydrocortisone 2.5 % cream Apply topically 2 (two) times daily. 30 g 1  . iron polysaccharides (NU-IRON) 150 MG capsule Take 1 capsule (150 mg total) by mouth daily. 90 capsule 1  . loratadine (CLARITIN) 10 MG tablet Take 10 mg by mouth daily as needed for allergies.    [name] Kitchen oxyCODONE-acetaminophen (PERCOCET/ROXICET) 5-325 MG tablet Take 1 tablet by mouth every 6 (six) hours as needed for severe pain. 30 tablet 0  . Vitamin D, Ergocalciferol, (DRISDOL) 1.25 MG (50000 UNIT) CAPS capsule Take 1 capsule (50,000 Units total) by mouth every 7 (seven) days. 12 capsule 0  . zolpidem (AMBIEN) 10 MG tablet Take 1 tablet (10 mg total) by mouth at bedtime as needed for up to 30 days for sleep. 30 tablet 5   No current facility-administered medications for this visit.   No Known Allergies   Review of Systems: All other systems reviewed and negative except where noted in HPI.   Serum creatinine: 0.71 mg/dL 08/28/19 1314 Estimated creatinine clearance: 80.3 mL/min   Physical Exam:    Wt Readings from Last 3 Encounters:  09/12/19 188 lb 8 oz (85.5 kg)  08/30/19 192 lb (87.1 kg)  08/26/19 180 lb (81.6 kg)    BP (!) 138/92 (BP Location: Left Arm, Patient Position: Sitting, Cuff Size: Normal)   Pulse 88   Temp 99 F (37.2 C)   Ht 5\' 3"  (1.6 m) Comment: height measured without shoes  Wt 188 lb 8 oz (85.5 kg)   LMP 10/26/2017   BMI 33.39 kg/m  Constitutional:  Pleasant female in no acute distress. Psychiatric: Normal mood and affect. Behavior is normal. EENT: Pupils normal.  Conjunctivae are normal. No scleral icterus. Neck supple.  Cardiovascular: Normal rate, regular rhythm. No edema Pulmonary/chest: Effort normal and breath sounds normal. No wheezing, rales or rhonchi. Abdominal: Soft, nondistended, nontender. Bowel sounds active throughout. There are no masses palpable. No hepatomegaly. Neurological: Alert and oriented to person  place and time. Skin: Skin is warm and dry. No rashes noted.  Tye Savoy, NP  09/12/2019, 11:23 AM  Cc:   Biagio Borg, MD

## 2019-10-01 ENCOUNTER — Telehealth: Payer: Self-pay | Admitting: Internal Medicine

## 2019-10-01 NOTE — Telephone Encounter (Signed)
Hi Dr. Carlean Purl,  FYI: This patient was seen at the office and called needing to reschedule her colon 10/08/19 to 11/12/19 due to having an emergency and going out of town.    Thank you

## 2019-10-01 NOTE — Telephone Encounter (Signed)
OK - thanks

## 2019-10-08 ENCOUNTER — Ambulatory Visit (INDEPENDENT_AMBULATORY_CARE_PROVIDER_SITE_OTHER): Payer: 59 | Admitting: Pulmonary Disease

## 2019-10-08 ENCOUNTER — Encounter: Payer: Self-pay | Admitting: Pulmonary Disease

## 2019-10-08 ENCOUNTER — Encounter: Payer: 59 | Admitting: Internal Medicine

## 2019-10-08 ENCOUNTER — Other Ambulatory Visit: Payer: Self-pay

## 2019-10-08 VITALS — BP 140/100 | HR 87 | Temp 97.0°F | Ht 63.0 in | Wt 192.2 lb

## 2019-10-08 DIAGNOSIS — R918 Other nonspecific abnormal finding of lung field: Secondary | ICD-10-CM

## 2019-10-08 NOTE — Patient Instructions (Signed)
Soft tissue density in the right lung --We will order a PET scan, depending on the results we will plan on biopsy of the lung spot  Follow-up in 1 month with me or Dr. Tamala Julian

## 2019-10-08 NOTE — Progress Notes (Signed)
Subjective:   PATIENT ID: Michelle Solomon Post GENDER: female DOB: [birth year], MRN: [id number]   HPI  Chief Complaint  Patient presents with  . Consult    pulmonary nodule    Reason for Visit: New consult for abnormal lung scan  [name] is a 59 year old female active smoker who presents for "lung nodule."  Reviewed note from 09/12/19 from GI by Tye Savoy NP. Patient with hx of neuroendocrine tumor of rectum in 2006 with recurrence in 2010. The rectal nodule was removed and per note she had not recurrence of surveillance exams. She was due for a full colonoscopy in 2016 however she was lost to follow-up. She has now re-established care with IG and is scheduled for colonoscopy in May 2021.  She recently had CT Chest on 09/06/19 which demonstrated ill-define 2.3 x 1.8 soft tissue mass along the right posterior thoracic wall adjacent to the 8th intercostal space. This was ordered by her PCP for complaints of atypical right sided chest pain.  She does report bilateral chest pain along her sides or soreness that is reproducible. Denies fevers, chills. She hot flashes but attributes it to perimenopausal symptoms. Denies drenching night sweats, unintentional weight loss. Denies headaches, palpitations, numbness. Denies shortness of breath, cough, hemoptysis.   Social History: Active smoker x 40 years 1/2 ppd  I have personally reviewed patient's past medical/family/social history, allergies, current medications.  Past Medical History:  Diagnosis Date  . Acute bronchitis 06/06/2010  . ALLERGIC RHINITIS 05/29/2007  . Anal or rectal pain 06/23/2008  . ANEMIA-IRON DEFICIENCY 05/29/2007  . Anxiety   . ASTHMA 05/29/2007  . 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 Colon polyp   . CONJUNCTIVITIS, ALLERGIC 11/27/2008  . HEMORRHOIDS 06/16/2008  . HTN (hypertension)   . NIPPLE DISCHARGE 01/21/2010  . Other specified forms of hearing loss 10/08/2009  . Overweight(278.02)  05/29/2007  . RASH-NONVESICULAR 05/29/2007  . Wheezing 06/22/2010     Family History  Problem Relation Age of Onset  . Allergies Sister   . Diabetes Sister   . Hypertension Sister   . Hypertension Mother   . Prostate cancer Father   . Hypertension Brother   . Hypertension Sister   . Diabetes Paternal Aunt   . Cancer Paternal Aunt        type unknown  . Prostate cancer Brother   . Stroke Maternal Aunt   . Hypertension Maternal Aunt      Social History   Occupational History  . Occupation: CARD REPLACEMEN    Employer: [employer]  . Occupation: customer service  Tobacco Use  . Smoking status: Current Every Day Smoker    Types: Cigarettes  . Smokeless tobacco: Never Used  . Tobacco comment: 1 pack weekly  Substance and Sexual Activity  . Alcohol use: Yes    Comment: ocassional  . Drug use: No  . Sexual activity: Not on file    No Known Allergies   Outpatient Medications Prior to Visit  Medication Sig Dispense Refill  . ALPRAZolam (XANAX) 0.5 MG tablet Take 1 tablet (0.5 mg total) by mouth 2 (two) times daily as needed for anxiety or sleep. 60 tablet 2  . buPROPion (WELLBUTRIN XL) 300 MG 24 hr tablet Take 1 tablet (300 mg total) by mouth daily. 90 tablet 0  . cyclobenzaprine (FLEXERIL) 5 MG tablet Take 1 tablet (5 mg total) by mouth 3 (three) times daily as needed. 90 tablet 1  . escitalopram (LEXAPRO) 10 MG  tablet Take 1 tablet (10 mg total) by mouth daily. 90 tablet 3  . hydrochlorothiazide (HYDRODIURIL) 12.5 MG tablet Take 1 tablet (12.5 mg total) by mouth daily. 90 tablet 0  . hydrocortisone 2.5 % cream Apply topically 2 (two) times daily. 30 g 1  . iron polysaccharides (NU-IRON) 150 MG capsule Take 1 capsule (150 mg total) by mouth daily. 90 capsule 1  . loratadine (CLARITIN) 10 MG tablet Take 10 mg by mouth daily as needed for allergies.    [name] Kitchen oxyCODONE-acetaminophen (PERCOCET/ROXICET) 5-325 MG tablet Take 1 tablet by mouth every 6 (six) hours as needed for  severe pain. 30 tablet 0  . Vitamin D, Ergocalciferol, (DRISDOL) 1.25 MG (50000 UNIT) CAPS capsule Take 1 capsule (50,000 Units total) by mouth every 7 (seven) days. 12 capsule 0  . zolpidem (AMBIEN) 10 MG tablet Take 1 tablet (10 mg total) by mouth at bedtime as needed for up to 30 days for sleep. 30 tablet 5   No facility-administered medications prior to visit.    Review of Systems  Constitutional: Negative for chills, diaphoresis, fever, malaise/fatigue and weight loss.  HENT: Negative for congestion, ear pain and sore throat.   Respiratory: Negative for cough, hemoptysis, sputum production, shortness of breath and wheezing.   Cardiovascular: Positive for chest pain. Negative for palpitations and leg swelling.  Gastrointestinal: Negative for abdominal pain, heartburn and nausea.  Genitourinary: Negative for frequency.  Musculoskeletal: Negative for joint pain and myalgias.  Skin: Negative for itching and rash.  Neurological: Negative for dizziness, weakness and headaches.  Endo/Heme/Allergies: Does not bruise/bleed easily.  Psychiatric/Behavioral: Negative for depression. The patient is not nervous/anxious.      Objective:   Vitals:   10/08/19 1054  BP: (!) 140/100  Pulse: 87  Temp: (!) 97 F (36.1 C)  TempSrc: Temporal  SpO2: 95%  Weight: 192 lb 3.2 oz (87.2 kg)  Height: 5\' 3"  (1.6 m)     Physical Exam: General: Well-appearing, no acute distress HENT: [place], AT Eyes: EOMI, no scleral icterus Respiratory: Clear to auscultation bilaterally.  No crackles, wheezing or rales Cardiovascular: RRR, -M/R/G, no JVD GI: BS+, soft, nontender Extremities:-Edema,-tenderness Neuro: AAO x4, CNII-XII grossly intact Skin: Intact, no rashes or bruising Psych: Normal mood, normal affect  Data Reviewed:  Imaging: CT Chest on 09/06/19 which demonstrated ill-define 2.3 x 1.8 soft tissue mass along the right posterior thoracic wall adjacent to the 8th intercostal space.  PFT: None on  file  Labs: CBC    Component Value Date/Time   WBC 7.6 08/28/2019 1314   RBC 4.81 08/28/2019 1314   HGB 12.9 08/28/2019 1314   HCT 39.7 08/28/2019 1314   PLT 260.0 08/28/2019 1314   MCV 82.5 08/28/2019 1314   MCH 26.8 08/26/2019 1653   MCHC 32.5 08/28/2019 1314   RDW 15.3 08/28/2019 1314   LYMPHSABS 4.0 08/28/2019 1314   MONOABS 0.9 08/28/2019 1314   EOSABS 0.2 08/28/2019 1314   BASOSABS 0.0 08/28/2019 1314   CMP Latest Ref Rng & Units 08/28/2019 08/26/2019 12/25/2017  Glucose 70 - 99 mg/dL 97 95 89  BUN 6 - 23 mg/dL 12 9 7   Creatinine 0.40 - 1.20 mg/dL 0.71 0.84 0.73  Sodium 135 - 145 mEq/L 140 137 142  Potassium 3.5 - 5.1 mEq/L 3.7 4.0 3.7  Chloride 96 - 112 mEq/L 104 98 103  CO2 19 - 32 mEq/L 29 30 29   Calcium 8.4 - 10.5 mg/dL 9.6 10.3 9.6  Total Protein 6.0 - 8.3 g/dL 7.5  7.2 -  Total Bilirubin 0.2 - 1.2 mg/dL 0.3 0.4 -  Alkaline Phos 39 - 117 U/L 92 89 -  AST 0 - 37 U/L 22 25 -  ALT 0 - 35 U/L 21 25 -   Imaging, labs and test noted above have been reviewed independently by me.    Assessment & Plan:   Discussion: 59 year old female active smoker with hx of neuroendocrine tumor of rectum in 2006 with recurrence in 2010 however lost to follow-up since 2016, now presents with chest pain and found with right-sided thoracic wall mass. Differential includes schwannoma, neurofibroma and possible recurrent neuroendocrine. Discussed imaging plan with Oncology and Radiology as noted below.  --MRI Thoracic WWO contrast --Pending results, will consider PET DOTATATE --Keep follow-up with GI for colonoscopy in May 2021  Health Maintenance Immunization History  Administered Date(s) Administered  . Influenza Split 04/05/2012  . Influenza Whole 05/05/2008, 04/03/2009  . Td 05/26/2008   CT Lung Screen - qualified. Will discuss at next visit  Orders Placed This Encounter  Procedures  . MR THORACIC SPINE W WO CONTRAST    Standing Status:   Future    Standing Expiration Date:    12/08/2020    Order Specific Question:   ** REASON FOR EXAM (FREE TEXT)    Answer:   R/o schwannoma or neurofibroma, patient with hx of low-grade neuroendocrine tumor    Order Specific Question:   GRA to provide read?    Answer:   Yes    Order Specific Question:   If indicated for the ordered procedure, I authorize the administration of contrast media per Radiology protocol    Answer:   Yes    Order Specific Question:   What is the patient's sedation requirement?    Answer:   No Sedation    Order Specific Question:   Does the patient have a pacemaker or implanted devices?    Answer:   No    Order Specific Question:   Preferred imaging location?    Answer:   [facility] (table limit-500 lbs)    Order Specific Question:   Radiology Contrast Protocol - do NOT remove file path    Answer:   \\[email or web address]  No orders of the defined types were placed in this encounter.   Return in about 6 weeks (around 11/19/2019).  I have spent a total time of 60-minutes on the day of the appointment reviewing prior documentation, coordinating care and discussing medical diagnosis and plan with the patient/family. Imaging, labs and tests included in this note have been reviewed and interpreted independently by me.  [place], MD [facility] 10/08/2019 10:47 AM  Office Number [phone]

## 2019-10-12 ENCOUNTER — Ambulatory Visit: Payer: 59 | Attending: Internal Medicine

## 2019-10-12 DIAGNOSIS — Z23 Encounter for immunization: Secondary | ICD-10-CM

## 2019-10-12 NOTE — Progress Notes (Signed)
   Covid-19 Vaccination Clinic  Name:  Michelle Solomon    MRN: [id number] DOB: Aug 28, 1960  10/12/2019  [name] was observed post Covid-19 immunization for 15 minutes without incident. She was provided with Vaccine Information Sheet and instruction to access the V-Safe system.   [name] was instructed to call 911 with any severe reactions post vaccine: [name] Kitchen Difficulty breathing  . Swelling of face and throat  . A fast heartbeat  . A bad rash all over body  . Dizziness and weakness   Immunizations Administered    Name Date Dose VIS Date Route   Pfizer COVID-19 Vaccine 10/12/2019  4:05 PM 0.3 mL 06/14/2019 Intramuscular   Manufacturer: [place]   Lot: [id number]   [place]: [id number]

## 2019-10-18 ENCOUNTER — Other Ambulatory Visit: Payer: Self-pay

## 2019-10-18 ENCOUNTER — Ambulatory Visit (HOSPITAL_COMMUNITY)
Admission: RE | Admit: 2019-10-18 | Discharge: 2019-10-18 | Disposition: A | Discharge status: Home / Self Care | Payer: 59 | Source: Ambulatory Visit | Attending: Pulmonary Disease | Admitting: Pulmonary Disease

## 2019-10-18 DIAGNOSIS — R918 Other nonspecific abnormal finding of lung field: Secondary | ICD-10-CM

## 2019-10-18 MED ORDER — GADOBUTROL 1 MMOL/ML IV SOLN
8.5000 mL | Freq: Once | INTRAVENOUS | Status: AC | PRN
  Administered 2019-10-18: 14:00:00 8.5 mL via INTRAVENOUS

## 2019-10-21 ENCOUNTER — Other Ambulatory Visit: Payer: Self-pay | Admitting: Pulmonary Disease

## 2019-10-21 ENCOUNTER — Telehealth: Payer: Self-pay | Admitting: Pulmonary Disease

## 2019-10-21 DIAGNOSIS — C7951 Secondary malignant neoplasm of bone: Secondary | ICD-10-CM

## 2019-10-21 NOTE — Progress Notes (Signed)
Michelle Solomon,  This is the patient we discussed on the phone with hx neuroendocrine tumor and now concerns for new metastatic lesions in the spine. Could you please arrange for patient to be seen as a new consult in your clinic this week?   Thank you, Wyatt Portela

## 2019-10-21 NOTE — Telephone Encounter (Signed)
The order was placed today. PCC's will the order drop in the workque? Does the oncology clinic call the pt? Please advise.

## 2019-10-21 NOTE — Progress Notes (Signed)
Discussed MR spine from 10/18/19 with Oncology. Urgent referral placed.  Rodman Pickle, M.D. [facility] 10/21/2019 3:34 PM

## 2019-10-22 ENCOUNTER — Telehealth: Payer: Self-pay | Admitting: Hematology and Oncology

## 2019-10-22 NOTE — Telephone Encounter (Signed)
I received this message from Oncology in case the patient calls back   [place], Hoy Register, Shirlean Mylar, MD  Good afternoon!   An oncology appointment has been scheduled for Ms. Dobias to see Dr. Lorenso Courier on Friday 4/23 at 8am. I attempted to call the pt and left the appointment date and time on her voicemail. I wanted to inform you of this in case the pt contacts you.   Have a good day!   Seth Bake

## 2019-10-22 NOTE — Telephone Encounter (Signed)
Spoke with pt, she states she is aware of her appt and nothing further is needed.

## 2019-10-22 NOTE — Telephone Encounter (Signed)
lmtcb for pt.  

## 2019-10-22 NOTE — Telephone Encounter (Signed)
Received a new patient referral from [facility] for metastatic cancer of the spine. Pt has been cld and schedule to see Dr. Lorenso Courier on 4/23 at 8am. Pt agreed to the appt date and time. Aware to arrive 15 minutes early.

## 2019-10-22 NOTE — Telephone Encounter (Signed)
This order goes into there [place] it has aslo been placed in proficient as a Urgent referral and they will contact the patient

## 2019-10-24 ENCOUNTER — Other Ambulatory Visit: Payer: Self-pay

## 2019-10-24 DIAGNOSIS — F329 Major depressive disorder, single episode, unspecified: Secondary | ICD-10-CM

## 2019-10-24 DIAGNOSIS — F32A Depression, unspecified: Secondary | ICD-10-CM

## 2019-10-24 MED ORDER — BUPROPION HCL ER (XL) 300 MG PO TB24: 300.0000 mg | ORAL_TABLET | Freq: Every day | ORAL | 0 refills | Status: DC

## 2019-10-25 ENCOUNTER — Encounter: Payer: Self-pay | Admitting: Hematology and Oncology

## 2019-10-25 ENCOUNTER — Inpatient Hospital Stay: Payer: 59 | Attending: Hematology and Oncology

## 2019-10-25 ENCOUNTER — Inpatient Hospital Stay (HOSPITAL_BASED_OUTPATIENT_CLINIC_OR_DEPARTMENT_OTHER): Payer: 59 | Admitting: Hematology and Oncology

## 2019-10-25 ENCOUNTER — Other Ambulatory Visit: Payer: Self-pay

## 2019-10-25 VITALS — BP 153/104 | HR 82 | Temp 98.9°F | Resp 18 | Ht 63.0 in | Wt 193.9 lb

## 2019-10-25 DIAGNOSIS — J45909 Unspecified asthma, uncomplicated: Secondary | ICD-10-CM | POA: Insufficient documentation

## 2019-10-25 DIAGNOSIS — D3A8 Other benign neuroendocrine tumors: Secondary | ICD-10-CM | POA: Diagnosis not present

## 2019-10-25 DIAGNOSIS — I10 Essential (primary) hypertension: Secondary | ICD-10-CM | POA: Insufficient documentation

## 2019-10-25 DIAGNOSIS — F419 Anxiety disorder, unspecified: Secondary | ICD-10-CM | POA: Diagnosis not present

## 2019-10-25 DIAGNOSIS — Z79899 Other long term (current) drug therapy: Secondary | ICD-10-CM | POA: Diagnosis not present

## 2019-10-25 DIAGNOSIS — D509 Iron deficiency anemia, unspecified: Secondary | ICD-10-CM | POA: Diagnosis not present

## 2019-10-25 DIAGNOSIS — R918 Other nonspecific abnormal finding of lung field: Secondary | ICD-10-CM | POA: Diagnosis not present

## 2019-10-25 DIAGNOSIS — F17210 Nicotine dependence, cigarettes, uncomplicated: Secondary | ICD-10-CM | POA: Diagnosis not present

## 2019-10-25 DIAGNOSIS — M4804 Spinal stenosis, thoracic region: Secondary | ICD-10-CM | POA: Diagnosis not present

## 2019-10-25 DIAGNOSIS — R222 Localized swelling, mass and lump, trunk: Secondary | ICD-10-CM | POA: Diagnosis present

## 2019-10-25 LAB — CBC WITH DIFFERENTIAL (CANCER CENTER ONLY)
Abs Immature Granulocytes: 0.01 10*3/uL (ref 0.00–0.07)
Basophils Absolute: 0 10*3/uL (ref 0.0–0.1)
Basophils Relative: 0 %
Eosinophils Absolute: 0.3 10*3/uL (ref 0.0–0.5)
Eosinophils Relative: 3 %
HCT: 41.6 % (ref 36.0–46.0)
Hemoglobin: 13.3 g/dL (ref 12.0–15.0)
Immature Granulocytes: 0 %
Lymphocytes Relative: 53 %
Lymphs Abs: 4.2 10*3/uL — ABNORMAL HIGH (ref 0.7–4.0)
MCH: 26.3 pg (ref 26.0–34.0)
MCHC: 32 g/dL (ref 30.0–36.0)
MCV: 82.2 fL (ref 80.0–100.0)
Monocytes Absolute: 0.7 10*3/uL (ref 0.1–1.0)
Monocytes Relative: 9 %
Neutro Abs: 2.8 10*3/uL (ref 1.7–7.7)
Neutrophils Relative %: 35 %
Platelet Count: 254 10*3/uL (ref 150–400)
RBC: 5.06 MIL/uL (ref 3.87–5.11)
RDW: 14.4 % (ref 11.5–15.5)
WBC Count: 8.1 10*3/uL (ref 4.0–10.5)
nRBC: 0 % (ref 0.0–0.2)

## 2019-10-25 LAB — CMP (CANCER CENTER ONLY)
ALT: 17 U/L (ref 0–44)
AST: 18 U/L (ref 15–41)
Albumin: 3.9 g/dL (ref 3.5–5.0)
Alkaline Phosphatase: 105 U/L (ref 38–126)
Anion gap: 13 (ref 5–15)
BUN: 11 mg/dL (ref 6–20)
CO2: 24 mmol/L (ref 22–32)
Calcium: 9.2 mg/dL (ref 8.9–10.3)
Chloride: 106 mmol/L (ref 98–111)
Creatinine: 0.74 mg/dL (ref 0.44–1.00)
GFR, Est AFR Am: >60 mL/min (ref >60)
GFR, Estimated: >60 mL/min (ref >60)
Glucose, Bld: 106 mg/dL — ABNORMAL HIGH (ref 70–99)
Potassium: 4 mmol/L (ref 3.5–5.1)
Sodium: 143 mmol/L (ref 135–145)
Total Bilirubin: 0.3 mg/dL (ref 0.3–1.2)
Total Protein: 7.8 g/dL (ref 6.5–8.1)

## 2019-10-25 LAB — IRON AND TIBC
Iron: 50 ug/dL (ref 41–142)
Saturation Ratios: 14 % — ABNORMAL LOW (ref 21–57)
TIBC: 358 ug/dL (ref 236–444)
UIBC: 308 ug/dL (ref 120–384)

## 2019-10-25 LAB — RETICULOCYTES
Immature Retic Fract: 9.2 % (ref 2.3–15.9)
RBC.: 5.08 MIL/uL (ref 3.87–5.11)
Retic Count, Absolute: 72.1 10*3/uL (ref 19.0–186.0)
Retic Ct Pct: 1.4 % (ref 0.4–3.1)

## 2019-10-25 LAB — FERRITIN: Ferritin: 58 ng/mL (ref 11–307)

## 2019-10-25 LAB — LACTATE DEHYDROGENASE: LDH: 156 U/L (ref 98–192)

## 2019-10-25 LAB — SAVE SMEAR(SSMR), FOR PROVIDER SLIDE REVIEW

## 2019-10-25 NOTE — Progress Notes (Signed)
[place] [phone]   Fax:([phone]) [place] NOTE  Patient Care Team: Biagio Borg, MD as PCP - General  Hematological/Oncological History # Metastatic Disease, Workup Underway 1) 08/26/2019: CT Abdomen Pelvis performed due to RUQ abdominal pain. Imaging showed a soft tissue mass abutting the right posterior eighth rib 2) 09/06/2019: CT chest performed which showed Ill-defined soft tissue nodule along the medial right posterior thoracic wall at the 8th intercostal space to the right of the spine. MRI was recommended. 3) 10/19/2019: MRI Thoracic spine showed enhancing lesions throughout the visualized spine consistent with metastatic disease. Additionally there was a 1.5 cm soft tissue nodule in the posteromedial right eighth intercostal space, more concerning for a metastasis 4) 10/25/2019: establish care with Dr. Lorenso Courier    #Low Grade Neuroendocrine Tumor 1) 2006: reportedly had rectal carcinoid tumor removed 2) 08/14/2008: patient had a flexibile sigmoidoscopy with endoscopic ultrasound which resected an 108mm, subepithelial lesion in rectum. Findings consistent with low grade neuroendocrine tumor. 3) 04/08/2010: repeat colonoscopy, no residual tumor. Repeat recommended in 2016.   CHIEF COMPLAINTS/PURPOSE OF CONSULTATION:  "Metastatic Disease, Workup Underway "  HISTORY OF PRESENTING ILLNESS:  Michelle Solomon 59 y.o. female with medical history significant for asthma, iron deficiency anemia, and benign carcinoid tumor of the rectum who presents for evaluation of CT and MRI findings concerning for metastatic disease.  On review of the previous records Michelle Solomon initially presented to the emergency department on 08/26/2019 with 8 out of 10 intensity right upper quadrant abdominal pain.  During that evaluation she underwent a CT scan of the abdomen which revealed a soft tissue mass abutting the right posterior eighth rib.  For further evaluation on  09/06/2019 the patient underwent a CT scan of the chest which showed an ill-defined soft tissue nodule along the medial right posterior thoracic wall at the eighth intercostal space.  In order to better assess this an MRI was recommended.  On 10/19/2019 and a MRI thoracic spine was performed and showed enhancing lesions throughout the visualized spine consistent with metastatic disease.  Additionally there was revisualization of the 1.5 cm soft tissue nodule in the right eighth intercostal space.  Due to concern for what appears to be metastatic disease the patient was referred to oncology for further evaluation and management.   On further review of the charts the patient does have a history of a low-grade neuroendocrine tumor of the rectum.  According to prior records this was resected in 2006.  Records in our system pick up in February 2010 at which time the patient had a flexible sigmoidoscopy with endoscopic ultrasound which resected an 8 mm subepithelial lesion of the rectum.  The pathology findings of this were consistent with a low-grade neuroendocrine tumor.  The patient underwent repeat colonoscopy approximately 1 year later in 2011 and no residual tumor was noted.  A follow-up colonoscopy was recommended in 2016, though does not appear that this occurred.  On exam today Michelle Solomon notes that she feels well.  She notes that she has been having pain on and off in her right lower back which started this whole process.  She notes that this pain was what led her to the emergency department in February 2021 which resulted in the imaging which showed the right rib lesion.  The patient notes that she is up-to-date on her cancer screenings and has undergone mammography most recently 1 month ago.  She notes that she does have some density issues of the  breast but was told otherwise there was no sign of malignancy.  She unfortunately has not had a colonoscopy and the details that are listed above.  Patient does  endorse having some hot flashes and occasional sweating, but denies having any fevers, chills, nausea, vomiting, or diarrhea.  She notes that she has not had any other overt signs of bleeding.  She is an active smoker and currently smoking approximately 1 to 2 cigarettes/day, though she used to smoke heavier and is now in the process of cutting back.  A full 10 point ROS is listed below.  MEDICAL HISTORY:  Past Medical History:  Diagnosis Date  . Acute bronchitis 06/06/2010  . ALLERGIC RHINITIS 05/29/2007  . Anal or rectal pain 06/23/2008  . ANEMIA-IRON DEFICIENCY 05/29/2007  . Anxiety   . ASTHMA 05/29/2007  . 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 Colon polyp   . CONJUNCTIVITIS, ALLERGIC 11/27/2008  . HEMORRHOIDS 06/16/2008  . HTN (hypertension)   . NIPPLE DISCHARGE 01/21/2010  . Other specified forms of hearing loss 10/08/2009  . Overweight(278.02) 05/29/2007  . RASH-NONVESICULAR 05/29/2007  . Wheezing 06/22/2010    SURGICAL HISTORY: Past Surgical History:  Procedure Laterality Date  . NO PAST SURGERIES      SOCIAL HISTORY: Social History   Socioeconomic History  . Marital status: Married    Spouse name: Not on file  . Number of children: 3  . Years of education: Not on file  . Highest education level: Not on file  Occupational History  . Occupation: CARD REPLACEMEN    Employer: [employer]  . Occupation: customer service  Tobacco Use  . Smoking status: Current Every Day Smoker    Packs/day: 0.50    Years: 40.00    Pack years: 20.00    Types: Cigarettes  . Smokeless tobacco: Never Used  . Tobacco comment: 1 pack weekly.  started smoking at 59 years old  Substance and Sexual Activity  . Alcohol use: Yes    Comment: ocassional  . Drug use: No  . Sexual activity: Not on file  Other Topics Concern  . Not on file  Social History Narrative   Daily caffeine use 3   Patient does not get regular exercise   Social Determinants of Health   Financial  Resource Strain:   . Difficulty of Paying Living Expenses:   Food Insecurity:   . Worried About Charity fundraiser in the Last Year:   . Arboriculturist in the Last Year:   Transportation Needs:   . Film/video editor (Medical):   [name] Kitchen Lack of Transportation (Non-Medical):   Physical Activity:   . Days of Exercise per Week:   . Minutes of Exercise per Session:   Stress:   . Feeling of Stress :   Social Connections:   . Frequency of Communication with Friends and Family:   . Frequency of Social Gatherings with Friends and Family:   . Attends Religious Services:   . Active Member of Clubs or Organizations:   . Attends Archivist Meetings:   [name] Kitchen Marital Status:   Intimate Partner Violence:   . Fear of Current or Ex-Partner:   . Emotionally Abused:   [name] Kitchen Physically Abused:   . Sexually Abused:     FAMILY HISTORY: Family History  Problem Relation Age of Onset  . Allergies Sister   . Diabetes Sister   . Hypertension Sister   . Hypertension Mother   . Prostate cancer Father   .  Hypertension Brother   . Hypertension Sister   . Diabetes Paternal Aunt   . Cancer Paternal Aunt        type unknown  . Prostate cancer Brother   . Stroke Maternal Aunt   . Hypertension Maternal Aunt     ALLERGIES:  has No Known Allergies.  MEDICATIONS:  Current Outpatient Medications  Medication Sig Dispense Refill  . ALPRAZolam (XANAX) 0.5 MG tablet Take 1 tablet (0.5 mg total) by mouth 2 (two) times daily as needed for anxiety or sleep. 60 tablet 2  . buPROPion (WELLBUTRIN XL) 300 MG 24 hr tablet Take 1 tablet (300 mg total) by mouth daily. 90 tablet 0  . escitalopram (LEXAPRO) 10 MG tablet Take 1 tablet (10 mg total) by mouth daily. 90 tablet 3  . hydrochlorothiazide (HYDRODIURIL) 12.5 MG tablet Take 1 tablet (12.5 mg total) by mouth daily. 90 tablet 0  . hydrocortisone 2.5 % cream Apply topically 2 (two) times daily. 30 g 1  . iron polysaccharides (NU-IRON) 150 MG capsule Take 1  capsule (150 mg total) by mouth daily. (Patient not taking: Reported on 10/25/2019) 90 capsule 1  . loratadine (CLARITIN) 10 MG tablet Take 10 mg by mouth daily as needed for allergies.    [name] Kitchen oxyCODONE-acetaminophen (PERCOCET/ROXICET) 5-325 MG tablet Take 1 tablet by mouth every 6 (six) hours as needed for severe pain. 30 tablet 0  . vitamin B-12 (CYANOCOBALAMIN) 100 MCG tablet Take 100 mcg by mouth daily.    [name] Kitchen zolpidem (AMBIEN) 10 MG tablet Take 1 tablet (10 mg total) by mouth at bedtime as needed for up to 30 days for sleep. 30 tablet 5   No current facility-administered medications for this visit.    REVIEW OF SYSTEMS:   Constitutional: ( - ) fevers, ( - )  chills , ( - ) night sweats Eyes: ( - ) blurriness of vision, ( - ) double vision, ( - ) watery eyes Ears, nose, mouth, throat, and face: ( - ) mucositis, ( - ) sore throat Respiratory: ( - ) cough, ( - ) dyspnea, ( - ) wheezes Cardiovascular: ( - ) palpitation, ( - ) chest discomfort, ( - ) lower extremity swelling Gastrointestinal:  ( - ) nausea, ( - ) heartburn, ( - ) change in bowel habits Skin: ( - ) abnormal skin rashes Lymphatics: ( - ) new lymphadenopathy, ( - ) easy bruising Neurological: ( - ) numbness, ( - ) tingling, ( - ) new weaknesses Behavioral/Psych: ( - ) mood change, ( - ) new changes  All other systems were reviewed with the patient and are negative.  PHYSICAL EXAMINATION: ECOG PERFORMANCE STATUS: 1 - Symptomatic but completely ambulatory  Vitals:   10/25/19 0812  BP: (!) 153/104  Pulse: 82  Resp: 18  Temp: 98.9 F (37.2 C)  SpO2: 100%   Filed Weights   10/25/19 0812  Weight: 193 lb 14.4 oz (88 kg)    GENERAL: well appearing middle aged [place] American female in NAD  SKIN: skin color, texture, turgor are normal, no rashes or significant lesions EYES: conjunctiva are pink and non-injected, sclera clear LUNGS: clear to auscultation and percussion with normal breathing effort HEART: regular rate &  rhythm and no murmurs and no lower extremity edema ABDOMEN: soft, non-tender, non-distended, normal bowel sounds. No HSM.  Musculoskeletal: no cyanosis of digits and no clubbing  PSYCH: alert & oriented x 3, fluent speech NEURO: no focal motor/sensory deficits  LABORATORY DATA:  I have reviewed  the data as listed CBC Latest Ref Rng & Units 10/25/2019 08/28/2019 08/26/2019  WBC 4.0 - 10.5 K/uL 8.1 7.6 8.3  Hemoglobin 12.0 - 15.0 g/dL 13.3 12.9 13.7  Hematocrit 36.0 - 46.0 % 41.6 39.7 42.9  Platelets 150 - 400 K/uL 254 260.0 291    CMP Latest Ref Rng & Units 10/25/2019 08/28/2019 08/26/2019  Glucose 70 - 99 mg/dL 106(H) 97 95  BUN 6 - 20 mg/dL 11 12 9   Creatinine 0.44 - 1.00 mg/dL 0.74 0.71 0.84  Sodium 135 - 145 mmol/L 143 140 137  Potassium 3.5 - 5.1 mmol/L 4.0 3.7 4.0  Chloride 98 - 111 mmol/L 106 104 98  CO2 22 - 32 mmol/L 24 29 30   Calcium 8.9 - 10.3 mg/dL 9.2 9.6 10.3  Total Protein 6.5 - 8.1 g/dL 7.8 7.5 7.2  Total Bilirubin 0.3 - 1.2 mg/dL 0.3 0.3 0.4  Alkaline Phos 38 - 126 U/L 105 92 89  AST 15 - 41 U/L 18 22 25   ALT 0 - 44 U/L 17 21 25     PATHOLOGY: Biopsy to be performed.   RADIOGRAPHIC STUDIES: I have personally reviewed the radiological images as listed and agreed with the findings in the report: lesion noted in the right 8th intercostal space.   MR THORACIC SPINE W WO CONTRAST  Result Date: 10/19/2019 CLINICAL DATA:  Right posterior chest wall soft tissue mass. History of low-grade neuroendocrine tumor. EXAM: MRI THORACIC WITHOUT AND WITH CONTRAST TECHNIQUE: Multiplanar and multiecho pulse sequences of the thoracic spine were obtained without and with intravenous contrast. CONTRAST:  8.96mL GADAVIST GADOBUTROL 1 MMOL/ML IV SOLN COMPARISON:  Chest CT 09/06/2019 FINDINGS: Some sequences are up to moderately motion degraded. Alignment: Normal. Vertebrae: Numerous T1 hypointense, stir hyperintense, enhancing lesions throughout the vertebral bodies and posterior elements of  the thoracic spine with lesions also partially visualized in the lower cervical and upper lumbar spine. No evidence of pathologic fracture or epidural tumor. Cord:  Normal signal and morphology. Paraspinal and other soft tissues: 1.5 cm enhancing soft tissue nodule in the posteromedial right eighth intercostal space as seen on prior CT. Disc levels: Mild spinal stenosis at T9-10 due to ligamentum flavum hypertrophy and ossification, greater on the right. No sizable thoracic disc herniation. IMPRESSION: 1. Enhancing lesions throughout the visualized spine consistent with metastatic disease. No evidence of epidural tumor. 2. 1.5 cm soft tissue nodule in the posteromedial right eighth intercostal space, more concerning for a metastasis (as opposed to peripheral nerve sheath tumor) in light of #1. Electronically Signed   By: Logan Bores M.D.   On: 10/19/2019 09:17    ASSESSMENT & PLAN Michelle Solomon 59 y.o. female with medical history significant for asthma, iron deficiency anemia, and benign carcinoid tumor of the rectum who presents for evaluation of CT and MRI findings concerning for metastatic disease.  After review the labs and discussion with the patient the cause of her CT and MRI findings concerning for metastatic disease is unclear.  The pattern does not represent that of any specific tumor type, and the CT scan shows no solid organ involvement.  As such I think it would be best at this time to perform a biopsy of the known 1.5 cm intercostal lesion to help guide our further work-up and evaluation.  It is possible that this is not a malignant condition, though it is worrisome that the MRI findings are consistent with metastatic disease.  Today I discussed with the patient that it is unclear to [place]  what type of tumor we are dealing with or if this is a malignancy at all.  I noted that the best way to evaluate this further would be with a biopsy and the patient was in agreement.  We will plan to see the  patient back after the biopsy has been performed.  # Metastatic Disease, Workup Underway --will refer to IR for consideration of biopsy of the 1.5 cm soft tissue nodule in the posteromedial right eighth intercostal space --no clear indication for further imaging at this time, though this can be guided by the results of the biopsy --pattern does not likely represent spread of her previously diagnosed neuroendocrine tumor as it was localized and low grade. Additionally these tend not to metastasize to bone.  --patient reports mammogram is up to date, but colonoscopy is currently overdue  --RTC placeholder scheduled in 3 weeks, presumably with results of biopsy to discuss.   Orders Placed This Encounter  Procedures  . CT GUIDED NEEDLE PLACEMENT    Standing Status:   Future    Standing Expiration Date:   01/23/2021    Order Specific Question:   If indicated for the ordered procedure, I authorize the administration of contrast media per Radiology protocol    Answer:   Yes    Order Specific Question:   Reason for Exam (SYMPTOM  OR DIAGNOSIS REQUIRED)    Answer:   1.5 cm lesion in 8th right intercostal space    Order Specific Question:   Is patient pregnant?    Answer:   No    Order Specific Question:   Preferred imaging location?    Answer:   [facility]    Order Specific Question:   Radiology Contrast Protocol - do NOT remove file path    Answer:   [email or web address]  . CBC with Differential ([place] Only)    Standing Status:   Future    Number of Occurrences:   1    Standing Expiration Date:   10/24/2020  . Save Smear (SSMR)    Standing Status:   Future    Number of Occurrences:   1    Standing Expiration Date:   10/24/2020  . CMP ([place] only)    Standing Status:   Future    Number of Occurrences:   1    Standing Expiration Date:   10/24/2020  . Lactate dehydrogenase (LDH)    Standing Status:   Future    Number of Occurrences:   1     Standing Expiration Date:   10/24/2020  . Iron and TIBC    Standing Status:   Future    Number of Occurrences:   1    Standing Expiration Date:   10/24/2020  . Ferritin    Standing Status:   Future    Number of Occurrences:   1    Standing Expiration Date:   10/24/2020  . Reticulocytes    Standing Status:   Future    Number of Occurrences:   1    Standing Expiration Date:   10/24/2020  . Ambulatory referral to Interventional Radiology    Referral Priority:   Routine    Referral Type:   Consultation    Referral Reason:   Specialty Services Required    Requested Specialty:   Interventional Radiology    Number of Visits Requested:   1   All questions were answered. The patient knows to call the clinic with any problems, questions or concerns.  A total of more than 60 minutes were spent on this encounter and over half of that time was spent on counseling and coordination of care as outlined above.   [name], MD Department of Hematology/Oncology [place] [facility] Phone: [phone] Pager: [phone] Email: [email or web address]  10/25/2019 3:40 PM

## 2019-10-28 ENCOUNTER — Telehealth: Payer: Self-pay | Admitting: Hematology and Oncology

## 2019-10-28 NOTE — Telephone Encounter (Signed)
Scheduled per los. Called and spoke with patient. Confirmed appt 

## 2019-10-29 ENCOUNTER — Encounter (HOSPITAL_COMMUNITY): Payer: Self-pay

## 2019-10-29 NOTE — Progress Notes (Unsigned)
Michelle Solomon, 59 y.o., Jul 25, 1960 MRN:  [id number] Phone:  [phone] (M) PCP:  Michelle Borg, MD Coverage:  [facility] Other Next Appt With Radiology ([facility]-CT 3) 11/05/2019 at 8:00 AM  RE: Biopsy Received: Yesterday Message Contents  Michelle Daft, MD  Michelle Solomon for CT guided biopsy of right intercostal lesion between 8th and 9th ribs.   Michelle Solomon   Previous Messages  ----- Message -----  From: Michelle Solomon  Sent: 10/25/2019  4:36 PM EDT  To: Ir Procedure Requests  Subject: Biopsy                      Procedure Requested: CT Guided Needle Placement    Reason for Procedure: 1.5 cm lesion in 8th right intercostal space

## 2019-11-02 ENCOUNTER — Other Ambulatory Visit (HOSPITAL_COMMUNITY)
Admission: RE | Admit: 2019-11-02 | Discharge: 2019-11-02 | Disposition: A | Discharge status: Home / Self Care | Payer: 59 | Source: Ambulatory Visit | Attending: Hematology and Oncology | Admitting: Hematology and Oncology

## 2019-11-02 DIAGNOSIS — Z79899 Other long term (current) drug therapy: Secondary | ICD-10-CM | POA: Diagnosis not present

## 2019-11-02 DIAGNOSIS — Z87891 Personal history of nicotine dependence: Secondary | ICD-10-CM | POA: Diagnosis not present

## 2019-11-02 DIAGNOSIS — Z8042 Family history of malignant neoplasm of prostate: Secondary | ICD-10-CM | POA: Diagnosis not present

## 2019-11-02 DIAGNOSIS — Z86010 Personal history of colonic polyps: Secondary | ICD-10-CM

## 2019-11-02 DIAGNOSIS — Z5309 Procedure and treatment not carried out because of other contraindication: Secondary | ICD-10-CM | POA: Diagnosis not present

## 2019-11-02 DIAGNOSIS — Z860101 Personal history of adenomatous and serrated colon polyps: Secondary | ICD-10-CM

## 2019-11-02 DIAGNOSIS — Z01818 Encounter for other preprocedural examination: Secondary | ICD-10-CM | POA: Diagnosis present

## 2019-11-02 DIAGNOSIS — Z20822 Contact with and (suspected) exposure to covid-19: Secondary | ICD-10-CM | POA: Diagnosis not present

## 2019-11-02 DIAGNOSIS — R918 Other nonspecific abnormal finding of lung field: Secondary | ICD-10-CM | POA: Diagnosis not present

## 2019-11-02 DIAGNOSIS — Z8249 Family history of ischemic heart disease and other diseases of the circulatory system: Secondary | ICD-10-CM | POA: Diagnosis not present

## 2019-11-02 DIAGNOSIS — Z833 Family history of diabetes mellitus: Secondary | ICD-10-CM | POA: Diagnosis not present

## 2019-11-02 HISTORY — DX: Personal history of adenomatous and serrated colon polyps: Z86.0101

## 2019-11-02 HISTORY — DX: Personal history of colonic polyps: Z86.010

## 2019-11-02 LAB — SARS CORONAVIRUS 2 (TAT 6-24 HRS): SARS Coronavirus 2: NEGATIVE

## 2019-11-04 ENCOUNTER — Other Ambulatory Visit: Payer: Self-pay | Admitting: Student

## 2019-11-04 ENCOUNTER — Ambulatory Visit: Payer: Self-pay

## 2019-11-05 ENCOUNTER — Other Ambulatory Visit: Payer: Self-pay | Admitting: Hematology and Oncology

## 2019-11-05 ENCOUNTER — Telehealth: Payer: Self-pay | Admitting: Hematology and Oncology

## 2019-11-05 ENCOUNTER — Other Ambulatory Visit: Payer: Self-pay

## 2019-11-05 ENCOUNTER — Ambulatory Visit (HOSPITAL_COMMUNITY)
Admission: RE | Admit: 2019-11-05 | Discharge: 2019-11-05 | Disposition: A | Discharge status: Home / Self Care | Payer: 59 | Source: Ambulatory Visit | Attending: Hematology and Oncology | Admitting: Hematology and Oncology

## 2019-11-05 ENCOUNTER — Encounter (HOSPITAL_COMMUNITY): Payer: Self-pay

## 2019-11-05 DIAGNOSIS — R918 Other nonspecific abnormal finding of lung field: Secondary | ICD-10-CM

## 2019-11-05 DIAGNOSIS — Z5309 Procedure and treatment not carried out because of other contraindication: Secondary | ICD-10-CM | POA: Insufficient documentation

## 2019-11-05 DIAGNOSIS — Z01818 Encounter for other preprocedural examination: Secondary | ICD-10-CM | POA: Insufficient documentation

## 2019-11-05 DIAGNOSIS — Z8042 Family history of malignant neoplasm of prostate: Secondary | ICD-10-CM | POA: Insufficient documentation

## 2019-11-05 DIAGNOSIS — Z20822 Contact with and (suspected) exposure to covid-19: Secondary | ICD-10-CM | POA: Insufficient documentation

## 2019-11-05 DIAGNOSIS — Z87891 Personal history of nicotine dependence: Secondary | ICD-10-CM | POA: Insufficient documentation

## 2019-11-05 DIAGNOSIS — Z79899 Other long term (current) drug therapy: Secondary | ICD-10-CM | POA: Insufficient documentation

## 2019-11-05 DIAGNOSIS — Z8249 Family history of ischemic heart disease and other diseases of the circulatory system: Secondary | ICD-10-CM | POA: Insufficient documentation

## 2019-11-05 DIAGNOSIS — Z833 Family history of diabetes mellitus: Secondary | ICD-10-CM | POA: Insufficient documentation

## 2019-11-05 MED ORDER — MIDAZOLAM HCL 2 MG/2ML IJ SOLN
INTRAMUSCULAR | Status: AC
  Filled 2019-11-05: qty 4

## 2019-11-05 MED ORDER — LIDOCAINE HCL 1 % IJ SOLN
INTRAMUSCULAR | Status: AC
  Filled 2019-11-05: qty 20

## 2019-11-05 MED ORDER — FENTANYL CITRATE (PF) 100 MCG/2ML IJ SOLN
INTRAMUSCULAR | Status: AC
  Filled 2019-11-05: qty 4

## 2019-11-05 MED ORDER — SODIUM CHLORIDE 0.9 % IV SOLN: INTRAVENOUS | Status: DC

## 2019-11-05 NOTE — Discharge Instructions (Addendum)
Needle Biopsy, Care After These instructions tell you how to care for yourself after your procedure. Your doctor may also give you more specific instructions. Call your doctor if you have any problems or questions. What can I expect after the procedure? After the procedure, it is common to have:  Soreness.  Bruising.  Mild pain. Follow these instructions at home:   Return to your normal activities as told by your doctor. Ask your doctor what activities are safe for you.  Take over-the-counter and prescription medicines only as told by your doctor.  Wash your hands with soap and water before you change your bandage (dressing). If you cannot use soap and water, use hand sanitizer.  Follow instructions from your doctor about: ? How to take care of your puncture site. ? When and how to change your bandage. ? When to remove your bandage.  Check your puncture site every day for signs of infection. Watch for: ? Redness, swelling, or pain. ? Fluid or blood. ? Pus or a bad smell. ? Warmth.  Do not take baths, swim, or use a hot tub until your doctor approves. Ask your doctor if you may take showers. You may only be allowed to take sponge baths.  Keep all follow-up visits as told by your doctor. This is important. Contact a doctor if you have:  A fever.  Redness, swelling, or pain at the puncture site, and it lasts longer than a few days.  Fluid, blood, or pus coming from the puncture site.  Warmth coming from the puncture site. Get help right away if:  You have a lot of bleeding from the puncture site. Summary  After the procedure, it is common to have soreness, bruising, or mild pain at the puncture site.  Check your puncture site every day for signs of infection, such as redness, swelling, or pain.  Get help right away if you have severe bleeding from your puncture site. This information is not intended to replace advice given to you by your health care provider. Make  sure you discuss any questions you have with your health care provider. Document Revised: 07/03/2017 Document Reviewed: 07/03/2017 Elsevier Patient Education  2020 Elsevier Inc. Moderate Conscious Sedation, Adult Sedation is the use of medicines to promote relaxation and relieve discomfort and anxiety. Moderate conscious sedation is a type of sedation. Under moderate conscious sedation, you are less alert than normal, but you are still able to respond to instructions, touch, or both. Moderate conscious sedation is used during short medical and dental procedures. It is milder than deep sedation, which is a type of sedation under which you cannot be easily woken up. It is also milder than general anesthesia, which is the use of medicines to make you unconscious. Moderate conscious sedation allows you to return to your regular activities sooner. Tell a health care provider about:  Any allergies you have.  All medicines you are taking, including vitamins, herbs, eye drops, creams, and over-the-counter medicines.  Use of steroids (by mouth or creams).  Any problems you or family members have had with sedatives and anesthetic medicines.  Any blood disorders you have.  Any surgeries you have had.  Any medical conditions you have, such as sleep apnea.  Whether you are pregnant or may be pregnant.  Any use of cigarettes, alcohol, marijuana, or street drugs. What are the risks? Generally, this is a safe procedure. However, problems may occur, including:  Getting too much medicine (oversedation).  Nausea.  Allergic reaction to   medicines.  Trouble breathing. If this happens, a breathing tube may be used to help with breathing. It will be removed when you are awake and breathing on your own.  Heart trouble.  Lung trouble. What happens before the procedure? Staying hydrated Follow instructions from your health care provider about hydration, which may include:  Up to 2 hours before the  procedure - you may continue to drink clear liquids, such as water, clear fruit juice, black coffee, and plain tea. Eating and drinking restrictions Follow instructions from your health care provider about eating and drinking, which may include:  8 hours before the procedure - stop eating heavy meals or foods such as meat, fried foods, or fatty foods.  6 hours before the procedure - stop eating light meals or foods, such as toast or cereal.  6 hours before the procedure - stop drinking milk or drinks that contain milk.  2 hours before the procedure - stop drinking clear liquids. Medicine Ask your health care provider about:  Changing or stopping your regular medicines. This is especially important if you are taking diabetes medicines or blood thinners.  Taking medicines such as aspirin and ibuprofen. These medicines can thin your blood. Do not take these medicines before your procedure if your health care provider instructs you not to.  Tests and exams  You will have a physical exam.  You may have blood tests done to show: ? How well your kidneys and liver are working. ? How well your blood can clot. General instructions  Plan to have someone take you home from the hospital or clinic.  If you will be going home right after the procedure, plan to have someone with you for 24 hours. What happens during the procedure?  An IV tube will be inserted into one of your veins.  Medicine to help you relax (sedative) will be given through the IV tube.  The medical or dental procedure will be performed. What happens after the procedure?  Your blood pressure, heart rate, breathing rate, and blood oxygen level will be monitored often until the medicines you were given have worn off.  Do not drive for 24 hours. This information is not intended to replace advice given to you by your health care provider. Make sure you discuss any questions you have with your health care provider. Document  Revised: 06/02/2017 Document Reviewed: 10/10/2015 Elsevier Patient Education  2020 Elsevier Inc.  

## 2019-11-05 NOTE — Telephone Encounter (Signed)
Called Ms. Gombert and to discuss the findings on CT-guided biopsy earlier today.  The radiologist noted that the lesion which we requested a biopsy of had subsequently subsided and there was no lesion to biopsy.  This is very reassuring that the lesion in question was not malignancy.  After discussion with the radiologist and the patient, the patient noted that she wished to undergo a PET CT scan in order to assure that none of the other lesions noted on prior CT imaging was PET avid.  I will place an order for a PET CT scan to be performed.  Additionally we could consider interval imaging in the next 2 to 3 months with a repeat CT scan, however the patient was very insistent that a PET CT scan would be her preference in order to firmly rule out metastatic disease to the spine.  I think this is a reasonable request and we will attempt to make these arrangements soon as possible.  [name], MD Department of Hematology/Oncology [place] [facility] Phone: [phone] Pager: [phone] Email: [email or web address]

## 2019-11-05 NOTE — H&P (Signed)
Chief Complaint: Patient was seen in consultation today for 8th Rt intercostal lesion biopsy at the request of [place] T IV  Referring Physician(s): Dorsey,John T IV  Supervising Physician: Daryll Brod  [facility]  History of Present Illness: Michelle Solomon is a 59 y.o. female   Developed abd pain 2 mo ago Work up included CT 1) 08/26/2019: CT Abdomen Pelvis performed due to RUQ abdominal pain. Imaging showed a soft tissue mass abutting the right posterior eighth rib 2) 09/06/2019: CT chest performed which showed Ill-defined soft tissue nodule along the medial right posterior thoracic wall at the 8th intercostal space to the right of the spine. MRI was recommended. 3) 10/19/2019: MRI Thoracic spine showed enhancing lesions throughout the visualized spine consistent with metastatic disease. Additionally there was a 1.5 cm soft tissue nodule in the posteromedial right eighth intercostal space, more concerning for a metastasis  Referred to Dr Lorenso Courier Oncologist Request now for biopsy of Rt 8th  Intercostal lesion biopsy  Pertinent Hx:  #Low Grade Neuroendocrine Tumor 1) 2006: reportedly had rectal carcinoid tumor removed 2) 08/14/2008: patient had a flexibile sigmoidoscopy with endoscopic ultrasound which resected an 48mm, subepithelial lesion in rectum. Findings consistent with low grade neuroendocrine tumor. 3) 04/08/2010: repeat colonoscopy, no residual tumor. Repeat recommended in 2016.  Has not had follow up colonoscopy as of yet Scheduled for later this month   Past Medical History:  Diagnosis Date  . Acute bronchitis 06/06/2010  . ALLERGIC RHINITIS 05/29/2007  . Anal or rectal pain 06/23/2008  . ANEMIA-IRON DEFICIENCY 05/29/2007  . Anxiety   . ASTHMA 05/29/2007  . 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 Colon polyp   . CONJUNCTIVITIS, ALLERGIC 11/27/2008  . HEMORRHOIDS 06/16/2008  . HTN (hypertension)   . NIPPLE DISCHARGE 01/21/2010  .  Other specified forms of hearing loss 10/08/2009  . Overweight(278.02) 05/29/2007  . RASH-NONVESICULAR 05/29/2007  . Wheezing 06/22/2010    Past Surgical History:  Procedure Laterality Date  . NO PAST SURGERIES      Allergies: Patient has no known allergies.  Medications: Prior to Admission medications   Medication Sig Start Date End Date Taking? Authorizing Provider  buPROPion (WELLBUTRIN XL) 300 MG 24 hr tablet Take 1 tablet (300 mg total) by mouth daily. 10/24/19  Yes Biagio Borg, MD  cholecalciferol (VITAMIN D3) 25 MCG (1000 UNIT) tablet Take 1,000 Units by mouth daily.   Yes [provider]  escitalopram (LEXAPRO) 20 MG tablet Take 20 mg by mouth daily.   Yes [provider]  hydrochlorothiazide (HYDRODIURIL) 12.5 MG tablet Take 1 tablet (12.5 mg total) by mouth daily. 05/27/19  Yes Biagio Borg, MD  loratadine (CLARITIN) 10 MG tablet Take 10 mg by mouth daily as needed for allergies.   Yes [provider]  Turmeric 500 MG CAPS Take 500 mg by mouth daily.   Yes [provider]  TURMERIC PO Take 1 capsule by mouth at bedtime. Turmeric Supreme   Yes [provider]  vitamin B-12 (CYANOCOBALAMIN) 100 MCG tablet Take 100 mcg by mouth daily.   Yes [provider]  VITAMIN D-VITAMIN K PO Take 1 tablet by mouth daily.   Yes [provider]  zolpidem (AMBIEN) 10 MG tablet Take 1 tablet (10 mg total) by mouth at bedtime as needed for up to 30 days for sleep. 08/01/18 11/01/19 Yes Biagio Borg, MD  ALPRAZolam Duanne Moron) 0.5 MG tablet Take 1 tablet (0.5 mg total) by mouth  2 (two) times daily as needed for anxiety or sleep. 08/16/18   Biagio Borg, MD  hydrocortisone 2.5 % cream Apply topically 2 (two) times daily. Patient not taking: Reported on 11/01/2019 11/09/17   Biagio Borg, MD  iron polysaccharides (NU-IRON) 150 MG capsule Take 1 capsule (150 mg total) by mouth daily. Patient not taking: Reported on 10/25/2019 08/30/19   Biagio Borg, MD  oxyCODONE-acetaminophen (PERCOCET/ROXICET) 5-325 MG tablet Take 1 tablet by mouth every 6 (six) hours as needed for severe pain. Patient not taking: Reported on 11/01/2019 08/30/19   Biagio Borg, MD     Family History  Problem Relation Age of Onset  . Allergies Sister   . Diabetes Sister   . Hypertension Sister   . Hypertension Mother   . Prostate cancer Father   . Hypertension Brother   . Hypertension Sister   . Diabetes Paternal Aunt   . Cancer Paternal Aunt        type unknown  . Prostate cancer Brother   . Stroke Maternal Aunt   . Hypertension Maternal Aunt     Social History   Socioeconomic History  . Marital status: Married    Spouse name: Not on file  . Number of children: 3  . Years of education: Not on file  . Highest education level: Not on file  Occupational History  . Occupation: CARD REPLACEMEN    Employer: [employer]  . Occupation: customer service  Tobacco Use  . Smoking status: Former Smoker    Packs/day: 0.50    Years: 40.00    Pack years: 20.00    Types: Cigarettes    Quit date: 10/22/2019    Years since quitting: 0.0  . Smokeless tobacco: Never Used  . Tobacco comment: 1 pack weekly.  started smoking at 59 years old  Substance and Sexual Activity  . Alcohol use: Yes    Comment: ocassional  . Drug use: No  . Sexual activity: Not on file  Other Topics Concern  . Not on file  Social History Narrative   Daily caffeine use 3   Patient does not get regular exercise   Social Determinants of Health   Financial Resource Strain:   . Difficulty of Paying Living Expenses:   Food Insecurity:   . Worried About Charity fundraiser in the Last Year:   . Arboriculturist in the Last Year:   Transportation Needs:   . Film/video editor (Medical):   [name] Kitchen Lack of Transportation (Non-Medical):   Physical Activity:   . Days of Exercise per Week:   . Minutes of Exercise per Session:   Stress:   . Feeling of Stress :   Social  Connections:   . Frequency of Communication with Friends and Family:   . Frequency of Social Gatherings with Friends and Family:   . Attends Religious Services:   . Active Member of Clubs or Organizations:   . Attends Archivist Meetings:   [name] Kitchen Marital Status:      Review of Systems: A 12 point ROS discussed and pertinent positives are indicated in the HPI above.  All other systems are negative.  Review of Systems  Constitutional: Negative for activity change, fatigue, fever and unexpected weight change.  Cardiovascular: Negative for chest pain.  Gastrointestinal: Positive for abdominal pain and nausea.  Musculoskeletal: Positive for back pain.  Neurological: Negative for weakness.  Psychiatric/Behavioral: Negative for behavioral problems and confusion.    Vital  Signs: BP (!) 145/97   Pulse 79   Temp 98.1 F (36.7 C) (Oral)   Resp 16   Ht 5\' 3"  (1.6 m)   Wt 190 lb (86.2 kg)   LMP 10/26/2017   SpO2 100%   BMI 33.66 kg/m   Physical Exam Vitals reviewed.  Cardiovascular:     Rate and Rhythm: Normal rate and regular rhythm.     Heart sounds: Normal heart sounds.  Pulmonary:     Effort: Pulmonary effort is normal.     Breath sounds: Normal breath sounds.  Abdominal:     Palpations: Abdomen is soft.  Musculoskeletal:        General: Normal range of motion.  Skin:    General: Skin is warm and dry.  Neurological:     Mental Status: She is alert and oriented to person, place, and time.  Psychiatric:        Mood and Affect: Mood normal.        Behavior: Behavior normal.        Thought Content: Thought content normal.        Judgment: Judgment normal.     Imaging: MR THORACIC SPINE W WO CONTRAST  Result Date: 10/19/2019 CLINICAL DATA:  Right posterior chest wall soft tissue mass. History of low-grade neuroendocrine tumor. EXAM: MRI THORACIC WITHOUT AND WITH CONTRAST TECHNIQUE: Multiplanar and multiecho pulse sequences of the thoracic spine were obtained  without and with intravenous contrast. CONTRAST:  8.72mL GADAVIST GADOBUTROL 1 MMOL/ML IV SOLN COMPARISON:  Chest CT 09/06/2019 FINDINGS: Some sequences are up to moderately motion degraded. Alignment: Normal. Vertebrae: Numerous T1 hypointense, stir hyperintense, enhancing lesions throughout the vertebral bodies and posterior elements of the thoracic spine with lesions also partially visualized in the lower cervical and upper lumbar spine. No evidence of pathologic fracture or epidural tumor. Cord:  Normal signal and morphology. Paraspinal and other soft tissues: 1.5 cm enhancing soft tissue nodule in the posteromedial right eighth intercostal space as seen on prior CT. Disc levels: Mild spinal stenosis at T9-10 due to ligamentum flavum hypertrophy and ossification, greater on the right. No sizable thoracic disc herniation. IMPRESSION: 1. Enhancing lesions throughout the visualized spine consistent with metastatic disease. No evidence of epidural tumor. 2. 1.5 cm soft tissue nodule in the posteromedial right eighth intercostal space, more concerning for a metastasis (as opposed to peripheral nerve sheath tumor) in light of #1. Electronically Signed   By: Logan Bores M.D.   On: 10/19/2019 09:17    Labs:  CBC: Recent Labs    08/26/19 1653 08/28/19 1314 10/25/19 0921  WBC 8.3 7.6 8.1  HGB 13.7 12.9 13.3  HCT 42.9 39.7 41.6  PLT 291 260.0 254    COAGS: No results for input(s): INR, APTT in the last 8760 hours.  BMP: Recent Labs    08/26/19 1653 08/28/19 1314 10/25/19 0921  NA 137 140 143  K 4.0 3.7 4.0  CL 98 104 106  CO2 30 29 24   GLUCOSE 95 97 106*  BUN 9 12 11   CALCIUM 10.3 9.6 9.2  CREATININE 0.84 0.71 0.74  GFRNONAA >60  --  >60  GFRAA >60  --  >60    LIVER FUNCTION TESTS: Recent Labs    08/26/19 1653 08/28/19 1314 10/25/19 0921  BILITOT 0.4 0.3 0.3  AST 25 22 18   ALT 25 21 17   ALKPHOS 89 92 105  PROT 7.2 7.5 7.8  ALBUMIN 3.8 3.9 3.9    TUMOR MARKERS: No results  for input(s): AFPTM, CEA, CA199, CHROMGRNA in the last 8760 hours.  Assessment and Plan:  New abd pain Work up/Imaging revealed 1. Enhancing lesions throughout the visualized spine consistent with metastatic disease. No evidence of epidural tumor. 2. 1.5 cm soft tissue nodule in the posteromedial right eighth intercostal space, more concerning for a metastasis (as opposed to peripheral nerve sheath tumor) in light of #1. Hx rectal neuroendocrine tumor 2006 Now scheduled for Rt 8th intercostal lesion bx  Risks and benefits of Rt 8th intercostal lesion biopsy was discussed with the patient and/or patient's family including, but not limited to bleeding, infection, damage to adjacent structures or low yield requiring additional tests.  All of the questions were answered and there is agreement to proceed. Consent signed and in chart.   Thank you for this interesting consult.  I greatly enjoyed meeting Michelle Solomon and look forward to participating in their care.  A copy of this report was sent to the requesting provider on this date.  Electronically Signed: Lavonia Drafts, PA-C 11/05/2019, 7:24 AM   I spent a total of  30 Minutes   in face to face in clinical consultation, greater than 50% of which was counseling/coordinating care for Rt 8th intercostal lesion bx

## 2019-11-05 NOTE — Sedation Documentation (Signed)
CT biopsy cancelled today per Dr. Annamaria Boots. Dr. Annamaria Boots explained to patient the reasoning behind cancelling procedure. Patient states understanding and has no further questions or concerns for physician. Ordering physician will be updated by Dr. Annamaria Boots. IV removed and patient discharged home.

## 2019-11-07 ENCOUNTER — Inpatient Hospital Stay: Payer: 59 | Admitting: Hematology and Oncology

## 2019-11-07 ENCOUNTER — Inpatient Hospital Stay: Payer: 59

## 2019-11-08 ENCOUNTER — Ambulatory Visit (INDEPENDENT_AMBULATORY_CARE_PROVIDER_SITE_OTHER): Payer: 59

## 2019-11-08 ENCOUNTER — Other Ambulatory Visit: Payer: Self-pay | Admitting: Internal Medicine

## 2019-11-08 DIAGNOSIS — Z1159 Encounter for screening for other viral diseases: Secondary | ICD-10-CM

## 2019-11-09 LAB — SARS CORONAVIRUS 2 (TAT 6-24 HRS): SARS Coronavirus 2: NEGATIVE

## 2019-11-12 ENCOUNTER — Encounter: Payer: Self-pay | Admitting: Internal Medicine

## 2019-11-12 ENCOUNTER — Other Ambulatory Visit: Payer: Self-pay

## 2019-11-12 ENCOUNTER — Ambulatory Visit (AMBULATORY_SURGERY_CENTER): Payer: 59 | Admitting: Internal Medicine

## 2019-11-12 VITALS — BP 121/75 | HR 85 | Temp 97.5°F | Resp 19 | Ht 63.0 in | Wt 193.0 lb

## 2019-11-12 DIAGNOSIS — D122 Benign neoplasm of ascending colon: Secondary | ICD-10-CM

## 2019-11-12 DIAGNOSIS — D128 Benign neoplasm of rectum: Secondary | ICD-10-CM | POA: Diagnosis not present

## 2019-11-12 DIAGNOSIS — Z1211 Encounter for screening for malignant neoplasm of colon: Secondary | ICD-10-CM

## 2019-11-12 DIAGNOSIS — D129 Benign neoplasm of anus and anal canal: Secondary | ICD-10-CM

## 2019-11-12 MED ORDER — SODIUM CHLORIDE 0.9 % IV SOLN
500.0000 mL | Freq: Once | INTRAVENOUS | Status: DC
Start: 2019-11-12 — End: 2019-11-12

## 2019-11-12 NOTE — Op Note (Signed)
[facility] Patient Name: Michelle Solomon Procedure Date: 11/12/2019 9:52 AM MRN: [id number] Endoscopist: Gatha Mayer , MD Age: 59 Referring MD:  Date of Birth: 03-Feb-1961 Gender: Female Account #: [id number] Procedure:                Colonoscopy Indications:              Screening for colorectal malignant neoplasm Medicines:                Propofol per Anesthesia, Monitored Anesthesia Care Procedure:                Pre-Anesthesia Assessment:                           - Prior to the procedure, a History and Physical                            was performed, and patient medications and                            allergies were reviewed. The patient's tolerance of                            previous anesthesia was also reviewed. The risks                            and benefits of the procedure and the sedation                            options and risks were discussed with the patient.                            All questions were answered, and informed consent                            was obtained. Prior Anticoagulants: The patient has                            taken no previous anticoagulant or antiplatelet                            agents. ASA Grade Assessment: II - A patient with                            mild systemic disease. After reviewing the risks                            and benefits, the patient was deemed in                            satisfactory condition to undergo the procedure.                           After obtaining informed consent, the colonoscope  was passed under direct vision. Throughout the                            procedure, the patient's blood pressure, pulse, and                            oxygen saturations were monitored continuously. The                            Colonoscope was introduced through the anus and                            advanced to the the cecum, identified by   appendiceal orifice and ileocecal valve. The                            colonoscopy was performed without difficulty. The                            patient tolerated the procedure well. The quality                            of the bowel preparation was excellent. The bowel                            preparation used was Miralax via split dose                            instruction. The ileocecal valve, appendiceal                            orifice, and rectum were photographed. Scope In: 10:03:12 AM Scope Out: 10:23:32 AM Scope Withdrawal Time: 0 hours 17 minutes 55 seconds  Total Procedure Duration: 0 hours 20 minutes 20 seconds  Findings:                 The perianal and digital rectal examinations were                            normal.                           Five sessile polyps were found in the rectum and                            ascending colon. The polyps were diminutive in                            size. These polyps were removed with a cold snare.                            Resection and retrieval were complete. Verification                            of patient identification for the  specimen was                            done. Estimated blood loss was minimal.                           The exam was otherwise without abnormality on                            direct and retroflexion views. Complications:            No immediate complications. Estimated Blood Loss:     Estimated blood loss was minimal. Impression:               - Five diminutive polyps in the rectum and in the                            ascending colon, removed with a cold snare.                            Resected and retrieved.                           - The examination was otherwise normal on direct                            and retroflexion views. Recommendation:           - Patient has a contact number available for                            emergencies. The signs and symptoms of potential                             delayed complications were discussed with the                            patient. Return to normal activities tomorrow.                            Written discharge instructions were provided to the                            patient.                           - Resume previous diet.                           - Continue present medications.                           - Repeat colonoscopy is recommended. The                            colonoscopy date will be determined after pathology  results from today's exam become available for                            review. Gatha Mayer, MD 11/12/2019 10:30:17 AM This report has been signed electronically.

## 2019-11-12 NOTE — Progress Notes (Signed)
Called to room to assist during endoscopic procedure.  Patient ID and intended procedure confirmed with present staff. Received instructions for my participation in the procedure from the performing physician.  

## 2019-11-12 NOTE — Patient Instructions (Addendum)
I found and removed 5 tiny polyps that look benign.  I will let you know pathology results and when to have another routine colonoscopy by mail and/or My Chart.  I appreciate the opportunity to care for you. Gatha Mayer, MD, FACG  YOU HAD AN ENDOSCOPIC PROCEDURE TODAY AT [facility]:   Refer to the procedure report that was given to you for any specific questions about what was found during the examination.  If the procedure report does not answer your questions, please call your gastroenterologist to clarify.  If you requested that your care partner not be given the details of your procedure findings, then the procedure report has been included in a sealed envelope for you to review at your convenience later.  **Handout given on polyps**  YOU SHOULD EXPECT: Some feelings of bloating in the abdomen. Passage of more gas than usual.  Walking can help get rid of the air that was put into your GI tract during the procedure and reduce the bloating. If you had a lower endoscopy (such as a colonoscopy or flexible sigmoidoscopy) you may notice spotting of blood in your stool or on the toilet paper. If you underwent a bowel prep for your procedure, you may not have a normal bowel movement for a few days.  Please Note:  You might notice some irritation and congestion in your nose or some drainage.  This is from the oxygen used during your procedure.  There is no need for concern and it should clear up in a day or so.  SYMPTOMS TO REPORT IMMEDIATELY:   Following lower endoscopy (colonoscopy or flexible sigmoidoscopy):  Excessive amounts of blood in the stool  Significant tenderness or worsening of abdominal pains  Swelling of the abdomen that is new, acute  Fever of 100F or higher  For urgent or emergent issues, a gastroenterologist can be reached at any hour by calling [phone]. Do not use MyChart messaging for urgent concerns.    DIET:  We do recommend a small meal at  first, but then you may proceed to your regular diet.  Drink plenty of fluids but you should avoid alcoholic beverages for 24 hours.  ACTIVITY:  You should plan to take it easy for the rest of today and you should NOT DRIVE or use heavy machinery until tomorrow (because of the sedation medicines used during the test).    FOLLOW UP: Our staff will call the number listed on your records 48-72 hours following your procedure to check on you and address any questions or concerns that you may have regarding the information given to you following your procedure. If we do not reach you, we will leave a message.  We will attempt to reach you two times.  During this call, we will ask if you have developed any symptoms of COVID 19. If you develop any symptoms (ie: fever, flu-like symptoms, shortness of breath, cough etc.) before then, please call [phone].  If you test positive for Covid 19 in the 2 weeks post procedure, please call and report this information to [place].    If any biopsies were taken you will be contacted by phone or by letter within the next 1-3 weeks.  Please call us at [phone] if you have not heard about the biopsies in 3 weeks.    SIGNATURES/CONFIDENTIALITY: You and/or your care partner have signed paperwork which will be entered into your electronic medical record.  These signatures attest to the fact that  that the information above on your After Visit Summary has been reviewed and is understood.  Full responsibility of the confidentiality of this discharge information lies with you and/or your care-partner.

## 2019-11-12 NOTE — Progress Notes (Signed)
Temp-LS VS-CW

## 2019-11-12 NOTE — Progress Notes (Signed)
Report to PACU, RN, vss, BBS= Clear.  

## 2019-11-14 ENCOUNTER — Telehealth: Payer: Self-pay

## 2019-11-14 NOTE — Telephone Encounter (Signed)
  Follow up Call-  Call back number 11/12/2019  Post procedure Call Back phone  # [phone]  Permission to leave phone message Yes  Some recent data might be hidden     Patient questions:  Do you have a fever, pain , or abdominal swelling? No. Pain Score  0 *  Have you tolerated food without any problems? Yes.    Have you been able to return to your normal activities? Yes.    Do you have any questions about your discharge instructions: Diet   No. Medications  No. Follow up visit  No.  Do you have questions or concerns about your Care? No.  Actions: * If pain score is 4 or above: No action needed, pain <4. 1. Have you developed a fever since your procedure? no  2.   Have you had an respiratory symptoms (SOB or cough) since your procedure? no  3.   Have you tested positive for COVID 19 since your procedure no  4.   Have you had any family members/close contacts diagnosed with the COVID 19 since your procedure?  no   If yes to any of these questions please route to Joylene John, RN and Erenest Rasher, RN

## 2019-11-18 ENCOUNTER — Ambulatory Visit: Payer: Self-pay

## 2019-11-20 ENCOUNTER — Telehealth: Payer: Self-pay | Admitting: Internal Medicine

## 2019-11-20 ENCOUNTER — Other Ambulatory Visit: Payer: Self-pay | Admitting: Internal Medicine

## 2019-11-20 ENCOUNTER — Ambulatory Visit: Payer: 59 | Admitting: Internal Medicine

## 2019-11-20 MED ORDER — OXYCODONE-ACETAMINOPHEN 5-325 MG PO TABS: 1.0000 | ORAL_TABLET | Freq: Four times a day (QID) | ORAL | 0 refills | Status: DC | PRN

## 2019-11-20 NOTE — Telephone Encounter (Signed)
Reviewed case. Called patient. PET already scheduled for next week. Will save her extra doctor's appt and further workup per Dr. Lorenso Courier. She can f/u with pulmonology PRN.

## 2019-11-20 NOTE — Telephone Encounter (Signed)
Done erx 

## 2019-11-21 ENCOUNTER — Encounter: Payer: Self-pay | Admitting: Internal Medicine

## 2019-11-23 ENCOUNTER — Ambulatory Visit: Payer: 59 | Attending: Internal Medicine

## 2019-11-27 ENCOUNTER — Other Ambulatory Visit: Payer: Self-pay

## 2019-11-27 ENCOUNTER — Ambulatory Visit (HOSPITAL_COMMUNITY)
Admission: RE | Admit: 2019-11-27 | Discharge: 2019-11-27 | Disposition: A | Discharge status: Home / Self Care | Payer: 59 | Source: Ambulatory Visit | Attending: Hematology and Oncology | Admitting: Hematology and Oncology

## 2019-11-27 DIAGNOSIS — R918 Other nonspecific abnormal finding of lung field: Secondary | ICD-10-CM | POA: Diagnosis present

## 2019-11-27 LAB — GLUCOSE, CAPILLARY: Glucose-Capillary: 98 mg/dL (ref 70–99)

## 2019-11-27 MED ORDER — FLUDEOXYGLUCOSE F - 18 (FDG) INJECTION
9.5000 | Freq: Once | INTRAVENOUS | Status: AC
  Administered 2019-11-27: 9.5 via INTRAVENOUS

## 2019-11-28 ENCOUNTER — Telehealth: Payer: Self-pay | Admitting: Hematology and Oncology

## 2019-11-28 DIAGNOSIS — M899 Disorder of bone, unspecified: Secondary | ICD-10-CM

## 2019-11-28 NOTE — Telephone Encounter (Signed)
Called Ms. Winchell to discuss the results of her PET CT scan.  Findings are concerning for an FDG avid area within the gluteal fold as well as some right inguinal lymph nodes.  There are also some sclerotic lesions noted throughout the spine with a reference lesion of 1.6 cm on L5.  On discussion with Ms. Crichlow today she notes that she does have a boil on her backside at the approximate location of the PET avidity.  Additionally this could explain the lymphadenopathy that we note on our PET scan given the local infection.  The only lesion that is not currently well explained by her symptoms would be the sclerotic lesion on L5.  At this time I would recommend an IR guided biopsy of this bone lesion to put to rest the potential causes of these lesions we are seeing on imaging.  Based on imaging so far there is no clear primary malignancy and no clear cause for this apparent disease.  Ms. Furio voiced her understanding of the plan and was agreeable to go forward with an IR guided biopsy of the sclerotic spine lesion on L5.  We will place the referral today and plan to see the patient back once pathology results are available.  [name], MD Department of Hematology/Oncology [place] [facility] Phone: [phone] Pager: [phone] Email: [email or web address]

## 2019-12-03 ENCOUNTER — Ambulatory Visit: Payer: 59 | Attending: Internal Medicine

## 2019-12-03 ENCOUNTER — Other Ambulatory Visit: Payer: Self-pay | Admitting: Hematology and Oncology

## 2019-12-03 ENCOUNTER — Encounter (HOSPITAL_COMMUNITY): Payer: Self-pay

## 2019-12-03 DIAGNOSIS — M899 Disorder of bone, unspecified: Secondary | ICD-10-CM

## 2019-12-03 DIAGNOSIS — Z23 Encounter for immunization: Secondary | ICD-10-CM

## 2019-12-03 NOTE — Progress Notes (Signed)
[employer], 59 y.o., [birth year] MRN:  [id number] Phone:  [phone] (M) PCP:  Biagio Borg, MD Coverage:  [place] [facility]/[employer] Other Next Appt With Oncology 01/29/2020 at 11:00 AM  RE: Biopsy Received: 4 days ago Message Contents  Michelle Edouard, MD  [facility]      Change to bone marrow biopsy with core of either iliac bone, favor left, at level of small sclerotic lesions. Discussed w/ Dr. Lorenso Courier. Might want to get an extra core biopsy on this case since we are going after lesion(s) and evaluating marrow.   GY   Previous Messages  ----- Message -----  From: Lenore Cordia  Sent: 11/29/2019 11:05 AM EDT  To: Ir Procedure Requests  Subject: Biopsy                      Procedure Requested: CT Biopsy    Reason for Procedure: Concern for metastatic disease in the spine (recommend attempted biopsy of L5 sclerotic lesion)    Provider Requesting: Dr Lorenso Courier  Provider Telephone: [phone]

## 2019-12-03 NOTE — Progress Notes (Signed)
   Covid-19 Vaccination Clinic  Name:  Michelle Solomon    MRN: [id number] DOB: 1960-09-10  12/03/2019  [name] was observed post Covid-19 immunization for 15 minutes without incident. She was provided with Vaccine Information Sheet and instruction to access the V-Safe system.   [name] was instructed to call 911 with any severe reactions post vaccine: [name] Kitchen Difficulty breathing  . Swelling of face and throat  . A fast heartbeat  . A bad rash all over body  . Dizziness and weakness   Immunizations Administered    Name Date Dose VIS Date Route   Pfizer COVID-19 Vaccine 12/03/2019 10:52 AM 0.3 mL 08/28/2018 Intramuscular   Manufacturer: [place]   Lot: [id number]   [place]: [id number]

## 2019-12-04 ENCOUNTER — Encounter (HOSPITAL_COMMUNITY): Payer: Self-pay

## 2019-12-04 NOTE — Progress Notes (Unsigned)
Michelle Solomon, 59 y.o., 20-Sep-1960 MRN:  [id number] Phone:  [phone] (M) PCP:  Biagio Borg, MD Coverage:  [place] [facility]/[employer] Other Next Appt With Radiology ([facility] 1) 12/11/2019 at 11:00 AM  RE: Biopsy Received: 5 days ago Message Contents  Michelle Edouard, MD  Michelle Solomon      Change to bone marrow biopsy with core of either iliac bone, favor left, at level of small sclerotic lesions. Discussed w/ Dr. Lorenso Courier. Might want to get an extra core biopsy on this case since we are going after lesion(s) and evaluating marrow.   GY   Previous Messages  ----- Message -----  From: Lenore Cordia  Sent: 11/29/2019 11:05 AM EDT  To: Ir Procedure Requests  Subject: Biopsy                      Procedure Requested: CT Biopsy    Reason for Procedure: Concern for metastatic disease in the spine (recommend attempted biopsy of L5 sclerotic lesion)    Provider Requesting: Dr Lorenso Courier  Provider Telephone: [phone]    Other Info: Rad exams in Epic

## 2019-12-10 ENCOUNTER — Other Ambulatory Visit: Payer: Self-pay | Admitting: Radiology

## 2019-12-11 ENCOUNTER — Other Ambulatory Visit: Payer: Self-pay

## 2019-12-11 ENCOUNTER — Ambulatory Visit (HOSPITAL_COMMUNITY)
Admission: RE | Admit: 2019-12-11 | Discharge: 2019-12-11 | Disposition: A | Discharge status: Home / Self Care | Payer: 59 | Source: Ambulatory Visit | Attending: Hematology and Oncology | Admitting: Hematology and Oncology

## 2019-12-11 ENCOUNTER — Encounter (HOSPITAL_COMMUNITY): Payer: Self-pay

## 2019-12-11 DIAGNOSIS — D509 Iron deficiency anemia, unspecified: Secondary | ICD-10-CM | POA: Diagnosis not present

## 2019-12-11 DIAGNOSIS — Z79899 Other long term (current) drug therapy: Secondary | ICD-10-CM | POA: Insufficient documentation

## 2019-12-11 DIAGNOSIS — M899 Disorder of bone, unspecified: Secondary | ICD-10-CM | POA: Diagnosis present

## 2019-12-11 DIAGNOSIS — Z86012 Personal history of benign carcinoid tumor: Secondary | ICD-10-CM | POA: Insufficient documentation

## 2019-12-11 DIAGNOSIS — Z86010 Personal history of colonic polyps: Secondary | ICD-10-CM | POA: Diagnosis not present

## 2019-12-11 DIAGNOSIS — I10 Essential (primary) hypertension: Secondary | ICD-10-CM | POA: Insufficient documentation

## 2019-12-11 DIAGNOSIS — Z87891 Personal history of nicotine dependence: Secondary | ICD-10-CM | POA: Insufficient documentation

## 2019-12-11 DIAGNOSIS — J45909 Unspecified asthma, uncomplicated: Secondary | ICD-10-CM | POA: Insufficient documentation

## 2019-12-11 DIAGNOSIS — F419 Anxiety disorder, unspecified: Secondary | ICD-10-CM | POA: Diagnosis not present

## 2019-12-11 LAB — CBC WITH DIFFERENTIAL/PLATELET
Abs Immature Granulocytes: 0.02 10*3/uL (ref 0.00–0.07)
Basophils Absolute: 0 10*3/uL (ref 0.0–0.1)
Basophils Relative: 0 %
Eosinophils Absolute: 0.2 10*3/uL (ref 0.0–0.5)
Eosinophils Relative: 2 %
HCT: 37.7 % (ref 36.0–46.0)
Hemoglobin: 12 g/dL (ref 12.0–15.0)
Immature Granulocytes: 0 %
Lymphocytes Relative: 56 %
Lymphs Abs: 4.5 10*3/uL — ABNORMAL HIGH (ref 0.7–4.0)
MCH: 26.4 pg (ref 26.0–34.0)
MCHC: 31.8 g/dL (ref 30.0–36.0)
MCV: 83 fL (ref 80.0–100.0)
Monocytes Absolute: 0.6 10*3/uL (ref 0.1–1.0)
Monocytes Relative: 8 %
Neutro Abs: 2.8 10*3/uL (ref 1.7–7.7)
Neutrophils Relative %: 34 %
Platelets: 325 10*3/uL (ref 150–400)
RBC: 4.54 MIL/uL (ref 3.87–5.11)
RDW: 14.6 % (ref 11.5–15.5)
WBC: 8.1 10*3/uL (ref 4.0–10.5)
nRBC: 0 % (ref 0.0–0.2)

## 2019-12-11 MED ORDER — MIDAZOLAM HCL 2 MG/2ML IJ SOLN
INTRAMUSCULAR | Status: AC
  Filled 2019-12-11: qty 4

## 2019-12-11 MED ORDER — LIDOCAINE HCL (PF) 1 % IJ SOLN
INTRAMUSCULAR | Status: AC | PRN
  Administered 2019-12-11: 10 mL

## 2019-12-11 MED ORDER — FENTANYL CITRATE (PF) 100 MCG/2ML IJ SOLN
INTRAMUSCULAR | Status: AC
  Filled 2019-12-11: qty 2

## 2019-12-11 MED ORDER — SODIUM CHLORIDE 0.9 % IV SOLN: INTRAVENOUS | Status: DC

## 2019-12-11 MED ORDER — MIDAZOLAM HCL 2 MG/2ML IJ SOLN
INTRAMUSCULAR | Status: AC | PRN
  Administered 2019-12-11 (×6): 1 mg via INTRAVENOUS

## 2019-12-11 MED ORDER — FENTANYL CITRATE (PF) 100 MCG/2ML IJ SOLN
INTRAMUSCULAR | Status: AC | PRN
  Administered 2019-12-11 (×3): 50 ug via INTRAVENOUS

## 2019-12-11 MED ORDER — MIDAZOLAM HCL 2 MG/2ML IJ SOLN
INTRAMUSCULAR | Status: AC
  Filled 2019-12-11: qty 2

## 2019-12-11 NOTE — Discharge Instructions (Signed)
Please call Interventional Radiology clinic [phone] with any questions or concerns.  You may remove your dressing and shower tomorrow.   Moderate Conscious Sedation, Adult, Care After These instructions provide you with information about caring for yourself after your procedure. Your health care provider may also give you more specific instructions. Your treatment has been planned according to current medical practices, but problems sometimes occur. Call your health care provider if you have any problems or questions after your procedure. What can I expect after the procedure? After your procedure, it is common:  To feel sleepy for several hours.  To feel clumsy and have poor balance for several hours.  To have poor judgment for several hours.  To vomit if you eat too soon. Follow these instructions at home: For at least 24 hours after the procedure:   Do not: ? Participate in activities where you could fall or become injured. ? Drive. ? Use heavy machinery. ? Drink alcohol. ? Take sleeping pills or medicines that cause drowsiness. ? Make important decisions or sign legal documents. ? Take care of children on your own.  Rest. Eating and drinking  Follow the diet recommended by your health care provider.  If you vomit: ? Drink water, juice, or soup when you can drink without vomiting. ? Make sure you have little or no nausea before eating solid foods. General instructions  Have a responsible adult stay with you until you are awake and alert.  Take over-the-counter and prescription medicines only as told by your health care provider.  If you smoke, do not smoke without supervision.  Keep all follow-up visits as told by your health care provider. This is important. Contact a health care provider if:  You keep feeling nauseous or you keep vomiting.  You feel light-headed.  You develop a rash.  You have a fever. Get help right away if:  You have trouble  breathing. This information is not intended to replace advice given to you by your health care provider. Make sure you discuss any questions you have with your health care provider. Document Revised: 06/02/2017 Document Reviewed: 10/10/2015 Elsevier Patient Education  2020 [place].   Bone Marrow Aspiration and Bone Marrow Biopsy, Adult, Care After This sheet gives you information about how to care for yourself after your procedure. Your health care provider may also give you more specific instructions. If you have problems or questions, contact your health care provider. What can I expect after the procedure? After the procedure, it is common to have:  Mild pain and tenderness.  Swelling.  Bruising. Follow these instructions at home: Puncture site care   Follow instructions from your health care provider about how to take care of the puncture site. Make sure you: ? Wash your hands with soap and water before and after you change your bandage (dressing). If soap and water are not available, use hand sanitizer. ? Change your dressing as told by your health care provider.  Check your puncture site every day for signs of infection. Check for: ? More redness, swelling, or pain. ? Fluid or blood. ? Warmth. ? Pus or a bad smell. Activity  Return to your normal activities as told by your health care provider. Ask your health care provider what activities are safe for you.  Do not lift anything that is heavier than 10 lb (4.5 kg), or the limit that you are told, until your health care provider says that it is safe.  Do not drive for 24  24 hours if you were given a sedative during your procedure. General instructions   Take over-the-counter and prescription medicines only as told by your health care provider.  Do not take baths, swim, or use a hot tub until your health care provider approves. Ask your health care provider if you may take showers. You may only be allowed to take  sponge baths.  If directed, put ice on the affected area. To do this: ? Put ice in a plastic bag. ? Place a towel between your skin and the bag. ? Leave the ice on for 20 minutes, 2-3 times a day.  Keep all follow-up visits as told by your health care provider. This is important. Contact a health care provider if:  Your pain is not controlled with medicine.  You have a fever.  You have more redness, swelling, or pain around the puncture site.  You have fluid or blood coming from the puncture site.  Your puncture site feels warm to the touch.  You have pus or a bad smell coming from the puncture site. Summary  After the procedure, it is common to have mild pain, tenderness, swelling, and bruising.  Follow instructions from your health care provider about how to take care of the puncture site and what activities are safe for you.  Take over-the-counter and prescription medicines only as told by your health care provider.  Contact a health care provider if you have any signs of infection, such as fluid or blood coming from the puncture site. This information is not intended to replace advice given to you by your health care provider. Make sure you discuss any questions you have with your health care provider. Document Revised: 11/06/2018 Document Reviewed: 11/06/2018 Elsevier Patient Education  2020 [employer]  

## 2019-12-11 NOTE — Procedures (Signed)
Interventional Radiology Procedure Note  Procedure: CT guided aspirate and core biopsy of right iliac bone Complications: None Recommendations: - Bedrest supine x 1 hrs - Hydrocodone PRN  Pain - Follow biopsy results  Signed,  Aashvi Rezabek K. Tyeasha Ebbs, MD   

## 2019-12-11 NOTE — H&P (Signed)
Chief Complaint: Patient was seen in consultation today for CT-guided bone marrow biopsy  Referring Physician(s): [place] T IV  Supervising Physician: Jacqulynn Cadet  Patient Status: [facility] - Out-pt  History of Present Illness: Michelle Solomon is a 59 y.o. 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 Her last colonoscopy in 2011 was clear. She was to return for a follow up colonoscopy in 2016 but this was not done. In February 2021 she presented to the ED with complaints of abdominal and flank pain. CT imaging showed a nodule at the 8th intercostal space and an MRI thoracic spine 10/18/2019 demonstrated, " 1. Enhancing lesions throughout the visualized spine consistent with metastatic disease. No evidence of epidural tumor. 2. 1.5 cm soft tissue nodule in the posteromedial right eighth intercostal space, more concerning for a metastasis (as opposed to peripheral nerve sheath tumor) in light of #1. She presented to IR on 11/05/2019 for a CT-guided biopsy of this lesion with Dr. Annamaria Boots, however, CT imaging that day showed the lesion to be nearly resolved. Dr. Annamaria Boots recommended follow up surveillance imaging versus a PET/CT.   PET scan 11/27/2019: 1. Hypermetabolic right inguinal lymph nodes and osseous lesions, indicative of metastatic disease of unknown primary. 2. Focal areas of soft tissue thickening in the subcutaneous right gluteal fold with associated hypermetabolism. Findings may be infectious or inflammatory in etiology. Difficult to exclude metastatic disease.   Dr. Lorenso Courier has requested for IR to perform a, "bone marrow biopsy with core of either iliac bone, favor left, at level of small sclerotic lesions. Might want to get an extra core biopsy on this case since we are going after lesion(s) and evaluating marrow."   Her imaging was reviewed by Dr. Kathlene Cote.   Past Medical History:  Diagnosis Date  . Acute bronchitis 06/06/2010  .  ALLERGIC RHINITIS 05/29/2007  . Allergy    seasonal  . Anal or rectal pain 06/23/2008  . ANEMIA-IRON DEFICIENCY 05/29/2007  . Anxiety   . ASTHMA 05/29/2007  . Asthma   . 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 CONJUNCTIVITIS, ALLERGIC 11/27/2008  . HEMORRHOIDS 06/16/2008  . HTN (hypertension)   . Hx of adenomatous colonic polyps 11/2019  . NIPPLE DISCHARGE 01/21/2010  . Other specified forms of hearing loss 10/08/2009  . Overweight(278.02) 05/29/2007  . RASH-NONVESICULAR 05/29/2007  . Wheezing 06/22/2010    Past Surgical History:  Procedure Laterality Date  . COLONOSCOPY      Allergies: Patient has no known allergies.  Medications: Prior to Admission medications   Medication Sig Start Date End Date Taking? Authorizing Provider  ALPRAZolam Duanne Moron) 0.5 MG tablet Take 1 tablet (0.5 mg total) by mouth 2 (two) times daily as needed for anxiety or sleep. 08/16/18   Biagio Borg, MD  buPROPion (WELLBUTRIN XL) 300 MG 24 hr tablet Take 1 tablet (300 mg total) by mouth daily. 10/24/19   Biagio Borg, MD  cholecalciferol (VITAMIN D3) 25 MCG (1000 UNIT) tablet Take 1,000 Units by mouth daily.    [provider]  escitalopram (LEXAPRO) 20 MG tablet Take 20 mg by mouth daily.    [provider]  hydrochlorothiazide (HYDRODIURIL) 12.5 MG tablet Take 1 tablet (12.5 mg total) by mouth daily. 05/27/19   Biagio Borg, MD  hydrocortisone 2.5 % cream Apply topically 2 (two) times daily. Patient not taking: Reported on 11/01/2019 11/09/17   Biagio Borg, MD  iron polysaccharides (NU-IRON) 150 MG capsule  Take 1 capsule (150 mg total) by mouth daily. 08/30/19   Biagio Borg, MD  loratadine (CLARITIN) 10 MG tablet Take 10 mg by mouth daily as needed for allergies.    [provider]  oxyCODONE-acetaminophen (PERCOCET/ROXICET) 5-325 MG tablet Take 1 tablet by mouth every 6 (six) hours as needed for severe pain. 11/20/19   Biagio Borg, MD  Turmeric 500 MG CAPS Take 500  mg by mouth daily.    [provider]  vitamin B-12 (CYANOCOBALAMIN) 100 MCG tablet Take 100 mcg by mouth daily.    [provider]  VITAMIN D-VITAMIN K PO Take 1 tablet by mouth daily.    [provider]  zolpidem (AMBIEN) 10 MG tablet Take 1 tablet (10 mg total) by mouth at bedtime as needed for up to 30 days for sleep. 08/01/18 11/01/19  Biagio Borg, MD     Family History  Problem Relation Age of Onset  . Allergies Sister   . Diabetes Sister   . Hypertension Sister   . Hypertension Mother   . Prostate cancer Father   . Hypertension Brother   . Hypertension Sister   . Diabetes Paternal Aunt   . Cancer Paternal Aunt        type unknown  . Prostate cancer Brother   . Stroke Maternal Aunt   . Hypertension Maternal Aunt   . Colon cancer Neg Hx   . Esophageal cancer Neg Hx   . Rectal cancer Neg Hx   . Stomach cancer Neg Hx     Social History   Socioeconomic History  . Marital status: Married    Spouse name: Not on file  . Number of children: 3  . Years of education: Not on file  . Highest education level: Not on file  Occupational History  . Occupation: CARD REPLACEMEN    Employer: [employer]  . Occupation: customer service  Tobacco Use  . Smoking status: Former Smoker    Packs/day: 0.50    Years: 40.00    Pack years: 20.00    Types: Cigarettes    Quit date: 10/22/2019    Years since quitting: 0.1  . Smokeless tobacco: Never Used  . Tobacco comment: 1 pack weekly.  started smoking at 59 years old  Substance and Sexual Activity  . Alcohol use: Yes    Comment: ocassional  . Drug use: No  . Sexual activity: Not on file  Other Topics Concern  . Not on file  Social History Narrative   Daily caffeine use 3   Patient does not get regular exercise   Social Determinants of Health   Financial Resource Strain:   . Difficulty of Paying Living Expenses:   Food Insecurity:   . Worried About Charity fundraiser in the Last Year:   . Arts development officer in the Last Year:   Transportation Needs:   . Film/video editor (Medical):   [name] Kitchen Lack of Transportation (Non-Medical):   Physical Activity:   . Days of Exercise per Week:   . Minutes of Exercise per Session:   Stress:   . Feeling of Stress :   Social Connections:   . Frequency of Communication with Friends and Family:   . Frequency of Social Gatherings with Friends and Family:   . Attends Religious Services:   . Active Member of Clubs or Organizations:   . Attends Archivist Meetings:   [name] Kitchen Marital Status:     Review of  Systems: A 12 point ROS discussed and pertinent positives are indicated in the HPI above.  All other systems are negative.  Review of Systems  Constitutional: Negative for activity change, appetite change, fatigue and fever.  Respiratory: Negative for cough and shortness of breath.   Cardiovascular: Negative for chest pain and leg swelling.  Gastrointestinal: Negative for abdominal distention, abdominal pain, diarrhea, nausea and vomiting.  Musculoskeletal: Negative for back pain.  Neurological: Negative for headaches.    Vital Signs: BP (!) 133/92 (BP Location: Right Arm)   Pulse 87   Temp 98.2 F (36.8 C) (Oral)   Resp 18   LMP 10/26/2017   SpO2 100%   Physical Exam Constitutional:      General: She is not in acute distress.    Appearance: Normal appearance.  HENT:     Mouth/Throat:     Mouth: Mucous membranes are moist.  Cardiovascular:     Rate and Rhythm: Normal rate and regular rhythm.     Pulses: Normal pulses.     Heart sounds: Normal heart sounds.  Pulmonary:     Effort: Pulmonary effort is normal.     Breath sounds: Normal breath sounds.  Abdominal:     General: Bowel sounds are normal. There is no distension.     Palpations: Abdomen is soft.     Tenderness: There is no abdominal tenderness.  Musculoskeletal:        General: Normal range of motion.  Skin:    General: Skin is warm and dry.  Neurological:      Mental Status: She is alert and oriented to person, place, and time.  Psychiatric:        Mood and Affect: Mood normal.        Behavior: Behavior normal.        Thought Content: Thought content normal.        Judgment: Judgment normal.     Imaging: NM PET Image Initial (PI) Skull Base To Thigh  Result Date: 11/27/2019 CLINICAL DATA:  Initial treatment strategy for bone lesions. EXAM: NUCLEAR MEDICINE PET SKULL BASE TO THIGH TECHNIQUE: 9.5 mCi F-18 FDG was injected intravenously. Full-ring PET imaging was performed from the skull base to thigh after the radiotracer. CT data was obtained and used for attenuation correction and anatomic localization. Fasting blood glucose: 98 mg/dl COMPARISON:  MR thoracic spine 10/18/2019, CT chest 09/06/2019 and CT abdomen pelvis 08/26/2019. FINDINGS: Mediastinal blood pool activity: SUV max 2.9 Liver activity: SUV max NA NECK: No abnormal hypermetabolism. Incidental CT findings: None. CHEST: A nodule in the upper right breast and bilateral axillary lymph nodes do not show abnormal hypermetabolism above blood pool. No hypermetabolic mediastinal or hilar lymph nodes. No hypermetabolic pulmonary nodules. Incidental CT findings: Atherosclerotic calcification of the aorta. Heart is at the upper limits of normal in size. No pericardial or pleural effusion. ABDOMEN/PELVIS: No abnormal hypermetabolism in the liver, adrenal glands, spleen or pancreas. Hypermetabolic right inguinal lymph nodes measure up to 1.3 cm (4/173) with an SUV max of 6.0. Left inguinal lymph nodes do not show hypermetabolism above blood pool. No additional hypermetabolic lymph nodes. There are 2 areas of soft tissue thickening in the subcutaneous right gluteal fold (4/194) with an SUV max of 11.8 posteriorly. Incidental CT findings: Liver, gallbladder, adrenal glands, kidneys, spleen, pancreas, stomach and bowel are grossly unremarkable. SKELETON: There are vague areas of subtle sclerosis throughout the  visualized osseous structures. Index lesion in the L5 vertebral body measures 1.6 cm (4/138) with an  SUV max of 5.9. Incidental CT findings: None. IMPRESSION: 1. Hypermetabolic right inguinal lymph nodes and osseous lesions, indicative of metastatic disease of unknown primary. 2. Focal areas of soft tissue thickening in the subcutaneous right gluteal fold with associated hypermetabolism. Findings may be infectious or inflammatory in etiology. Difficult to exclude metastatic disease. Electronically Signed   By: Lorin Picket M.D.   On: 11/27/2019 13:52    Labs:  CBC: Recent Labs    08/26/19 1653 08/28/19 1314 10/25/19 0921  WBC 8.3 7.6 8.1  HGB 13.7 12.9 13.3  HCT 42.9 39.7 41.6  PLT 291 260.0 254    COAGS: No results for input(s): INR, APTT in the last 8760 hours.  BMP: Recent Labs    08/26/19 1653 08/28/19 1314 10/25/19 0921  NA 137 140 143  K 4.0 3.7 4.0  CL 98 104 106  CO2 30 29 24   GLUCOSE 95 97 106*  BUN 9 12 11   CALCIUM 10.3 9.6 9.2  CREATININE 0.84 0.71 0.74  GFRNONAA >60  --  >60  GFRAA >60  --  >60    LIVER FUNCTION TESTS: Recent Labs    08/26/19 1653 08/28/19 1314 10/25/19 0921  BILITOT 0.4 0.3 0.3  AST 25 22 18   ALT 25 21 17   ALKPHOS 89 92 105  PROT 7.2 7.5 7.8  ALBUMIN 3.8 3.9 3.9    TUMOR MARKERS: No results for input(s): AFPTM, CEA, CA199, CHROMGRNA in the last 8760 hours.  Assessment and Plan: Perrin Millirons, 59 year old female has a history of neuroendocrine tumors of the rectum. Recent imaging shows the possibility of metastatic disease. The patient presents to the [facility] department today for a bone marrow aspiration and biopsy for further work up and evaluation.   Risks and benefits of bone marrow aspiration and biopsy were discussed with the patient including, but not limited to bleeding, infection, damage to adjacent structures or low yield requiring additional tests.  All of the questions were answered  and there is agreement to proceed.  Consent signed and in chart.  Patient has been NPO. She is not on any blood thinning medications. Labs and vitals have been reviewed.   Thank you for this interesting consult.  I greatly enjoyed meeting Arliene Darius and look forward to participating in their care.  A copy of this report was sent to the requesting provider on this date.  Electronically Signed: Theresa Duty, NP 12/11/2019, 9:37 AM   I spent a total of  30 Minutes   in face to face in clinical consultation, greater than 50% of which was counseling/coordinating care for CT guided bone marrow aspiration and biopsy

## 2019-12-14 LAB — SURGICAL PATHOLOGY

## 2019-12-16 ENCOUNTER — Telehealth: Payer: Self-pay | Admitting: Hematology and Oncology

## 2019-12-16 NOTE — Telephone Encounter (Signed)
Scheduled per sch msg. Called and left msg  

## 2019-12-17 ENCOUNTER — Telehealth: Payer: Self-pay | Admitting: Hematology and Oncology

## 2019-12-17 NOTE — Telephone Encounter (Signed)
Called Ms. Vogler to discuss the results of her bone biopsy.  Findings are consistent with a neuroendocrine tumor.  This is similar in pathology to the one that was noted in her rectum and had previously been resected.  Fortunately at this time it appears the disease is isolated only to the spine.  Given these findings it appears that she does have metastatic stage IV disease, though fortunately it appears to be of very small tumor burden.  I plan to have the patient return to clinic on Friday discussed treatment options moving forward.  I do not believe that in his current position the tumor is surgically resectable and that these tend to be not amenable to radiation therapy.  As such we could recommend lanreotide or octreotide as treatment options moving forward.  Observation is also not unreasonable in situations of low tumor burden.  We will plan to discuss this in detail with the patient on her next visit 12/20/2019.  [name], MD Department of Hematology/Oncology [place] [facility] Phone: [phone] Pager: [phone] Email: [email or web address]

## 2019-12-19 ENCOUNTER — Encounter (HOSPITAL_COMMUNITY): Payer: Self-pay | Admitting: Hematology and Oncology

## 2019-12-19 NOTE — Progress Notes (Signed)
Michelle Solomon [phone]   [phone]  PROGRESS NOTE  Patient Care Team: Michelle Borg, MD as PCP - General  Hematological/Oncological History # Metastatic Neuroendocrine Tumor, Metastasis to the Spine (Ki-67 pending) 1) 08/26/2019: CT Abdomen Pelvis performed due to RUQ abdominal pain. Imaging showed a soft tissue mass abutting the right posterior eighth rib 2) 09/06/2019: CT chest performed which showed Ill-defined soft tissue nodule along the medial right posterior thoracic wall at the 8th intercostal space to the right of the spine. MRI was recommended. 3) 10/19/2019: MRI Thoracic spine showed enhancing lesions throughout the visualized spine consistent with metastatic disease. Additionally there was a 1.5 cm soft tissue nodule in the posteromedial right eighth intercostal space, more concerning for a metastasis 4) 10/25/2019: establish care with Dr. Lorenso Solomon   5) 11/05/2019: attempted biopsy of soft tissue mass at right posterior 8th rib, but lesion had dissipated at time of biopsy attempt 6) 11/27/2019: PET CT scan performed showing right inguinal lymph nodes and osseous lesions, indicative of metastatic disease of unknown primary. 7) 12/11/2019: CT bone marrow biopsy showed metastatic neuroendocrine neoplasm 8) 12/27/2019: intended start of lanreotide 133m sub q28 days   #Low Grade Neuroendocrine Tumor 1) 2006: reportedly had rectal carcinoid tumor removed 2) 08/14/2008: patient had a flexibile sigmoidoscopy with endoscopic ultrasound which resected an 839m subepithelial lesion in rectum. Findings consistent with low grade neuroendocrine tumor. 3) 04/08/2010: repeat colonoscopy, no residual tumor. Repeat recommended in 2016.  4) 12/11/2019: metastatic recurrence found on biopsy, noted above.   Interval History:  Michelle CaSutphen59.o. female with medical history significant for metastatic neuroendocrine tumor of the colon who presents for a follow up visit. The  patient's last visit was on 10/25/2019 at which time she established care. In the interim since the last visit the patient has had a PET CT and biopsy which confirm the diagnosis of metastatic neuroendocrine tumor, consistent with the patient's prior resected tumor.   On exam today Michelle Solomon that she has been well since her last visit.  She notes that she does have some residual tenderness at the site of her bone marrow biopsy site but she has not had any bruising or bleeding at that site.  She notes that her appetite has been good and her weight has been stable.  She denies having any frank back pain or new concerning neurological symptoms.  She reports that she does occasionally have hot flashes but she thinks that might just be a part of her menopause.  Unfortunately she also did restart smoking approximately 2 weeks ago.  Denies having any issues with fevers, chills, sweats, nausea, vomiting or diarrhea.  A full 10 point ROS is listed below.  Today the bulk of our discussion focused on the diagnosis of neuroendocrine tumor as well as the treatment options moving forward.  These are detailed below in the assessment and plan.  MEDICAL HISTORY:  Past Medical History:  Diagnosis Date  . Acute bronchitis 06/06/2010  . ALLERGIC RHINITIS 05/29/2007  . Allergy    seasonal  . Anal or rectal pain 06/23/2008  . ANEMIA-IRON DEFICIENCY 05/29/2007  . Anxiety   . ASTHMA 05/29/2007  . Asthma   . 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 CONJUNCTIVITIS, ALLERGIC 11/27/2008  . HEMORRHOIDS 06/16/2008  . HTN (hypertension)   . Hx of adenomatous colonic polyps 11/2019  . NIPPLE DISCHARGE 01/21/2010  . Other specified forms of hearing loss 10/08/2009  . Overweight(278.02) 05/29/2007  .  RASH-NONVESICULAR 05/29/2007  . Wheezing 06/22/2010    SURGICAL HISTORY: Past Surgical History:  Procedure Laterality Date  . COLONOSCOPY      SOCIAL HISTORY: Social History   Socioeconomic History  .  Marital status: Married    Spouse name: Not on file  . Number of children: 3  . Years of education: Not on file  . Highest education level: Not on file  Occupational History  . Occupation: CARD REPLACEMEN    Employer: [employer]  . Occupation: customer service  Tobacco Use  . Smoking status: Former Smoker    Packs/day: 0.50    Years: 40.00    Pack years: 20.00    Types: Cigarettes    Quit date: 10/22/2019    Years since quitting: 0.1  . Smokeless tobacco: Never Used  . Tobacco comment: 1 pack weekly.  started smoking at 59 years old  Vaping Use  . Vaping Use: Never used  Substance and Sexual Activity  . Alcohol use: Yes    Comment: ocassional  . Drug use: No  . Sexual activity: Not on file  Other Topics Concern  . Not on file  Social History Narrative   Daily caffeine use 3   Patient does not get regular exercise   Social Determinants of Health   Financial Resource Strain:   . Difficulty of Paying Living Expenses:   Food Insecurity:   . Worried About Charity fundraiser in the Last Year:   . Arboriculturist in the Last Year:   Transportation Needs:   . Film/video editor (Medical):   [name] Kitchen Lack of Transportation (Non-Medical):   Physical Activity:   . Days of Exercise per Week:   . Minutes of Exercise per Session:   Stress:   . Feeling of Stress :   Social Connections:   . Frequency of Communication with Friends and Family:   . Frequency of Social Gatherings with Friends and Family:   . Attends Religious Services:   . Active Member of Clubs or Organizations:   . Attends Archivist Meetings:   [name] Kitchen Marital Status:   Intimate Partner Violence:   . Fear of Current or Ex-Partner:   . Emotionally Abused:   [name] Kitchen Physically Abused:   . Sexually Abused:     FAMILY HISTORY: Family History  Problem Relation Age of Onset  . Allergies Sister   . Diabetes Sister   . Hypertension Sister   . Hypertension Mother   . Prostate cancer Father   . Hypertension  Brother   . Hypertension Sister   . Diabetes Paternal Aunt   . Cancer Paternal Aunt        type unknown  . Prostate cancer Brother   . Stroke Maternal Aunt   . Hypertension Maternal Aunt   . Colon cancer Neg Hx   . Esophageal cancer Neg Hx   . Rectal cancer Neg Hx   . Stomach cancer Neg Hx     ALLERGIES:  has No Known Allergies.  MEDICATIONS:  Current Outpatient Medications  Medication Sig Dispense Refill  . ALPRAZolam (XANAX) 0.5 MG tablet Take 1 tablet (0.5 mg total) by mouth 2 (two) times daily as needed for anxiety or sleep. 60 tablet 2  . buPROPion (WELLBUTRIN XL) 300 MG 24 hr tablet Take 1 tablet (300 mg total) by mouth daily. 90 tablet 0  . cholecalciferol (VITAMIN D3) 25 MCG (1000 UNIT) tablet Take 1,000 Units by mouth daily.    [name] Kitchen escitalopram (LEXAPRO)  20 MG tablet Take 20 mg by mouth daily.    . hydrochlorothiazide (HYDRODIURIL) 12.5 MG tablet Take 1 tablet (12.5 mg total) by mouth daily. 90 tablet 0  . hydrocortisone 2.5 % cream Apply topically 2 (two) times daily. (Patient not taking: Reported on 11/01/2019) 30 g 1  . iron polysaccharides (NU-IRON) 150 MG capsule Take 1 capsule (150 mg total) by mouth daily. 90 capsule 1  . loratadine (CLARITIN) 10 MG tablet Take 10 mg by mouth daily as needed for allergies.    [name] Kitchen oxyCODONE-acetaminophen (PERCOCET/ROXICET) 5-325 MG tablet Take 1 tablet by mouth every 6 (six) hours as needed for severe pain. 30 tablet 0  . Turmeric 500 MG CAPS Take 500 mg by mouth daily.    . vitamin B-12 (CYANOCOBALAMIN) 100 MCG tablet Take 100 mcg by mouth daily.    [name] Kitchen VITAMIN D-VITAMIN K PO Take 1 tablet by mouth daily.    [name] Kitchen zolpidem (AMBIEN) 10 MG tablet Take 1 tablet (10 mg total) by mouth at bedtime as needed for up to 30 days for sleep. 30 tablet 5   No current facility-administered medications for this visit.    REVIEW OF SYSTEMS:   Constitutional: ( - ) fevers, ( - )  chills , ( - ) night sweats Eyes: ( - ) blurriness of vision, ( - ) double  vision, ( - ) watery eyes Ears, nose, mouth, throat, and face: ( - ) mucositis, ( - ) sore throat Respiratory: ( - ) cough, ( - ) dyspnea, ( - ) wheezes Cardiovascular: ( - ) palpitation, ( - ) chest discomfort, ( - ) lower extremity swelling Gastrointestinal:  ( - ) nausea, ( - ) heartburn, ( - ) change in bowel habits Skin: ( - ) abnormal skin rashes Lymphatics: ( - ) new lymphadenopathy, ( - ) easy bruising Neurological: ( - ) numbness, ( - ) tingling, ( - ) new weaknesses Behavioral/Psych: ( - ) mood change, ( - ) new changes  All other systems were reviewed with the patient and are negative.  PHYSICAL EXAMINATION: ECOG PERFORMANCE STATUS: 0 - Asymptomatic  Vitals:   12/20/19 0849  BP: (!) 124/93  Pulse: 96  Resp: 18  Temp: 97.7 F (36.5 C)  SpO2: 100%   Filed Weights   12/20/19 0849  Weight: 189 lb 3.2 oz (85.8 kg)    GENERAL: well appearing middle aged [place] American female in NAD  SKIN: skin color, texture, turgor are normal, no rashes or significant lesions EYES: conjunctiva are pink and non-injected, sclera clear LUNGS: clear to auscultation and percussion with normal breathing effort HEART: regular rate & rhythm and no murmurs and no lower extremity edema Musculoskeletal: no cyanosis of digits and no clubbing  PSYCH: alert & oriented x 3, fluent speech NEURO: no focal motor/sensory deficits  LABORATORY DATA:  I have reviewed the data as listed CBC Latest Ref Rng & Units 12/11/2019 10/25/2019 08/28/2019  WBC 4.0 - 10.5 K/uL 8.1 8.1 7.6  Hemoglobin 12.0 - 15.0 g/dL 12.0 13.3 12.9  Hematocrit 36 - 46 % 37.7 41.6 39.7  Platelets 150 - 400 K/uL 325 254 260.0    CMP Latest Ref Rng & Units 10/25/2019 08/28/2019 08/26/2019  Glucose 70 - 99 mg/dL 106(H) 97 95  BUN 6 - 20 mg/dL _0 Creatinine 0.44 - 1.00 mg/dL 0.74 0.71 0.84  Sodium 135 - 145 mmol/L 143 140 137  Potassium 3.5 - 5.1 mmol/L 4.0 3.7 4.0  Chloride 98 -  111 mmol/L 106 104 98  CO2 22 - 32 mmol/L _0 Calcium 8.9 - 10.3 mg/dL 9.2 9.6 10.3  Total Protein 6.5 - 8.1 g/dL 7.8 7.5 7.2  Total Bilirubin 0.3 - 1.2 mg/dL 0.3 0.3 0.4  Alkaline Phos 38 - 126 U/L 105 92 89  AST 15 - 41 U/L _1 ALT 0 - 44 U/L _2 RADIOGRAPHIC STUDIES: I have personally reviewed the radiological images as listed and agreed with the findings in the report: increased FDG intake in several areas of the spine and inguinal lymph nodes.  NM PET Image Initial (PI) Skull Base To Thigh  Result Date: 11/27/2019 CLINICAL DATA:  Initial treatment strategy for bone lesions. EXAM: NUCLEAR MEDICINE PET SKULL BASE TO THIGH TECHNIQUE: 9.5 mCi F-18 FDG was injected intravenously. Full-ring PET imaging was performed from the skull base to thigh after the radiotracer. CT data was obtained and used for attenuation correction and anatomic localization. Fasting blood glucose: 98 mg/dl COMPARISON:  MR thoracic spine 10/18/2019, CT chest 09/06/2019 and CT abdomen pelvis 08/26/2019. FINDINGS: Mediastinal blood pool activity: SUV max 2.9 Liver activity: SUV max NA NECK: No abnormal hypermetabolism. Incidental CT findings: None. CHEST: A nodule in the upper right breast and bilateral axillary lymph nodes do not show abnormal hypermetabolism above blood pool. No hypermetabolic mediastinal or hilar lymph nodes. No hypermetabolic pulmonary nodules. Incidental CT findings: Atherosclerotic calcification of the aorta. Heart is at the upper limits of normal in size. No pericardial or pleural effusion. ABDOMEN/PELVIS: No abnormal hypermetabolism in the liver, adrenal glands, spleen or pancreas. Hypermetabolic right inguinal lymph nodes measure up to 1.3 cm (4/173) with an SUV max of 6.0. Left inguinal lymph nodes do not show hypermetabolism above blood pool. No additional hypermetabolic lymph nodes. There are 2 areas of soft tissue thickening in the subcutaneous right gluteal fold (4/194) with an SUV max of 11.8 posteriorly. Incidental CT  findings: Liver, gallbladder, adrenal glands, kidneys, spleen, pancreas, stomach and bowel are grossly unremarkable. SKELETON: There are vague areas of subtle sclerosis throughout the visualized osseous structures. Index lesion in the L5 vertebral body measures 1.6 cm (4/138) with an SUV max of 5.9. Incidental CT findings: None. IMPRESSION: 1. Hypermetabolic right inguinal lymph nodes and osseous lesions, indicative of metastatic disease of unknown primary. 2. Focal areas of soft tissue thickening in the subcutaneous right gluteal fold with associated hypermetabolism. Findings may be infectious or inflammatory in etiology. Difficult to exclude metastatic disease. Electronically Signed   By: Lorin Picket M.D.   On: 11/27/2019 13:52   CT BIOPSY  Result Date: 12/11/2019 INDICATION: 59 year old female with a past history of colonic neuroendocrine tumor and new multifocal small sclerotic osseous lesions concerning for metastatic disease. She presents for CT-guided bone marrow biopsy with an additional core sample obtained for surgical pathology. EXAM: CT GUIDED BONE MARROW ASPIRATION AND CORE BIOPSY Interventional Radiologist:  Criselda Peaches, MD MEDICATIONS: None. ANESTHESIA/SEDATION: Moderate (conscious) sedation was employed during this procedure. A total of 6 milligrams versed and 150 micrograms fentanyl were administered intravenously. The patient's level of consciousness and vital signs were monitored continuously by radiology nursing throughout the procedure under my direct supervision. Total monitored sedation time: 14 minutes FLUOROSCOPY TIME:  None. COMPLICATIONS: None immediate. Estimated blood loss: <25 mL PROCEDURE: Informed written consent was obtained from the patient after a thorough discussion of the procedural risks, benefits and alternatives. All questions were addressed. Maximal Sterile Barrier Technique was  utilized including caps, mask, sterile gowns, sterile gloves, sterile drape, hand  hygiene and skin antiseptic. A timeout was performed prior to the initiation of the procedure. The patient was positioned prone and non-contrast localization CT was performed of the pelvis to demonstrate the iliac marrow spaces. Maximal barrier sterile technique utilized including caps, mask, sterile gowns, sterile gloves, large sterile drape, hand hygiene, and betadine prep. Under sterile conditions and local anesthesia, an 11 gauge coaxial bone biopsy needle was advanced into the right iliac marrow space. Needle position was confirmed with CT imaging. Initially, bone marrow aspiration was performed. Next, the 11 gauge outer cannula was utilized to obtain a right iliac bone marrow core biopsy. A second core was then obtained. Needle was removed. Hemostasis was obtained with compression. The patient tolerated the procedure well. Samples were prepared with the cytotechnologist. IMPRESSION: Technically successful CT-guided bone marrow aspiration and core biopsy x2 of the right iliac bone. Signed, Criselda Peaches, MD, [place] [facility] Radiology Electronically Signed   By: Jacqulynn Cadet M.D.   On: 12/11/2019 15:08   CT BONE MARROW BIOPSY & ASPIRATION  Result Date: 12/11/2019 INDICATION: 59 year old female with a past history of colonic neuroendocrine tumor and new multifocal small sclerotic osseous lesions concerning for metastatic disease. She presents for CT-guided bone marrow biopsy with an additional core sample obtained for surgical pathology. EXAM: CT GUIDED BONE MARROW ASPIRATION AND CORE BIOPSY Interventional Radiologist:  Criselda Peaches, MD MEDICATIONS: None. ANESTHESIA/SEDATION: Moderate (conscious) sedation was employed during this procedure. A total of 6 milligrams versed and 150 micrograms fentanyl were administered intravenously. The patient's level of consciousness and vital signs were monitored continuously by radiology nursing throughout  the procedure under my direct supervision. Total monitored sedation time: 14 minutes FLUOROSCOPY TIME:  None. COMPLICATIONS: None immediate. Estimated blood loss: <25 mL PROCEDURE: Informed written consent was obtained from the patient after a thorough discussion of the procedural risks, benefits and alternatives. All questions were addressed. Maximal Sterile Barrier Technique was utilized including caps, mask, sterile gowns, sterile gloves, sterile drape, hand hygiene and skin antiseptic. A timeout was performed prior to the initiation of the procedure. The patient was positioned prone and non-contrast localization CT was performed of the pelvis to demonstrate the iliac marrow spaces. Maximal barrier sterile technique utilized including caps, mask, sterile gowns, sterile gloves, large sterile drape, hand hygiene, and betadine prep. Under sterile conditions and local anesthesia, an 11 gauge coaxial bone biopsy needle was advanced into the right iliac marrow space. Needle position was confirmed with CT imaging. Initially, bone marrow aspiration was performed. Next, the 11 gauge outer cannula was utilized to obtain a right iliac bone marrow core biopsy. A second core was then obtained. Needle was removed. Hemostasis was obtained with compression. The patient tolerated the procedure well. Samples were prepared with the cytotechnologist. IMPRESSION: Technically successful CT-guided bone marrow aspiration and core biopsy x2 of the right iliac bone. Signed, Criselda Peaches, MD, [place] [facility] Radiology Electronically Signed   By: Jacqulynn Cadet M.D.   On: 12/11/2019 15:08    ASSESSMENT & PLAN Michelle Solomon 59 y.o. female with medical history significant for metastatic neuroendocrine tumor of the colon who presents for a follow up visit.  After review of the labs, review the pathology, and review the PET CT imaging the findings are most consistent with  metastatic neuroendocrine tumor with metastasis to the spine.  The pathology currently appears to be consistent with  the neuroendocrine tumor which was previously resected from the patient's rectum.  Today we discussed the nature of metastatic neuroendocrine tumor and the treatment options moving forward.  Fortunately this is a slow-growing tumor and observation would be an appropriate treatment option.  I noted that we could do serial imaging to monitor the progression of the tumor and determine if treatment was required.  If the patient wanted to proceed with treatment we could consider lanreotide or octreotide subcu q. 28 days for control of the disease.  I noted that the side effects that could be expected would be nausea, vomiting, diarrhea, abdominal pain, and injection site reactions.  The patient noted that she would prefer to proceed with treatment.  We will plan to start lanreotide 120 mg subcu q. 28 days with a follow-up visit in 1 month's time and interval imaging approximately 3 months from the start of treatment.  The patient voiced her understanding of this plan and was eager to move forward.  # Metastatic Neuroendocrine Tumor, Metastasis to the Spine (Ki-67 pending) --discussed with patient that the options would include observation with serial imaging or starting lanreotide therapy. The patient opted to start treatment --will plan for lanreotide 123m subq q28 days to tentatively start on 12/27/2019. This will be continued until progression or intolerance to therapy --plan for repeat imaging (CT C/A/P) 3 months from start of treatment (late Sept 2021) --RTC 1 month after start of therapy.   No orders of the defined types were placed in this encounter.  All questions were answered. The patient knows to call the clinic with any problems, questions or concerns.  A total of more than 40 minutes were spent on this encounter and over half of that time was spent on counseling and coordination  of care as outlined above.   JLedell Peoples MD [facility] of [facility]ology/Oncology [facility]Phone: [phone]: [phone]: [email or web address]  12/20/2019 1:16 PM

## 2019-12-20 ENCOUNTER — Inpatient Hospital Stay: Payer: 59 | Attending: Hematology and Oncology | Admitting: Hematology and Oncology

## 2019-12-20 ENCOUNTER — Other Ambulatory Visit: Payer: Self-pay

## 2019-12-20 ENCOUNTER — Encounter: Payer: Self-pay | Admitting: Hematology and Oncology

## 2019-12-20 ENCOUNTER — Telehealth: Payer: Self-pay | Admitting: Internal Medicine

## 2019-12-20 VITALS — BP 124/93 | HR 96 | Temp 97.7°F | Resp 18 | Ht 63.0 in | Wt 189.2 lb

## 2019-12-20 DIAGNOSIS — Z7189 Other specified counseling: Secondary | ICD-10-CM | POA: Diagnosis not present

## 2019-12-20 DIAGNOSIS — C7B8 Other secondary neuroendocrine tumors: Secondary | ICD-10-CM | POA: Diagnosis not present

## 2019-12-20 DIAGNOSIS — Z79899 Other long term (current) drug therapy: Secondary | ICD-10-CM | POA: Diagnosis not present

## 2019-12-20 DIAGNOSIS — I10 Essential (primary) hypertension: Secondary | ICD-10-CM | POA: Insufficient documentation

## 2019-12-20 DIAGNOSIS — F419 Anxiety disorder, unspecified: Secondary | ICD-10-CM | POA: Diagnosis not present

## 2019-12-20 DIAGNOSIS — C7951 Secondary malignant neoplasm of bone: Secondary | ICD-10-CM | POA: Insufficient documentation

## 2019-12-20 DIAGNOSIS — J45909 Unspecified asthma, uncomplicated: Secondary | ICD-10-CM | POA: Insufficient documentation

## 2019-12-20 DIAGNOSIS — C7A8 Other malignant neuroendocrine tumors: Secondary | ICD-10-CM | POA: Insufficient documentation

## 2019-12-20 DIAGNOSIS — R222 Localized swelling, mass and lump, trunk: Secondary | ICD-10-CM | POA: Insufficient documentation

## 2019-12-20 DIAGNOSIS — F17210 Nicotine dependence, cigarettes, uncomplicated: Secondary | ICD-10-CM | POA: Diagnosis not present

## 2019-12-20 NOTE — Progress Notes (Addendum)
START ON PATHWAY REGIMEN - Neuroendocrine     A cycle is every 28 days:     Lanreotide   **Always confirm dose/schedule in your pharmacy ordering system**  Patient Characteristics: GI Tract, Well-differentiated, Low-grade or Intermediate-grade, Unresectable, Treatment Indicated, First Line Tumor Location: GI Tract Tumor Grade: Low-grade Line of Therapy: First Line Intent of Therapy: Non-Curative / Palliative Intent, Discussed with Patient 

## 2019-12-20 NOTE — Telephone Encounter (Signed)
New message:   1.Medication Requested: VITAMIN D-VITAMIN K PO 2. Pharmacy (Name, [facility]): [facility] [place], [address] AT [address] [place] 3. On Med List: Yes  4. Last Visit with PCP: 08/30/19  5. Next visit date with PCP:   Agent: Please be advised that RX refills may take up to 3 business days. We ask that you follow-up with your pharmacy.

## 2019-12-20 NOTE — Telephone Encounter (Signed)
Please change to OTC Vitamin D3 at 2000 units per day, indefinitely.  

## 2019-12-20 NOTE — Telephone Encounter (Signed)
Sent to Dr. John. 

## 2019-12-23 ENCOUNTER — Telehealth: Payer: Self-pay | Admitting: Hematology and Oncology

## 2019-12-23 NOTE — Telephone Encounter (Signed)
Scheduled per los. Called and spoke with patient. Confirmed appt 

## 2019-12-24 NOTE — Telephone Encounter (Signed)
Spoke with pt and informed her of Dr. Judi Cong instructions.

## 2019-12-26 ENCOUNTER — Other Ambulatory Visit: Payer: Self-pay | Admitting: Hematology and Oncology

## 2019-12-26 DIAGNOSIS — C7B8 Other secondary neuroendocrine tumors: Secondary | ICD-10-CM

## 2019-12-27 ENCOUNTER — Inpatient Hospital Stay: Payer: 59

## 2019-12-27 ENCOUNTER — Other Ambulatory Visit: Payer: Self-pay

## 2019-12-27 VITALS — BP 128/74 | HR 89 | Temp 98.4°F | Resp 20

## 2019-12-27 DIAGNOSIS — C7A8 Other malignant neuroendocrine tumors: Secondary | ICD-10-CM | POA: Diagnosis not present

## 2019-12-27 DIAGNOSIS — C7B8 Other secondary neuroendocrine tumors: Secondary | ICD-10-CM

## 2019-12-27 LAB — CBC WITH DIFFERENTIAL (CANCER CENTER ONLY)
Abs Immature Granulocytes: 0.01 10*3/uL (ref 0.00–0.07)
Basophils Absolute: 0 10*3/uL (ref 0.0–0.1)
Basophils Relative: 0 %
Eosinophils Absolute: 0.4 10*3/uL (ref 0.0–0.5)
Eosinophils Relative: 4 %
HCT: 36.8 % (ref 36.0–46.0)
Hemoglobin: 11.7 g/dL — ABNORMAL LOW (ref 12.0–15.0)
Immature Granulocytes: 0 %
Lymphocytes Relative: 51 %
Lymphs Abs: 4.2 10*3/uL — ABNORMAL HIGH (ref 0.7–4.0)
MCH: 25.9 pg — ABNORMAL LOW (ref 26.0–34.0)
MCHC: 31.8 g/dL (ref 30.0–36.0)
MCV: 81.6 fL (ref 80.0–100.0)
Monocytes Absolute: 0.7 10*3/uL (ref 0.1–1.0)
Monocytes Relative: 9 %
Neutro Abs: 2.9 10*3/uL (ref 1.7–7.7)
Neutrophils Relative %: 36 %
Platelet Count: 276 10*3/uL (ref 150–400)
RBC: 4.51 MIL/uL (ref 3.87–5.11)
RDW: 15.2 % (ref 11.5–15.5)
WBC Count: 8.2 10*3/uL (ref 4.0–10.5)
nRBC: 0 % (ref 0.0–0.2)

## 2019-12-27 LAB — CMP (CANCER CENTER ONLY)
ALT: 52 U/L — ABNORMAL HIGH (ref 0–44)
AST: 32 U/L (ref 15–41)
Albumin: 3.4 g/dL — ABNORMAL LOW (ref 3.5–5.0)
Alkaline Phosphatase: 109 U/L (ref 38–126)
Anion gap: 10 (ref 5–15)
BUN: 12 mg/dL (ref 6–20)
CO2: 23 mmol/L (ref 22–32)
Calcium: 9.2 mg/dL (ref 8.9–10.3)
Chloride: 108 mmol/L (ref 98–111)
Creatinine: 0.74 mg/dL (ref 0.44–1.00)
GFR, Est AFR Am: >60 mL/min (ref >60)
GFR, Estimated: >60 mL/min (ref >60)
Glucose, Bld: 104 mg/dL — ABNORMAL HIGH (ref 70–99)
Potassium: 3.8 mmol/L (ref 3.5–5.1)
Sodium: 141 mmol/L (ref 135–145)
Total Bilirubin: 0.3 mg/dL (ref 0.3–1.2)
Total Protein: 7.3 g/dL (ref 6.5–8.1)

## 2019-12-27 MED ORDER — LANREOTIDE ACETATE 120 MG/0.5ML ~~LOC~~ SOLN
120.0000 mg | Freq: Once | SUBCUTANEOUS | Status: AC
  Administered 2019-12-27: 120 mg via SUBCUTANEOUS
  Filled 2019-12-27: qty 120

## 2019-12-27 NOTE — Patient Instructions (Signed)
Lanreotide injection What is this medicine? LANREOTIDE (lan REE oh tide) is used to reduce blood levels of growth hormone in patients with a condition called acromegaly. It also works to slow or stop tumor growth in patients with neuroendocrine tumors and treat carcinoid syndrome. This medicine may be used for other purposes; ask your health care provider or pharmacist if you have questions. COMMON BRAND NAME(S): Somatuline Depot What should I tell my health care provider before I take this medicine? They need to know if you have any of these conditions:  diabetes  gallbladder disease  heart disease  kidney disease  liver disease  thyroid disease  an unusual or allergic reaction to lanreotide, other medicines, foods, dyes, or preservatives  pregnant or trying to get pregnant  breast-feeding How should I use this medicine? This medicine is for injection under the skin. It is given by a health care professional in a hospital or clinic setting. Contact your pediatrician or health care professional regarding the use of this medicine in children. Special care may be needed. Overdosage: If you think you have taken too much of this medicine contact a poison control center or emergency room at once. NOTE: This medicine is only for you. Do not share this medicine with others. What if I miss a dose? It is important not to miss your dose. Call your doctor or health care professional if you are unable to keep an appointment. What may interact with this medicine? This medicine may interact with the following medications:  bromocriptine  cyclosporine  certain medicines for blood pressure, heart disease, irregular heart beat  certain medicines for diabetes  quinidine  terfenadine This list may not describe all possible interactions. Give your health care provider a list of all the medicines, herbs, non-prescription drugs, or dietary supplements you use. Also tell them if you smoke,  drink alcohol, or use illegal drugs. Some items may interact with your medicine. What should I watch for while using this medicine? Tell your doctor or healthcare professional if your symptoms do not start to get better or if they get worse. Visit your doctor or health care professional for regular checks on your progress. Your condition will be monitored carefully while you are receiving this medicine. This medicine may increase blood sugar. Ask your healthcare provider if changes in diet or medicines are needed if you have diabetes. You may need blood work done while you are taking this medicine. Women should inform their doctor if they wish to become pregnant or think they might be pregnant. There is a potential for serious side effects to an unborn child. Talk to your health care professional or pharmacist for more information. Do not breast-feed an infant while taking this medicine or for 6 months after stopping it. This medicine has caused ovarian failure in some women. This medicine may interfere with the ability to have a child. Talk with your doctor or health care professional if you are concerned about your fertility. What side effects may I notice from receiving this medicine? Side effects that you should report to your doctor or health care professional as soon as possible:  allergic reactions like skin rash, itching or hives, swelling of the face, lips, or tongue  increased blood pressure  severe stomach pain  signs and symptoms of hgh blood sugar such as being more thirsty or hungry or having to urinate more than normal. You may also feel very tired or have blurry vision.  signs and symptoms of low blood   sugar such as feeling anxious; confusion; dizziness; increased hunger; unusually weak or tired; sweating; shakiness; cold; irritable; headache; blurred vision; fast heartbeat; loss of consciousness  unusually slow heartbeat Side effects that usually do not require medical  attention (report to your doctor or health care professional if they continue or are bothersome):  constipation  diarrhea  dizziness  headache  muscle pain  muscle spasms  nausea  pain, redness, or irritation at site where injected This list may not describe all possible side effects. Call your doctor for medical advice about side effects. You may report side effects to FDA at 1-800-FDA-1088. Where should I keep my medicine? This drug is given in a hospital or clinic and will not be stored at home. NOTE: This sheet is a summary. It may not cover all possible information. If you have questions about this medicine, talk to your doctor, pharmacist, or health care provider.  2020 Elsevier/Gold Standard (2018-03-29 09:13:08)  

## 2019-12-31 LAB — CHROMOGRANIN A: Chromogranin A (ng/mL): 38.8 ng/mL (ref 0.0–101.8)

## 2020-01-14 ENCOUNTER — Other Ambulatory Visit: Payer: Self-pay | Admitting: Internal Medicine

## 2020-01-14 NOTE — Telephone Encounter (Signed)
Sorry - Please refill as per office routine med refill policy (all routine meds refilled for 3 mo or monthly per pt preference up to one year from last visit, then month to month grace period for 3 mo, then further med refills will have to be denied)

## 2020-01-14 NOTE — Telephone Encounter (Signed)
Medical screening examination/treatment/procedure(s) were performed by non-physician practitioner and as supervising physician I was immediately available for consultation/collaboration. I agree with above. Tyreon Frigon, MD   

## 2020-01-22 ENCOUNTER — Other Ambulatory Visit: Payer: Self-pay | Admitting: Internal Medicine

## 2020-01-22 DIAGNOSIS — F32A Depression, unspecified: Secondary | ICD-10-CM

## 2020-01-23 ENCOUNTER — Other Ambulatory Visit: Payer: Self-pay | Admitting: Hematology and Oncology

## 2020-01-23 DIAGNOSIS — C7B8 Other secondary neuroendocrine tumors: Secondary | ICD-10-CM

## 2020-01-24 ENCOUNTER — Inpatient Hospital Stay (HOSPITAL_BASED_OUTPATIENT_CLINIC_OR_DEPARTMENT_OTHER): Payer: 59 | Admitting: Hematology and Oncology

## 2020-01-24 ENCOUNTER — Inpatient Hospital Stay: Payer: 59 | Attending: Hematology and Oncology

## 2020-01-24 ENCOUNTER — Encounter: Payer: Self-pay | Admitting: Hematology and Oncology

## 2020-01-24 ENCOUNTER — Other Ambulatory Visit: Payer: Self-pay

## 2020-01-24 ENCOUNTER — Inpatient Hospital Stay: Payer: 59

## 2020-01-24 VITALS — BP 133/92 | HR 81 | Temp 97.8°F | Resp 18 | Ht 63.0 in | Wt 189.8 lb

## 2020-01-24 DIAGNOSIS — C7B8 Other secondary neuroendocrine tumors: Secondary | ICD-10-CM

## 2020-01-24 DIAGNOSIS — Z79899 Other long term (current) drug therapy: Secondary | ICD-10-CM | POA: Diagnosis not present

## 2020-01-24 DIAGNOSIS — C7951 Secondary malignant neoplasm of bone: Secondary | ICD-10-CM | POA: Insufficient documentation

## 2020-01-24 DIAGNOSIS — M899 Disorder of bone, unspecified: Secondary | ICD-10-CM

## 2020-01-24 DIAGNOSIS — C7A8 Other malignant neuroendocrine tumors: Secondary | ICD-10-CM | POA: Insufficient documentation

## 2020-01-24 LAB — CMP (CANCER CENTER ONLY)
ALT: 15 U/L (ref 0–44)
AST: 17 U/L (ref 15–41)
Albumin: 3.6 g/dL (ref 3.5–5.0)
Alkaline Phosphatase: 103 U/L (ref 38–126)
Anion gap: 12 (ref 5–15)
BUN: 12 mg/dL (ref 6–20)
CO2: 27 mmol/L (ref 22–32)
Calcium: 10 mg/dL (ref 8.9–10.3)
Chloride: 104 mmol/L (ref 98–111)
Creatinine: 0.86 mg/dL (ref 0.44–1.00)
GFR, Est AFR Am: >60 mL/min (ref >60)
GFR, Estimated: >60 mL/min (ref >60)
Glucose, Bld: 117 mg/dL — ABNORMAL HIGH (ref 70–99)
Potassium: 3.8 mmol/L (ref 3.5–5.1)
Sodium: 143 mmol/L (ref 135–145)
Total Bilirubin: 0.3 mg/dL (ref 0.3–1.2)
Total Protein: 7.3 g/dL (ref 6.5–8.1)

## 2020-01-24 LAB — CBC WITH DIFFERENTIAL (CANCER CENTER ONLY)
Abs Immature Granulocytes: 0.01 10*3/uL (ref 0.00–0.07)
Basophils Absolute: 0 10*3/uL (ref 0.0–0.1)
Basophils Relative: 0 %
Eosinophils Absolute: 0.2 10*3/uL (ref 0.0–0.5)
Eosinophils Relative: 2 %
HCT: 38.5 % (ref 36.0–46.0)
Hemoglobin: 12.6 g/dL (ref 12.0–15.0)
Immature Granulocytes: 0 %
Lymphocytes Relative: 49 %
Lymphs Abs: 3.6 10*3/uL (ref 0.7–4.0)
MCH: 27.3 pg (ref 26.0–34.0)
MCHC: 32.7 g/dL (ref 30.0–36.0)
MCV: 83.3 fL (ref 80.0–100.0)
Monocytes Absolute: 0.8 10*3/uL (ref 0.1–1.0)
Monocytes Relative: 10 %
Neutro Abs: 3 10*3/uL (ref 1.7–7.7)
Neutrophils Relative %: 39 %
Platelet Count: 239 10*3/uL (ref 150–400)
RBC: 4.62 MIL/uL (ref 3.87–5.11)
RDW: 14.6 % (ref 11.5–15.5)
WBC Count: 7.5 10*3/uL (ref 4.0–10.5)
nRBC: 0 % (ref 0.0–0.2)

## 2020-01-24 MED ORDER — LANREOTIDE ACETATE 120 MG/0.5ML ~~LOC~~ SOLN
120.0000 mg | Freq: Once | SUBCUTANEOUS | Status: AC
  Administered 2020-01-24: 120 mg via SUBCUTANEOUS
  Filled 2020-01-24: qty 120

## 2020-01-24 NOTE — Progress Notes (Signed)
[place] [phone]   Fax:([phone]  PROGRESS NOTE  Patient Care Team: Biagio Borg, MD as PCP - General  Hematological/Oncological History # Metastatic Neuroendocrine Tumor, Metastasis to the Spine (Ki-67 pending) 1) 08/26/2019: CT Abdomen Pelvis performed due to RUQ abdominal pain. Imaging showed a soft tissue mass abutting the right posterior eighth rib 2) 09/06/2019: CT chest performed which showed Ill-defined soft tissue nodule along the medial right posterior thoracic wall at the 8th intercostal space to the right of the spine. MRI was recommended. 3) 10/19/2019: MRI Thoracic spine showed enhancing lesions throughout the visualized spine consistent with metastatic disease. Additionally there was a 1.5 cm soft tissue nodule in the posteromedial right eighth intercostal space, more concerning for a metastasis 4) 10/25/2019: establish care with Dr. Lorenso Courier   5) 11/05/2019: attempted biopsy of soft tissue mass at right posterior 8th rib, but lesion had dissipated at time of biopsy attempt 6) 11/27/2019: PET CT scan performed showing right inguinal lymph nodes and osseous lesions, indicative of metastatic disease of unknown primary. 7) 12/11/2019: CT bone marrow biopsy showed metastatic neuroendocrine neoplasm 8) 12/27/2019: start of lanreotide 120m sub q28 days   #Low Grade Neuroendocrine Tumor 1) 2006: reportedly had rectal carcinoid tumor removed 2) 08/14/2008: patient had a flexibile sigmoidoscopy with endoscopic ultrasound which resected an 838m subepithelial lesion in rectum. Findings consistent with low grade neuroendocrine tumor. 3) 04/08/2010: repeat colonoscopy, no residual tumor. Repeat recommended in 2016.  4) 12/11/2019: metastatic recurrence found on biopsy, noted above.   Interval History:  Michelle CaStegman75.o. female with medical history significant for metastatic neuroendocrine tumor of the colon who presents for a follow up visit. The patient's  last visit was on 12/20/2019 prior to the start of lanreotide therapy. In the interim since the last visit the patient has had one dose of lantreotide therapy.   On exam today [name] that she has been well since her last visit.  She reports that after shot 1 she was doing well until later in the evening when she began having diarrhea.  She notes that the diarrhea persisted through day 2, but had resolved by day 3.  She notes that there was some stomach pain accompanied with this, but she has not had any since days 1 through 3 of the shot.  She notes that she has not had any issues with nausea, or vomiting.  But she does endorse having some issues with back pain in her lower back.  She notes that otherwise she has been about the same with no changes in her weight and no other issues in the interim since her last visit.  She denies having any fevers, chills, sweats, shortness of breath, or chest pain.  A full 10 point ROS is listed below.  MEDICAL HISTORY:  Past Medical History:  Diagnosis Date  . Acute bronchitis 06/06/2010  . ALLERGIC RHINITIS 05/29/2007  . Allergy    seasonal  . Anal or rectal pain 06/23/2008  . ANEMIA-IRON DEFICIENCY 05/29/2007  . Anxiety   . ASTHMA 05/29/2007  . Asthma   . 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 CONJUNCTIVITIS, ALLERGIC 11/27/2008  . HEMORRHOIDS 06/16/2008  . HTN (hypertension)   . Hx of adenomatous colonic polyps 11/2019  . NIPPLE DISCHARGE 01/21/2010  . Other specified forms of hearing loss 10/08/2009  . Overweight(278.02) 05/29/2007  . RASH-NONVESICULAR 05/29/2007  . Wheezing 06/22/2010    SURGICAL HISTORY: Past Surgical History:  Procedure Laterality Date  .  COLONOSCOPY      SOCIAL HISTORY: Social History   Socioeconomic History  . Marital status: Married    Spouse name: Not on file  . Number of children: 3  . Years of education: Not on file  . Highest education level: Not on file  Occupational History  . Occupation:  CARD REPLACEMEN    Employer: [employer]  . Occupation: customer service  Tobacco Use  . Smoking status: Former Smoker    Packs/day: 0.50    Years: 40.00    Pack years: 20.00    Types: Cigarettes    Quit date: 10/22/2019    Years since quitting: 0.2  . Smokeless tobacco: Never Used  . Tobacco comment: 1 pack weekly.  started smoking at 59 years old  Vaping Use  . Vaping Use: Never used  Substance and Sexual Activity  . Alcohol use: Yes    Comment: ocassional  . Drug use: No  . Sexual activity: Not on file  Other Topics Concern  . Not on file  Social History Narrative   Daily caffeine use 3   Patient does not get regular exercise   Social Determinants of Health   Financial Resource Strain:   . Difficulty of Paying Living Expenses:   Food Insecurity:   . Worried About Charity fundraiser in the Last Year:   . Arboriculturist in the Last Year:   Transportation Needs:   . Film/video editor (Medical):   [name] Kitchen Lack of Transportation (Non-Medical):   Physical Activity:   . Days of Exercise per Week:   . Minutes of Exercise per Session:   Stress:   . Feeling of Stress :   Social Connections:   . Frequency of Communication with Friends and Family:   . Frequency of Social Gatherings with Friends and Family:   . Attends Religious Services:   . Active Member of Clubs or Organizations:   . Attends Archivist Meetings:   [name] Kitchen Marital Status:   Intimate Partner Violence:   . Fear of Current or Ex-Partner:   . Emotionally Abused:   [name] Kitchen Physically Abused:   . Sexually Abused:     FAMILY HISTORY: Family History  Problem Relation Age of Onset  . Allergies Sister   . Diabetes Sister   . Hypertension Sister   . Hypertension Mother   . Prostate cancer Father   . Hypertension Brother   . Hypertension Sister   . Diabetes Paternal Aunt   . Cancer Paternal Aunt        type unknown  . Prostate cancer Brother   . Stroke Maternal Aunt   . Hypertension Maternal Aunt    . Colon cancer Neg Hx   . Esophageal cancer Neg Hx   . Rectal cancer Neg Hx   . Stomach cancer Neg Hx     ALLERGIES:  has No Known Allergies.  MEDICATIONS:  Current Outpatient Medications  Medication Sig Dispense Refill  . ALPRAZolam (XANAX) 0.5 MG tablet Take 1 tablet (0.5 mg total) by mouth 2 (two) times daily as needed for anxiety or sleep. 60 tablet 2  . buPROPion (WELLBUTRIN XL) 300 MG 24 hr tablet TAKE 1 TABLET(300 MG) BY MOUTH DAILY 90 tablet 1  . cholecalciferol (VITAMIN D3) 25 MCG (1000 UNIT) tablet Take 1,000 Units by mouth daily.    [name] Kitchen escitalopram (LEXAPRO) 20 MG tablet Take 20 mg by mouth daily.    . hydrochlorothiazide (HYDRODIURIL) 12.5 MG tablet TAKE 1 TABLET(12.5 MG)  BY MOUTH DAILY 90 tablet 0  . hydrocortisone 2.5 % cream Apply topically 2 (two) times daily. (Patient not taking: Reported on 11/01/2019) 30 g 1  . iron polysaccharides (NU-IRON) 150 MG capsule Take 1 capsule (150 mg total) by mouth daily. 90 capsule 1  . loratadine (CLARITIN) 10 MG tablet Take 10 mg by mouth daily as needed for allergies.    [name] Kitchen oxyCODONE-acetaminophen (PERCOCET/ROXICET) 5-325 MG tablet Take 1 tablet by mouth every 6 (six) hours as needed for severe pain. 30 tablet 0  . Turmeric 500 MG CAPS Take 500 mg by mouth daily.    . vitamin B-12 (CYANOCOBALAMIN) 100 MCG tablet Take 100 mcg by mouth daily.    [name] Kitchen VITAMIN D-VITAMIN K PO Take 1 tablet by mouth daily.    [name] Kitchen zolpidem (AMBIEN) 10 MG tablet Take 1 tablet (10 mg total) by mouth at bedtime as needed for up to 30 days for sleep. 30 tablet 5   No current facility-administered medications for this visit.    REVIEW OF SYSTEMS:   Constitutional: ( - ) fevers, ( - )  chills , ( - ) night sweats Eyes: ( - ) blurriness of vision, ( - ) double vision, ( - ) watery eyes Ears, nose, mouth, throat, and face: ( - ) mucositis, ( - ) sore throat Respiratory: ( - ) cough, ( - ) dyspnea, ( - ) wheezes Cardiovascular: ( - ) palpitation, ( - ) chest discomfort,  ( - ) lower extremity swelling Gastrointestinal:  ( - ) nausea, ( - ) heartburn, ( - ) change in bowel habits Skin: ( - ) abnormal skin rashes Lymphatics: ( - ) new lymphadenopathy, ( - ) easy bruising Neurological: ( - ) numbness, ( - ) tingling, ( - ) new weaknesses Behavioral/Psych: ( - ) mood change, ( - ) new changes  All other systems were reviewed with the patient and are negative.  PHYSICAL EXAMINATION: ECOG PERFORMANCE STATUS: 0 - Asymptomatic  Vitals:   01/24/20 1040 01/24/20 1042  BP: (!) 145/105 (!) 133/92  Pulse: 81   Resp: 18   Temp: 97.8 F (36.6 C)   SpO2: 100%    Filed Weights   01/24/20 1040  Weight: 189 lb 12.8 oz (86.1 kg)    GENERAL: well appearing middle aged [place] American female in NAD  SKIN: skin color, texture, turgor are normal, no rashes or significant lesions EYES: conjunctiva are pink and non-injected, sclera clear LUNGS: clear to auscultation and percussion with normal breathing effort HEART: regular rate & rhythm and no murmurs and no lower extremity edema Musculoskeletal: no cyanosis of digits and no clubbing  PSYCH: alert & oriented x 3, fluent speech NEURO: no focal motor/sensory deficits  LABORATORY DATA:  I have reviewed the data as listed CBC Latest Ref Rng & Units 01/24/2020 12/27/2019 12/11/2019  WBC 4.0 - 10.5 K/uL 7.5 8.2 8.1  Hemoglobin 12.0 - 15.0 g/dL 12.6 11.7(L) 12.0  Hematocrit 36 - 46 % 38.5 36.8 37.7  Platelets 150 - 400 K/uL 239 276 325    CMP Latest Ref Rng & Units 12/27/2019 10/25/2019 08/28/2019  Glucose 70 - 99 mg/dL 104(H) 106(H) 97  BUN 6 - 20 mg/dL _0 Creatinine 0.44 - 1.00 mg/dL 0.74 0.74 0.71  Sodium 135 - 145 mmol/L 141 143 140  Potassium 3.5 - 5.1 mmol/L 3.8 4.0 3.7  Chloride 98 - 111 mmol/L 108 106 104  CO2 22 - 32 mmol/L _1 Calcium  8.9 - 10.3 mg/dL 9.2 9.2 9.6  Total Protein 6.5 - 8.1 g/dL 7.3 7.8 7.5  Total Bilirubin 0.3 - 1.2 mg/dL 0.3 0.3 0.3  Alkaline Phos 38 - 126 U/L 109 105 92  AST  15 - 41 U/L 32 18 22  ALT 0 - 44 U/L 52(H) 17 21     RADIOGRAPHIC STUDIES: I have personally reviewed the radiological images as listed and agreed with the findings in the report: increased FDG intake in several areas of the spine and inguinal lymph nodes.  No results found.  ASSESSMENT & PLAN Michelle Solomon 59 y.o. female with medical history significant for metastatic neuroendocrine tumor of the colon who presents for a follow up visit.  After review of the labs, review the pathology, and review the PET CT imaging the findings are most consistent with metastatic neuroendocrine tumor with metastasis to the spine.  The pathology currently appears to be consistent with the neuroendocrine tumor which was previously resected from the patient's rectum.  On exam today Ms. Lusk notes that she tolerated the first treatment of lanreotide well with only some mild diarrhea on day 1 and 2 of the treatment.  She notes that she is eager to proceed with treatment today and has no additional concerns.  Previously we discussed the nature of metastatic neuroendocrine tumor and the treatment options moving forward.  Fortunately this is a slow-growing tumor and observation would be an appropriate treatment option.  I noted that we could do serial imaging to monitor the progression of the tumor and determine if treatment was required.  If the patient wanted to proceed with treatment we could consider lanreotide or octreotide subcu q. 28 days for control of the disease.  I noted that the side effects that could be expected would be nausea, vomiting, diarrhea, abdominal pain, and injection site reactions.  The patient noted that she would prefer to proceed with treatment.  We will plan to start lanreotide 120 mg subcu q. 28 days with a follow-up visit in 1 month's time and interval imaging approximately 3 months from the start of treatment.  The patient voiced her understanding of this plan and was eager to move  forward.  # Metastatic Neuroendocrine Tumor, Metastasis to the Spine (Ki-67 pending) --previously discussed with patient that the options would include observation with serial imaging or starting lanreotide therapy. The patient opted to start treatment --will plan for continued lanreotide 140m subq q28 days. Started therapy on 12/27/2019. This will be continued until progression or intolerance to therapy --plan for repeat imaging (CT C/A/P) 3 months from start of treatment (late Sept 2021) --RTC in 2 month after PET CT scan is repeated.    No orders of the defined types were placed in this encounter.  All questions were answered. The patient knows to call the clinic with any problems, questions or concerns.  A total of more than 20 minutes were spent on this encounter and over half of that time was spent on counseling and coordination of care as outlined above.   JLedell Peoples MD Department of Hematology/Oncology [facility] HospitalPhone: [phone]: [phone]: [email or web address]  01/24/2020 10:53 AM

## 2020-01-29 ENCOUNTER — Ambulatory Visit: Payer: 59 | Admitting: Hematology and Oncology

## 2020-01-29 ENCOUNTER — Other Ambulatory Visit: Payer: 59

## 2020-02-03 ENCOUNTER — Telehealth: Payer: Self-pay | Admitting: Hematology and Oncology

## 2020-02-03 NOTE — Telephone Encounter (Signed)
Scheduled per los. Called and spoke with patient. Confirmed appt 

## 2020-02-21 ENCOUNTER — Inpatient Hospital Stay: Payer: 59 | Attending: Hematology and Oncology

## 2020-02-21 ENCOUNTER — Other Ambulatory Visit: Payer: Self-pay

## 2020-02-21 VITALS — BP 125/94 | HR 81 | Temp 97.9°F | Resp 18

## 2020-02-21 DIAGNOSIS — C7A8 Other malignant neuroendocrine tumors: Secondary | ICD-10-CM | POA: Diagnosis present

## 2020-02-21 DIAGNOSIS — Z79899 Other long term (current) drug therapy: Secondary | ICD-10-CM | POA: Diagnosis not present

## 2020-02-21 DIAGNOSIS — C7B8 Other secondary neuroendocrine tumors: Secondary | ICD-10-CM

## 2020-02-21 MED ORDER — LANREOTIDE ACETATE 120 MG/0.5ML ~~LOC~~ SOLN
120.0000 mg | Freq: Once | SUBCUTANEOUS | Status: AC
  Administered 2020-02-21: 120 mg via SUBCUTANEOUS
  Filled 2020-02-21: qty 120

## 2020-02-21 NOTE — Patient Instructions (Signed)
Lanreotide injection What is this medicine? LANREOTIDE (lan REE oh tide) is used to reduce blood levels of growth hormone in patients with a condition called acromegaly. It also works to slow or stop tumor growth in patients with neuroendocrine tumors and treat carcinoid syndrome. This medicine may be used for other purposes; ask your health care provider or pharmacist if you have questions. COMMON BRAND NAME(S): Somatuline Depot What should I tell my health care provider before I take this medicine? They need to know if you have any of these conditions:  diabetes  gallbladder disease  heart disease  kidney disease  liver disease  thyroid disease  an unusual or allergic reaction to lanreotide, other medicines, foods, dyes, or preservatives  pregnant or trying to get pregnant  breast-feeding How should I use this medicine? This medicine is for injection under the skin. It is given by a health care professional in a hospital or clinic setting. Contact your pediatrician or health care professional regarding the use of this medicine in children. Special care may be needed. Overdosage: If you think you have taken too much of this medicine contact a poison control center or emergency room at once. NOTE: This medicine is only for you. Do not share this medicine with others. What if I miss a dose? It is important not to miss your dose. Call your doctor or health care professional if you are unable to keep an appointment. What may interact with this medicine? This medicine may interact with the following medications:  bromocriptine  cyclosporine  certain medicines for blood pressure, heart disease, irregular heart beat  certain medicines for diabetes  quinidine  terfenadine This list may not describe all possible interactions. Give your health care provider a list of all the medicines, herbs, non-prescription drugs, or dietary supplements you use. Also tell them if you smoke,  drink alcohol, or use illegal drugs. Some items may interact with your medicine. What should I watch for while using this medicine? Tell your doctor or healthcare professional if your symptoms do not start to get better or if they get worse. Visit your doctor or health care professional for regular checks on your progress. Your condition will be monitored carefully while you are receiving this medicine. This medicine may increase blood sugar. Ask your healthcare provider if changes in diet or medicines are needed if you have diabetes. You may need blood work done while you are taking this medicine. Women should inform their doctor if they wish to become pregnant or think they might be pregnant. There is a potential for serious side effects to an unborn child. Talk to your health care professional or pharmacist for more information. Do not breast-feed an infant while taking this medicine or for 6 months after stopping it. This medicine has caused ovarian failure in some women. This medicine may interfere with the ability to have a child. Talk with your doctor or health care professional if you are concerned about your fertility. What side effects may I notice from receiving this medicine? Side effects that you should report to your doctor or health care professional as soon as possible:  allergic reactions like skin rash, itching or hives, swelling of the face, lips, or tongue  increased blood pressure  severe stomach pain  signs and symptoms of hgh blood sugar such as being more thirsty or hungry or having to urinate more than normal. You may also feel very tired or have blurry vision.  signs and symptoms of low blood   sugar such as feeling anxious; confusion; dizziness; increased hunger; unusually weak or tired; sweating; shakiness; cold; irritable; headache; blurred vision; fast heartbeat; loss of consciousness  unusually slow heartbeat Side effects that usually do not require medical  attention (report to your doctor or health care professional if they continue or are bothersome):  constipation  diarrhea  dizziness  headache  muscle pain  muscle spasms  nausea  pain, redness, or irritation at site where injected This list may not describe all possible side effects. Call your doctor for medical advice about side effects. You may report side effects to FDA at 1-800-FDA-1088. Where should I keep my medicine? This drug is given in a hospital or clinic and will not be stored at home. NOTE: This sheet is a summary. It may not cover all possible information. If you have questions about this medicine, talk to your doctor, pharmacist, or health care provider.  2020 Elsevier/Gold Standard (2018-03-29 09:13:08)  

## 2020-03-20 ENCOUNTER — Other Ambulatory Visit: Payer: Self-pay | Admitting: *Deleted

## 2020-03-20 ENCOUNTER — Other Ambulatory Visit: Payer: Self-pay

## 2020-03-20 ENCOUNTER — Telehealth: Payer: Self-pay | Admitting: *Deleted

## 2020-03-20 ENCOUNTER — Inpatient Hospital Stay: Payer: 59

## 2020-03-20 ENCOUNTER — Inpatient Hospital Stay: Payer: 59 | Attending: Hematology and Oncology

## 2020-03-20 VITALS — BP 145/93 | HR 85 | Temp 98.1°F | Resp 18

## 2020-03-20 DIAGNOSIS — C7A8 Other malignant neuroendocrine tumors: Secondary | ICD-10-CM | POA: Insufficient documentation

## 2020-03-20 DIAGNOSIS — C7B8 Other secondary neuroendocrine tumors: Secondary | ICD-10-CM

## 2020-03-20 DIAGNOSIS — Z79899 Other long term (current) drug therapy: Secondary | ICD-10-CM | POA: Insufficient documentation

## 2020-03-20 LAB — CMP (CANCER CENTER ONLY)
ALT: 22 U/L (ref 0–44)
AST: 20 U/L (ref 15–41)
Albumin: 3.7 g/dL (ref 3.5–5.0)
Alkaline Phosphatase: 99 U/L (ref 38–126)
Anion gap: 8 (ref 5–15)
BUN: 9 mg/dL (ref 6–20)
CO2: 29 mmol/L (ref 22–32)
Calcium: 9.4 mg/dL (ref 8.9–10.3)
Chloride: 104 mmol/L (ref 98–111)
Creatinine: 0.78 mg/dL (ref 0.44–1.00)
GFR, Est AFR Am: >60 mL/min (ref >60)
GFR, Estimated: >60 mL/min (ref >60)
Glucose, Bld: 111 mg/dL — ABNORMAL HIGH (ref 70–99)
Potassium: 3.6 mmol/L (ref 3.5–5.1)
Sodium: 141 mmol/L (ref 135–145)
Total Bilirubin: 0.4 mg/dL (ref 0.3–1.2)
Total Protein: 7.5 g/dL (ref 6.5–8.1)

## 2020-03-20 LAB — CBC WITH DIFFERENTIAL (CANCER CENTER ONLY)
Abs Immature Granulocytes: 0.02 10*3/uL (ref 0.00–0.07)
Basophils Absolute: 0 10*3/uL (ref 0.0–0.1)
Basophils Relative: 0 %
Eosinophils Absolute: 0.3 10*3/uL (ref 0.0–0.5)
Eosinophils Relative: 4 %
HCT: 40.2 % (ref 36.0–46.0)
Hemoglobin: 12.9 g/dL (ref 12.0–15.0)
Immature Granulocytes: 0 %
Lymphocytes Relative: 44 %
Lymphs Abs: 3.6 10*3/uL (ref 0.7–4.0)
MCH: 26.9 pg (ref 26.0–34.0)
MCHC: 32.1 g/dL (ref 30.0–36.0)
MCV: 83.9 fL (ref 80.0–100.0)
Monocytes Absolute: 0.7 10*3/uL (ref 0.1–1.0)
Monocytes Relative: 9 %
Neutro Abs: 3.5 10*3/uL (ref 1.7–7.7)
Neutrophils Relative %: 43 %
Platelet Count: 267 10*3/uL (ref 150–400)
RBC: 4.79 MIL/uL (ref 3.87–5.11)
RDW: 14.6 % (ref 11.5–15.5)
WBC Count: 8 10*3/uL (ref 4.0–10.5)
nRBC: 0 % (ref 0.0–0.2)

## 2020-03-20 MED ORDER — LANREOTIDE ACETATE 120 MG/0.5ML ~~LOC~~ SOLN
120.0000 mg | Freq: Once | SUBCUTANEOUS | Status: AC
  Administered 2020-03-20: 120 mg via SUBCUTANEOUS
  Filled 2020-03-20: qty 120

## 2020-03-20 NOTE — Telephone Encounter (Signed)
[facility] Radiology recommended a different type of PET Scan.  That new order was entered.  Prior authorization was obtained for new order.    Nuclear Medicine called after authorization was completed and questioned if she got the Lanreotide today.  They stated that the body needs a wash out period of two weeks so they would have to reschedule the PET for October 4th to allow time for that medication to be washed out.   Routed to MD, need to figure out if the visit with him on 04/01/2020 is necessary at this point due to the delay in PET scan.

## 2020-03-20 NOTE — Patient Instructions (Signed)
Lanreotide injection What is this medicine? LANREOTIDE (lan REE oh tide) is used to reduce blood levels of growth hormone in patients with a condition called acromegaly. It also works to slow or stop tumor growth in patients with neuroendocrine tumors and treat carcinoid syndrome. This medicine may be used for other purposes; ask your health care provider or pharmacist if you have questions. COMMON BRAND NAME(S): Somatuline Depot What should I tell my health care provider before I take this medicine? They need to know if you have any of these conditions:  diabetes  gallbladder disease  heart disease  kidney disease  liver disease  thyroid disease  an unusual or allergic reaction to lanreotide, other medicines, foods, dyes, or preservatives  pregnant or trying to get pregnant  breast-feeding How should I use this medicine? This medicine is for injection under the skin. It is given by a health care professional in a hospital or clinic setting. Contact your pediatrician or health care professional regarding the use of this medicine in children. Special care may be needed. Overdosage: If you think you have taken too much of this medicine contact a poison control center or emergency room at once. NOTE: This medicine is only for you. Do not share this medicine with others. What if I miss a dose? It is important not to miss your dose. Call your doctor or health care professional if you are unable to keep an appointment. What may interact with this medicine? This medicine may interact with the following medications:  bromocriptine  cyclosporine  certain medicines for blood pressure, heart disease, irregular heart beat  certain medicines for diabetes  quinidine  terfenadine This list may not describe all possible interactions. Give your health care provider a list of all the medicines, herbs, non-prescription drugs, or dietary supplements you use. Also tell them if you smoke,  drink alcohol, or use illegal drugs. Some items may interact with your medicine. What should I watch for while using this medicine? Tell your doctor or healthcare professional if your symptoms do not start to get better or if they get worse. Visit your doctor or health care professional for regular checks on your progress. Your condition will be monitored carefully while you are receiving this medicine. This medicine may increase blood sugar. Ask your healthcare provider if changes in diet or medicines are needed if you have diabetes. You may need blood work done while you are taking this medicine. Women should inform their doctor if they wish to become pregnant or think they might be pregnant. There is a potential for serious side effects to an unborn child. Talk to your health care professional or pharmacist for more information. Do not breast-feed an infant while taking this medicine or for 6 months after stopping it. This medicine has caused ovarian failure in some women. This medicine may interfere with the ability to have a child. Talk with your doctor or health care professional if you are concerned about your fertility. What side effects may I notice from receiving this medicine? Side effects that you should report to your doctor or health care professional as soon as possible:  allergic reactions like skin rash, itching or hives, swelling of the face, lips, or tongue  increased blood pressure  severe stomach pain  signs and symptoms of hgh blood sugar such as being more thirsty or hungry or having to urinate more than normal. You may also feel very tired or have blurry vision.  signs and symptoms of low blood   sugar such as feeling anxious; confusion; dizziness; increased hunger; unusually weak or tired; sweating; shakiness; cold; irritable; headache; blurred vision; fast heartbeat; loss of consciousness  unusually slow heartbeat Side effects that usually do not require medical  attention (report to your doctor or health care professional if they continue or are bothersome):  constipation  diarrhea  dizziness  headache  muscle pain  muscle spasms  nausea  pain, redness, or irritation at site where injected This list may not describe all possible side effects. Call your doctor for medical advice about side effects. You may report side effects to FDA at 1-800-FDA-1088. Where should I keep my medicine? This drug is given in a hospital or clinic and will not be stored at home. NOTE: This sheet is a summary. It may not cover all possible information. If you have questions about this medicine, talk to your doctor, pharmacist, or health care provider.  2020 Elsevier/Gold Standard (2018-03-29 09:13:08)  

## 2020-03-23 ENCOUNTER — Ambulatory Visit (HOSPITAL_COMMUNITY): Admission: RE | Admit: 2020-03-23 | Payer: 59 | Source: Ambulatory Visit

## 2020-03-31 ENCOUNTER — Other Ambulatory Visit: Payer: Self-pay | Admitting: Hematology and Oncology

## 2020-03-31 DIAGNOSIS — C7B8 Other secondary neuroendocrine tumors: Secondary | ICD-10-CM

## 2020-03-31 NOTE — Progress Notes (Signed)
[place] [phone]   [phone]  PROGRESS NOTE  Patient Care Team: Biagio Borg, MD as PCP - General  Hematological/Oncological History # Metastatic Neuroendocrine Tumor, Metastasis to the Spine (Ki-67 pending) 1) 08/26/2019: CT Abdomen Pelvis performed due to RUQ abdominal pain. Imaging showed a soft tissue mass abutting the right posterior eighth rib 2) 09/06/2019: CT chest performed which showed Ill-defined soft tissue nodule along the medial right posterior thoracic wall at the 8th intercostal space to the right of the spine. MRI was recommended. 3) 10/19/2019: MRI Thoracic spine showed enhancing lesions throughout the visualized spine consistent with metastatic disease. Additionally there was a 1.5 cm soft tissue nodule in the posteromedial right eighth intercostal space, more concerning for a metastasis 4) 10/25/2019: establish care with Dr. Lorenso Courier   5) 11/05/2019: attempted biopsy of soft tissue mass at right posterior 8th rib, but lesion had dissipated at time of biopsy attempt 6) 11/27/2019: PET CT scan performed showing right inguinal lymph nodes and osseous lesions, indicative of metastatic disease of unknown primary. 7) 12/11/2019: CT bone marrow biopsy showed metastatic neuroendocrine neoplasm 8) 12/27/2019: start of lanreotide 183m sub q28 days   #Low Grade Neuroendocrine Tumor 1) 2006: reportedly had rectal carcinoid tumor removed 2) 08/14/2008: patient had a flexibile sigmoidoscopy with endoscopic ultrasound which resected an 864m subepithelial lesion in rectum. Findings consistent with low grade neuroendocrine tumor. 3) 04/08/2010: repeat colonoscopy, no residual tumor. Repeat recommended in 2016.  4) 12/11/2019: metastatic recurrence found on biopsy, noted above.  5) 12/27/2019: start of lanreotide 1209mub q28 days   Interval History:  Michelle Solomon 59o. female with medical history significant for metastatic neuroendocrine tumor of the  colon who presents for a follow up visit. The patient's last visit was on 01/24/2020 to assure tolerance of lanreotide therapy. In the interim since the last visit the patient has had no hospitalizations or ED visits.   On exam today Michelle Solomon he has been well in the interim since her last visit.  She notes that she has not had any nausea or diarrhea with her lanreotide shots, though she did have some mild side effects with the initial shot she received in June 2021.  She notes that she does have some numbness and tingling in her right leg, but otherwise has been virtually asymptomatic.  She denies having any issues with fevers, chills, sweats, nausea, vomiting or diarrhea.  A full 10 point ROS is listed below.  MEDICAL HISTORY:  Past Medical History:  Diagnosis Date  . Acute bronchitis 06/06/2010  . ALLERGIC RHINITIS 05/29/2007  . Allergy    seasonal  . Anal or rectal pain 06/23/2008  . ANEMIA-IRON DEFICIENCY 05/29/2007  . Anxiety   . ASTHMA 05/29/2007  . Asthma   . 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 CONJUNCTIVITIS, ALLERGIC 11/27/2008  . HEMORRHOIDS 06/16/2008  . HTN (hypertension)   . Hx of adenomatous colonic polyps 11/2019  . NIPPLE DISCHARGE 01/21/2010  . Other specified forms of hearing loss 10/08/2009  . Overweight(278.02) 05/29/2007  . RASH-NONVESICULAR 05/29/2007  . Wheezing 06/22/2010    SURGICAL HISTORY: Past Surgical History:  Procedure Laterality Date  . COLONOSCOPY      SOCIAL HISTORY: Social History   Socioeconomic History  . Marital status: Married    Spouse name: Not on file  . Number of children: 3  . Years of education: Not on file  . Highest education level: Not on file  Occupational History  .  Occupation: CARD REPLACEMEN    Employer: [employer]  . Occupation: customer service  Tobacco Use  . Smoking status: Former Smoker    Packs/day: 0.50    Years: 40.00    Pack years: 20.00    Types: Cigarettes    Quit date: 10/22/2019     Years since quitting: 0.4  . Smokeless tobacco: Never Used  . Tobacco comment: 1 pack weekly.  started smoking at 59 years old  Vaping Use  . Vaping Use: Never used  Substance and Sexual Activity  . Alcohol use: Yes    Comment: ocassional  . Drug use: No  . Sexual activity: Not on file  Other Topics Concern  . Not on file  Social History Narrative   Daily caffeine use 3   Patient does not get regular exercise   Social Determinants of Health   Financial Resource Strain:   . Difficulty of Paying Living Expenses: Not on file  Food Insecurity:   . Worried About Charity fundraiser in the Last Year: Not on file  . Ran Out of Food in the Last Year: Not on file  Transportation Needs:   . Lack of Transportation (Medical): Not on file  . Lack of Transportation (Non-Medical): Not on file  Physical Activity:   . Days of Exercise per Week: Not on file  . Minutes of Exercise per Session: Not on file  Stress:   . Feeling of Stress : Not on file  Social Connections:   . Frequency of Communication with Friends and Family: Not on file  . Frequency of Social Gatherings with Friends and Family: Not on file  . Attends Religious Services: Not on file  . Active Member of Clubs or Organizations: Not on file  . Attends Archivist Meetings: Not on file  . Marital Status: Not on file  Intimate Partner Violence:   . Fear of Current or Ex-Partner: Not on file  . Emotionally Abused: Not on file  . Physically Abused: Not on file  . Sexually Abused: Not on file    FAMILY HISTORY: Family History  Problem Relation Age of Onset  . Allergies Sister   . Diabetes Sister   . Hypertension Sister   . Hypertension Mother   . Prostate cancer Father   . Hypertension Brother   . Hypertension Sister   . Diabetes Paternal Aunt   . Cancer Paternal Aunt        type unknown  . Prostate cancer Brother   . Stroke Maternal Aunt   . Hypertension Maternal Aunt   . Colon cancer Neg Hx   .  Esophageal cancer Neg Hx   . Rectal cancer Neg Hx   . Stomach cancer Neg Hx     ALLERGIES:  has No Known Allergies.  MEDICATIONS:  Current Outpatient Medications  Medication Sig Dispense Refill  . ALPRAZolam (XANAX) 0.5 MG tablet Take 1 tablet (0.5 mg total) by mouth 2 (two) times daily as needed for anxiety or sleep. 60 tablet 2  . buPROPion (WELLBUTRIN XL) 300 MG 24 hr tablet TAKE 1 TABLET(300 MG) BY MOUTH DAILY 90 tablet 1  . cholecalciferol (VITAMIN D3) 25 MCG (1000 UNIT) tablet Take 1,000 Units by mouth daily.    [name] Kitchen escitalopram (LEXAPRO) 20 MG tablet Take 20 mg by mouth daily.    . hydrochlorothiazide (HYDRODIURIL) 12.5 MG tablet TAKE 1 TABLET(12.5 MG) BY MOUTH DAILY 90 tablet 0  . hydrocortisone 2.5 % cream Apply topically 2 (two) times daily. (  Patient not taking: Reported on 11/01/2019) 30 g 1  . iron polysaccharides (NU-IRON) 150 MG capsule Take 1 capsule (150 mg total) by mouth daily. 90 capsule 1  . loratadine (CLARITIN) 10 MG tablet Take 10 mg by mouth daily as needed for allergies.    [name] Kitchen oxyCODONE-acetaminophen (PERCOCET/ROXICET) 5-325 MG tablet Take 1 tablet by mouth every 6 (six) hours as needed for severe pain. 30 tablet 0  . Turmeric 500 MG CAPS Take 500 mg by mouth daily.    . vitamin B-12 (CYANOCOBALAMIN) 100 MCG tablet Take 100 mcg by mouth daily.    [name] Kitchen VITAMIN D-VITAMIN K PO Take 1 tablet by mouth daily.    [name] Kitchen zolpidem (AMBIEN) 10 MG tablet Take 1 tablet (10 mg total) by mouth at bedtime as needed for up to 30 days for sleep. 30 tablet 5   No current facility-administered medications for this visit.    REVIEW OF SYSTEMS:   Constitutional: ( - ) fevers, ( - )  chills , ( - ) night sweats Eyes: ( - ) blurriness of vision, ( - ) double vision, ( - ) watery eyes Ears, nose, mouth, throat, and face: ( - ) mucositis, ( - ) sore throat Respiratory: ( - ) cough, ( - ) dyspnea, ( - ) wheezes Cardiovascular: ( - ) palpitation, ( - ) chest discomfort, ( - ) lower extremity  swelling Gastrointestinal:  ( - ) nausea, ( - ) heartburn, ( - ) change in bowel habits Skin: ( - ) abnormal skin rashes Lymphatics: ( - ) new lymphadenopathy, ( - ) easy bruising Neurological: ( - ) numbness, ( - ) tingling, ( - ) new weaknesses Behavioral/Psych: ( - ) mood change, ( - ) new changes  All other systems were reviewed with the patient and are negative.  PHYSICAL EXAMINATION: ECOG PERFORMANCE STATUS: 0 - Asymptomatic  Vitals:   04/01/20 1031  BP: (!) 154/104  Pulse: 82  Resp: 18  Temp: (!) 96.5 F (35.8 C)  SpO2: 100%   Filed Weights   04/01/20 1031  Weight: 192 lb 12.8 oz (87.5 kg)    GENERAL: well appearing middle aged [place] American female in NAD  SKIN: skin color, texture, turgor are normal, no rashes or significant lesions EYES: conjunctiva are pink and non-injected, sclera clear LUNGS: clear to auscultation and percussion with normal breathing effort HEART: regular rate & rhythm and no murmurs and no lower extremity edema Musculoskeletal: no cyanosis of digits and no clubbing  PSYCH: alert & oriented x 3, fluent speech NEURO: no focal motor/sensory deficits  LABORATORY DATA:  I have reviewed the data as listed CBC Latest Ref Rng & Units 04/01/2020 03/20/2020 01/24/2020  WBC 4.0 - 10.5 K/uL 6.8 8.0 7.5  Hemoglobin 12.0 - 15.0 g/dL 12.3 12.9 12.6  Hematocrit 36 - 46 % 37.8 40.2 38.5  Platelets 150 - 400 K/uL 262 267 239    CMP Latest Ref Rng & Units 04/01/2020 03/20/2020 01/24/2020  Glucose 70 - 99 mg/dL 107(H) 111(H) 117(H)  BUN 6 - 20 mg/dL _0 Creatinine 0.44 - 1.00 mg/dL 0.76 0.78 0.86  Sodium 135 - 145 mmol/L 141 141 143  Potassium 3.5 - 5.1 mmol/L 3.7 3.6 3.8  Chloride 98 - 111 mmol/L 103 104 104  CO2 22 - 32 mmol/L 32 29 27  Calcium 8.9 - 10.3 mg/dL 9.3 9.4 10.0  Total Protein 6.5 - 8.1 g/dL 7.3 7.5 7.3  Total Bilirubin 0.3 - 1.2 mg/dL <0.2(L)  0.4 0.3  Alkaline Phos 38 - 126 U/L 90 99 103  AST 15 - 41 U/L _0 ALT 0 - 44 U/L _1 RADIOGRAPHIC STUDIES: I have personally reviewed the radiological images as listed and agreed with the findings in the report: increased FDG intake in several areas of the spine and inguinal lymph nodes.  No results found.  ASSESSMENT & PLAN Michelle Solomon 59 y.o. female with medical history significant for metastatic neuroendocrine tumor of the colon who presents for a follow up visit.  After review of the labs, review the pathology, and review the PET CT imaging the findings are most consistent with metastatic neuroendocrine tumor with metastasis to the spine.  The pathology currently appears to be consistent with the neuroendocrine tumor which was previously resected from the patient's rectum.  On exam today Ms. Roscoe notes she is tolerating treatment well and ready and able to proceed with continued lanreotide therapy.  Her next dose is 04/17/2020.  Previously we discussed the nature of metastatic neuroendocrine tumor and the treatment options moving forward.  Fortunately this is a slow-growing tumor and observation would be an appropriate treatment option.  I noted that we could do serial imaging to monitor the progression of the tumor and determine if treatment was required.  If the patient wanted to proceed with treatment we could consider lanreotide or octreotide subcu q. 28 days for control of the disease.  I noted that the side effects that could be expected would be nausea, vomiting, diarrhea, abdominal pain, and injection site reactions.  The patient noted that she would prefer to proceed with treatment.  We will plan to continue lanreotide 120 mg subcu q. 28 days with interval imaging approximately 3 months from the start of treatment.  The patient voiced her understanding of this plan and was eager to move forward.  # Metastatic Neuroendocrine Tumor, Metastasis to the Spine (Ki-67 pending) --previously discussed with patient that the options would include observation with serial  imaging or starting lanreotide therapy. The patient opted to start treatment --will plan for continued lanreotide 175m subq q28 days. Started therapy on 12/27/2019. This will be continued until progression or intolerance to therapy. Next dose due on 04/17/2020 --PET CT scan to be performed on 04/06/2020 --RTC in 3 month for interval visit.    No orders of the defined types were placed in this encounter.  All questions were answered. The patient knows to call the clinic with any problems, questions or concerns.  A total of more than 30 minutes were spent on this encounter and over half of that time was spent on counseling and coordination of care as outlined above.   JLedell Peoples MD Department of Hematology/Oncology CMagnoliaat [facility]Val[facility] HospitalPhone: [phone]: [phone]: [email or web address]  04/01/2020 3:22 PM

## 2020-04-01 ENCOUNTER — Inpatient Hospital Stay (HOSPITAL_BASED_OUTPATIENT_CLINIC_OR_DEPARTMENT_OTHER): Payer: 59 | Admitting: Hematology and Oncology

## 2020-04-01 ENCOUNTER — Inpatient Hospital Stay: Payer: 59

## 2020-04-01 ENCOUNTER — Encounter: Payer: Self-pay | Admitting: Hematology and Oncology

## 2020-04-01 ENCOUNTER — Other Ambulatory Visit: Payer: Self-pay

## 2020-04-01 VITALS — BP 154/104 | HR 82 | Temp 96.5°F | Resp 18 | Ht 63.0 in | Wt 192.8 lb

## 2020-04-01 DIAGNOSIS — C7A8 Other malignant neuroendocrine tumors: Secondary | ICD-10-CM

## 2020-04-01 DIAGNOSIS — R918 Other nonspecific abnormal finding of lung field: Secondary | ICD-10-CM | POA: Diagnosis not present

## 2020-04-01 DIAGNOSIS — C7B8 Other secondary neuroendocrine tumors: Secondary | ICD-10-CM | POA: Diagnosis not present

## 2020-04-01 LAB — CBC WITH DIFFERENTIAL (CANCER CENTER ONLY)
Abs Immature Granulocytes: 0.01 10*3/uL (ref 0.00–0.07)
Basophils Absolute: 0 10*3/uL (ref 0.0–0.1)
Basophils Relative: 0 %
Eosinophils Absolute: 0.2 10*3/uL (ref 0.0–0.5)
Eosinophils Relative: 4 %
HCT: 37.8 % (ref 36.0–46.0)
Hemoglobin: 12.3 g/dL (ref 12.0–15.0)
Immature Granulocytes: 0 %
Lymphocytes Relative: 48 %
Lymphs Abs: 3.2 10*3/uL (ref 0.7–4.0)
MCH: 27.2 pg (ref 26.0–34.0)
MCHC: 32.5 g/dL (ref 30.0–36.0)
MCV: 83.6 fL (ref 80.0–100.0)
Monocytes Absolute: 0.6 10*3/uL (ref 0.1–1.0)
Monocytes Relative: 9 %
Neutro Abs: 2.7 10*3/uL (ref 1.7–7.7)
Neutrophils Relative %: 39 %
Platelet Count: 262 10*3/uL (ref 150–400)
RBC: 4.52 MIL/uL (ref 3.87–5.11)
RDW: 14.5 % (ref 11.5–15.5)
WBC Count: 6.8 10*3/uL (ref 4.0–10.5)
nRBC: 0 % (ref 0.0–0.2)

## 2020-04-01 LAB — CMP (CANCER CENTER ONLY)
ALT: 18 U/L (ref 0–44)
AST: 16 U/L (ref 15–41)
Albumin: 3.7 g/dL (ref 3.5–5.0)
Alkaline Phosphatase: 90 U/L (ref 38–126)
Anion gap: 6 (ref 5–15)
BUN: 8 mg/dL (ref 6–20)
CO2: 32 mmol/L (ref 22–32)
Calcium: 9.3 mg/dL (ref 8.9–10.3)
Chloride: 103 mmol/L (ref 98–111)
Creatinine: 0.76 mg/dL (ref 0.44–1.00)
GFR, Est AFR Am: >60 mL/min (ref >60)
GFR, Estimated: >60 mL/min (ref >60)
Glucose, Bld: 107 mg/dL — ABNORMAL HIGH (ref 70–99)
Potassium: 3.7 mmol/L (ref 3.5–5.1)
Sodium: 141 mmol/L (ref 135–145)
Total Bilirubin: <0.2 mg/dL — ABNORMAL LOW (ref 0.3–1.2)
Total Protein: 7.3 g/dL (ref 6.5–8.1)

## 2020-04-03 LAB — CHROMOGRANIN A: Chromogranin A (ng/mL): 24.6 ng/mL (ref 0.0–101.8)

## 2020-04-06 ENCOUNTER — Other Ambulatory Visit: Payer: Self-pay

## 2020-04-06 ENCOUNTER — Ambulatory Visit (HOSPITAL_COMMUNITY)
Admission: RE | Admit: 2020-04-06 | Discharge: 2020-04-06 | Disposition: A | Discharge status: Home / Self Care | Payer: 59 | Source: Ambulatory Visit | Attending: Hematology and Oncology | Admitting: Hematology and Oncology

## 2020-04-06 ENCOUNTER — Telehealth: Payer: Self-pay | Admitting: Hematology and Oncology

## 2020-04-06 DIAGNOSIS — C7B8 Other secondary neuroendocrine tumors: Secondary | ICD-10-CM | POA: Insufficient documentation

## 2020-04-06 MED ORDER — COPPER CU 64 DOTATATE 1 MCI/ML IV SOLN
4.0000 | Freq: Once | INTRAVENOUS | Status: DC
  Administered 2020-04-06: 3.7 via INTRAVENOUS

## 2020-04-06 NOTE — Telephone Encounter (Signed)
Scheduled per los. Called and left msg. Mailed printout  °

## 2020-04-10 ENCOUNTER — Telehealth: Payer: Self-pay | Admitting: *Deleted

## 2020-04-10 NOTE — Telephone Encounter (Signed)
Received call from patient requesting a call back from Dr. Lorenso Courier regarding the results of her recent PET Scan - done 04/06/20  Dr. Lorenso Courier made aware.

## 2020-04-15 ENCOUNTER — Telehealth: Payer: Self-pay | Admitting: Hematology and Oncology

## 2020-04-15 NOTE — Telephone Encounter (Signed)
Called Michelle Solomon to discuss the results of her most recent scan.  Unfortunately we change scanning modalities and picked a test that was far more sensitive to neuroendocrine tumor.  There were additional spots noted, however after extensive review with the radiologist it was determined that there did not appear to be any progression and that several of the spots which were "new" on the most recent scan did show subtly on prior imaging.  As such I would recommend we continue with our current plan of lanreotide q. 28 days with a repeat scan in approximately 2 to 3 months in order to assure stability.  Michelle Peoples, MD Department of Hematology/Oncology [place] [facility] Phone: [phone] Pager: [phone] Email: [email or web address]

## 2020-04-16 ENCOUNTER — Other Ambulatory Visit: Payer: Self-pay | Admitting: Internal Medicine

## 2020-04-16 NOTE — Telephone Encounter (Signed)
Please refill as per office routine med refill policy (all routine meds refilled for 3 mo or monthly per pt preference up to one year from last visit, then month to month grace period for 3 mo, then further med refills will have to be denied)  

## 2020-04-17 ENCOUNTER — Inpatient Hospital Stay: Payer: 59 | Attending: Hematology and Oncology

## 2020-04-17 ENCOUNTER — Other Ambulatory Visit: Payer: Self-pay | Admitting: Hematology and Oncology

## 2020-04-17 ENCOUNTER — Inpatient Hospital Stay: Payer: 59

## 2020-04-17 ENCOUNTER — Other Ambulatory Visit: Payer: Self-pay

## 2020-04-17 ENCOUNTER — Ambulatory Visit: Payer: 59 | Admitting: Hematology and Oncology

## 2020-04-17 VITALS — BP 155/98 | HR 92 | Resp 18

## 2020-04-17 DIAGNOSIS — Z79899 Other long term (current) drug therapy: Secondary | ICD-10-CM | POA: Diagnosis not present

## 2020-04-17 DIAGNOSIS — C7A8 Other malignant neuroendocrine tumors: Secondary | ICD-10-CM

## 2020-04-17 DIAGNOSIS — C7B8 Other secondary neuroendocrine tumors: Secondary | ICD-10-CM

## 2020-04-17 LAB — CBC WITH DIFFERENTIAL (CANCER CENTER ONLY)
Abs Immature Granulocytes: 0.01 10*3/uL (ref 0.00–0.07)
Basophils Absolute: 0 10*3/uL (ref 0.0–0.1)
Basophils Relative: 0 %
Eosinophils Absolute: 0.2 10*3/uL (ref 0.0–0.5)
Eosinophils Relative: 3 %
HCT: 37 % (ref 36.0–46.0)
Hemoglobin: 12 g/dL (ref 12.0–15.0)
Immature Granulocytes: 0 %
Lymphocytes Relative: 52 %
Lymphs Abs: 3.9 10*3/uL (ref 0.7–4.0)
MCH: 27.2 pg (ref 26.0–34.0)
MCHC: 32.4 g/dL (ref 30.0–36.0)
MCV: 83.9 fL (ref 80.0–100.0)
Monocytes Absolute: 0.7 10*3/uL (ref 0.1–1.0)
Monocytes Relative: 9 %
Neutro Abs: 2.7 10*3/uL (ref 1.7–7.7)
Neutrophils Relative %: 36 %
Platelet Count: 256 10*3/uL (ref 150–400)
RBC: 4.41 MIL/uL (ref 3.87–5.11)
RDW: 14.6 % (ref 11.5–15.5)
WBC Count: 7.5 10*3/uL (ref 4.0–10.5)
nRBC: 0 % (ref 0.0–0.2)

## 2020-04-17 LAB — CMP (CANCER CENTER ONLY)
ALT: 13 U/L (ref 0–44)
AST: 14 U/L — ABNORMAL LOW (ref 15–41)
Albumin: 3.5 g/dL (ref 3.5–5.0)
Alkaline Phosphatase: 88 U/L (ref 38–126)
Anion gap: 3 — ABNORMAL LOW (ref 5–15)
BUN: 17 mg/dL (ref 6–20)
CO2: 32 mmol/L (ref 22–32)
Calcium: 9.4 mg/dL (ref 8.9–10.3)
Chloride: 109 mmol/L (ref 98–111)
Creatinine: 0.75 mg/dL (ref 0.44–1.00)
GFR, Estimated: >60 mL/min (ref >60)
Glucose, Bld: 96 mg/dL (ref 70–99)
Potassium: 4.1 mmol/L (ref 3.5–5.1)
Sodium: 144 mmol/L (ref 135–145)
Total Bilirubin: <0.2 mg/dL — ABNORMAL LOW (ref 0.3–1.2)
Total Protein: 6.8 g/dL (ref 6.5–8.1)

## 2020-04-17 MED ORDER — LANREOTIDE ACETATE 120 MG/0.5ML ~~LOC~~ SOLN
120.0000 mg | Freq: Once | SUBCUTANEOUS | Status: AC
  Administered 2020-04-17: 120 mg via SUBCUTANEOUS
  Filled 2020-04-17: qty 120

## 2020-04-28 ENCOUNTER — Other Ambulatory Visit: Payer: Self-pay | Admitting: Internal Medicine

## 2020-04-28 NOTE — Telephone Encounter (Signed)
Please refill as per office routine med refill policy (all routine meds refilled for 3 mo or monthly per pt preference up to one year from last visit, then month to month grace period for 3 mo, then further med refills will have to be denied)  

## 2020-04-30 ENCOUNTER — Other Ambulatory Visit: Payer: Self-pay | Admitting: Internal Medicine

## 2020-05-04 ENCOUNTER — Telehealth: Payer: Self-pay | Admitting: Internal Medicine

## 2020-05-04 NOTE — Telephone Encounter (Signed)
Patient is requesting a med refill for buPROPion (WELLBUTRIN XL) 300 MG 24 hr tablet  It can be sent to [facility], [address]    Please call the patient: [phone]

## 2020-05-05 ENCOUNTER — Other Ambulatory Visit: Payer: Self-pay

## 2020-05-05 DIAGNOSIS — F32A Depression, unspecified: Secondary | ICD-10-CM

## 2020-05-05 MED ORDER — BUPROPION HCL ER (XL) 300 MG PO TB24: ORAL_TABLET | ORAL | 1 refills | Status: DC

## 2020-05-06 ENCOUNTER — Other Ambulatory Visit: Payer: Self-pay | Admitting: Internal Medicine

## 2020-05-06 NOTE — Telephone Encounter (Signed)
Please refill as per office routine med refill policy (all routine meds refilled for 3 mo or monthly per pt preference up to one year from last visit, then month to month grace period for 3 mo, then further med refills will have to be denied)  

## 2020-05-15 ENCOUNTER — Other Ambulatory Visit: Payer: Self-pay | Admitting: Hematology and Oncology

## 2020-05-15 ENCOUNTER — Other Ambulatory Visit: Payer: Self-pay

## 2020-05-15 ENCOUNTER — Inpatient Hospital Stay: Payer: 59 | Attending: Hematology and Oncology

## 2020-05-15 ENCOUNTER — Inpatient Hospital Stay: Payer: 59

## 2020-05-15 VITALS — BP 151/90 | HR 78 | Resp 18

## 2020-05-15 DIAGNOSIS — C7B8 Other secondary neuroendocrine tumors: Secondary | ICD-10-CM

## 2020-05-15 DIAGNOSIS — Z79899 Other long term (current) drug therapy: Secondary | ICD-10-CM | POA: Insufficient documentation

## 2020-05-15 DIAGNOSIS — C7A8 Other malignant neuroendocrine tumors: Secondary | ICD-10-CM

## 2020-05-15 LAB — CBC WITH DIFFERENTIAL (CANCER CENTER ONLY)
Abs Immature Granulocytes: 0.01 10*3/uL (ref 0.00–0.07)
Basophils Absolute: 0 10*3/uL (ref 0.0–0.1)
Basophils Relative: 0 %
Eosinophils Absolute: 0.2 10*3/uL (ref 0.0–0.5)
Eosinophils Relative: 2 %
HCT: 39.9 % (ref 36.0–46.0)
Hemoglobin: 12.8 g/dL (ref 12.0–15.0)
Immature Granulocytes: 0 %
Lymphocytes Relative: 41 %
Lymphs Abs: 2.6 10*3/uL (ref 0.7–4.0)
MCH: 27.2 pg (ref 26.0–34.0)
MCHC: 32.1 g/dL (ref 30.0–36.0)
MCV: 84.9 fL (ref 80.0–100.0)
Monocytes Absolute: 0.5 10*3/uL (ref 0.1–1.0)
Monocytes Relative: 8 %
Neutro Abs: 3.1 10*3/uL (ref 1.7–7.7)
Neutrophils Relative %: 49 %
Platelet Count: 275 10*3/uL (ref 150–400)
RBC: 4.7 MIL/uL (ref 3.87–5.11)
RDW: 14.2 % (ref 11.5–15.5)
WBC Count: 6.3 10*3/uL (ref 4.0–10.5)
nRBC: 0 % (ref 0.0–0.2)

## 2020-05-15 LAB — CMP (CANCER CENTER ONLY)
ALT: 11 U/L (ref 0–44)
AST: 13 U/L — ABNORMAL LOW (ref 15–41)
Albumin: 4 g/dL (ref 3.5–5.0)
Alkaline Phosphatase: 93 U/L (ref 38–126)
Anion gap: 8 (ref 5–15)
BUN: 14 mg/dL (ref 6–20)
CO2: 27 mmol/L (ref 22–32)
Calcium: 9.5 mg/dL (ref 8.9–10.3)
Chloride: 103 mmol/L (ref 98–111)
Creatinine: 0.94 mg/dL (ref 0.44–1.00)
GFR, Estimated: >60 mL/min (ref >60)
Glucose, Bld: 204 mg/dL — ABNORMAL HIGH (ref 70–99)
Potassium: 3.9 mmol/L (ref 3.5–5.1)
Sodium: 138 mmol/L (ref 135–145)
Total Bilirubin: 0.6 mg/dL (ref 0.3–1.2)
Total Protein: 7.6 g/dL (ref 6.5–8.1)

## 2020-05-15 MED ORDER — LANREOTIDE ACETATE 120 MG/0.5ML ~~LOC~~ SOLN
120.0000 mg | Freq: Once | SUBCUTANEOUS | Status: AC
  Administered 2020-05-15: 120 mg via SUBCUTANEOUS
  Filled 2020-05-15: qty 120

## 2020-05-15 NOTE — Patient Instructions (Signed)
Lanreotide injection What is this medicine? LANREOTIDE (lan REE oh tide) is used to reduce blood levels of growth hormone in patients with a condition called acromegaly. It also works to slow or stop tumor growth in patients with neuroendocrine tumors and treat carcinoid syndrome. This medicine may be used for other purposes; ask your health care provider or pharmacist if you have questions. COMMON BRAND NAME(S): Somatuline Depot What should I tell my health care provider before I take this medicine? They need to know if you have any of these conditions:  diabetes  gallbladder disease  heart disease  kidney disease  liver disease  thyroid disease  an unusual or allergic reaction to lanreotide, other medicines, foods, dyes, or preservatives  pregnant or trying to get pregnant  breast-feeding How should I use this medicine? This medicine is for injection under the skin. It is given by a health care professional in a hospital or clinic setting. Contact your pediatrician or health care professional regarding the use of this medicine in children. Special care may be needed. Overdosage: If you think you have taken too much of this medicine contact a poison control center or emergency room at once. NOTE: This medicine is only for you. Do not share this medicine with others. What if I miss a dose? It is important not to miss your dose. Call your doctor or health care professional if you are unable to keep an appointment. What may interact with this medicine? This medicine may interact with the following medications:  bromocriptine  cyclosporine  certain medicines for blood pressure, heart disease, irregular heart beat  certain medicines for diabetes  quinidine  terfenadine This list may not describe all possible interactions. Give your health care provider a list of all the medicines, herbs, non-prescription drugs, or dietary supplements you use. Also tell them if you smoke,  drink alcohol, or use illegal drugs. Some items may interact with your medicine. What should I watch for while using this medicine? Tell your doctor or healthcare professional if your symptoms do not start to get better or if they get worse. Visit your doctor or health care professional for regular checks on your progress. Your condition will be monitored carefully while you are receiving this medicine. This medicine may increase blood sugar. Ask your healthcare provider if changes in diet or medicines are needed if you have diabetes. You may need blood work done while you are taking this medicine. Women should inform their doctor if they wish to become pregnant or think they might be pregnant. There is a potential for serious side effects to an unborn child. Talk to your health care professional or pharmacist for more information. Do not breast-feed an infant while taking this medicine or for 6 months after stopping it. This medicine has caused ovarian failure in some women. This medicine may interfere with the ability to have a child. Talk with your doctor or health care professional if you are concerned about your fertility. What side effects may I notice from receiving this medicine? Side effects that you should report to your doctor or health care professional as soon as possible:  allergic reactions like skin rash, itching or hives, swelling of the face, lips, or tongue  increased blood pressure  severe stomach pain  signs and symptoms of hgh blood sugar such as being more thirsty or hungry or having to urinate more than normal. You may also feel very tired or have blurry vision.  signs and symptoms of low blood   sugar such as feeling anxious; confusion; dizziness; increased hunger; unusually weak or tired; sweating; shakiness; cold; irritable; headache; blurred vision; fast heartbeat; loss of consciousness  unusually slow heartbeat Side effects that usually do not require medical  attention (report to your doctor or health care professional if they continue or are bothersome):  constipation  diarrhea  dizziness  headache  muscle pain  muscle spasms  nausea  pain, redness, or irritation at site where injected This list may not describe all possible side effects. Call your doctor for medical advice about side effects. You may report side effects to FDA at 1-800-FDA-1088. Where should I keep my medicine? This drug is given in a hospital or clinic and will not be stored at home. NOTE: This sheet is a summary. It may not cover all possible information. If you have questions about this medicine, talk to your doctor, pharmacist, or health care provider.  2020 Elsevier/Gold Standard (2018-03-29 09:13:08)  

## 2020-06-12 ENCOUNTER — Inpatient Hospital Stay: Payer: 59

## 2020-06-12 ENCOUNTER — Other Ambulatory Visit: Payer: Self-pay | Admitting: Hematology and Oncology

## 2020-06-12 ENCOUNTER — Inpatient Hospital Stay: Payer: 59 | Attending: Hematology and Oncology

## 2020-06-12 ENCOUNTER — Other Ambulatory Visit: Payer: Self-pay

## 2020-06-12 VITALS — BP 146/100 | HR 92 | Resp 18

## 2020-06-12 DIAGNOSIS — C7A8 Other malignant neuroendocrine tumors: Secondary | ICD-10-CM | POA: Insufficient documentation

## 2020-06-12 DIAGNOSIS — C7B8 Other secondary neuroendocrine tumors: Secondary | ICD-10-CM

## 2020-06-12 DIAGNOSIS — Z79899 Other long term (current) drug therapy: Secondary | ICD-10-CM | POA: Diagnosis not present

## 2020-06-12 LAB — CMP (CANCER CENTER ONLY)
ALT: 15 U/L (ref 0–44)
AST: 16 U/L (ref 15–41)
Albumin: 3.8 g/dL (ref 3.5–5.0)
Alkaline Phosphatase: 101 U/L (ref 38–126)
Anion gap: 8 (ref 5–15)
BUN: 18 mg/dL (ref 6–20)
CO2: 29 mmol/L (ref 22–32)
Calcium: 9.7 mg/dL (ref 8.9–10.3)
Chloride: 105 mmol/L (ref 98–111)
Creatinine: 0.93 mg/dL (ref 0.44–1.00)
GFR, Estimated: >60 mL/min (ref >60)
Glucose, Bld: 98 mg/dL (ref 70–99)
Potassium: 3.8 mmol/L (ref 3.5–5.1)
Sodium: 142 mmol/L (ref 135–145)
Total Bilirubin: 0.3 mg/dL (ref 0.3–1.2)
Total Protein: 7.5 g/dL (ref 6.5–8.1)

## 2020-06-12 LAB — CBC WITH DIFFERENTIAL (CANCER CENTER ONLY)
Abs Immature Granulocytes: 0.01 10*3/uL (ref 0.00–0.07)
Basophils Absolute: 0 10*3/uL (ref 0.0–0.1)
Basophils Relative: 0 %
Eosinophils Absolute: 0.4 10*3/uL (ref 0.0–0.5)
Eosinophils Relative: 5 %
HCT: 38.8 % (ref 36.0–46.0)
Hemoglobin: 12.4 g/dL (ref 12.0–15.0)
Immature Granulocytes: 0 %
Lymphocytes Relative: 37 %
Lymphs Abs: 3.1 10*3/uL (ref 0.7–4.0)
MCH: 27.4 pg (ref 26.0–34.0)
MCHC: 32 g/dL (ref 30.0–36.0)
MCV: 85.8 fL (ref 80.0–100.0)
Monocytes Absolute: 0.7 10*3/uL (ref 0.1–1.0)
Monocytes Relative: 8 %
Neutro Abs: 4.3 10*3/uL (ref 1.7–7.7)
Neutrophils Relative %: 50 %
Platelet Count: 280 10*3/uL (ref 150–400)
RBC: 4.52 MIL/uL (ref 3.87–5.11)
RDW: 13.7 % (ref 11.5–15.5)
WBC Count: 8.5 10*3/uL (ref 4.0–10.5)
nRBC: 0 % (ref 0.0–0.2)

## 2020-06-12 MED ORDER — LANREOTIDE ACETATE 120 MG/0.5ML ~~LOC~~ SOLN
120.0000 mg | Freq: Once | SUBCUTANEOUS | Status: AC
  Administered 2020-06-12: 120 mg via SUBCUTANEOUS
  Filled 2020-06-12: qty 120

## 2020-06-12 NOTE — Patient Instructions (Signed)
Lanreotide injection What is this medicine? LANREOTIDE (lan REE oh tide) is used to reduce blood levels of growth hormone in patients with a condition called acromegaly. It also works to slow or stop tumor growth in patients with neuroendocrine tumors and treat carcinoid syndrome. This medicine may be used for other purposes; ask your health care provider or pharmacist if you have questions. COMMON BRAND NAME(S): Somatuline Depot What should I tell my health care provider before I take this medicine? They need to know if you have any of these conditions:  diabetes  gallbladder disease  heart disease  kidney disease  liver disease  thyroid disease  an unusual or allergic reaction to lanreotide, other medicines, foods, dyes, or preservatives  pregnant or trying to get pregnant  breast-feeding How should I use this medicine? This medicine is for injection under the skin. It is given by a health care professional in a hospital or clinic setting. Contact your pediatrician or health care professional regarding the use of this medicine in children. Special care may be needed. Overdosage: If you think you have taken too much of this medicine contact a poison control center or emergency room at once. NOTE: This medicine is only for you. Do not share this medicine with others. What if I miss a dose? It is important not to miss your dose. Call your doctor or health care professional if you are unable to keep an appointment. What may interact with this medicine? This medicine may interact with the following medications:  bromocriptine  cyclosporine  certain medicines for blood pressure, heart disease, irregular heart beat  certain medicines for diabetes  quinidine  terfenadine This list may not describe all possible interactions. Give your health care provider a list of all the medicines, herbs, non-prescription drugs, or dietary supplements you use. Also tell them if you smoke,  drink alcohol, or use illegal drugs. Some items may interact with your medicine. What should I watch for while using this medicine? Tell your doctor or healthcare professional if your symptoms do not start to get better or if they get worse. Visit your doctor or health care professional for regular checks on your progress. Your condition will be monitored carefully while you are receiving this medicine. This medicine may increase blood sugar. Ask your healthcare provider if changes in diet or medicines are needed if you have diabetes. You may need blood work done while you are taking this medicine. Women should inform their doctor if they wish to become pregnant or think they might be pregnant. There is a potential for serious side effects to an unborn child. Talk to your health care professional or pharmacist for more information. Do not breast-feed an infant while taking this medicine or for 6 months after stopping it. This medicine has caused ovarian failure in some women. This medicine may interfere with the ability to have a child. Talk with your doctor or health care professional if you are concerned about your fertility. What side effects may I notice from receiving this medicine? Side effects that you should report to your doctor or health care professional as soon as possible:  allergic reactions like skin rash, itching or hives, swelling of the face, lips, or tongue  increased blood pressure  severe stomach pain  signs and symptoms of hgh blood sugar such as being more thirsty or hungry or having to urinate more than normal. You may also feel very tired or have blurry vision.  signs and symptoms of low blood   sugar such as feeling anxious; confusion; dizziness; increased hunger; unusually weak or tired; sweating; shakiness; cold; irritable; headache; blurred vision; fast heartbeat; loss of consciousness  unusually slow heartbeat Side effects that usually do not require medical  attention (report to your doctor or health care professional if they continue or are bothersome):  constipation  diarrhea  dizziness  headache  muscle pain  muscle spasms  nausea  pain, redness, or irritation at site where injected This list may not describe all possible side effects. Call your doctor for medical advice about side effects. You may report side effects to FDA at 1-800-FDA-1088. Where should I keep my medicine? This drug is given in a hospital or clinic and will not be stored at home. NOTE: This sheet is a summary. It may not cover all possible information. If you have questions about this medicine, talk to your doctor, pharmacist, or health care provider.  2020 Elsevier/Gold Standard (2018-03-29 09:13:08)  

## 2020-06-16 LAB — CHROMOGRANIN A: Chromogranin A (ng/mL): 27.9 ng/mL (ref 0.0–101.8)

## 2020-06-29 ENCOUNTER — Other Ambulatory Visit: Payer: Self-pay | Admitting: Internal Medicine

## 2020-06-29 DIAGNOSIS — D509 Iron deficiency anemia, unspecified: Secondary | ICD-10-CM

## 2020-06-29 NOTE — Telephone Encounter (Signed)
I received a refill request for further iron tab  Ok to let pt know that at this point since her last iron and ferritin lab were just back to low normal in April 2021 and assuming she has been taking the iron pills, and hopefully no further bleeding, I think we can stop the iron pills for now  At her convenience, however, we should check her iron testing  Please ask pt to go to the [facility] lab for f/u lab, sometime in the next 1-2 wks would be good.  thanks

## 2020-07-02 ENCOUNTER — Other Ambulatory Visit: Payer: Self-pay | Admitting: *Deleted

## 2020-07-02 DIAGNOSIS — C7A8 Other malignant neuroendocrine tumors: Secondary | ICD-10-CM

## 2020-07-05 NOTE — Progress Notes (Signed)
[place] [phone]   [phone]  PROGRESS NOTE  Patient Care Team: Biagio Borg, MD as PCP - General  Hematological/Oncological History # Metastatic Neuroendocrine Tumor, Metastasis to the Spine 1) 08/26/2019: CT Abdomen Pelvis performed due to RUQ abdominal pain. Imaging showed a soft tissue mass abutting the right posterior eighth rib 2) 09/06/2019: CT chest performed which showed Ill-defined soft tissue nodule along the medial right posterior thoracic wall at the 8th intercostal space to the right of the spine. MRI was recommended. 3) 10/19/2019: MRI Thoracic spine showed enhancing lesions throughout the visualized spine consistent with metastatic disease. Additionally there was a 1.5 cm soft tissue nodule in the posteromedial right eighth intercostal space, more concerning for a metastasis 4) 10/25/2019: establish care with Dr. Lorenso Courier   5) 11/05/2019: attempted biopsy of soft tissue mass at right posterior 8th rib, but lesion had dissipated at time of biopsy attempt 6) 11/27/2019: PET CT scan performed showing right inguinal lymph nodes and osseous lesions, indicative of metastatic disease of unknown primary. 7) 12/11/2019: CT bone marrow biopsy showed metastatic neuroendocrine neoplasm 8) 12/27/2019: start of lanreotide 129m sub q28 days   #Low Grade Neuroendocrine Tumor 1) 2006: reportedly had rectal carcinoid tumor removed 2) 08/14/2008: patient had a flexibile sigmoidoscopy with endoscopic ultrasound which resected an 826m subepithelial lesion in rectum. Findings consistent with low grade neuroendocrine tumor. 3) 04/08/2010: repeat colonoscopy, no residual tumor. Repeat recommended in 2016.  4) 12/11/2019: metastatic recurrence found on biopsy, noted above.  5) 12/27/2019: start of lanreotide 12022mub q28 days   Interval History:  Michelle Solomon 60o. female with medical history significant for metastatic neuroendocrine tumor of the colon who  presents for a follow up visit. The patient's last visit was on 04/01/2020 to assure tolerance of lanreotide therapy. In the interim since the last visit the patient has had no hospitalizations or ED visits and has continued with monthly lanreotide.   On exam today Michelle Solomon she has been well in the interim since her last visit.  She notes that she does have some periodic numbness in her right leg and some occasional pain in her lower back.  She notes that the pain has been steady at its current baseline.  She notes that when it does flareup and become worse than usual she takes a single oxycodone and that helps.  She notes that she is tolerating her lanreotide shots well she is no longer having diarrhea, though occasionally she has some stomach tightness and cramping 1 to 2 days after her shot.  Her weight is otherwise been stable and she denies having any issues with fevers, chills, sweats, nausea, vomiting or diarrhea.  She has a rare hot flash.  She also notes that she unfortunately began smoking again in November and that may explain why her chromogranin had a slight bump from prior.  A full 10 point ROS is listed below.  MEDICAL HISTORY:  Past Medical History:  Diagnosis Date  . Acute bronchitis 06/06/2010  . ALLERGIC RHINITIS 05/29/2007  . Allergy    seasonal  . Anal or rectal pain 06/23/2008  . ANEMIA-IRON DEFICIENCY 05/29/2007  . Anxiety   . ASTHMA 05/29/2007  . Asthma   . 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 CONJUNCTIVITIS, ALLERGIC 11/27/2008  . HEMORRHOIDS 06/16/2008  . HTN (hypertension)   . Hx of adenomatous colonic polyps 11/2019  . NIPPLE DISCHARGE 01/21/2010  . Other specified forms of hearing loss 10/08/2009  .  Overweight(278.02) 05/29/2007  . RASH-NONVESICULAR 05/29/2007  . Wheezing 06/22/2010    SURGICAL HISTORY: Past Surgical History:  Procedure Laterality Date  . COLONOSCOPY      SOCIAL HISTORY: Social History   Socioeconomic History  .  Marital status: Married    Spouse name: Not on file  . Number of children: 3  . Years of education: Not on file  . Highest education level: Not on file  Occupational History  . Occupation: CARD REPLACEMEN    Employer: [employer]  . Occupation: customer service  Tobacco Use  . Smoking status: Former Smoker    Packs/day: 0.50    Years: 40.00    Pack years: 20.00    Types: Cigarettes    Quit date: 10/22/2019    Years since quitting: 0.7  . Smokeless tobacco: Never Used  . Tobacco comment: 1 pack weekly.  started smoking at 60 years old  Vaping Use  . Vaping Use: Never used  Substance and Sexual Activity  . Alcohol use: Yes    Comment: ocassional  . Drug use: No  . Sexual activity: Not on file  Other Topics Concern  . Not on file  Social History Narrative   Daily caffeine use 3   Patient does not get regular exercise   Social Determinants of Health   Financial Resource Strain: Not on file  Food Insecurity: Not on file  Transportation Needs: Not on file  Physical Activity: Not on file  Stress: Not on file  Social Connections: Not on file  Intimate Partner Violence: Not on file    FAMILY HISTORY: Family History  Problem Relation Age of Onset  . Allergies Sister   . Diabetes Sister   . Hypertension Sister   . Hypertension Mother   . Prostate cancer Father   . Hypertension Brother   . Hypertension Sister   . Diabetes Paternal Aunt   . Cancer Paternal Aunt        type unknown  . Prostate cancer Brother   . Stroke Maternal Aunt   . Hypertension Maternal Aunt   . Colon cancer Neg Hx   . Esophageal cancer Neg Hx   . Rectal cancer Neg Hx   . Stomach cancer Neg Hx     ALLERGIES:  has No Known Allergies.  MEDICATIONS:  Current Outpatient Medications  Medication Sig Dispense Refill  . ALPRAZolam (XANAX) 0.5 MG tablet Take 1 tablet (0.5 mg total) by mouth 2 (two) times daily as needed for anxiety or sleep. 60 tablet 2  . buPROPion (WELLBUTRIN XL) 300 MG 24  hr tablet TAKE 1 TABLET(300 MG) BY MOUTH DAILY 90 tablet 1  . cholecalciferol (VITAMIN D3) 25 MCG (1000 UNIT) tablet Take 1,000 Units by mouth daily.    [name] Kitchen escitalopram (LEXAPRO) 20 MG tablet Take 20 mg by mouth daily.    . hydrochlorothiazide (HYDRODIURIL) 12.5 MG tablet TAKE 1 TABLET(12.5 MG) BY MOUTH DAILY 90 tablet 0  . hydrocortisone 2.5 % cream Apply topically 2 (two) times daily. (Patient not taking: Reported on 11/01/2019) 30 g 1  . iron polysaccharides (NU-IRON) 150 MG capsule Take 1 capsule (150 mg total) by mouth daily. 90 capsule 1  . loratadine (CLARITIN) 10 MG tablet Take 10 mg by mouth daily as needed for allergies.    [name] Kitchen oxyCODONE-acetaminophen (PERCOCET/ROXICET) 5-325 MG tablet Take 1 tablet by mouth every 6 (six) hours as needed for severe pain. 30 tablet 0  . Turmeric 500 MG CAPS Take 500 mg by mouth daily.    [name] Kitchen  vitamin B-12 (CYANOCOBALAMIN) 100 MCG tablet Take 100 mcg by mouth daily.    [name] Kitchen VITAMIN D-VITAMIN K PO Take 1 tablet by mouth daily.    [name] Kitchen zolpidem (AMBIEN) 10 MG tablet Take 1 tablet (10 mg total) by mouth at bedtime as needed for up to 30 days for sleep. 30 tablet 5   No current facility-administered medications for this visit.    REVIEW OF SYSTEMS:   Constitutional: ( - ) fevers, ( - )  chills , ( - ) night sweats Eyes: ( - ) blurriness of vision, ( - ) double vision, ( - ) watery eyes Ears, nose, mouth, throat, and face: ( - ) mucositis, ( - ) sore throat Respiratory: ( - ) cough, ( - ) dyspnea, ( - ) wheezes Cardiovascular: ( - ) palpitation, ( - ) chest discomfort, ( - ) lower extremity swelling Gastrointestinal:  ( - ) nausea, ( - ) heartburn, ( - ) change in bowel habits Skin: ( - ) abnormal skin rashes Lymphatics: ( - ) new lymphadenopathy, ( - ) easy bruising Neurological: ( - ) numbness, ( - ) tingling, ( - ) new weaknesses Behavioral/Psych: ( - ) mood change, ( - ) new changes  All other systems were reviewed with the patient and are negative.  PHYSICAL  EXAMINATION: ECOG PERFORMANCE STATUS: 0 - Asymptomatic  Vitals:   07/06/20 1016  BP: (!) 140/98  Pulse: 94  Resp: 18  Temp: 98.6 F (37 C)  SpO2: 100%   Filed Weights   07/06/20 1016  Weight: 190 lb 6.4 oz (86.4 kg)    GENERAL: well appearing middle aged [place] American female in NAD  SKIN: skin color, texture, turgor are normal, no rashes or significant lesions EYES: conjunctiva are pink and non-injected, sclera clear LUNGS: clear to auscultation and percussion with normal breathing effort HEART: regular rate & rhythm and no murmurs and no lower extremity edema Musculoskeletal: no cyanosis of digits and no clubbing  PSYCH: alert & oriented x 3, fluent speech NEURO: no focal motor/sensory deficits  LABORATORY DATA:  I have reviewed the data as listed CBC Latest Ref Rng & Units 07/06/2020 06/12/2020 05/15/2020  WBC 4.0 - 10.5 K/uL 8.2 8.5 6.3  Hemoglobin 12.0 - 15.0 g/dL 12.4 12.4 12.8  Hematocrit 36.0 - 46.0 % 38.1 38.8 39.9  Platelets 150 - 400 K/uL 251 280 275    CMP Latest Ref Rng & Units 07/06/2020 06/12/2020 05/15/2020  Glucose 70 - 99 mg/dL 109(H) 98 204(H)  BUN 6 - 20 mg/dL _0 Creatinine 0.44 - 1.00 mg/dL 1.05(H) 0.93 0.94  Sodium 135 - 145 mmol/L 140 142 138  Potassium 3.5 - 5.1 mmol/L 3.7 3.8 3.9  Chloride 98 - 111 mmol/L 106 105 103  CO2 22 - 32 mmol/L _1 Calcium 8.9 - 10.3 mg/dL 9.6 9.7 9.5  Total Protein 6.5 - 8.1 g/dL 7.2 7.5 7.6  Total Bilirubin 0.3 - 1.2 mg/dL 0.4 0.3 0.6  Alkaline Phos 38 - 126 U/L 98 101 93  AST 15 - 41 U/L 15 16 13(L)  ALT 0 - 44 U/L _2 RADIOGRAPHIC STUDIES: I have personally reviewed the radiological images as listed and agreed with the findings in the report: increased FDG intake in several areas of the spine and inguinal lymph nodes.  No results found.  ASSESSMENT & PLAN Michelle Solomon 60 y.o. female with medical history significant for metastatic neuroendocrine tumor of the colon  who presents for a  follow up visit.  After review of the labs, review the pathology, and review the PET CT imaging the findings are most consistent with metastatic neuroendocrine tumor with metastasis to the spine.  The pathology currently appears to be consistent with the neuroendocrine tumor which was previously resected from the patient's rectum.  On exam today Ms. Sterling notes she is tolerating treatment well and ready and able to proceed with continued lanreotide therapy.  Her next dose is 07/10/2020.   Previously we discussed the nature of metastatic neuroendocrine tumor and the treatment options moving forward.  Fortunately this is a slow-growing tumor and observation would be an appropriate treatment option.  I noted that we could do serial imaging to monitor the progression of the tumor and determine if treatment was required.  If the patient wanted to proceed with treatment we could consider lanreotide or octreotide subcu q. 28 days for control of the disease.  I noted that the side effects that could be expected would be nausea, vomiting, diarrhea, abdominal pain, and injection site reactions.  The patient noted that she would prefer to proceed with treatment.  We will plan to continue lanreotide 120 mg subcu q. 28 days with interval imaging approximately 3 months from the start of treatment.  The patient voiced her understanding of this plan and was eager to move forward.  # Metastatic Neuroendocrine Tumor, Metastasis to the Spine  --previously discussed with patient that the options would include observation with serial imaging or starting lanreotide therapy. The patient opted to start treatment --will plan for continued lanreotide 169m subq q28 days. Started therapy on 12/27/2019. This will be continued until progression or intolerance to therapy. Next dose due on 07/10/2020 (Friday)  --PET CT scan to be performed later this month.  --RTC in 3 month for interval visit.    Orders Placed This Encounter  Procedures   . NM PET (CU-64 DETECTNET)SKULL TO MID THIGH    Standing Status:   Future    Standing Expiration Date:   07/06/2021    Order Specific Question:   If indicated for the ordered procedure, I authorize the administration of a radiopharmaceutical per Radiology protocol    Answer:   Yes    Order Specific Question:   Is the patient pregnant?    Answer:   No    Order Specific Question:   Preferred imaging location?    Answer:   [facility]  All questions were answered. The patient knows to call the clinic with any problems, questions or concerns.  A total of more than 30 minutes were spent on this encounter and over half of that time was spent on counseling and coordination of care as outlined above.   JLedell Peoples MD Department of Hematology/Oncology [facility]ironat [facility] LLCPhone: [phone]: [phone]: [email or web address]  07/06/2020 11:51 AM

## 2020-07-06 ENCOUNTER — Other Ambulatory Visit: Payer: Self-pay

## 2020-07-06 ENCOUNTER — Inpatient Hospital Stay: Payer: 59 | Attending: Hematology and Oncology | Admitting: Hematology and Oncology

## 2020-07-06 ENCOUNTER — Inpatient Hospital Stay: Payer: 59

## 2020-07-06 VITALS — BP 140/98 | HR 94 | Temp 98.6°F | Resp 18 | Ht 63.0 in | Wt 190.4 lb

## 2020-07-06 DIAGNOSIS — C7A8 Other malignant neuroendocrine tumors: Secondary | ICD-10-CM

## 2020-07-06 DIAGNOSIS — M899 Disorder of bone, unspecified: Secondary | ICD-10-CM | POA: Diagnosis not present

## 2020-07-06 DIAGNOSIS — Z79899 Other long term (current) drug therapy: Secondary | ICD-10-CM | POA: Insufficient documentation

## 2020-07-06 DIAGNOSIS — C7951 Secondary malignant neoplasm of bone: Secondary | ICD-10-CM | POA: Diagnosis not present

## 2020-07-06 DIAGNOSIS — C7B8 Other secondary neuroendocrine tumors: Secondary | ICD-10-CM | POA: Diagnosis not present

## 2020-07-06 LAB — CMP (CANCER CENTER ONLY)
ALT: 21 U/L (ref 0–44)
AST: 15 U/L (ref 15–41)
Albumin: 3.8 g/dL (ref 3.5–5.0)
Alkaline Phosphatase: 98 U/L (ref 38–126)
Anion gap: 6 (ref 5–15)
BUN: 19 mg/dL (ref 6–20)
CO2: 28 mmol/L (ref 22–32)
Calcium: 9.6 mg/dL (ref 8.9–10.3)
Chloride: 106 mmol/L (ref 98–111)
Creatinine: 1.05 mg/dL — ABNORMAL HIGH (ref 0.44–1.00)
GFR, Estimated: >60 mL/min (ref >60)
Glucose, Bld: 109 mg/dL — ABNORMAL HIGH (ref 70–99)
Potassium: 3.7 mmol/L (ref 3.5–5.1)
Sodium: 140 mmol/L (ref 135–145)
Total Bilirubin: 0.4 mg/dL (ref 0.3–1.2)
Total Protein: 7.2 g/dL (ref 6.5–8.1)

## 2020-07-06 LAB — CBC WITH DIFFERENTIAL (CANCER CENTER ONLY)
Abs Immature Granulocytes: 0.02 10*3/uL (ref 0.00–0.07)
Basophils Absolute: 0 10*3/uL (ref 0.0–0.1)
Basophils Relative: 0 %
Eosinophils Absolute: 0.2 10*3/uL (ref 0.0–0.5)
Eosinophils Relative: 2 %
HCT: 38.1 % (ref 36.0–46.0)
Hemoglobin: 12.4 g/dL (ref 12.0–15.0)
Immature Granulocytes: 0 %
Lymphocytes Relative: 49 %
Lymphs Abs: 3.9 10*3/uL (ref 0.7–4.0)
MCH: 27.3 pg (ref 26.0–34.0)
MCHC: 32.5 g/dL (ref 30.0–36.0)
MCV: 83.9 fL (ref 80.0–100.0)
Monocytes Absolute: 0.7 10*3/uL (ref 0.1–1.0)
Monocytes Relative: 8 %
Neutro Abs: 3.4 10*3/uL (ref 1.7–7.7)
Neutrophils Relative %: 41 %
Platelet Count: 251 10*3/uL (ref 150–400)
RBC: 4.54 MIL/uL (ref 3.87–5.11)
RDW: 13.4 % (ref 11.5–15.5)
WBC Count: 8.2 10*3/uL (ref 4.0–10.5)
nRBC: 0 % (ref 0.0–0.2)

## 2020-07-07 ENCOUNTER — Telehealth: Payer: Self-pay | Admitting: Hematology and Oncology

## 2020-07-07 NOTE — Telephone Encounter (Signed)
Scheduled per 1/3 los. Pt will receive an updated appt calendar per next visit appt notes  

## 2020-07-10 ENCOUNTER — Inpatient Hospital Stay: Payer: 59

## 2020-07-10 ENCOUNTER — Other Ambulatory Visit: Payer: Self-pay

## 2020-07-10 ENCOUNTER — Other Ambulatory Visit: Payer: Self-pay | Admitting: Internal Medicine

## 2020-07-10 ENCOUNTER — Other Ambulatory Visit: Payer: Self-pay | Admitting: Hematology and Oncology

## 2020-07-10 VITALS — BP 159/92 | HR 78 | Temp 98.4°F | Resp 18

## 2020-07-10 DIAGNOSIS — C7B8 Other secondary neuroendocrine tumors: Secondary | ICD-10-CM

## 2020-07-10 DIAGNOSIS — R739 Hyperglycemia, unspecified: Secondary | ICD-10-CM

## 2020-07-10 DIAGNOSIS — E559 Vitamin D deficiency, unspecified: Secondary | ICD-10-CM

## 2020-07-10 DIAGNOSIS — Z0000 Encounter for general adult medical examination without abnormal findings: Secondary | ICD-10-CM

## 2020-07-10 DIAGNOSIS — C7A8 Other malignant neuroendocrine tumors: Secondary | ICD-10-CM | POA: Diagnosis not present

## 2020-07-10 LAB — CBC WITH DIFFERENTIAL (CANCER CENTER ONLY)
Abs Immature Granulocytes: 0.01 10*3/uL (ref 0.00–0.07)
Basophils Absolute: 0 10*3/uL (ref 0.0–0.1)
Basophils Relative: 0 %
Eosinophils Absolute: 0.2 10*3/uL (ref 0.0–0.5)
Eosinophils Relative: 3 %
HCT: 40.4 % (ref 36.0–46.0)
Hemoglobin: 12.9 g/dL (ref 12.0–15.0)
Immature Granulocytes: 0 %
Lymphocytes Relative: 47 %
Lymphs Abs: 3.2 10*3/uL (ref 0.7–4.0)
MCH: 27.3 pg (ref 26.0–34.0)
MCHC: 31.9 g/dL (ref 30.0–36.0)
MCV: 85.4 fL (ref 80.0–100.0)
Monocytes Absolute: 0.5 10*3/uL (ref 0.1–1.0)
Monocytes Relative: 8 %
Neutro Abs: 2.9 10*3/uL (ref 1.7–7.7)
Neutrophils Relative %: 42 %
Platelet Count: 249 10*3/uL (ref 150–400)
RBC: 4.73 MIL/uL (ref 3.87–5.11)
RDW: 13.8 % (ref 11.5–15.5)
WBC Count: 6.9 10*3/uL (ref 4.0–10.5)
nRBC: 0 % (ref 0.0–0.2)

## 2020-07-10 LAB — CMP (CANCER CENTER ONLY)
ALT: 13 U/L (ref 0–44)
AST: 14 U/L — ABNORMAL LOW (ref 15–41)
Albumin: 3.7 g/dL (ref 3.5–5.0)
Alkaline Phosphatase: 94 U/L (ref 38–126)
Anion gap: 7 (ref 5–15)
BUN: 11 mg/dL (ref 6–20)
CO2: 29 mmol/L (ref 22–32)
Calcium: 9.4 mg/dL (ref 8.9–10.3)
Chloride: 106 mmol/L (ref 98–111)
Creatinine: 0.78 mg/dL (ref 0.44–1.00)
GFR, Estimated: >60 mL/min (ref >60)
Glucose, Bld: 124 mg/dL — ABNORMAL HIGH (ref 70–99)
Potassium: 4 mmol/L (ref 3.5–5.1)
Sodium: 142 mmol/L (ref 135–145)
Total Bilirubin: 0.3 mg/dL (ref 0.3–1.2)
Total Protein: 7.2 g/dL (ref 6.5–8.1)

## 2020-07-10 MED ORDER — LANREOTIDE ACETATE 120 MG/0.5ML ~~LOC~~ SOLN
120.0000 mg | Freq: Once | SUBCUTANEOUS | Status: AC
  Administered 2020-07-10: 120 mg via SUBCUTANEOUS
  Filled 2020-07-10: qty 120

## 2020-07-10 NOTE — Patient Instructions (Signed)
Lanreotide injection What is this medicine? LANREOTIDE (lan REE oh tide) is used to reduce blood levels of growth hormone in patients with a condition called acromegaly. It also works to slow or stop tumor growth in patients with neuroendocrine tumors and treat carcinoid syndrome. This medicine may be used for other purposes; ask your health care provider or pharmacist if you have questions. COMMON BRAND NAME(S): Somatuline Depot What should I tell my health care provider before I take this medicine? They need to know if you have any of these conditions:  diabetes  gallbladder disease  heart disease  kidney disease  liver disease  thyroid disease  an unusual or allergic reaction to lanreotide, other medicines, foods, dyes, or preservatives  pregnant or trying to get pregnant  breast-feeding How should I use this medicine? This medicine is for injection under the skin. It is given by a health care professional in a hospital or clinic setting. Contact your pediatrician or health care professional regarding the use of this medicine in children. Special care may be needed. Overdosage: If you think you have taken too much of this medicine contact a poison control center or emergency room at once. NOTE: This medicine is only for you. Do not share this medicine with others. What if I miss a dose? It is important not to miss your dose. Call your doctor or health care professional if you are unable to keep an appointment. What may interact with this medicine? This medicine may interact with the following medications:  bromocriptine  cyclosporine  certain medicines for blood pressure, heart disease, irregular heart beat  certain medicines for diabetes  quinidine  terfenadine This list may not describe all possible interactions. Give your health care provider a list of all the medicines, herbs, non-prescription drugs, or dietary supplements you use. Also tell them if you smoke,  drink alcohol, or use illegal drugs. Some items may interact with your medicine. What should I watch for while using this medicine? Tell your doctor or healthcare professional if your symptoms do not start to get better or if they get worse. Visit your doctor or health care professional for regular checks on your progress. Your condition will be monitored carefully while you are receiving this medicine. This medicine may increase blood sugar. Ask your healthcare provider if changes in diet or medicines are needed if you have diabetes. You may need blood work done while you are taking this medicine. Women should inform their doctor if they wish to become pregnant or think they might be pregnant. There is a potential for serious side effects to an unborn child. Talk to your health care professional or pharmacist for more information. Do not breast-feed an infant while taking this medicine or for 6 months after stopping it. This medicine has caused ovarian failure in some women. This medicine may interfere with the ability to have a child. Talk with your doctor or health care professional if you are concerned about your fertility. What side effects may I notice from receiving this medicine? Side effects that you should report to your doctor or health care professional as soon as possible:  allergic reactions like skin rash, itching or hives, swelling of the face, lips, or tongue  increased blood pressure  severe stomach pain  signs and symptoms of hgh blood sugar such as being more thirsty or hungry or having to urinate more than normal. You may also feel very tired or have blurry vision.  signs and symptoms of low blood   sugar such as feeling anxious; confusion; dizziness; increased hunger; unusually weak or tired; sweating; shakiness; cold; irritable; headache; blurred vision; fast heartbeat; loss of consciousness  unusually slow heartbeat Side effects that usually do not require medical  attention (report to your doctor or health care professional if they continue or are bothersome):  constipation  diarrhea  dizziness  headache  muscle pain  muscle spasms  nausea  pain, redness, or irritation at site where injected This list may not describe all possible side effects. Call your doctor for medical advice about side effects. You may report side effects to FDA at 1-800-FDA-1088. Where should I keep my medicine? This drug is given in a hospital or clinic and will not be stored at home. NOTE: This sheet is a summary. It may not cover all possible information. If you have questions about this medicine, talk to your doctor, pharmacist, or health care provider.  2020 Elsevier/Gold Standard (2018-03-29 09:13:08)  

## 2020-08-07 ENCOUNTER — Other Ambulatory Visit: Payer: Self-pay

## 2020-08-07 ENCOUNTER — Inpatient Hospital Stay: Payer: 59 | Attending: Hematology and Oncology

## 2020-08-07 ENCOUNTER — Inpatient Hospital Stay: Payer: 59

## 2020-08-07 ENCOUNTER — Other Ambulatory Visit: Payer: Self-pay | Admitting: *Deleted

## 2020-08-07 VITALS — BP 150/111 | HR 77 | Resp 18

## 2020-08-07 DIAGNOSIS — C7B8 Other secondary neuroendocrine tumors: Secondary | ICD-10-CM

## 2020-08-07 DIAGNOSIS — C7951 Secondary malignant neoplasm of bone: Secondary | ICD-10-CM | POA: Insufficient documentation

## 2020-08-07 DIAGNOSIS — C7A8 Other malignant neuroendocrine tumors: Secondary | ICD-10-CM | POA: Insufficient documentation

## 2020-08-07 DIAGNOSIS — Z79899 Other long term (current) drug therapy: Secondary | ICD-10-CM | POA: Diagnosis not present

## 2020-08-07 LAB — CMP (CANCER CENTER ONLY)
ALT: 19 U/L (ref 0–44)
AST: 18 U/L (ref 15–41)
Albumin: 3.8 g/dL (ref 3.5–5.0)
Alkaline Phosphatase: 100 U/L (ref 38–126)
Anion gap: 7 (ref 5–15)
BUN: 11 mg/dL (ref 6–20)
CO2: 30 mmol/L (ref 22–32)
Calcium: 9.3 mg/dL (ref 8.9–10.3)
Chloride: 104 mmol/L (ref 98–111)
Creatinine: 0.82 mg/dL (ref 0.44–1.00)
GFR, Estimated: >60 mL/min (ref >60)
Glucose, Bld: 142 mg/dL — ABNORMAL HIGH (ref 70–99)
Potassium: 3.9 mmol/L (ref 3.5–5.1)
Sodium: 141 mmol/L (ref 135–145)
Total Bilirubin: 0.4 mg/dL (ref 0.3–1.2)
Total Protein: 7.2 g/dL (ref 6.5–8.1)

## 2020-08-07 LAB — CBC WITH DIFFERENTIAL (CANCER CENTER ONLY)
Abs Immature Granulocytes: 0.02 10*3/uL (ref 0.00–0.07)
Basophils Absolute: 0 10*3/uL (ref 0.0–0.1)
Basophils Relative: 1 %
Eosinophils Absolute: 0.3 10*3/uL (ref 0.0–0.5)
Eosinophils Relative: 4 %
HCT: 39.5 % (ref 36.0–46.0)
Hemoglobin: 12.9 g/dL (ref 12.0–15.0)
Immature Granulocytes: 0 %
Lymphocytes Relative: 49 %
Lymphs Abs: 4 10*3/uL (ref 0.7–4.0)
MCH: 27.4 pg (ref 26.0–34.0)
MCHC: 32.7 g/dL (ref 30.0–36.0)
MCV: 84 fL (ref 80.0–100.0)
Monocytes Absolute: 0.8 10*3/uL (ref 0.1–1.0)
Monocytes Relative: 9 %
Neutro Abs: 3.1 10*3/uL (ref 1.7–7.7)
Neutrophils Relative %: 37 %
Platelet Count: 265 10*3/uL (ref 150–400)
RBC: 4.7 MIL/uL (ref 3.87–5.11)
RDW: 13.7 % (ref 11.5–15.5)
WBC Count: 8.2 10*3/uL (ref 4.0–10.5)
nRBC: 0 % (ref 0.0–0.2)

## 2020-08-07 MED ORDER — LANREOTIDE ACETATE 120 MG/0.5ML ~~LOC~~ SOLN
120.0000 mg | Freq: Once | SUBCUTANEOUS | Status: AC
  Administered 2020-08-07: 120 mg via SUBCUTANEOUS
  Filled 2020-08-07: qty 120

## 2020-08-07 NOTE — Patient Instructions (Signed)
Lanreotide injection What is this medicine? LANREOTIDE (lan REE oh tide) is used to reduce blood levels of growth hormone in patients with a condition called acromegaly. It also works to slow or stop tumor growth in patients with neuroendocrine tumors and treat carcinoid syndrome. This medicine may be used for other purposes; ask your health care provider or pharmacist if you have questions. COMMON BRAND NAME(S): Somatuline Depot What should I tell my health care provider before I take this medicine? They need to know if you have any of these conditions:  diabetes  gallbladder disease  heart disease  kidney disease  liver disease  thyroid disease  an unusual or allergic reaction to lanreotide, other medicines, foods, dyes, or preservatives  pregnant or trying to get pregnant  breast-feeding How should I use this medicine? This medicine is for injection under the skin. It is given by a health care professional in a hospital or clinic setting. Contact your pediatrician or health care professional regarding the use of this medicine in children. Special care may be needed. Overdosage: If you think you have taken too much of this medicine contact a poison control center or emergency room at once. NOTE: This medicine is only for you. Do not share this medicine with others. What if I miss a dose? It is important not to miss your dose. Call your doctor or health care professional if you are unable to keep an appointment. What may interact with this medicine? This medicine may interact with the following medications:  bromocriptine  cyclosporine  certain medicines for blood pressure, heart disease, irregular heart beat  certain medicines for diabetes  quinidine  terfenadine This list may not describe all possible interactions. Give your health care provider a list of all the medicines, herbs, non-prescription drugs, or dietary supplements you use. Also tell them if you smoke,  drink alcohol, or use illegal drugs. Some items may interact with your medicine. What should I watch for while using this medicine? Tell your doctor or healthcare professional if your symptoms do not start to get better or if they get worse. Visit your doctor or health care professional for regular checks on your progress. Your condition will be monitored carefully while you are receiving this medicine. This medicine may increase blood sugar. Ask your healthcare provider if changes in diet or medicines are needed if you have diabetes. You may need blood work done while you are taking this medicine. Women should inform their doctor if they wish to become pregnant or think they might be pregnant. There is a potential for serious side effects to an unborn child. Talk to your health care professional or pharmacist for more information. Do not breast-feed an infant while taking this medicine or for 6 months after stopping it. This medicine has caused ovarian failure in some women. This medicine may interfere with the ability to have a child. Talk with your doctor or health care professional if you are concerned about your fertility. What side effects may I notice from receiving this medicine? Side effects that you should report to your doctor or health care professional as soon as possible:  allergic reactions like skin rash, itching or hives, swelling of the face, lips, or tongue  increased blood pressure  severe stomach pain  signs and symptoms of hgh blood sugar such as being more thirsty or hungry or having to urinate more than normal. You may also feel very tired or have blurry vision.  signs and symptoms of low blood   sugar such as feeling anxious; confusion; dizziness; increased hunger; unusually weak or tired; sweating; shakiness; cold; irritable; headache; blurred vision; fast heartbeat; loss of consciousness  unusually slow heartbeat Side effects that usually do not require medical  attention (report to your doctor or health care professional if they continue or are bothersome):  constipation  diarrhea  dizziness  headache  muscle pain  muscle spasms  nausea  pain, redness, or irritation at site where injected This list may not describe all possible side effects. Call your doctor for medical advice about side effects. You may report side effects to FDA at 1-800-FDA-1088. Where should I keep my medicine? This drug is given in a hospital or clinic and will not be stored at home. NOTE: This sheet is a summary. It may not cover all possible information. If you have questions about this medicine, talk to your doctor, pharmacist, or health care provider.  2021 Elsevier/Gold Standard (2018-03-29 09:13:08)  

## 2020-08-20 ENCOUNTER — Other Ambulatory Visit: Payer: Self-pay | Admitting: Internal Medicine

## 2020-08-20 NOTE — Telephone Encounter (Signed)
Ok to contact pt  Recent Hgb is normal  If no one has told her she should stay on the iron pill "forever"  I think this can be stopped now; just let me know

## 2020-08-21 NOTE — Telephone Encounter (Signed)
Patient was not told to take the iron pill forever so she understands the medication wont be refilled.

## 2020-09-04 ENCOUNTER — Other Ambulatory Visit: Payer: Self-pay | Admitting: *Deleted

## 2020-09-04 ENCOUNTER — Inpatient Hospital Stay: Payer: 59 | Attending: Hematology and Oncology

## 2020-09-04 ENCOUNTER — Other Ambulatory Visit: Payer: Self-pay

## 2020-09-04 ENCOUNTER — Telehealth: Payer: Self-pay | Admitting: *Deleted

## 2020-09-04 ENCOUNTER — Inpatient Hospital Stay: Payer: 59

## 2020-09-04 VITALS — BP 171/105 | HR 89 | Temp 98.7°F | Resp 18

## 2020-09-04 DIAGNOSIS — C7A8 Other malignant neuroendocrine tumors: Secondary | ICD-10-CM | POA: Diagnosis present

## 2020-09-04 DIAGNOSIS — C7B8 Other secondary neuroendocrine tumors: Secondary | ICD-10-CM

## 2020-09-04 LAB — CBC WITH DIFFERENTIAL (CANCER CENTER ONLY)
Abs Immature Granulocytes: 0.02 10*3/uL (ref 0.00–0.07)
Basophils Absolute: 0 10*3/uL (ref 0.0–0.1)
Basophils Relative: 0 %
Eosinophils Absolute: 0.2 10*3/uL (ref 0.0–0.5)
Eosinophils Relative: 3 %
HCT: 40.1 % (ref 36.0–46.0)
Hemoglobin: 12.8 g/dL (ref 12.0–15.0)
Immature Granulocytes: 0 %
Lymphocytes Relative: 43 %
Lymphs Abs: 3.6 10*3/uL (ref 0.7–4.0)
MCH: 26.8 pg (ref 26.0–34.0)
MCHC: 31.9 g/dL (ref 30.0–36.0)
MCV: 84.1 fL (ref 80.0–100.0)
Monocytes Absolute: 0.8 10*3/uL (ref 0.1–1.0)
Monocytes Relative: 10 %
Neutro Abs: 3.8 10*3/uL (ref 1.7–7.7)
Neutrophils Relative %: 44 %
Platelet Count: 270 10*3/uL (ref 150–400)
RBC: 4.77 MIL/uL (ref 3.87–5.11)
RDW: 13.7 % (ref 11.5–15.5)
WBC Count: 8.4 10*3/uL (ref 4.0–10.5)
nRBC: 0 % (ref 0.0–0.2)

## 2020-09-04 LAB — CMP (CANCER CENTER ONLY)
ALT: 23 U/L (ref 0–44)
AST: 19 U/L (ref 15–41)
Albumin: 3.7 g/dL (ref 3.5–5.0)
Alkaline Phosphatase: 108 U/L (ref 38–126)
Anion gap: 10 (ref 5–15)
BUN: 9 mg/dL (ref 6–20)
CO2: 28 mmol/L (ref 22–32)
Calcium: 9.3 mg/dL (ref 8.9–10.3)
Chloride: 104 mmol/L (ref 98–111)
Creatinine: 0.79 mg/dL (ref 0.44–1.00)
GFR, Estimated: >60 mL/min (ref >60)
Glucose, Bld: 155 mg/dL — ABNORMAL HIGH (ref 70–99)
Potassium: 4.1 mmol/L (ref 3.5–5.1)
Sodium: 142 mmol/L (ref 135–145)
Total Bilirubin: 0.5 mg/dL (ref 0.3–1.2)
Total Protein: 7.4 g/dL (ref 6.5–8.1)

## 2020-09-04 MED ORDER — LANREOTIDE ACETATE 120 MG/0.5ML ~~LOC~~ SOLN
120.0000 mg | Freq: Once | SUBCUTANEOUS | Status: AC
  Administered 2020-09-04: 120 mg via SUBCUTANEOUS
  Filled 2020-09-04: qty 120

## 2020-09-04 NOTE — Telephone Encounter (Signed)
Received vm message from patient asking when to expect her next PET scan.  Please advise.

## 2020-09-04 NOTE — Patient Instructions (Signed)
Lanreotide injection What is this medicine? LANREOTIDE (lan REE oh tide) is used to reduce blood levels of growth hormone in patients with a condition called acromegaly. It also works to slow or stop tumor growth in patients with neuroendocrine tumors and treat carcinoid syndrome. This medicine may be used for other purposes; ask your health care provider or pharmacist if you have questions. COMMON BRAND NAME(S): Somatuline Depot What should I tell my health care provider before I take this medicine? They need to know if you have any of these conditions:  diabetes  gallbladder disease  heart disease  kidney disease  liver disease  thyroid disease  an unusual or allergic reaction to lanreotide, other medicines, foods, dyes, or preservatives  pregnant or trying to get pregnant  breast-feeding How should I use this medicine? This medicine is for injection under the skin. It is given by a health care professional in a hospital or clinic setting. Contact your pediatrician or health care professional regarding the use of this medicine in children. Special care may be needed. Overdosage: If you think you have taken too much of this medicine contact a poison control center or emergency room at once. NOTE: This medicine is only for you. Do not share this medicine with others. What if I miss a dose? It is important not to miss your dose. Call your doctor or health care professional if you are unable to keep an appointment. What may interact with this medicine? This medicine may interact with the following medications:  bromocriptine  cyclosporine  certain medicines for blood pressure, heart disease, irregular heart beat  certain medicines for diabetes  quinidine  terfenadine This list may not describe all possible interactions. Give your health care provider a list of all the medicines, herbs, non-prescription drugs, or dietary supplements you use. Also tell them if you smoke,  drink alcohol, or use illegal drugs. Some items may interact with your medicine. What should I watch for while using this medicine? Tell your doctor or healthcare professional if your symptoms do not start to get better or if they get worse. Visit your doctor or health care professional for regular checks on your progress. Your condition will be monitored carefully while you are receiving this medicine. This medicine may increase blood sugar. Ask your healthcare provider if changes in diet or medicines are needed if you have diabetes. You may need blood work done while you are taking this medicine. Women should inform their doctor if they wish to become pregnant or think they might be pregnant. There is a potential for serious side effects to an unborn child. Talk to your health care professional or pharmacist for more information. Do not breast-feed an infant while taking this medicine or for 6 months after stopping it. This medicine has caused ovarian failure in some women. This medicine may interfere with the ability to have a child. Talk with your doctor or health care professional if you are concerned about your fertility. What side effects may I notice from receiving this medicine? Side effects that you should report to your doctor or health care professional as soon as possible:  allergic reactions like skin rash, itching or hives, swelling of the face, lips, or tongue  increased blood pressure  severe stomach pain  signs and symptoms of hgh blood sugar such as being more thirsty or hungry or having to urinate more than normal. You may also feel very tired or have blurry vision.  signs and symptoms of low blood   sugar such as feeling anxious; confusion; dizziness; increased hunger; unusually weak or tired; sweating; shakiness; cold; irritable; headache; blurred vision; fast heartbeat; loss of consciousness  unusually slow heartbeat Side effects that usually do not require medical  attention (report to your doctor or health care professional if they continue or are bothersome):  constipation  diarrhea  dizziness  headache  muscle pain  muscle spasms  nausea  pain, redness, or irritation at site where injected This list may not describe all possible side effects. Call your doctor for medical advice about side effects. You may report side effects to FDA at 1-800-FDA-1088. Where should I keep my medicine? This drug is given in a hospital or clinic and will not be stored at home. NOTE: This sheet is a summary. It may not cover all possible information. If you have questions about this medicine, talk to your doctor, pharmacist, or health care provider.  2021 Elsevier/Gold Standard (2018-03-29 09:13:08)  

## 2020-09-07 LAB — CHROMOGRANIN A: Chromogranin A (ng/mL): 30.2 ng/mL (ref 0.0–101.8)

## 2020-09-09 ENCOUNTER — Telehealth: Payer: Self-pay | Admitting: *Deleted

## 2020-09-09 NOTE — Telephone Encounter (Signed)
TCT patient regarding her PET scan. Spoke with patient and provided her the # to call for her PET Scan, which was due in January. It is unclear if a call from [facility] was made for scheduling purposes. Pt stated she would call. No authorization is needed for PET at this time.

## 2020-09-09 NOTE — Telephone Encounter (Signed)
Scan was due in January. It was ordered but not carried out. Please have call to have this scheduled as soon as is feasible.

## 2020-09-18 ENCOUNTER — Other Ambulatory Visit: Payer: Self-pay

## 2020-09-21 ENCOUNTER — Encounter: Payer: 59 | Admitting: Internal Medicine

## 2020-09-21 ENCOUNTER — Other Ambulatory Visit (INDEPENDENT_AMBULATORY_CARE_PROVIDER_SITE_OTHER): Payer: 59

## 2020-09-21 ENCOUNTER — Ambulatory Visit (HOSPITAL_COMMUNITY): Admission: RE | Admit: 2020-09-21 | Payer: 59 | Source: Ambulatory Visit

## 2020-09-21 ENCOUNTER — Other Ambulatory Visit: Payer: Self-pay

## 2020-09-21 DIAGNOSIS — R739 Hyperglycemia, unspecified: Secondary | ICD-10-CM | POA: Diagnosis not present

## 2020-09-21 DIAGNOSIS — E559 Vitamin D deficiency, unspecified: Secondary | ICD-10-CM | POA: Diagnosis not present

## 2020-09-21 DIAGNOSIS — Z0000 Encounter for general adult medical examination without abnormal findings: Secondary | ICD-10-CM

## 2020-09-21 LAB — LIPID PANEL
Cholesterol: 196 mg/dL (ref 0–200)
HDL: 77.2 mg/dL (ref >39.00)
LDL Cholesterol: 106 mg/dL — ABNORMAL HIGH (ref 0–99)
NonHDL: 119.04
Total CHOL/HDL Ratio: 3
Triglycerides: 64 mg/dL (ref 0.0–149.0)
VLDL: 12.8 mg/dL (ref 0.0–40.0)

## 2020-09-21 LAB — CBC WITH DIFFERENTIAL/PLATELET
Basophils Absolute: 0 10*3/uL (ref 0.0–0.1)
Basophils Relative: 0.5 % (ref 0.0–3.0)
Eosinophils Absolute: 0.2 10*3/uL (ref 0.0–0.7)
Eosinophils Relative: 2.3 % (ref 0.0–5.0)
HCT: 40.8 % (ref 36.0–46.0)
Hemoglobin: 13.3 g/dL (ref 12.0–15.0)
Lymphocytes Relative: 50.9 % — ABNORMAL HIGH (ref 12.0–46.0)
Lymphs Abs: 4.6 10*3/uL — ABNORMAL HIGH (ref 0.7–4.0)
MCHC: 32.5 g/dL (ref 30.0–36.0)
MCV: 83.3 fl (ref 78.0–100.0)
Monocytes Absolute: 0.8 10*3/uL (ref 0.1–1.0)
Monocytes Relative: 8.8 % (ref 3.0–12.0)
Neutro Abs: 3.4 10*3/uL (ref 1.4–7.7)
Neutrophils Relative %: 37.5 % — ABNORMAL LOW (ref 43.0–77.0)
Platelets: 293 10*3/uL (ref 150.0–400.0)
RBC: 4.9 Mil/uL (ref 3.87–5.11)
RDW: 13.8 % (ref 11.5–15.5)
WBC: 9 10*3/uL (ref 4.0–10.5)

## 2020-09-21 LAB — BASIC METABOLIC PANEL
BUN: 16 mg/dL (ref 6–23)
CO2: 31 mEq/L (ref 19–32)
Calcium: 10 mg/dL (ref 8.4–10.5)
Chloride: 101 mEq/L (ref 96–112)
Creatinine, Ser: 0.93 mg/dL (ref 0.40–1.20)
GFR: 67.88 mL/min (ref >60.00)
Glucose, Bld: 53 mg/dL — ABNORMAL LOW (ref 70–99)
Potassium: 3.9 mEq/L (ref 3.5–5.1)
Sodium: 139 mEq/L (ref 135–145)

## 2020-09-21 LAB — HEMOGLOBIN A1C: Hgb A1c MFr Bld: 6.4 % (ref 4.6–6.5)

## 2020-09-21 LAB — HEPATIC FUNCTION PANEL
ALT: 15 U/L (ref 0–35)
AST: 16 U/L (ref 0–37)
Albumin: 4.6 g/dL (ref 3.5–5.2)
Alkaline Phosphatase: 88 U/L (ref 39–117)
Bilirubin, Direct: 0 mg/dL (ref 0.0–0.3)
Total Bilirubin: 0.4 mg/dL (ref 0.2–1.2)
Total Protein: 7.8 g/dL (ref 6.0–8.3)

## 2020-09-21 LAB — URINALYSIS, ROUTINE W REFLEX MICROSCOPIC
Bilirubin Urine: NEGATIVE
Ketones, ur: NEGATIVE
Leukocytes,Ua: NEGATIVE
Nitrite: NEGATIVE
Specific Gravity, Urine: 1.015 (ref 1.000–1.030)
Total Protein, Urine: NEGATIVE
Urine Glucose: NEGATIVE
Urobilinogen, UA: 0.2 (ref 0.0–1.0)
pH: 5.5 (ref 5.0–8.0)

## 2020-09-21 LAB — VITAMIN D 25 HYDROXY (VIT D DEFICIENCY, FRACTURES): VITD: 62.09 ng/mL (ref 30.00–100.00)

## 2020-09-21 LAB — TSH: TSH: 0.62 u[IU]/mL (ref 0.35–4.50)

## 2020-09-24 ENCOUNTER — Telehealth: Payer: Self-pay | Admitting: *Deleted

## 2020-09-24 NOTE — Telephone Encounter (Signed)
TCT patient regarding the Peer to Peer needed for her PET scan. Dr. Lorenso Courier attempted Peer to Peer last week but was unable to connect to the insurance company. He is out of the office this week. He will call again when he gets back on 09/28/20

## 2020-09-24 NOTE — Telephone Encounter (Signed)
-----   Message from Orson Slick, MD sent at 09/24/2020  8:12 AM EDT ----- Regarding: RE: peer to peer I attempted to complete the Peer to Peer prior to my vacation week. I was not able to get through in time. I will address this when I get back.  ----- Message ----- From: Otila Kluver, RN Sent: 09/23/2020   3:02 PM EDT To: Orson Slick, MD Subject: peer to peer                                   This patient called regarding the Peer 2 Peer  that needs to done for her PET scan to be approved. I have sent you a couple of messages about this.  Thanks, Drucie Ip, RN

## 2020-09-28 ENCOUNTER — Ambulatory Visit (INDEPENDENT_AMBULATORY_CARE_PROVIDER_SITE_OTHER): Payer: 59 | Admitting: Internal Medicine

## 2020-09-28 ENCOUNTER — Other Ambulatory Visit: Payer: Self-pay

## 2020-09-28 ENCOUNTER — Telehealth: Payer: Self-pay | Admitting: *Deleted

## 2020-09-28 ENCOUNTER — Encounter: Payer: Self-pay | Admitting: Internal Medicine

## 2020-09-28 VITALS — BP 120/80 | HR 96 | Temp 98.6°F | Ht 63.0 in | Wt 194.0 lb

## 2020-09-28 DIAGNOSIS — Z0000 Encounter for general adult medical examination without abnormal findings: Secondary | ICD-10-CM

## 2020-09-28 DIAGNOSIS — J4520 Mild intermittent asthma, uncomplicated: Secondary | ICD-10-CM

## 2020-09-28 DIAGNOSIS — J302 Other seasonal allergic rhinitis: Secondary | ICD-10-CM

## 2020-09-28 DIAGNOSIS — J309 Allergic rhinitis, unspecified: Secondary | ICD-10-CM

## 2020-09-28 DIAGNOSIS — M545 Low back pain, unspecified: Secondary | ICD-10-CM

## 2020-09-28 DIAGNOSIS — G8929 Other chronic pain: Secondary | ICD-10-CM

## 2020-09-28 DIAGNOSIS — R739 Hyperglycemia, unspecified: Secondary | ICD-10-CM

## 2020-09-28 DIAGNOSIS — Z0001 Encounter for general adult medical examination with abnormal findings: Secondary | ICD-10-CM

## 2020-09-28 DIAGNOSIS — E559 Vitamin D deficiency, unspecified: Secondary | ICD-10-CM

## 2020-09-28 MED ORDER — METHYLPREDNISOLONE ACETATE 80 MG/ML IJ SUSP
80.0000 mg | Freq: Once | INTRAMUSCULAR | Status: AC
  Administered 2020-09-28: 80 mg via INTRAMUSCULAR

## 2020-09-28 MED ORDER — OXYCODONE-ACETAMINOPHEN 5-325 MG PO TABS: 1.0000 | ORAL_TABLET | Freq: Four times a day (QID) | ORAL | 0 refills | Status: DC | PRN

## 2020-09-28 NOTE — Patient Instructions (Addendum)
You had the steroid shot today for the allergies  Please continue all other medications as before, and refills have been done  - percocet  Please continue the OTC Vitamin D3 at 2000 units per day, indefinitely.  Please have the pharmacy call with any other refills you may need.  Please continue your efforts at being more active, low cholesterol diet, and weight control.  You are otherwise up to date with prevention measures today.  Please keep your appointments with your specialists as you may have planned  Please make an Appointment to return for your 1 year visit, or sooner if needed, with Lab testing by Appointment as well, to be done about 3-5 days before at the [place] (so this is for TWO appointments - please see the scheduling desk as you leave)  Due to the ongoing Covid 19 pandemic, our lab now requires an appointment for any labs done at our office.  If you need labs done and do not have an appointment, please call our office ahead of time to schedule before presenting to the lab for your testing.

## 2020-09-28 NOTE — Telephone Encounter (Signed)
TCT patient to advise that her PET scan has been approved. No answer but was advised to call [phone] to schedule her scan.  Approval # [id number]

## 2020-09-28 NOTE — Progress Notes (Signed)
Patient ID: Michelle Solomon, female   DOB: [birth year], 60 y.o.   MRN: [id number]         Chief Complaint:: wellness exam and low vit d, allergies, chronic lbp,        HPI:  Michelle Solomon is a 60 y.o. female here for wellness exam. Declines Tdap; but ok for GYn referral . Does not want covid booster for now.                         Also Does have several wks ongoing nasal allergy symptoms with clearish congestion, itch and sneezing, without fever, pain, ST, cough, swelling or wheezing. Taking Vit D. Pt continues to have recurring LBP without change in severity, bowel or bladder change, fever, wt loss,  worsening LE pain/numbness/weakness, gait change or falls.   Pt denies polydipsia, polyuria, Pt denies chest pain, increased sob or doe, wheezing, orthopnea, PND, increased LE swelling, palpitations, dizziness or syncope.  Denies new focal neuro s/s.   Pt denies fever, wt loss, night sweats, loss of appetite, or other constitutional symptoms  Wt Readings from Last 3 Encounters:  10/02/20 192 lb 9.6 oz (87.4 kg)  09/28/20 194 lb (88 kg)  07/06/20 190 lb 6.4 oz (86.4 kg)   BP Readings from Last 3 Encounters:  10/02/20 (!) 129/93  09/28/20 120/80  09/04/20 (!) 171/105   Immunization History  Administered Date(s) Administered  . Influenza Split 04/05/2012  . Influenza Whole 05/05/2008, 04/03/2009  . PFIZER(Purple Top)SARS-COV-2 Vaccination 10/12/2019, 12/03/2019  . Td 05/26/2008   Health Maintenance Due  Topic Date Due  . PAP SMEAR-Modifier  01/01/2010      Past Medical History:  Diagnosis Date  . Acute bronchitis 06/06/2010  . ALLERGIC RHINITIS 05/29/2007  . Allergy    seasonal  . Anal or rectal pain 06/23/2008  . ANEMIA-IRON DEFICIENCY 05/29/2007  . Anxiety   . ASTHMA 05/29/2007  . Asthma   . 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 CONJUNCTIVITIS, ALLERGIC 11/27/2008  . HEMORRHOIDS 06/16/2008  . HTN (hypertension)   . Hx of adenomatous colonic polyps 11/2019  .  NIPPLE DISCHARGE 01/21/2010  . Other specified forms of hearing loss 10/08/2009  . Overweight(278.02) 05/29/2007  . RASH-NONVESICULAR 05/29/2007  . Wheezing 06/22/2010   Past Surgical History:  Procedure Laterality Date  . COLONOSCOPY      reports that she quit smoking about a year ago. Her smoking use included cigarettes. She has a 20.00 pack-year smoking history. She has never used smokeless tobacco. She reports current alcohol use. She reports that she does not use drugs. family history includes Allergies in her sister; Cancer in her paternal aunt; Diabetes in her paternal aunt and sister; Hypertension in her brother, maternal aunt, mother, sister, and sister; Prostate cancer in her brother and father; Stroke in her maternal aunt. No Known Allergies Current Outpatient Medications on File Prior to Visit  Medication Sig Dispense Refill  . ALPRAZolam (XANAX) 0.5 MG tablet Take 1 tablet (0.5 mg total) by mouth 2 (two) times daily as needed for anxiety or sleep. 60 tablet 2  . buPROPion (WELLBUTRIN XL) 300 MG 24 hr tablet TAKE 1 TABLET(300 MG) BY MOUTH DAILY 90 tablet 1  . cholecalciferol (VITAMIN D3) 25 MCG (1000 UNIT) tablet Take 1,000 Units by mouth daily.    [name] Kitchen escitalopram (LEXAPRO) 20 MG tablet Take 20 mg by mouth daily.    . hydrochlorothiazide (HYDRODIURIL) 12.5 MG tablet TAKE 1 TABLET(12.5 MG) BY  MOUTH DAILY 90 tablet 0  . hydrocortisone 2.5 % cream Apply topically 2 (two) times daily. 30 g 1  . iron polysaccharides (NU-IRON) 150 MG capsule Take 1 capsule (150 mg total) by mouth daily. 90 capsule 1  . loratadine (CLARITIN) 10 MG tablet Take 10 mg by mouth daily as needed for allergies.    . Turmeric 500 MG CAPS Take 500 mg by mouth daily.    . vitamin B-12 (CYANOCOBALAMIN) 100 MCG tablet Take 100 mcg by mouth daily.    [name] Kitchen VITAMIN D-VITAMIN K PO Take 1 tablet by mouth daily.    [name] Kitchen zolpidem (AMBIEN) 10 MG tablet Take 1 tablet (10 mg total) by mouth at bedtime as needed for up to 30 days for  sleep. 30 tablet 5   No current facility-administered medications on file prior to visit.        ROS:  All others reviewed and negative.  Objective        PE:  BP 120/80   Pulse 96   Temp 98.6 F (37 C) (Oral)   Ht 5\' 3"  (1.6 m)   Wt 194 lb (88 kg)   LMP 10/26/2017   SpO2 96%   BMI 34.37 kg/m                 Constitutional: Pt appears in NAD               HENT: Head: NCAT.                Right Ear: External ear normal.                 Left Ear: External ear normal.                Eyes: . Pupils are equal, round, and reactive to light. Conjunctivae and EOM are normal               Nose: without d/c or deformity               Neck: Neck supple. Gross normal ROM               Cardiovascular: Normal rate and regular rhythm.                 Pulmonary/Chest: Effort normal and breath sounds without rales or wheezing.                Abd:  Soft, NT, ND, + BS, no organomegaly               Neurological: Pt is alert. At baseline orientation, motor grossly intact               Skin: Skin is warm. No rashes, no other new lesions, LE edema - none               Psychiatric: Pt behavior is normal without agitation   Micro: none  Cardiac tracings I have personally interpreted today:  none  Pertinent Radiological findings (summarize): none   Lab Results  Component Value Date   WBC 10.2 10/02/2020   HGB 13.0 10/02/2020   HCT 39.9 10/02/2020   PLT 286 10/02/2020   GLUCOSE 130 (H) 10/02/2020   CHOL 196 09/21/2020   TRIG 64.0 09/21/2020   HDL 77.20 09/21/2020   LDLCALC 106 (H) 09/21/2020   ALT 17 10/02/2020   AST 14 (L) 10/02/2020   NA 141 10/02/2020   K 3.8 10/02/2020   CL  102 10/02/2020   CREATININE 0.81 10/02/2020   BUN 11 10/02/2020   CO2 27 10/02/2020   TSH 0.62 09/21/2020   HGBA1C 6.4 09/21/2020   Assessment/Plan:  Michelle Solomon is a 59 y.o. Black or African American [2] female with  has a past medical history of Acute bronchitis (06/06/2010), ALLERGIC RHINITIS  (05/29/2007), Allergy, Anal or rectal pain (06/23/2008), ANEMIA-IRON DEFICIENCY (05/29/2007), Anxiety, ASTHMA (05/29/2007), Asthma, Benign carcinoid tumor of the rectum (05/26/2008), CONJUNCTIVITIS, ALLERGIC (11/27/2008), HEMORRHOIDS (06/16/2008), HTN (hypertension), adenomatous colonic polyps (11/2019), NIPPLE DISCHARGE (01/21/2010), Other specified forms of hearing loss (10/08/2009), Overweight(278.02) (05/29/2007), RASH-NONVESICULAR (05/29/2007), and Wheezing (06/22/2010).  Encounter for well adult exam with abnormal findings Age and sex appropriate education and counseling updated with regular exercise and diet Referrals for preventative services - refer gyn for routine care; Immunizations addressed - declines tdap or covid booster Smoking counseling  - none needed Evidence for depression or other mood disorder - none significant Most recent labs reviewed. I have personally reviewed and have noted: 1) the patient's medical and social history 2) The patient's current medications and supplements 3) The patient's height, weight, and BMI have been recorded in the chart   Vitamin D deficiency Last vitamin D Lab Results  Component Value Date   VD25OH 62.09 09/21/2020   Stable, cont oral replacement  Hyperglycemia Lab Results  Component Value Date   HGBA1C 6.4 09/21/2020   Stable, pt to continue current medical treatment  - diet   Back pain Stable, for oxycodone refill,  to f/u any worsening symptoms or concerns  Asthma Stable, cont current med rx - inhaler prn    Allergic rhinitis With seasonal flare, for depomedrol im 80,  to f/u any worsening symptoms or concerns  Followup: Return in about 1 year (around 09/28/2021).  Cathlean Cower, MD 10/07/2020 10:03 PM [facility]

## 2020-09-29 ENCOUNTER — Telehealth: Payer: Self-pay

## 2020-09-29 NOTE — Telephone Encounter (Signed)
Michelle Solomon contacted [place] today requesting that we contact centralized scheduling for her in order to schedule the NM PET (CU-64 DETECTNET) SKULL TO MID THIGH scan. She states that she was advised by centralized scheduling that she couldn't schedule this scan on her own. I contacted centralized scheduling to figure out how to proceed. According to centralized scheduling there is only one scheduler who can schedule this particular scan, Carly Taylor. CS advised me that Jerral Bonito would be in touch with the patient and with [place] to arrange the scan. I have notified the patient of this and she verbalized understanding.

## 2020-09-30 ENCOUNTER — Ambulatory Visit (HOSPITAL_COMMUNITY): Admission: RE | Admit: 2020-09-30 | Payer: 59 | Source: Ambulatory Visit

## 2020-10-02 ENCOUNTER — Other Ambulatory Visit: Payer: Self-pay | Admitting: Hematology and Oncology

## 2020-10-02 ENCOUNTER — Inpatient Hospital Stay: Payer: 59

## 2020-10-02 ENCOUNTER — Inpatient Hospital Stay: Payer: 59 | Attending: Hematology and Oncology | Admitting: Hematology and Oncology

## 2020-10-02 ENCOUNTER — Encounter: Payer: Self-pay | Admitting: Hematology and Oncology

## 2020-10-02 ENCOUNTER — Other Ambulatory Visit: Payer: Self-pay

## 2020-10-02 VITALS — BP 129/93 | HR 88 | Temp 97.8°F | Resp 15 | Ht 63.0 in | Wt 192.6 lb

## 2020-10-02 DIAGNOSIS — C7A026 Malignant carcinoid tumor of the rectum: Secondary | ICD-10-CM | POA: Diagnosis not present

## 2020-10-02 DIAGNOSIS — C7B03 Secondary carcinoid tumors of bone: Secondary | ICD-10-CM | POA: Insufficient documentation

## 2020-10-02 DIAGNOSIS — C7B8 Other secondary neuroendocrine tumors: Secondary | ICD-10-CM

## 2020-10-02 DIAGNOSIS — C7B01 Secondary carcinoid tumors of distant lymph nodes: Secondary | ICD-10-CM | POA: Insufficient documentation

## 2020-10-02 DIAGNOSIS — M899 Disorder of bone, unspecified: Secondary | ICD-10-CM

## 2020-10-02 DIAGNOSIS — E340 Carcinoid syndrome: Secondary | ICD-10-CM | POA: Insufficient documentation

## 2020-10-02 DIAGNOSIS — C7A8 Other malignant neuroendocrine tumors: Secondary | ICD-10-CM | POA: Diagnosis not present

## 2020-10-02 DIAGNOSIS — C7A025 Malignant carcinoid tumor of the sigmoid colon: Secondary | ICD-10-CM | POA: Diagnosis present

## 2020-10-02 LAB — CBC WITH DIFFERENTIAL (CANCER CENTER ONLY)
Abs Immature Granulocytes: 0.02 10*3/uL (ref 0.00–0.07)
Basophils Absolute: 0 10*3/uL (ref 0.0–0.1)
Basophils Relative: 0 %
Eosinophils Absolute: 0.2 10*3/uL (ref 0.0–0.5)
Eosinophils Relative: 2 %
HCT: 39.9 % (ref 36.0–46.0)
Hemoglobin: 13 g/dL (ref 12.0–15.0)
Immature Granulocytes: 0 %
Lymphocytes Relative: 43 %
Lymphs Abs: 4.4 10*3/uL — ABNORMAL HIGH (ref 0.7–4.0)
MCH: 27.1 pg (ref 26.0–34.0)
MCHC: 32.6 g/dL (ref 30.0–36.0)
MCV: 83.3 fL (ref 80.0–100.0)
Monocytes Absolute: 0.8 10*3/uL (ref 0.1–1.0)
Monocytes Relative: 8 %
Neutro Abs: 4.7 10*3/uL (ref 1.7–7.7)
Neutrophils Relative %: 47 %
Platelet Count: 286 10*3/uL (ref 150–400)
RBC: 4.79 MIL/uL (ref 3.87–5.11)
RDW: 13.9 % (ref 11.5–15.5)
WBC Count: 10.2 10*3/uL (ref 4.0–10.5)
nRBC: 0 % (ref 0.0–0.2)

## 2020-10-02 LAB — CMP (CANCER CENTER ONLY)
ALT: 17 U/L (ref 0–44)
AST: 14 U/L — ABNORMAL LOW (ref 15–41)
Albumin: 3.8 g/dL (ref 3.5–5.0)
Alkaline Phosphatase: 107 U/L (ref 38–126)
Anion gap: 12 (ref 5–15)
BUN: 11 mg/dL (ref 6–20)
CO2: 27 mmol/L (ref 22–32)
Calcium: 9.3 mg/dL (ref 8.9–10.3)
Chloride: 102 mmol/L (ref 98–111)
Creatinine: 0.81 mg/dL (ref 0.44–1.00)
GFR, Estimated: >60 mL/min (ref >60)
Glucose, Bld: 130 mg/dL — ABNORMAL HIGH (ref 70–99)
Potassium: 3.8 mmol/L (ref 3.5–5.1)
Sodium: 141 mmol/L (ref 135–145)
Total Bilirubin: 0.5 mg/dL (ref 0.3–1.2)
Total Protein: 7.5 g/dL (ref 6.5–8.1)

## 2020-10-02 MED ORDER — LANREOTIDE ACETATE 120 MG/0.5ML ~~LOC~~ SOLN
120.0000 mg | Freq: Once | SUBCUTANEOUS | Status: AC
  Administered 2020-10-02: 120 mg via SUBCUTANEOUS
  Filled 2020-10-02: qty 120

## 2020-10-02 NOTE — Progress Notes (Signed)
[facility] [phone]   [phone]  PROGRESS NOTE  Patient Care Team: Biagio Borg, MD as PCP - General  Hematological/Oncological History # Metastatic Neuroendocrine Tumor, Metastasis to the Spine 1) 08/26/2019: CT Abdomen Pelvis performed due to RUQ abdominal pain. Imaging showed a soft tissue mass abutting the right posterior eighth rib 2) 09/06/2019: CT chest performed which showed Ill-defined soft tissue nodule along the medial right posterior thoracic wall at the 8th intercostal space to the right of the spine. MRI was recommended. 3) 10/19/2019: MRI Thoracic spine showed enhancing lesions throughout the visualized spine consistent with metastatic disease. Additionally there was a 1.5 cm soft tissue nodule in the posteromedial right eighth intercostal space, more concerning for a metastasis 4) 10/25/2019: establish care with Dr. Lorenso Courier   5) 11/05/2019: attempted biopsy of soft tissue mass at right posterior 8th rib, but lesion had dissipated at time of biopsy attempt 6) 11/27/2019: PET CT scan performed showing right inguinal lymph nodes and osseous lesions, indicative of metastatic disease of unknown primary. 7) 12/11/2019: CT bone marrow biopsy showed metastatic neuroendocrine neoplasm 8) 12/27/2019: start of lanreotide 148m sub q28 days   #Low Grade Neuroendocrine Tumor 1) 2006: reportedly had rectal carcinoid tumor removed 2) 08/14/2008: patient had a flexibile sigmoidoscopy with endoscopic ultrasound which resected an 866m subepithelial lesion in rectum. Findings consistent with low grade neuroendocrine tumor. 3) 04/08/2010: repeat colonoscopy, no residual tumor. Repeat recommended in 2016.  4) 12/11/2019: metastatic recurrence found on biopsy, noted above.  5) 12/27/2019: start of lanreotide 12041mub q28 days   Interval History:  Michelle Solomon 34o. female with medical history significant for metastatic neuroendocrine tumor of the colon who  presents for a follow up visit. The patient's last visit was on 07/06/2020 to assure continued tolerance of lanreotide therapy. In the interim since the last visit the patient has had no hospitalizations or ED visits and has continued with monthly lanreotide.   On exam today Michelle Solomon that she has been well in the interim since her last visit.  She notes that she had no major changes in health or her medications.  She reports that her appetite is good and her weight has been stable.  She does have some occasional numbness in her right leg but has otherwise been quite well.  She notes that she is tolerating the shots well without any nausea, vomiting, or diarrhea.  She does not have any other questions or concerns today.  A full 10 point ROS is listed below.  MEDICAL HISTORY:  Past Medical History:  Diagnosis Date  . Acute bronchitis 06/06/2010  . ALLERGIC RHINITIS 05/29/2007  . Allergy    seasonal  . Anal or rectal pain 06/23/2008  . ANEMIA-IRON DEFICIENCY 05/29/2007  . Anxiety   . ASTHMA 05/29/2007  . Asthma   . 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 CONJUNCTIVITIS, ALLERGIC 11/27/2008  . HEMORRHOIDS 06/16/2008  . HTN (hypertension)   . Hx of adenomatous colonic polyps 11/2019  . NIPPLE DISCHARGE 01/21/2010  . Other specified forms of hearing loss 10/08/2009  . Overweight(278.02) 05/29/2007  . RASH-NONVESICULAR 05/29/2007  . Wheezing 06/22/2010    SURGICAL HISTORY: Past Surgical History:  Procedure Laterality Date  . COLONOSCOPY      SOCIAL HISTORY: Social History   Socioeconomic History  . Marital status: Married    Spouse name: Not on file  . Number of children: 3  . Years of education: Not on file  . Highest  education level: Not on file  Occupational History  . Occupation: CARD REPLACEMEN    Employer: [employer]  . Occupation: customer service  Tobacco Use  . Smoking status: Former Smoker    Packs/day: 0.50    Years: 40.00    Pack years: 20.00     Types: Cigarettes    Quit date: 10/22/2019    Years since quitting: 0.9  . Smokeless tobacco: Never Used  . Tobacco comment: 1 pack weekly.  started smoking at 60 years old  Vaping Use  . Vaping Use: Never used  Substance and Sexual Activity  . Alcohol use: Yes    Comment: ocassional  . Drug use: No  . Sexual activity: Not on file  Other Topics Concern  . Not on file  Social History Narrative   Daily caffeine use 3   Patient does not get regular exercise   Social Determinants of Health   Financial Resource Strain: Not on file  Food Insecurity: Not on file  Transportation Needs: Not on file  Physical Activity: Not on file  Stress: Not on file  Social Connections: Not on file  Intimate Partner Violence: Not on file    FAMILY HISTORY: Family History  Problem Relation Age of Onset  . Allergies Sister   . Diabetes Sister   . Hypertension Sister   . Hypertension Mother   . Prostate cancer Father   . Hypertension Brother   . Hypertension Sister   . Diabetes Paternal Aunt   . Cancer Paternal Aunt        type unknown  . Prostate cancer Brother   . Stroke Maternal Aunt   . Hypertension Maternal Aunt   . Colon cancer Neg Hx   . Esophageal cancer Neg Hx   . Rectal cancer Neg Hx   . Stomach cancer Neg Hx     ALLERGIES:  has No Known Allergies.  MEDICATIONS:  Current Outpatient Medications  Medication Sig Dispense Refill  . ALPRAZolam (XANAX) 0.5 MG tablet Take 1 tablet (0.5 mg total) by mouth 2 (two) times daily as needed for anxiety or sleep. 60 tablet 2  . buPROPion (WELLBUTRIN XL) 300 MG 24 hr tablet TAKE 1 TABLET(300 MG) BY MOUTH DAILY 90 tablet 1  . cholecalciferol (VITAMIN D3) 25 MCG (1000 UNIT) tablet Take 1,000 Units by mouth daily.    [name] Kitchen escitalopram (LEXAPRO) 20 MG tablet Take 20 mg by mouth daily.    . hydrochlorothiazide (HYDRODIURIL) 12.5 MG tablet TAKE 1 TABLET(12.5 MG) BY MOUTH DAILY 90 tablet 0  . hydrocortisone 2.5 % cream Apply topically 2 (two) times  daily. 30 g 1  . iron polysaccharides (NU-IRON) 150 MG capsule Take 1 capsule (150 mg total) by mouth daily. 90 capsule 1  . loratadine (CLARITIN) 10 MG tablet Take 10 mg by mouth daily as needed for allergies.    [name] Kitchen oxyCODONE-acetaminophen (PERCOCET/ROXICET) 5-325 MG tablet Take 1 tablet by mouth every 6 (six) hours as needed for severe pain. 30 tablet 0  . Turmeric 500 MG CAPS Take 500 mg by mouth daily.    . vitamin B-12 (CYANOCOBALAMIN) 100 MCG tablet Take 100 mcg by mouth daily.    [name] Kitchen VITAMIN D-VITAMIN K PO Take 1 tablet by mouth daily.    [name] Kitchen zolpidem (AMBIEN) 10 MG tablet Take 1 tablet (10 mg total) by mouth at bedtime as needed for up to 30 days for sleep. 30 tablet 5   No current facility-administered medications for this visit.    REVIEW OF  SYSTEMS:   Constitutional: ( - ) fevers, ( - )  chills , ( - ) night sweats Eyes: ( - ) blurriness of vision, ( - ) double vision, ( - ) watery eyes Ears, nose, mouth, throat, and face: ( - ) mucositis, ( - ) sore throat Respiratory: ( - ) cough, ( - ) dyspnea, ( - ) wheezes Cardiovascular: ( - ) palpitation, ( - ) chest discomfort, ( - ) lower extremity swelling Gastrointestinal:  ( - ) nausea, ( - ) heartburn, ( - ) change in bowel habits Skin: ( - ) abnormal skin rashes Lymphatics: ( - ) new lymphadenopathy, ( - ) easy bruising Neurological: ( - ) numbness, ( - ) tingling, ( - ) new weaknesses Behavioral/Psych: ( - ) mood change, ( - ) new changes  All other systems were reviewed with the patient and are negative.  PHYSICAL EXAMINATION: ECOG PERFORMANCE STATUS: 0 - Asymptomatic  Vitals:   10/02/20 1038  BP: (!) 129/93  Pulse: 88  Resp: 15  Temp: 97.8 F (36.6 C)  SpO2: 100%   Filed Weights   10/02/20 1038  Weight: 192 lb 9.6 oz (87.4 kg)    GENERAL: well appearing middle aged [place] American female in NAD  SKIN: skin color, texture, turgor are normal, no rashes or significant lesions EYES: conjunctiva are pink and  non-injected, sclera clear LUNGS: clear to auscultation and percussion with normal breathing effort HEART: regular rate & rhythm and no murmurs and no lower extremity edema Musculoskeletal: no cyanosis of digits and no clubbing  PSYCH: alert & oriented x 3, fluent speech NEURO: no focal motor/sensory deficits  LABORATORY DATA:  I have reviewed the data as listed CBC Latest Ref Rng & Units 10/02/2020 09/21/2020 09/04/2020  WBC 4.0 - 10.5 K/uL 10.2 9.0 8.4  Hemoglobin 12.0 - 15.0 g/dL 13.0 13.3 12.8  Hematocrit 36.0 - 46.0 % 39.9 40.8 40.1  Platelets 150 - 400 K/uL 286 293.0 270    CMP Latest Ref Rng & Units 10/02/2020 09/21/2020 09/04/2020  Glucose 70 - 99 mg/dL 130(H) 53(L) 155(H)  BUN 6 - 20 mg/dL 11 16 9   Creatinine 0.44 - 1.00 mg/dL 0.81 0.93 0.79  Sodium 135 - 145 mmol/L 141 139 142  Potassium 3.5 - 5.1 mmol/L 3.8 3.9 4.1  Chloride 98 - 111 mmol/L 102 101 104  CO2 22 - 32 mmol/L 27 31 28   Calcium 8.9 - 10.3 mg/dL 9.3 10.0 9.3  Total Protein 6.5 - 8.1 g/dL 7.5 7.8 7.4  Total Bilirubin 0.3 - 1.2 mg/dL 0.5 0.4 0.5  Alkaline Phos 38 - 126 U/L 107 88 108  AST 15 - 41 U/L 14(L) 16 19  ALT 0 - 44 U/L 17 15 23     RADIOGRAPHIC STUDIES: I have personally reviewed the radiological images as listed and agreed with the findings in the report: increased FDG intake in several areas of the spine and inguinal lymph nodes.  No results found.  ASSESSMENT & PLAN Ariya Westrich 60 y.o. female with medical history significant for metastatic neuroendocrine tumor of the colon who presents for a follow up visit.  After review of the labs, review the pathology, and review the PET CT imaging the findings are most consistent with metastatic neuroendocrine tumor with metastasis to the spine.  The pathology currently appears to be consistent with the neuroendocrine tumor which was previously resected from the patient's rectum.  On exam today Ms. Charo notes she is tolerating treatment well and ready and  able to  proceed with continued lanreotide therapy.  Her next dose is 10/30/2020 (after her dose today).   Previously we discussed the nature of metastatic neuroendocrine tumor and the treatment options moving forward.  Fortunately this is a slow-growing tumor and observation would be an appropriate treatment option.  I noted that we could do serial imaging to monitor the progression of the tumor and determine if treatment was required.  If the patient wanted to proceed with treatment we could consider lanreotide or octreotide subcu q. 28 days for control of the disease.  I noted that the side effects that could be expected would be nausea, vomiting, diarrhea, abdominal pain, and injection site reactions.  The patient noted that she would prefer to proceed with treatment.  We will plan to continue lanreotide 120 mg subcu q. 28 days with interval imaging approximately 3 months from the start of treatment.  The patient voiced her understanding of this plan and was eager to move forward.  # Metastatic Neuroendocrine Tumor, Metastasis to the Spine  --previously discussed with patient that the options would include observation with serial imaging or starting lanreotide therapy. The patient opted to start treatment --will plan for continued lanreotide 149m subq q28 days. Started therapy on 12/27/2019. This will be continued until progression or intolerance to therapy. Next dose due on 10/30/2020 (Friday)  --PET CT scan to be performed on 10/14/2020  --RTC in 3 month for interval visit.    No orders of the defined types were placed in this encounter.  All questions were answered. The patient knows to call the clinic with any problems, questions or concerns.  A total of more than 30 minutes were spent on this encounter and over half of that time was spent on counseling and coordination of care as outlined above.   JLedell Peoples MD Department of Hematology/Oncology [facility] JerseyPhone: [phone]: [phone]: [email or web address]  10/02/2020 12:38 PM

## 2020-10-02 NOTE — Patient Instructions (Signed)
Lanreotide injection What is this medicine? LANREOTIDE (lan REE oh tide) is used to reduce blood levels of growth hormone in patients with a condition called acromegaly. It also works to slow or stop tumor growth in patients with neuroendocrine tumors and treat carcinoid syndrome. This medicine may be used for other purposes; ask your health care provider or pharmacist if you have questions. COMMON BRAND NAME(S): Somatuline Depot What should I tell my health care provider before I take this medicine? They need to know if you have any of these conditions:  diabetes  gallbladder disease  heart disease  kidney disease  liver disease  thyroid disease  an unusual or allergic reaction to lanreotide, other medicines, foods, dyes, or preservatives  pregnant or trying to get pregnant  breast-feeding How should I use this medicine? This medicine is for injection under the skin. It is given by a health care professional in a hospital or clinic setting. Contact your pediatrician or health care professional regarding the use of this medicine in children. Special care may be needed. Overdosage: If you think you have taken too much of this medicine contact a poison control center or emergency room at once. NOTE: This medicine is only for you. Do not share this medicine with others. What if I miss a dose? It is important not to miss your dose. Call your doctor or health care professional if you are unable to keep an appointment. What may interact with this medicine? This medicine may interact with the following medications:  bromocriptine  cyclosporine  certain medicines for blood pressure, heart disease, irregular heart beat  certain medicines for diabetes  quinidine  terfenadine This list may not describe all possible interactions. Give your health care provider a list of all the medicines, herbs, non-prescription drugs, or dietary supplements you use. Also tell them if you smoke,  drink alcohol, or use illegal drugs. Some items may interact with your medicine. What should I watch for while using this medicine? Tell your doctor or healthcare professional if your symptoms do not start to get better or if they get worse. Visit your doctor or health care professional for regular checks on your progress. Your condition will be monitored carefully while you are receiving this medicine. This medicine may increase blood sugar. Ask your healthcare provider if changes in diet or medicines are needed if you have diabetes. You may need blood work done while you are taking this medicine. Women should inform their doctor if they wish to become pregnant or think they might be pregnant. There is a potential for serious side effects to an unborn child. Talk to your health care professional or pharmacist for more information. Do not breast-feed an infant while taking this medicine or for 6 months after stopping it. This medicine has caused ovarian failure in some women. This medicine may interfere with the ability to have a child. Talk with your doctor or health care professional if you are concerned about your fertility. What side effects may I notice from receiving this medicine? Side effects that you should report to your doctor or health care professional as soon as possible:  allergic reactions like skin rash, itching or hives, swelling of the face, lips, or tongue  increased blood pressure  severe stomach pain  signs and symptoms of hgh blood sugar such as being more thirsty or hungry or having to urinate more than normal. You may also feel very tired or have blurry vision.  signs and symptoms of low blood   sugar such as feeling anxious; confusion; dizziness; increased hunger; unusually weak or tired; sweating; shakiness; cold; irritable; headache; blurred vision; fast heartbeat; loss of consciousness  unusually slow heartbeat Side effects that usually do not require medical  attention (report to your doctor or health care professional if they continue or are bothersome):  constipation  diarrhea  dizziness  headache  muscle pain  muscle spasms  nausea  pain, redness, or irritation at site where injected This list may not describe all possible side effects. Call your doctor for medical advice about side effects. You may report side effects to FDA at 1-800-FDA-1088. Where should I keep my medicine? This drug is given in a hospital or clinic and will not be stored at home. NOTE: This sheet is a summary. It may not cover all possible information. If you have questions about this medicine, talk to your doctor, pharmacist, or health care provider.  2021 Elsevier/Gold Standard (2018-03-29 09:13:08)  

## 2020-10-06 ENCOUNTER — Telehealth: Payer: Self-pay | Admitting: Hematology and Oncology

## 2020-10-06 LAB — CHROMOGRANIN A: Chromogranin A (ng/mL): 17.2 ng/mL (ref 0.0–101.8)

## 2020-10-06 NOTE — Telephone Encounter (Signed)
Scheduled per los. Called and left msg. Mailed printout  °

## 2020-10-07 ENCOUNTER — Encounter: Payer: Self-pay | Admitting: Internal Medicine

## 2020-10-07 NOTE — Assessment & Plan Note (Signed)
Age and sex appropriate education and counseling updated with regular exercise and diet Referrals for preventative services - refer gyn for routine care; Immunizations addressed - declines tdap or covid booster Smoking counseling  - none needed Evidence for depression or other mood disorder - none significant Most recent labs reviewed. I have personally reviewed and have noted: 1) the patient's medical and social history 2) The patient's current medications and supplements 3) The patient's height, weight, and BMI have been recorded in the chart

## 2020-10-07 NOTE — Assessment & Plan Note (Signed)
With seasonal flare, for depomedrol im 80,  to f/u any worsening symptoms or concerns

## 2020-10-07 NOTE — Addendum Note (Signed)
Addended by: Biagio Borg on: 10/07/2020 10:06 PM   Modules accepted: Orders

## 2020-10-07 NOTE — Assessment & Plan Note (Signed)
Stable, for oxycodone refill,  to f/u any worsening symptoms or concerns

## 2020-10-07 NOTE — Assessment & Plan Note (Signed)
Last vitamin D Lab Results  Component Value Date   VD25OH 62.09 09/21/2020   Stable, cont oral replacement

## 2020-10-07 NOTE — Assessment & Plan Note (Signed)
Lab Results  Component Value Date   HGBA1C 6.4 09/21/2020   Stable, pt to continue current medical treatment  - diet

## 2020-10-07 NOTE — Assessment & Plan Note (Signed)
Stable, cont current med rx - inhaler prn

## 2020-10-08 ENCOUNTER — Telehealth: Payer: Self-pay | Admitting: *Deleted

## 2020-10-08 NOTE — Telephone Encounter (Signed)
TCT patient regarding her lab results. No answer but was able to to leave vm message for pt to return call at her convenience  [phone]

## 2020-10-08 NOTE — Telephone Encounter (Signed)
-----   Message from Orson Slick, MD sent at 10/06/2020  9:06 AM EDT ----- Please let Mrs. Fesler know that her Chromogranin levels are steady and low. This is reassuring. Her scan is coming up on 10/15/2020. We should be seeing her in about 3 months with interval monthly shots (these arent' scheduled yet).   ----- Message ----- From: Buel Ream, [facility] [place] Sent: 10/02/2020  10:19 AM EDT To: Orson Slick, MD

## 2020-10-14 ENCOUNTER — Ambulatory Visit (HOSPITAL_COMMUNITY): Admission: RE | Admit: 2020-10-14 | Payer: 59 | Source: Ambulatory Visit

## 2020-10-15 ENCOUNTER — Ambulatory Visit (HOSPITAL_COMMUNITY)
Admission: RE | Admit: 2020-10-15 | Discharge: 2020-10-15 | Disposition: A | Discharge status: Home / Self Care | Payer: 59 | Source: Ambulatory Visit | Attending: Hematology and Oncology | Admitting: Hematology and Oncology

## 2020-10-15 ENCOUNTER — Other Ambulatory Visit: Payer: Self-pay

## 2020-10-15 DIAGNOSIS — C7B8 Other secondary neuroendocrine tumors: Secondary | ICD-10-CM | POA: Diagnosis present

## 2020-10-15 MED ORDER — COPPER CU 64 DOTATATE 1 MCI/ML IV SOLN
4.0000 | Freq: Once | INTRAVENOUS | Status: AC
  Administered 2020-10-15: 3.85 via INTRAVENOUS

## 2020-10-19 ENCOUNTER — Encounter: Payer: Self-pay | Admitting: Nurse Practitioner

## 2020-10-19 ENCOUNTER — Other Ambulatory Visit (HOSPITAL_COMMUNITY)
Admission: RE | Admit: 2020-10-19 | Discharge: 2020-10-19 | Disposition: A | Discharge status: Home / Self Care | Payer: 59 | Source: Ambulatory Visit | Attending: Nurse Practitioner | Admitting: Nurse Practitioner

## 2020-10-19 ENCOUNTER — Ambulatory Visit (INDEPENDENT_AMBULATORY_CARE_PROVIDER_SITE_OTHER): Payer: 59 | Admitting: Nurse Practitioner

## 2020-10-19 ENCOUNTER — Other Ambulatory Visit: Payer: Self-pay

## 2020-10-19 VITALS — BP 124/82 | Ht 63.0 in | Wt 196.0 lb

## 2020-10-19 DIAGNOSIS — N6321 Unspecified lump in the left breast, upper outer quadrant: Secondary | ICD-10-CM | POA: Diagnosis not present

## 2020-10-19 DIAGNOSIS — Z01419 Encounter for gynecological examination (general) (routine) without abnormal findings: Secondary | ICD-10-CM

## 2020-10-19 NOTE — Progress Notes (Addendum)
   Michelle Solomon 1961-02-05 [id number]   History:  60 y.o. G3P3 presents as new patient to establish care. No GYN complaints. Postmenopausal - no HRT, no bleeding. Normal pap history but she is overdue. History of metastatic neuroendocrine tumor of colon, receiving Lanreotide monthly managed by oncology.   Gynecologic History Patient's last menstrual period was 10/26/2017.   Contraception/Family planning: post menopausal status  Health Maintenance Last Pap: 2008. Results were: Normal Last mammogram: 08/2020. Results were: Normal per patient but we do not have report Last colonoscopy: 11/2019 Last Dexa: N/A  Past medical history, past surgical history, family history and social history were all reviewed and documented in the EPIC chart. Widowed. 2 grandchildren ages 39 and 62.  ROS:  A ROS was performed and pertinent positives and negatives are included.  Exam:  Vitals:   10/19/20 1437  BP: 124/82  Weight: 196 lb (88.9 kg)  Height: 5\' 3"  (1.6 m)   Body mass index is 34.72 kg/m.  General appearance:  Normal Thyroid:  Symmetrical, normal in size, without palpable masses or nodularity. Respiratory  Auscultation:  Clear without wheezing or rhonchi Cardiovascular  Auscultation:  Regular rate, without rubs, murmurs or gallops  Edema/varicosities:  Not grossly evident Abdominal  Soft,nontender, without masses, guarding or rebound.  Liver/spleen:  No organomegaly noted  Hernia:  None appreciated  Skin  Inspection:  Grossly normal Breasts: Examined lying and sitting.   Right: Without masses, retractions, nipple discharge or axillary adenopathy.   Left: 1 cm mobile, non-tender lump @ 2 o'clock about 3 cm from nipple. Without retractions, nipple discharge or axillary adenopathy. Gentitourinary   Inguinal/mons:  Normal without inguinal adenopathy  External genitalia:  Normal appearing vulva with no masses, tenderness, or lesions  BUS/Urethra/Skene's glands:  Normal  Vagina:   Normal appearing with normal color and discharge, no lesions  Cervix:  Normal appearing without discharge or lesions  Uterus:  Normal in size, shape and contour.  Midline and mobile, nontender  Adnexa/parametria:     Rt: Normal in size, without masses or tenderness.   Lt: Normal in size, without masses or tenderness.  Anus and perineum: Normal  Digital rectal exam: Normal sphincter tone without palpated masses or tenderness  Assessment/Plan:  60 y.o. G3P3 for annual exam.   Well female exam with routine gynecological exam - Education provided on SBEs, importance of preventative screenings, current guidelines, high calcium diet, regular exercise, and multivitamin daily. Labs with PCP.   Breast lump on left side at 2 o'clock position - mobile, non-tender 1 cm lump felt about 3 cm from nipple. Requested mammogram results from February - patient will have them sent here. History of benign breast cysts. 2019 mammogram shows stable benign cysts and calcifications of RIGHT breast. If not noted on imaging we will send for left breast ultrasound.   Screening for cervical cancer - Normal Pap history, although she is overdue. Pap with HR HPV today.   Screening for breast cancer - History of benign cysts and calcifications.  Continue annual screenings.  Mobile, non-tender 1 cm lump felt about 3 cm from nipple on left breast. She is going to send mammogram report from February 2022 for comparison.  Screening for colon cancer - 2021 colonoscopy. Will repeat at GI's recommended interval.   Return in 1 year for annual.   Tamela Gammon DNP, 3:28 PM 10/19/2020

## 2020-10-19 NOTE — Patient Instructions (Signed)

## 2020-10-21 LAB — CYTOLOGY - PAP
Comment: NEGATIVE
Diagnosis: NEGATIVE
High risk HPV: NEGATIVE

## 2020-10-26 ENCOUNTER — Other Ambulatory Visit (HOSPITAL_COMMUNITY): Payer: Self-pay | Admitting: Hematology and Oncology

## 2020-10-26 ENCOUNTER — Inpatient Hospital Stay (HOSPITAL_BASED_OUTPATIENT_CLINIC_OR_DEPARTMENT_OTHER): Payer: 59 | Admitting: Hematology and Oncology

## 2020-10-26 ENCOUNTER — Other Ambulatory Visit: Payer: Self-pay

## 2020-10-26 VITALS — BP 145/102 | HR 99 | Temp 98.3°F | Resp 17 | Wt 193.7 lb

## 2020-10-26 DIAGNOSIS — C7B8 Other secondary neuroendocrine tumors: Secondary | ICD-10-CM | POA: Diagnosis not present

## 2020-10-26 DIAGNOSIS — M899 Disorder of bone, unspecified: Secondary | ICD-10-CM | POA: Diagnosis not present

## 2020-10-26 DIAGNOSIS — C7A8 Other malignant neuroendocrine tumors: Secondary | ICD-10-CM | POA: Diagnosis not present

## 2020-10-27 NOTE — Progress Notes (Signed)
[facility] [phone]   [phone]  PROGRESS NOTE  Patient Care Team: Biagio Borg, MD as PCP - General  Hematological/Oncological History # Metastatic Neuroendocrine Tumor, Metastasis to the Spine 1) 08/26/2019: CT Abdomen Pelvis performed due to RUQ abdominal pain. Imaging showed a soft tissue mass abutting the right posterior eighth rib 2) 09/06/2019: CT chest performed which showed Ill-defined soft tissue nodule along the medial right posterior thoracic wall at the 8th intercostal space to the right of the spine. MRI was recommended. 3) 10/19/2019: MRI Thoracic spine showed enhancing lesions throughout the visualized spine consistent with metastatic disease. Additionally there was a 1.5 cm soft tissue nodule in the posteromedial right eighth intercostal space, more concerning for a metastasis 4) 10/25/2019: establish care with Dr. Lorenso Courier   5) 11/05/2019: attempted biopsy of soft tissue mass at right posterior 8th rib, but lesion had dissipated at time of biopsy attempt 6) 11/27/2019: PET CT scan performed showing right inguinal lymph nodes and osseous lesions, indicative of metastatic disease of unknown primary. 7) 12/11/2019: CT bone marrow biopsy showed metastatic neuroendocrine neoplasm 8) 12/27/2019: start of lanreotide 153m sub q28 days   #Low Grade Neuroendocrine Tumor 1) 2006: reportedly had rectal carcinoid tumor removed 2) 08/14/2008: patient had a flexibile sigmoidoscopy with endoscopic ultrasound which resected an 879m subepithelial lesion in rectum. Findings consistent with low grade neuroendocrine tumor. 3) 04/08/2010: repeat colonoscopy, no residual tumor. Repeat recommended in 2016.  4) 12/11/2019: metastatic recurrence found on biopsy, noted above.  5) 12/27/2019: start of lanreotide 12055mub q28 days  6) 10/15/2020: NM PET (CU-64) scan showed widespread osseous and lymphatic uptake consistent with metastatic spread of neuroendocrine tumor.  This  is more avid than prior.  Interval History:  Michelle Solomon 55o. female with medical history significant for metastatic neuroendocrine tumor of the colon who presents for a follow up visit. The patient's last visit was on 10/02/2020 to assure continued tolerance of lanreotide therapy. In the interim since the last visit the patient has had a repeat NM PET CU-64 scan which showed metastatic spread of neuroendocrine tumor, more avid than prior.   On exam today Ms. CarArmorports that that she has had no major changes in health or her medications since our last visit.  She reports that her appetite is good and her weight has been stable.  She does have some occasional numbness in her right leg but has otherwise been quite well.  She notes that she is tolerating the shots well without any nausea, vomiting, or diarrhea.  She does not have any other questions or concerns today.  A full 10 point ROS is listed below.  MEDICAL HISTORY:  Past Medical History:  Diagnosis Date  . Acute bronchitis 06/06/2010  . ALLERGIC RHINITIS 05/29/2007  . Allergy    seasonal  . Anal or rectal pain 06/23/2008  . ANEMIA-IRON DEFICIENCY 05/29/2007  . Anxiety   . ASTHMA 05/29/2007  . Asthma   . 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 [facility] [address]  Neuro endocrine tumor  . CONJUNCTIVITIS, ALLERGIC 11/27/2008  . HEMORRHOIDS 06/16/2008  . HTN (hypertension)   . Hx of adenomatous colonic polyps 11/2019  . NIPPLE DISCHARGE 01/21/2010  . Other specified forms of hearing loss 10/08/2009  . Overweight(278.02) 05/29/2007  . RASH-NONVESICULAR 05/29/2007  . Wheezing 06/22/2010    SURGICAL HISTORY: Past Surgical History:  Procedure Laterality Date  . COLONOSCOPY      SOCIAL HISTORY: Social History  Socioeconomic History  . Marital status: Married    Spouse name: Not on file  . Number of children: 3  . Years of education: Not on file  . Highest education level: Not on file  Occupational History  .  Occupation: CARD REPLACEMEN    Employer: [employer]  . Occupation: customer service  Tobacco Use  . Smoking status: Current Some Day Smoker    Packs/day: 0.50    Years: 40.00    Pack years: 20.00    Types: Cigarettes    Last attempt to quit: 10/22/2019    Years since quitting: 1.0  . Smokeless tobacco: Never Used  . Tobacco comment: 1 pack weekly.  started smoking at 60 years old  Vaping Use  . Vaping Use: Never used  Substance and Sexual Activity  . Alcohol use: Yes    Comment: ocassional  . Drug use: No  . Sexual activity: Not Currently  Other Topics Concern  . Not on file  Social History Narrative   Daily caffeine use 3   Patient does not get regular exercise   Social Determinants of Health   Financial Resource Strain: Not on file  Food Insecurity: Not on file  Transportation Needs: Not on file  Physical Activity: Not on file  Stress: Not on file  Social Connections: Not on file  Intimate Partner Violence: Not on file    FAMILY HISTORY: Family History  Problem Relation Age of Onset  . Allergies Sister   . Diabetes Sister   . Hypertension Sister   . Hypertension Mother   . Prostate cancer Father   . Hypertension Brother   . Hypertension Sister   . Diabetes Paternal Aunt   . Cancer Paternal Aunt        type unknown  . Prostate cancer Brother   . Stroke Maternal Aunt   . Hypertension Maternal Aunt   . Colon cancer Neg Hx   . Esophageal cancer Neg Hx   . Rectal cancer Neg Hx   . Stomach cancer Neg Hx     ALLERGIES:  is allergic to shellfish allergy.  MEDICATIONS:  Current Outpatient Medications  Medication Sig Dispense Refill  . ALPRAZolam (XANAX) 0.5 MG tablet Take 1 tablet (0.5 mg total) by mouth 2 (two) times daily as needed for anxiety or sleep. 60 tablet 2  . buPROPion (WELLBUTRIN XL) 300 MG 24 hr tablet TAKE 1 TABLET(300 MG) BY MOUTH DAILY 90 tablet 1  . cholecalciferol (VITAMIN D3) 25 MCG (1000 UNIT) tablet Take 1,000 Units by mouth daily.     [name] Kitchen escitalopram (LEXAPRO) 20 MG tablet Take 20 mg by mouth daily. (Patient not taking: Reported on 10/19/2020)    . hydrochlorothiazide (HYDRODIURIL) 12.5 MG tablet TAKE 1 TABLET(12.5 MG) BY MOUTH DAILY 90 tablet 0  . hydrocortisone 2.5 % cream Apply topically 2 (two) times daily. 30 g 1  . iron polysaccharides (NU-IRON) 150 MG capsule Take 1 capsule (150 mg total) by mouth daily. 90 capsule 1  . loratadine (CLARITIN) 10 MG tablet Take 10 mg by mouth daily as needed for allergies.    [name] Kitchen oxyCODONE-acetaminophen (PERCOCET/ROXICET) 5-325 MG tablet Take 1 tablet by mouth every 6 (six) hours as needed for severe pain. 30 tablet 0  . Turmeric 500 MG CAPS Take 500 mg by mouth daily.    . vitamin B-12 (CYANOCOBALAMIN) 100 MCG tablet Take 100 mcg by mouth daily.    [name] Kitchen VITAMIN D-VITAMIN K PO Take 1 tablet by mouth daily.    [name] Kitchen  zolpidem (AMBIEN) 10 MG tablet Take 1 tablet (10 mg total) by mouth at bedtime as needed for up to 30 days for sleep. 30 tablet 5   No current facility-administered medications for this visit.    REVIEW OF SYSTEMS:   Constitutional: ( - ) fevers, ( - )  chills , ( - ) night sweats Eyes: ( - ) blurriness of vision, ( - ) double vision, ( - ) watery eyes Ears, nose, mouth, throat, and face: ( - ) mucositis, ( - ) sore throat Respiratory: ( - ) cough, ( - ) dyspnea, ( - ) wheezes Cardiovascular: ( - ) palpitation, ( - ) chest discomfort, ( - ) lower extremity swelling Gastrointestinal:  ( - ) nausea, ( - ) heartburn, ( - ) change in bowel habits Skin: ( - ) abnormal skin rashes Lymphatics: ( - ) new lymphadenopathy, ( - ) easy bruising Neurological: ( - ) numbness, ( - ) tingling, ( - ) new weaknesses Behavioral/Psych: ( - ) mood change, ( - ) new changes  All other systems were reviewed with the patient and are negative.  PHYSICAL EXAMINATION: ECOG PERFORMANCE STATUS: 0 - Asymptomatic  Vitals:   10/26/20 1533  BP: (!) 145/102  Pulse: 99  Resp: 17  Temp: 98.3 F (36.8 C)   SpO2: 99%   Filed Weights   10/26/20 1533  Weight: 193 lb 11.2 oz (87.9 kg)    GENERAL: well appearing middle aged [place] American female in NAD  SKIN: skin color, texture, turgor are normal, no rashes or significant lesions EYES: conjunctiva are pink and non-injected, sclera clear LUNGS: clear to auscultation and percussion with normal breathing effort HEART: regular rate & rhythm and no murmurs and no lower extremity edema Musculoskeletal: no cyanosis of digits and no clubbing  PSYCH: alert & oriented x 3, fluent speech NEURO: no focal motor/sensory deficits  LABORATORY DATA:  I have reviewed the data as listed CBC Latest Ref Rng & Units 10/02/2020 09/21/2020 09/04/2020  WBC 4.0 - 10.5 K/uL 10.2 9.0 8.4  Hemoglobin 12.0 - 15.0 g/dL 13.0 13.3 12.8  Hematocrit 36.0 - 46.0 % 39.9 40.8 40.1  Platelets 150 - 400 K/uL 286 293.0 270    CMP Latest Ref Rng & Units 10/02/2020 09/21/2020 09/04/2020  Glucose 70 - 99 mg/dL 130(H) 53(L) 155(H)  BUN 6 - 20 mg/dL 11 16 9   Creatinine 0.44 - 1.00 mg/dL 0.81 0.93 0.79  Sodium 135 - 145 mmol/L 141 139 142  Potassium 3.5 - 5.1 mmol/L 3.8 3.9 4.1  Chloride 98 - 111 mmol/L 102 101 104  CO2 22 - 32 mmol/L 27 31 28   Calcium 8.9 - 10.3 mg/dL 9.3 10.0 9.3  Total Protein 6.5 - 8.1 g/dL 7.5 7.8 7.4  Total Bilirubin 0.3 - 1.2 mg/dL 0.5 0.4 0.5  Alkaline Phos 38 - 126 U/L 107 88 108  AST 15 - 41 U/L 14(L) 16 19  ALT 0 - 44 U/L 17 15 23     RADIOGRAPHIC STUDIES: I have personally reviewed the radiological images as listed and agreed with the findings in the report: increased FDG intake in several areas of the spine and inguinal lymph nodes.  NM PET (CU-64 DETECTNET)SKULL TO MID THIGH  Result Date: 10/15/2020 CLINICAL DATA:  Subsequent treatment strategy for neuroendocrine tumor. Colorectal resection 2006. EXAM: NUCLEAR MEDICINE PET SKULL BASE TO THIGH TECHNIQUE: 3.9 mCi copper 29 DOTATATE was injected intravenously. Full-ring PET imaging was performed from  the skull base to thigh after  the radiotracer. CT data was obtained and used for attenuation correction and anatomic localization. COMPARISON:  DOTATATE PET scan 04/06/2020 FINDINGS: NECK No radiotracer activity in neck lymph nodes. Incidental CT findings: None CHEST No radiotracer accumulation within mediastinal or hilar lymph nodes. No suspicious pulmonary nodules on the CT scan. Incidental CT finding:None ABDOMEN/PELVIS No abnormal radiotracer in the liver. No activity identified within the rectum. Soft tissue nodule in the presacral space measures 12 mm compared to 11 mm. Lesion remains intensely radiotracer avid with SUV max equal 30.1 compared SUV max equal 25.6. Physiologic activity noted in the liver, spleen, adrenal glands and kidneys. Incidental CT findings:None SKELETON Again demonstrated innumerable metastatic lesions throughout the entire skeleton involving the pelvis, spine, ribs, shoulder girdles. Lesions are intensely radiotracer avid. For example lesion in central L5 vertebral body with SUV max equal 39.5 compared SUV max equal 37.5. Lesion in the posterior RIGHT iliac bone with SUV max equal 37 compared SUV max equal 18. Lesion at L1 with SUV max equal 40 compared SUV max equal 26. On CT portion exam there is smudgy and sclerotic lesions associated with the intense radiotracer activity. Incidental CT findings:None IMPRESSION: 1. Again demonstrated widespread multifocal intense radiotracer avid well differentiated neuroendocrine tumor metastasis within the skeleton. There approximately 75 lesions in a similar pattern to DOTATATE PET scan 04/06/2020. Lesions have increased radiotracer activity compared to prior. On CT imaging lesions are unchanged represented by sclerotic lesions. 2. Stable presacral nodular well differentiated neuroendocrine tumor metastatic lesion. 3. Patient may be a good candidate for peptide receptor radiotherapy (Lu-177 DOTATATE). Consider contacting [facility] [phone]. Electronically Signed   By: Suzy Bouchard M.D.   On: 10/15/2020 12:58    ASSESSMENT & PLAN Shanita Oler 60 y.o. female with medical history significant for metastatic neuroendocrine tumor of the colon who presents for a follow up visit.  After review of the labs, review the pathology, and review the PET CT imaging the findings are most consistent with metastatic neuroendocrine tumor with metastasis to the spine.  The pathology currently appears to be consistent with the neuroendocrine tumor which was previously resected from the patient's rectum.  On exam today Ms. Gerety notes she is tolerating treatment well and ready and able to proceed with continued lanreotide therapy.  Her next dose is 10/30/2020.  Based on the results of her prior imaging I have offered to her the option for Lu 177 Dotatate treatment.  She is willing and able to meet with the nuclear medicine physicians in order to discuss this treatment.  She is also potentially interested in a second opinion but has not requested me to make the referral yet.  Previously we discussed the nature of metastatic neuroendocrine tumor and the treatment options moving forward.  Fortunately this is a slow-growing tumor and observation would be an appropriate treatment option.  I noted that we could do serial imaging to monitor the progression of the tumor and determine if treatment was required.  If the patient wanted to proceed with treatment we could consider lanreotide or octreotide subcu q. 28 days for control of the disease.  I noted that the side effects that could be expected would be nausea, vomiting, diarrhea, abdominal pain, and injection site reactions.  The patient noted that she would prefer to proceed with treatment.  We will plan to continue lanreotide 120 mg subcu q. 28 days with interval imaging approximately 3 months from the start of treatment.  The patient voiced her  understanding of this plan and was eager to  move forward.  # Metastatic Neuroendocrine Tumor, Metastasis to the Spine  --previously discussed with patient that the options would include observation with serial imaging or starting lanreotide therapy. The patient opted to start treatment --will plan for continued lanreotide 116m subq q28 days. Started therapy on 12/27/2019. This will be continued until progression or intolerance to therapy. Next dose due on 10/30/2020 (Friday)  --Due to the extensive involvement of her skeleton and increased activity on her last nuclear medicine scan it was recommended that she be considered for Lu 177 dotatate treatment.  I do believe this is a reasonable option for her.  We have made the referral to nuclear medicine today. --PET CT scan to be performed in July 2022 ( assuming patient does not wan to pursue the Lu 177 dotatate treatment) --Patient has expressed interest in a second opinion.  [facility]  She would like to hold on this for referral for now however this can be made anytime based on her request. --RTC in 3 month for interval visit.    No orders of the defined types were placed in this encounter.  All questions were answered. The patient knows to call the clinic with any problems, questions or concerns.  A total of more than 30 minutes were spent on this encounter and over half of that time was spent on counseling and coordination of care as outlined above.   JLedell Peoples MD Department of Hematology/Oncology [facility] CenterPhone: [phone]: [phone]: [email or web address]  10/27/2020 3:36 PM

## 2020-10-28 ENCOUNTER — Other Ambulatory Visit: Payer: Self-pay | Admitting: *Deleted

## 2020-10-28 DIAGNOSIS — C7B8 Other secondary neuroendocrine tumors: Secondary | ICD-10-CM

## 2020-10-29 ENCOUNTER — Telehealth: Payer: Self-pay | Admitting: *Deleted

## 2020-10-29 NOTE — Telephone Encounter (Signed)
TCT patient regarding an appointment for a second opinion for her neuroendocrine cancer. Advised that an appointment has been made for 11/17/20 @ 2pm with Dr. Leslie Andrea @ [facility] in [place], [place]. Advised that they will email details of her appt and directions. She confirmed her email.  Patient very pleased with how fast she can get in to see Dr. Leamon Arnt.  Dr. Lorenso Courier made aware.

## 2020-10-30 ENCOUNTER — Inpatient Hospital Stay: Payer: 59

## 2020-10-30 ENCOUNTER — Other Ambulatory Visit: Payer: Self-pay

## 2020-10-30 VITALS — BP 133/94 | HR 93 | Resp 18

## 2020-10-30 DIAGNOSIS — C7B03 Secondary carcinoid tumors of bone: Secondary | ICD-10-CM | POA: Diagnosis not present

## 2020-10-30 DIAGNOSIS — C7A025 Malignant carcinoid tumor of the sigmoid colon: Secondary | ICD-10-CM | POA: Diagnosis not present

## 2020-10-30 DIAGNOSIS — C7B8 Other secondary neuroendocrine tumors: Secondary | ICD-10-CM

## 2020-10-30 LAB — CBC WITH DIFFERENTIAL (CANCER CENTER ONLY)
Abs Immature Granulocytes: 0.05 10*3/uL (ref 0.00–0.07)
Basophils Absolute: 0.1 10*3/uL (ref 0.0–0.1)
Basophils Relative: 1 %
Eosinophils Absolute: 0.1 10*3/uL (ref 0.0–0.5)
Eosinophils Relative: 1 %
HCT: 40.7 % (ref 36.0–46.0)
Hemoglobin: 13 g/dL (ref 12.0–15.0)
Immature Granulocytes: 1 %
Lymphocytes Relative: 27 %
Lymphs Abs: 2.9 10*3/uL (ref 0.7–4.0)
MCH: 27.4 pg (ref 26.0–34.0)
MCHC: 31.9 g/dL (ref 30.0–36.0)
MCV: 85.7 fL (ref 80.0–100.0)
Monocytes Absolute: 0.9 10*3/uL (ref 0.1–1.0)
Monocytes Relative: 9 %
Neutro Abs: 6.5 10*3/uL (ref 1.7–7.7)
Neutrophils Relative %: 61 %
Platelet Count: 302 10*3/uL (ref 150–400)
RBC: 4.75 MIL/uL (ref 3.87–5.11)
RDW: 14.6 % (ref 11.5–15.5)
WBC Count: 10.6 10*3/uL — ABNORMAL HIGH (ref 4.0–10.5)
nRBC: 0 % (ref 0.0–0.2)

## 2020-10-30 LAB — CMP (CANCER CENTER ONLY)
ALT: 23 U/L (ref 0–44)
AST: 21 U/L (ref 15–41)
Albumin: 4 g/dL (ref 3.5–5.0)
Alkaline Phosphatase: 130 U/L — ABNORMAL HIGH (ref 38–126)
Anion gap: 10 (ref 5–15)
BUN: 18 mg/dL (ref 6–20)
CO2: 29 mmol/L (ref 22–32)
Calcium: 9.9 mg/dL (ref 8.9–10.3)
Chloride: 101 mmol/L (ref 98–111)
Creatinine: 0.86 mg/dL (ref 0.44–1.00)
GFR, Estimated: >60 mL/min (ref >60)
Glucose, Bld: 89 mg/dL (ref 70–99)
Potassium: 3.7 mmol/L (ref 3.5–5.1)
Sodium: 140 mmol/L (ref 135–145)
Total Bilirubin: 0.4 mg/dL (ref 0.3–1.2)
Total Protein: 7.8 g/dL (ref 6.5–8.1)

## 2020-10-30 MED ORDER — LANREOTIDE ACETATE 120 MG/0.5ML ~~LOC~~ SOLN
120.0000 mg | Freq: Once | SUBCUTANEOUS | Status: AC
  Administered 2020-10-30: 120 mg via SUBCUTANEOUS
  Filled 2020-10-30: qty 120

## 2020-10-30 NOTE — Patient Instructions (Signed)
Lanreotide injection What is this medicine? LANREOTIDE (lan REE oh tide) is used to reduce blood levels of growth hormone in patients with a condition called acromegaly. It also works to slow or stop tumor growth in patients with neuroendocrine tumors and treat carcinoid syndrome. This medicine may be used for other purposes; ask your health care provider or pharmacist if you have questions. COMMON BRAND NAME(S): Somatuline Depot What should I tell my health care provider before I take this medicine? They need to know if you have any of these conditions:  diabetes  gallbladder disease  heart disease  kidney disease  liver disease  thyroid disease  an unusual or allergic reaction to lanreotide, other medicines, foods, dyes, or preservatives  pregnant or trying to get pregnant  breast-feeding How should I use this medicine? This medicine is for injection under the skin. It is given by a health care professional in a hospital or clinic setting. Contact your pediatrician or health care professional regarding the use of this medicine in children. Special care may be needed. Overdosage: If you think you have taken too much of this medicine contact a poison control center or emergency room at once. NOTE: This medicine is only for you. Do not share this medicine with others. What if I miss a dose? It is important not to miss your dose. Call your doctor or health care professional if you are unable to keep an appointment. What may interact with this medicine? This medicine may interact with the following medications:  bromocriptine  cyclosporine  certain medicines for blood pressure, heart disease, irregular heart beat  certain medicines for diabetes  quinidine  terfenadine This list may not describe all possible interactions. Give your health care provider a list of all the medicines, herbs, non-prescription drugs, or dietary supplements you use. Also tell them if you smoke,  drink alcohol, or use illegal drugs. Some items may interact with your medicine. What should I watch for while using this medicine? Tell your doctor or healthcare professional if your symptoms do not start to get better or if they get worse. Visit your doctor or health care professional for regular checks on your progress. Your condition will be monitored carefully while you are receiving this medicine. This medicine may increase blood sugar. Ask your healthcare provider if changes in diet or medicines are needed if you have diabetes. You may need blood work done while you are taking this medicine. Women should inform their doctor if they wish to become pregnant or think they might be pregnant. There is a potential for serious side effects to an unborn child. Talk to your health care professional or pharmacist for more information. Do not breast-feed an infant while taking this medicine or for 6 months after stopping it. This medicine has caused ovarian failure in some women. This medicine may interfere with the ability to have a child. Talk with your doctor or health care professional if you are concerned about your fertility. What side effects may I notice from receiving this medicine? Side effects that you should report to your doctor or health care professional as soon as possible:  allergic reactions like skin rash, itching or hives, swelling of the face, lips, or tongue  increased blood pressure  severe stomach pain  signs and symptoms of hgh blood sugar such as being more thirsty or hungry or having to urinate more than normal. You may also feel very tired or have blurry vision.  signs and symptoms of low blood   sugar such as feeling anxious; confusion; dizziness; increased hunger; unusually weak or tired; sweating; shakiness; cold; irritable; headache; blurred vision; fast heartbeat; loss of consciousness  unusually slow heartbeat Side effects that usually do not require medical  attention (report to your doctor or health care professional if they continue or are bothersome):  constipation  diarrhea  dizziness  headache  muscle pain  muscle spasms  nausea  pain, redness, or irritation at site where injected This list may not describe all possible side effects. Call your doctor for medical advice about side effects. You may report side effects to FDA at 1-800-FDA-1088. Where should I keep my medicine? This drug is given in a hospital or clinic and will not be stored at home. NOTE: This sheet is a summary. It may not cover all possible information. If you have questions about this medicine, talk to your doctor, pharmacist, or health care provider.  2021 Elsevier/Gold Standard (2018-03-29 09:13:08)  

## 2020-11-03 LAB — CHROMOGRANIN A: Chromogranin A (ng/mL): 19.4 ng/mL (ref 0.0–101.8)

## 2020-11-11 ENCOUNTER — Encounter: Payer: Self-pay | Admitting: Nurse Practitioner

## 2020-11-15 ENCOUNTER — Other Ambulatory Visit: Payer: Self-pay | Admitting: Internal Medicine

## 2020-11-15 NOTE — Telephone Encounter (Signed)
Please refill as per office routine med refill policy (all routine meds refilled for 3 mo or monthly per pt preference up to one year from last visit, then month to month grace period for 3 mo, then further med refills will have to be denied)  

## 2020-11-26 ENCOUNTER — Other Ambulatory Visit: Payer: Self-pay | Admitting: *Deleted

## 2020-11-26 DIAGNOSIS — C7B8 Other secondary neuroendocrine tumors: Secondary | ICD-10-CM

## 2020-11-27 ENCOUNTER — Inpatient Hospital Stay: Payer: 59 | Attending: Hematology and Oncology

## 2020-11-27 ENCOUNTER — Other Ambulatory Visit: Payer: Self-pay

## 2020-11-27 ENCOUNTER — Inpatient Hospital Stay: Payer: 59

## 2020-11-27 VITALS — BP 144/97 | HR 90 | Temp 98.5°F | Resp 18

## 2020-11-27 DIAGNOSIS — C7B8 Other secondary neuroendocrine tumors: Secondary | ICD-10-CM

## 2020-11-27 DIAGNOSIS — Z79899 Other long term (current) drug therapy: Secondary | ICD-10-CM | POA: Insufficient documentation

## 2020-11-27 DIAGNOSIS — C7A025 Malignant carcinoid tumor of the sigmoid colon: Secondary | ICD-10-CM | POA: Insufficient documentation

## 2020-11-27 LAB — CBC WITH DIFFERENTIAL (CANCER CENTER ONLY)
Abs Immature Granulocytes: 0.02 10*3/uL (ref 0.00–0.07)
Basophils Absolute: 0 10*3/uL (ref 0.0–0.1)
Basophils Relative: 0 %
Eosinophils Absolute: 0.2 10*3/uL (ref 0.0–0.5)
Eosinophils Relative: 2 %
HCT: 36.5 % (ref 36.0–46.0)
Hemoglobin: 12 g/dL (ref 12.0–15.0)
Immature Granulocytes: 0 %
Lymphocytes Relative: 45 %
Lymphs Abs: 4.2 10*3/uL — ABNORMAL HIGH (ref 0.7–4.0)
MCH: 27.5 pg (ref 26.0–34.0)
MCHC: 32.9 g/dL (ref 30.0–36.0)
MCV: 83.7 fL (ref 80.0–100.0)
Monocytes Absolute: 1 10*3/uL (ref 0.1–1.0)
Monocytes Relative: 10 %
Neutro Abs: 4.1 10*3/uL (ref 1.7–7.7)
Neutrophils Relative %: 43 %
Platelet Count: 274 10*3/uL (ref 150–400)
RBC: 4.36 MIL/uL (ref 3.87–5.11)
RDW: 14.6 % (ref 11.5–15.5)
WBC Count: 9.5 10*3/uL (ref 4.0–10.5)
nRBC: 0 % (ref 0.0–0.2)

## 2020-11-27 LAB — CMP (CANCER CENTER ONLY)
ALT: 13 U/L (ref 0–44)
AST: 14 U/L — ABNORMAL LOW (ref 15–41)
Albumin: 3.5 g/dL (ref 3.5–5.0)
Alkaline Phosphatase: 108 U/L (ref 38–126)
Anion gap: 11 (ref 5–15)
BUN: 12 mg/dL (ref 6–20)
CO2: 26 mmol/L (ref 22–32)
Calcium: 9.4 mg/dL (ref 8.9–10.3)
Chloride: 104 mmol/L (ref 98–111)
Creatinine: 0.86 mg/dL (ref 0.44–1.00)
GFR, Estimated: >60 mL/min (ref >60)
Glucose, Bld: 145 mg/dL — ABNORMAL HIGH (ref 70–99)
Potassium: 3.7 mmol/L (ref 3.5–5.1)
Sodium: 141 mmol/L (ref 135–145)
Total Bilirubin: <0.2 mg/dL — ABNORMAL LOW (ref 0.3–1.2)
Total Protein: 6.9 g/dL (ref 6.5–8.1)

## 2020-11-27 MED ORDER — LANREOTIDE ACETATE 120 MG/0.5ML ~~LOC~~ SOLN
120.0000 mg | Freq: Once | SUBCUTANEOUS | Status: AC
  Administered 2020-11-27: 120 mg via SUBCUTANEOUS
  Filled 2020-11-27: qty 120

## 2020-11-27 NOTE — Patient Instructions (Signed)
Lanreotide injection What is this medicine? LANREOTIDE (lan REE oh tide) is used to reduce blood levels of growth hormone in patients with a condition called acromegaly. It also works to slow or stop tumor growth in patients with neuroendocrine tumors and treat carcinoid syndrome. This medicine may be used for other purposes; ask your health care provider or pharmacist if you have questions. COMMON BRAND NAME(S): Somatuline Depot What should I tell my health care provider before I take this medicine? They need to know if you have any of these conditions:  diabetes  gallbladder disease  heart disease  kidney disease  liver disease  thyroid disease  an unusual or allergic reaction to lanreotide, other medicines, foods, dyes, or preservatives  pregnant or trying to get pregnant  breast-feeding How should I use this medicine? This medicine is for injection under the skin. It is given by a health care professional in a hospital or clinic setting. Contact your pediatrician or health care professional regarding the use of this medicine in children. Special care may be needed. Overdosage: If you think you have taken too much of this medicine contact a poison control center or emergency room at once. NOTE: This medicine is only for you. Do not share this medicine with others. What if I miss a dose? It is important not to miss your dose. Call your doctor or health care professional if you are unable to keep an appointment. What may interact with this medicine? This medicine may interact with the following medications:  bromocriptine  cyclosporine  certain medicines for blood pressure, heart disease, irregular heart beat  certain medicines for diabetes  quinidine  terfenadine This list may not describe all possible interactions. Give your health care provider a list of all the medicines, herbs, non-prescription drugs, or dietary supplements you use. Also tell them if you smoke,  drink alcohol, or use illegal drugs. Some items may interact with your medicine. What should I watch for while using this medicine? Tell your doctor or healthcare professional if your symptoms do not start to get better or if they get worse. Visit your doctor or health care professional for regular checks on your progress. Your condition will be monitored carefully while you are receiving this medicine. This medicine may increase blood sugar. Ask your healthcare provider if changes in diet or medicines are needed if you have diabetes. You may need blood work done while you are taking this medicine. Women should inform their doctor if they wish to become pregnant or think they might be pregnant. There is a potential for serious side effects to an unborn child. Talk to your health care professional or pharmacist for more information. Do not breast-feed an infant while taking this medicine or for 6 months after stopping it. This medicine has caused ovarian failure in some women. This medicine may interfere with the ability to have a child. Talk with your doctor or health care professional if you are concerned about your fertility. What side effects may I notice from receiving this medicine? Side effects that you should report to your doctor or health care professional as soon as possible:  allergic reactions like skin rash, itching or hives, swelling of the face, lips, or tongue  increased blood pressure  severe stomach pain  signs and symptoms of hgh blood sugar such as being more thirsty or hungry or having to urinate more than normal. You may also feel very tired or have blurry vision.  signs and symptoms of low blood   sugar such as feeling anxious; confusion; dizziness; increased hunger; unusually weak or tired; sweating; shakiness; cold; irritable; headache; blurred vision; fast heartbeat; loss of consciousness  unusually slow heartbeat Side effects that usually do not require medical  attention (report to your doctor or health care professional if they continue or are bothersome):  constipation  diarrhea  dizziness  headache  muscle pain  muscle spasms  nausea  pain, redness, or irritation at site where injected This list may not describe all possible side effects. Call your doctor for medical advice about side effects. You may report side effects to FDA at 1-800-FDA-1088. Where should I keep my medicine? This drug is given in a hospital or clinic and will not be stored at home. NOTE: This sheet is a summary. It may not cover all possible information. If you have questions about this medicine, talk to your doctor, pharmacist, or health care provider.  2021 Elsevier/Gold Standard (2018-03-29 09:13:08)  

## 2020-12-02 LAB — CHROMOGRANIN A: Chromogranin A (ng/mL): 21.1 ng/mL (ref 0.0–101.8)

## 2020-12-24 ENCOUNTER — Other Ambulatory Visit: Payer: Self-pay | Admitting: Hematology and Oncology

## 2020-12-24 DIAGNOSIS — C7B8 Other secondary neuroendocrine tumors: Secondary | ICD-10-CM

## 2020-12-25 ENCOUNTER — Inpatient Hospital Stay: Payer: 59

## 2020-12-25 ENCOUNTER — Other Ambulatory Visit: Payer: Self-pay

## 2020-12-25 ENCOUNTER — Inpatient Hospital Stay: Payer: 59 | Attending: Hematology and Oncology

## 2020-12-25 ENCOUNTER — Inpatient Hospital Stay (HOSPITAL_BASED_OUTPATIENT_CLINIC_OR_DEPARTMENT_OTHER): Payer: 59 | Admitting: Hematology and Oncology

## 2020-12-25 VITALS — BP 141/96 | HR 89 | Temp 97.8°F | Resp 17 | Ht 63.0 in | Wt 189.6 lb

## 2020-12-25 DIAGNOSIS — C7B8 Other secondary neuroendocrine tumors: Secondary | ICD-10-CM

## 2020-12-25 DIAGNOSIS — C7A8 Other malignant neuroendocrine tumors: Secondary | ICD-10-CM

## 2020-12-25 DIAGNOSIS — M899 Disorder of bone, unspecified: Secondary | ICD-10-CM

## 2020-12-25 DIAGNOSIS — Z79899 Other long term (current) drug therapy: Secondary | ICD-10-CM | POA: Diagnosis not present

## 2020-12-25 DIAGNOSIS — C7A025 Malignant carcinoid tumor of the sigmoid colon: Secondary | ICD-10-CM | POA: Insufficient documentation

## 2020-12-25 LAB — CMP (CANCER CENTER ONLY)
ALT: 11 U/L (ref 0–44)
AST: 15 U/L (ref 15–41)
Albumin: 3.5 g/dL (ref 3.5–5.0)
Alkaline Phosphatase: 110 U/L (ref 38–126)
Anion gap: 11 (ref 5–15)
BUN: 13 mg/dL (ref 6–20)
CO2: 25 mmol/L (ref 22–32)
Calcium: 9.7 mg/dL (ref 8.9–10.3)
Chloride: 103 mmol/L (ref 98–111)
Creatinine: 0.84 mg/dL (ref 0.44–1.00)
GFR, Estimated: >60 mL/min (ref >60)
Glucose, Bld: 125 mg/dL — ABNORMAL HIGH (ref 70–99)
Potassium: 3.8 mmol/L (ref 3.5–5.1)
Sodium: 139 mmol/L (ref 135–145)
Total Bilirubin: 0.5 mg/dL (ref 0.3–1.2)
Total Protein: 7.6 g/dL (ref 6.5–8.1)

## 2020-12-25 LAB — CBC WITH DIFFERENTIAL (CANCER CENTER ONLY)
Abs Immature Granulocytes: 0.02 10*3/uL (ref 0.00–0.07)
Basophils Absolute: 0 10*3/uL (ref 0.0–0.1)
Basophils Relative: 0 %
Eosinophils Absolute: 0.2 10*3/uL (ref 0.0–0.5)
Eosinophils Relative: 2 %
HCT: 39.5 % (ref 36.0–46.0)
Hemoglobin: 12.9 g/dL (ref 12.0–15.0)
Immature Granulocytes: 0 %
Lymphocytes Relative: 42 %
Lymphs Abs: 3.1 10*3/uL (ref 0.7–4.0)
MCH: 27.4 pg (ref 26.0–34.0)
MCHC: 32.7 g/dL (ref 30.0–36.0)
MCV: 84 fL (ref 80.0–100.0)
Monocytes Absolute: 0.7 10*3/uL (ref 0.1–1.0)
Monocytes Relative: 10 %
Neutro Abs: 3.4 10*3/uL (ref 1.7–7.7)
Neutrophils Relative %: 46 %
Platelet Count: 317 10*3/uL (ref 150–400)
RBC: 4.7 MIL/uL (ref 3.87–5.11)
RDW: 13.3 % (ref 11.5–15.5)
WBC Count: 7.5 10*3/uL (ref 4.0–10.5)
nRBC: 0 % (ref 0.0–0.2)

## 2020-12-25 MED ORDER — LANREOTIDE ACETATE 120 MG/0.5ML ~~LOC~~ SOLN
120.0000 mg | Freq: Once | SUBCUTANEOUS | Status: AC
  Administered 2020-12-25: 120 mg via SUBCUTANEOUS
  Filled 2020-12-25: qty 120

## 2020-12-28 LAB — CHROMOGRANIN A: Chromogranin A (ng/mL): 19.9 ng/mL (ref 0.0–101.8)

## 2021-01-03 ENCOUNTER — Encounter: Payer: Self-pay | Admitting: Hematology and Oncology

## 2021-01-03 NOTE — Progress Notes (Signed)
[place] [phone]   [phone]  PROGRESS NOTE  Patient Care Team: Biagio Borg, MD as PCP - General  Hematological/Oncological History # Metastatic Neuroendocrine Tumor, Metastasis to the Spine 1) 08/26/2019: CT Abdomen Pelvis performed due to RUQ abdominal pain. Imaging showed a soft tissue mass abutting the right posterior eighth rib 2) 09/06/2019: CT chest performed which showed Ill-defined soft tissue nodule along the medial right posterior thoracic wall at the 8th intercostal space to the right of the spine. MRI was recommended. 3) 10/19/2019: MRI Thoracic spine showed enhancing lesions throughout the visualized spine consistent with metastatic disease. Additionally there was a 1.5 cm soft tissue nodule in the posteromedial right eighth intercostal space, more concerning for a metastasis 4) 10/25/2019: establish care with Dr. Lorenso Courier   5) 11/05/2019: attempted biopsy of soft tissue mass at right posterior 8th rib, but lesion had dissipated at time of biopsy attempt 6) 11/27/2019: PET CT scan performed showing right inguinal lymph nodes and osseous lesions, indicative of metastatic disease of unknown primary. 7) 12/11/2019: CT bone marrow biopsy showed metastatic neuroendocrine neoplasm 8) 12/27/2019: start of lanreotide 163m sub q28 days    #Low Grade Neuroendocrine Tumor 1) 2006: reportedly had rectal carcinoid tumor removed 2) 08/14/2008: patient had a flexibile sigmoidoscopy with endoscopic ultrasound which resected an 875m subepithelial lesion in rectum. Findings consistent with low grade neuroendocrine tumor. 3) 04/08/2010: repeat colonoscopy, no residual tumor. Repeat recommended in 2016.  4) 12/11/2019: metastatic recurrence found on biopsy, noted above.  5) 12/27/2019: start of lanreotide 12021mub q28 days  6) 10/15/2020: NM PET (CU-64) scan showed widespread osseous and lymphatic uptake consistent with metastatic spread of neuroendocrine tumor.  This  is more avid than prior.  Interval History:  Michelle Solomon 60o. female with medical history significant for metastatic neuroendocrine tumor of the colon who presents for a follow up visit. The patient's last visit was on 10/26/2020 to assure continued tolerance of lanreotide therapy. In the interim since the last visit the patient has had a second opinion visit [facility] On exam today Michelle Solomon accompanied by her daughter.  She reports that she has been well in the interim since her last visit.  She does not have any new symptoms.  She is not having any bone pain, back pain, weight loss, fevers, chills, sweats.  She reports she is tolerating the shots well.  After discussion with Dr. MorLeamon Arnt she is okay with continuing at the current dose of lanreotide with consideration of increasing the frequency of dosing in the event her chromogranin were to increase or her neck scan were to show progression.  She notes that she is tolerating the shots well without any nausea, vomiting, or diarrhea.  She does not have any other questions or concerns today.  A full 10 point ROS is listed below.  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   [facility]   CONJUNCTIVITIS, ALLERGIC 11/27/2008   HEMORRHOIDS 06/16/2008   HTN (hypertension)    Hx of adenomatous colonic polyps 11/2019   NIPPLE DISCHARGE 01/21/2010   Other specified forms  of hearing loss 10/08/2009   Overweight(278.02) 05/29/2007   RASH-NONVESICULAR 05/29/2007   Wheezing 06/22/2010    SURGICAL HISTORY: Past Surgical History:  Procedure Laterality Date   COLONOSCOPY      SOCIAL HISTORY: Social History   Socioeconomic  History   Marital status: Married    Spouse name: Not on file   Number of children: 3   Years of education: Not on file   Highest education level: Not on file  Occupational History   Occupation: CARD REPLACEMEN    Employer: [employer]   Occupation: customer service  Tobacco Use   Smoking status: Some Days    Packs/day: 0.50    Years: 40.00    Pack years: 20.00    Types: Cigarettes    Last attempt to quit: 10/22/2019    Years since quitting: 1.2   Smokeless tobacco: Never   Tobacco comments:    1 pack weekly.  started smoking at 60 years old  Vaping Use   Vaping Use: Never used  Substance and Sexual Activity   Alcohol use: Yes    Comment: ocassional   Drug use: No   Sexual activity: Not Currently  Other Topics Concern   Not on file  Social History Narrative   Daily caffeine use 3   Patient does not get regular exercise   Social Determinants of Health   Financial Resource Strain: Not on file  Food Insecurity: Not on file  Transportation Needs: Not on file  Physical Activity: Not on file  Stress: Not on file  Social Connections: Not on file  Intimate Partner Violence: Not on file    FAMILY HISTORY: Family History  Problem Relation Age of Onset   Allergies Sister    Diabetes Sister    Hypertension Sister    Hypertension Mother    Prostate cancer Father    Hypertension Brother    Hypertension Sister    Diabetes Paternal Aunt    Cancer Paternal Aunt        type unknown   Prostate cancer Brother    Stroke Maternal Aunt    Hypertension Maternal Aunt    Colon cancer Neg Hx    Esophageal cancer Neg Hx    Rectal cancer Neg Hx    Stomach cancer Neg Hx     ALLERGIES:  is allergic to shellfish allergy.  MEDICATIONS:  Current Outpatient Medications  Medication Sig Dispense Refill   ALPRAZolam (XANAX) 0.5 MG tablet Take 1 tablet (0.5 mg total) by mouth 2 (two) times daily as needed for anxiety or sleep. 60 tablet 2   buPROPion (WELLBUTRIN XL) 300 MG 24 hr  tablet TAKE 1 TABLET(300 MG) BY MOUTH DAILY 90 tablet 1   cholecalciferol (VITAMIN D3) 25 MCG (1000 UNIT) tablet Take 1,000 Units by mouth daily.     escitalopram (LEXAPRO) 20 MG tablet Take 20 mg by mouth daily. (Patient not taking: Reported on 10/19/2020)     hydrochlorothiazide (HYDRODIURIL) 12.5 MG tablet TAKE 1 TABLET(12.5 MG) BY MOUTH DAILY 90 tablet 3   hydrocortisone 2.5 % cream Apply topically 2 (two) times daily. 30 g 1   iron polysaccharides (NU-IRON) 150 MG capsule Take 1 capsule (150 mg total) by mouth daily. 90 capsule 1   loratadine (CLARITIN) 10 MG tablet Take 10 mg by mouth daily as needed for allergies.     oxyCODONE-acetaminophen (PERCOCET/ROXICET) 5-325 MG tablet Take 1 tablet by mouth every 6 (six) hours as needed for severe pain. 30 tablet 0   Turmeric  500 MG CAPS Take 500 mg by mouth daily.     vitamin B-12 (CYANOCOBALAMIN) 100 MCG tablet Take 100 mcg by mouth daily.     VITAMIN D-VITAMIN K PO Take 1 tablet by mouth daily.     zolpidem (AMBIEN) 10 MG tablet Take 1 tablet (10 mg total) by mouth at bedtime as needed for up to 30 days for sleep. 30 tablet 5   No current facility-administered medications for this visit.    REVIEW OF SYSTEMS:   Constitutional: ( - ) fevers, ( - )  chills , ( - ) night sweats Eyes: ( - ) blurriness of vision, ( - ) double vision, ( - ) watery eyes Ears, nose, mouth, throat, and face: ( - ) mucositis, ( - ) sore throat Respiratory: ( - ) cough, ( - ) dyspnea, ( - ) wheezes Cardiovascular: ( - ) palpitation, ( - ) chest discomfort, ( - ) lower extremity swelling Gastrointestinal:  ( - ) nausea, ( - ) heartburn, ( - ) change in bowel habits Skin: ( - ) abnormal skin rashes Lymphatics: ( - ) new lymphadenopathy, ( - ) easy bruising Neurological: ( - ) numbness, ( - ) tingling, ( - ) new weaknesses Behavioral/Psych: ( - ) mood change, ( - ) new changes  All other systems were reviewed with the patient and are negative.  PHYSICAL  EXAMINATION: ECOG PERFORMANCE STATUS: 0 - Asymptomatic  Vitals:   12/25/20 1003  BP: (!) 141/96  Pulse: 89  Resp: 17  Temp: 97.8 F (36.6 C)  SpO2: 100%   Filed Weights   12/25/20 1003  Weight: 189 lb 9.6 oz (86 kg)    GENERAL: well appearing middle aged [place] American female in NAD  SKIN: skin color, texture, turgor are normal, no rashes or significant lesions EYES: conjunctiva are pink and non-injected, sclera clear LUNGS: clear to auscultation and percussion with normal breathing effort HEART: regular rate & rhythm and no murmurs and no lower extremity edema Musculoskeletal: no cyanosis of digits and no clubbing  PSYCH: alert & oriented x 3, fluent speech NEURO: no focal motor/sensory deficits  LABORATORY DATA:  I have reviewed the data as listed CBC Latest Ref Rng & Units 12/25/2020 11/27/2020 10/30/2020  WBC 4.0 - 10.5 K/uL 7.5 9.5 10.6(H)  Hemoglobin 12.0 - 15.0 g/dL 12.9 12.0 13.0  Hematocrit 36.0 - 46.0 % 39.5 36.5 40.7  Platelets 150 - 400 K/uL 317 274 302    CMP Latest Ref Rng & Units 12/25/2020 11/27/2020 10/30/2020  Glucose 70 - 99 mg/dL 125(H) 145(H) 89  BUN 6 - 20 mg/dL 13 12 18   Creatinine 0.44 - 1.00 mg/dL 0.84 0.86 0.86  Sodium 135 - 145 mmol/L 139 141 140  Potassium 3.5 - 5.1 mmol/L 3.8 3.7 3.7  Chloride 98 - 111 mmol/L 103 104 101  CO2 22 - 32 mmol/L 25 26 29   Calcium 8.9 - 10.3 mg/dL 9.7 9.4 9.9  Total Protein 6.5 - 8.1 g/dL 7.6 6.9 7.8  Total Bilirubin 0.3 - 1.2 mg/dL 0.5 <0.2(L) 0.4  Alkaline Phos 38 - 126 U/L 110 108 130(H)  AST 15 - 41 U/L 15 14(L) 21  ALT 0 - 44 U/L 11 13 23     RADIOGRAPHIC STUDIES: I have personally reviewed the radiological images as listed and agreed with the findings in the report: increased FDG intake in several areas of the spine and inguinal lymph nodes.  No results found.   ASSESSMENT & PLAN Michelle Solomon  60 y.o. female with medical history significant for metastatic neuroendocrine tumor of the colon who presents  for a follow up visit.  After review of the labs, review the pathology, and review the PET CT imaging the findings are most consistent with metastatic neuroendocrine tumor with metastasis to the spine.  The pathology currently appears to be consistent with the neuroendocrine tumor which was previously resected from the patient's rectum.  On exam today Ms. Angello notes she is tolerating treatment well and ready and able to proceed with continued lanreotide therapy.  Her next dose is 01/22/2021.  Based on the results of her prior imaging I have offered to her the option for Lu 177 Dotatate treatment.  She is willing and able to meet with the nuclear medicine physicians in order to discuss this treatment.  She is also had a second opinion with Dr. Leamon Arnt at [facility].  Previously we discussed the nature of metastatic neuroendocrine tumor and the treatment options moving forward.  Fortunately this is a slow-growing tumor and observation would be an appropriate treatment option.  I noted that we could do serial imaging to monitor the progression of the tumor and determine if treatment was required.  If the patient wanted to proceed with treatment we could consider lanreotide or octreotide subcu q. 28 days for control of the disease.  I noted that the side effects that could be expected would be nausea, vomiting, diarrhea, abdominal pain, and injection site reactions.  The patient noted that she would prefer to proceed with treatment.  We will plan to continue lanreotide 120 mg subcu q. 28 days with interval imaging approximately 3 months from the start of treatment.  The patient voiced her understanding of this plan and was eager to move forward.  # Metastatic Neuroendocrine Tumor, Metastasis to the Spine  --previously discussed with patient that the options would include observation with serial imaging or starting lanreotide therapy. The patient opted to start treatment --will plan for continued  lanreotide 165m subq q28 days. Started therapy on 12/27/2019. This will be continued until progression or intolerance to therapy. Next dose due on 01/22/2021 (Friday)  --Due to the extensive involvement of her skeleton and increased activity on her last nuclear medicine scan it was recommended that she be considered for Lu 177 dotatate treatment.  I do believe this is a reasonable option for her.  We have made the referral to nuclear medicine. --PET CT scan to be performed in July 2022 ( assuming patient does not wan to pursue the Lu 177 dotatate treatment) --Patient expressed interest in a second opinion.  [facility]  He provided excellent advice regarding options moving forward.  After hearing all the different options the patient noted she would like to continue her current lanreotide shots with consideration of increased frequency if she were to be found to be progressing --RTC in 3 month for interval visit.    No orders of the defined types were placed in this encounter.  All questions were answered. The patient knows to call the clinic with any problems, questions or concerns.  A total of more than 30 minutes were spent on this encounter and over half of that time was spent on counseling and coordination of care as outlined above.   JLedell Peoples MD Department of Hematology/Oncology [facility]Phone: [phone]: [phone]: [email or web address]  01/03/2021 7:50 PM

## 2021-01-05 ENCOUNTER — Telehealth: Payer: Self-pay | Admitting: Hematology and Oncology

## 2021-01-05 NOTE — Telephone Encounter (Signed)
Scheduled appts per 7/3 sch msg. Pt aware.

## 2021-01-07 ENCOUNTER — Encounter (HOSPITAL_COMMUNITY): Payer: Self-pay

## 2021-01-07 ENCOUNTER — Emergency Department (HOSPITAL_COMMUNITY)
Admission: EM | Admit: 2021-01-07 | Discharge: 2021-01-08 | Disposition: A | Discharge status: Home / Self Care | Payer: 59 | Attending: Emergency Medicine | Admitting: Emergency Medicine

## 2021-01-07 ENCOUNTER — Other Ambulatory Visit: Payer: Self-pay

## 2021-01-07 ENCOUNTER — Emergency Department (HOSPITAL_COMMUNITY): Payer: 59

## 2021-01-07 DIAGNOSIS — R0789 Other chest pain: Secondary | ICD-10-CM | POA: Insufficient documentation

## 2021-01-07 DIAGNOSIS — I10 Essential (primary) hypertension: Secondary | ICD-10-CM | POA: Diagnosis not present

## 2021-01-07 DIAGNOSIS — M25512 Pain in left shoulder: Secondary | ICD-10-CM | POA: Insufficient documentation

## 2021-01-07 DIAGNOSIS — Z8579 Personal history of other malignant neoplasms of lymphoid, hematopoietic and related tissues: Secondary | ICD-10-CM | POA: Diagnosis not present

## 2021-01-07 DIAGNOSIS — R000 Tachycardia, unspecified: Secondary | ICD-10-CM | POA: Diagnosis not present

## 2021-01-07 DIAGNOSIS — J45909 Unspecified asthma, uncomplicated: Secondary | ICD-10-CM | POA: Diagnosis not present

## 2021-01-07 DIAGNOSIS — F17210 Nicotine dependence, cigarettes, uncomplicated: Secondary | ICD-10-CM | POA: Insufficient documentation

## 2021-01-07 DIAGNOSIS — R079 Chest pain, unspecified: Secondary | ICD-10-CM

## 2021-01-07 DIAGNOSIS — Z79899 Other long term (current) drug therapy: Secondary | ICD-10-CM | POA: Diagnosis not present

## 2021-01-07 DIAGNOSIS — Z85048 Personal history of other malignant neoplasm of rectum, rectosigmoid junction, and anus: Secondary | ICD-10-CM | POA: Insufficient documentation

## 2021-01-07 NOTE — ED Triage Notes (Signed)
Pt c/o left posterior shoulder pain and intermittent epigastric pain for a few days. Pt checked bp at home and noticed it was 170/110.

## 2021-01-08 ENCOUNTER — Emergency Department (HOSPITAL_COMMUNITY): Payer: 59

## 2021-01-08 LAB — BASIC METABOLIC PANEL
Anion gap: 14 (ref 5–15)
BUN: 10 mg/dL (ref 6–20)
CO2: 25 mmol/L (ref 22–32)
Calcium: 10 mg/dL (ref 8.9–10.3)
Chloride: 98 mmol/L (ref 98–111)
Creatinine, Ser: 0.88 mg/dL (ref 0.44–1.00)
GFR, Estimated: >60 mL/min (ref >60)
Glucose, Bld: 145 mg/dL — ABNORMAL HIGH (ref 70–99)
Potassium: 4.1 mmol/L (ref 3.5–5.1)
Sodium: 137 mmol/L (ref 135–145)

## 2021-01-08 LAB — CBC
HCT: 47.8 % — ABNORMAL HIGH (ref 36.0–46.0)
Hemoglobin: 14.4 g/dL (ref 12.0–15.0)
MCH: 27.2 pg (ref 26.0–34.0)
MCHC: 30.1 g/dL (ref 30.0–36.0)
MCV: 90.4 fL (ref 80.0–100.0)
Platelets: 281 10*3/uL (ref 150–400)
RBC: 5.29 MIL/uL — ABNORMAL HIGH (ref 3.87–5.11)
RDW: 14 % (ref 11.5–15.5)
WBC: 11.1 10*3/uL — ABNORMAL HIGH (ref 4.0–10.5)
nRBC: 0 % (ref 0.0–0.2)

## 2021-01-08 LAB — TROPONIN I (HIGH SENSITIVITY)
Troponin I (High Sensitivity): <2 ng/L (ref <18)
Troponin I (High Sensitivity): <2 ng/L (ref <18)

## 2021-01-08 LAB — HEPATIC FUNCTION PANEL
ALT: 44 U/L (ref 0–44)
AST: 35 U/L (ref 15–41)
Albumin: 4.2 g/dL (ref 3.5–5.0)
Alkaline Phosphatase: 113 U/L (ref 38–126)
Bilirubin, Direct: <0.1 mg/dL (ref 0.0–0.2)
Total Bilirubin: 0.5 mg/dL (ref 0.3–1.2)
Total Protein: 8.4 g/dL — ABNORMAL HIGH (ref 6.5–8.1)

## 2021-01-08 LAB — LIPASE, BLOOD: Lipase: 21 U/L (ref 11–51)

## 2021-01-08 MED ORDER — MORPHINE SULFATE (PF) 4 MG/ML IV SOLN
4.0000 mg | Freq: Once | INTRAVENOUS | Status: AC
  Administered 2021-01-08: 4 mg via INTRAVENOUS
  Filled 2021-01-08: qty 1

## 2021-01-08 MED ORDER — IOHEXOL 350 MG/ML SOLN
75.0000 mL | Freq: Once | INTRAVENOUS | Status: AC | PRN
  Administered 2021-01-08: 75 mL via INTRAVENOUS

## 2021-01-08 MED ORDER — SODIUM CHLORIDE 0.9 % IV BOLUS
1000.0000 mL | Freq: Once | INTRAVENOUS | Status: AC
  Administered 2021-01-08: 1000 mL via INTRAVENOUS

## 2021-01-08 MED ORDER — ONDANSETRON HCL 4 MG/2ML IJ SOLN
4.0000 mg | Freq: Once | INTRAMUSCULAR | Status: AC
  Administered 2021-01-08: 4 mg via INTRAVENOUS
  Filled 2021-01-08: qty 2

## 2021-01-08 NOTE — ED Provider Notes (Signed)
Care assumed from [facility], [place], at shift change, please see their notes for full documentation of patient's complaint/HPI. Briefly, pt here with mid and left lower chest pain x 2 days with recent travel and history of metastatic neuroendocrine tumor with mets to spine. Results so far show mildly elevated leukocytosis however RBC and HCT also elevated; likely s/2 dehydration, fluids running. CXR clear and troponin < 2. Awaiting CTA Pe study. Plan is to dispo accordingly.   Physical Exam  BP (!) 152/94   Pulse 92   Temp 98.5 F (36.9 C) (Oral)   Resp 18   Ht 5\' 3"  (1.6 m)   Wt 85.7 kg   LMP 10/26/2017   SpO2 100%   BMI 33.48 kg/m   Physical Exam Vitals and nursing note reviewed.  Constitutional:      Appearance: She is not ill-appearing.  HENT:     Head: Normocephalic and atraumatic.  Eyes:     Conjunctiva/sclera: Conjunctivae normal.  Cardiovascular:     Rate and Rhythm: Normal rate and regular rhythm.  Pulmonary:     Effort: Pulmonary effort is normal.     Breath sounds: Normal breath sounds.  Skin:    General: Skin is warm and dry.     Coloration: Skin is not jaundiced.  Neurological:     Mental Status: She is alert.    ED Course/Procedures     Procedures  Results for orders placed or performed during the hospital encounter of 50/03/70  Basic metabolic panel  Result Value Ref Range   Sodium 137 135 - 145 mmol/L   Potassium 4.1 3.5 - 5.1 mmol/L   Chloride 98 98 - 111 mmol/L   CO2 25 22 - 32 mmol/L   Glucose, Bld 145 (H) 70 - 99 mg/dL   BUN 10 6 - 20 mg/dL   Creatinine, Ser 0.88 0.44 - 1.00 mg/dL   Calcium 10.0 8.9 - 10.3 mg/dL   GFR, Estimated >60 >60 mL/min   Anion gap 14 5 - 15  CBC  Result Value Ref Range   WBC 11.1 (H) 4.0 - 10.5 K/uL   RBC 5.29 (H) 3.87 - 5.11 MIL/uL   Hemoglobin 14.4 12.0 - 15.0 g/dL   HCT 47.8 (H) 36.0 - 46.0 %   MCV 90.4 80.0 - 100.0 fL   MCH 27.2 26.0 - 34.0 pg   MCHC 30.1 30.0 - 36.0 g/dL   RDW 14.0 11.5 - 15.5 %    Platelets 281 150 - 400 K/uL   nRBC 0.0 0.0 - 0.2 %  Hepatic function panel  Result Value Ref Range   Total Protein 8.4 (H) 6.5 - 8.1 g/dL   Albumin 4.2 3.5 - 5.0 g/dL   AST 35 15 - 41 U/L   ALT 44 0 - 44 U/L   Alkaline Phosphatase 113 38 - 126 U/L   Total Bilirubin 0.5 0.3 - 1.2 mg/dL   Bilirubin, Direct <0.1 0.0 - 0.2 mg/dL   Indirect Bilirubin NOT CALCULATED 0.3 - 0.9 mg/dL  Lipase, blood  Result Value Ref Range   Lipase 21 11 - 51 U/L  Troponin I (High Sensitivity)  Result Value Ref Range   Troponin I (High Sensitivity) <2 <18 ng/L  Troponin I (High Sensitivity)  Result Value Ref Range   Troponin I (High Sensitivity) <2 <18 ng/L   DG Chest 2 View  Result Date: 01/08/2021 CLINICAL DATA:  Chest pain, hypertension EXAM: CHEST - 2 VIEW COMPARISON:  PET-CT dated 10/15/2020 FINDINGS: Linear/platelike scarring  in the right perihilar region. No focal consolidation. No pleural effusion or pneumothorax. Heart is normal in size. Visualized osseous structures are within normal limits. IMPRESSION: Normal chest radiographs. Electronically Signed   By: Julian Hy M.D.   On: 01/08/2021 00:07   CT Angio Chest PE W and/or Wo Contrast  Result Date: 01/08/2021 CLINICAL DATA:  60 year old female with history of left-sided posterior shoulder pain and intermittent epigastric pain for the past few days. EXAM: CT ANGIOGRAPHY CHEST WITH CONTRAST TECHNIQUE: Multidetector CT imaging of the chest was performed using the standard protocol during bolus administration of intravenous contrast. Multiplanar CT image reconstructions and MIPs were obtained to evaluate the vascular anatomy. CONTRAST:  46mL OMNIPAQUE IOHEXOL 350 MG/ML SOLN COMPARISON:  Chest CT 11/05/2019. FINDINGS: Cardiovascular: No filling defects within the pulmonary arterial tree to suggest pulmonary embolism. Heart size is normal. There is no significant pericardial fluid, thickening or pericardial calcification. Aortic atherosclerosis. No  definite coronary artery calcifications. Mediastinum/Nodes: No pathologically enlarged mediastinal or hilar lymph nodes. Please note that accurate exclusion of hilar adenopathy is limited on noncontrast CT scans. Esophagus is unremarkable in appearance. No axillary lymphadenopathy. Lungs/Pleura: Mosaic attenuation throughout the lungs, suggesting underlying air trapping from small airways disease. No acute consolidative airspace disease. No pleural effusions. No suspicious appearing pulmonary nodules or masses are noted. Upper Abdomen: Unremarkable. Musculoskeletal: Widespread predominantly sclerotic lesions are noted throughout the visualized axial and appendicular skeleton, indicative of widespread metastatic disease to the bones, largest of which is in the T11 vertebral body measuring up to 1.9 cm in diameter. Review of the MIP images confirms the above findings. IMPRESSION: 1. No evidence of pulmonary embolism. 2. Mosaic attenuation in the lungs suggesting air trapping from small airway disease. No other acute findings are noted in the thorax to account for the patient's symptoms. 3. Widespread metastatic disease to the bones redemonstrated, as above. 4. Aortic atherosclerosis. Aortic Atherosclerosis (ICD10-I70.0). Electronically Signed   By: Vinnie Langton M.D.   On: 01/08/2021 07:53    MDM  CTA without findings to suggest PE. Mosaic attenuation in the lungs suggesting air trapping from small airway disease; no other acute findings. Known metastatic disease. Given overall reassuring workup will discharge home with PCP follow up. Heart rate has improved with IVF.   This note was prepared using Dragon voice recognition software and may include unintentional dictation errors due to the inherent limitations of voice recognition software.         Eustaquio Maize, PA-C 01/08/21 [id number]    Gareth Morgan, MD 01/09/21 2110

## 2021-01-08 NOTE — ED Provider Notes (Signed)
[facility] DEPT Provider Note   CSN: [id number] Arrival date & time: 01/07/21  2315     History Chief Complaint  Patient presents with   Chest Pain    Michelle Solomon is a 60 y.o. female with a history of metastatic neuroendocrine tumor with metastasis to the spine, HTN, anemia, anxiety, asthma who presents the emergency department with a chief complaint of chest pain.  The patient reports that she developed intermittent pain in her mid and left lower chest 2 days ago that became constant over the last day.  She characterizes the pain as sharp.  She also is endorsing left shoulder pain that is worse with movement, but states that the pain in her chest does not radiate to her shoulder.  She denies any recent falls, injuries, new exercises, or activities related to her shoulder.  She does not think she slept on it awkwardly.  No history of previous left upper extremity injuries.  Since the pain became constant over the last 24 hours, it is continued to worsen.  She denies any known aggravating or alleviating factors.  She denies shortness of breath, back pain, nausea, vomiting, diarrhea, dizziness, lightheadedness, visual changes, numbness, weakness neck pain, or elbow pain.  No personal or familial history of VTE.  She smokes approximately 2 cigarettes/day.  No history of CAD.  She has been compliant with her home medications.  The patient reports that she and her sister drove to [place] and returned 4 days ago, 2 days prior to onset of her symptoms.  She reports that she is also receiving lanreotide injections once every 4 weeks.  The history is provided by the patient and medical records. No language interpreter was used.      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   Cancer ([place])    Neuro endocrine tumor   CONJUNCTIVITIS, ALLERGIC 11/27/2008   HEMORRHOIDS 06/16/2008   HTN (hypertension)    Hx of adenomatous colonic polyps 11/2019   NIPPLE DISCHARGE 01/21/2010   Other specified forms of hearing loss 10/08/2009   Overweight(278.02) 05/29/2007   RASH-NONVESICULAR 05/29/2007   Wheezing 06/22/2010    Patient Active Problem List   Diagnosis Date Noted   Metastatic malignant neuroendocrine tumor to lymph node ([place]) 12/20/2019   Pulmonary mass 08/30/2019   Vitamin D deficiency 08/30/2019   Insomnia 04/18/2018   Hot flashes 12/28/2017   Hyperglycemia 11/09/2017   Grief reaction 09/20/2017   Left sided sciatica 03/14/2017   Acne 06/10/2016   Frozen shoulder syndrome 05/12/2015   Right shoulder pain 04/30/2015   Breast pain, left 12/16/2014   Former smoker 12/16/2014   Anxiety and depression 12/16/2014   Hearing loss 04/05/2012   Encounter for well adult exam with abnormal findings 12/24/2010   Back pain 12/24/2010   CONJUNCTIVITIS, ALLERGIC 11/27/2008   Anal or rectal pain 06/23/2008   HEMORRHOIDS 06/16/2008   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   ANEMIA-IRON DEFICIENCY 05/29/2007   Allergic rhinitis 05/29/2007   Asthma 05/29/2007    Past Surgical History:  Procedure Laterality Date   COLONOSCOPY       OB History     Gravida  3   Para  3  Term      Preterm      AB      Living  3      SAB      IAB      Ectopic      Multiple      Live Births              Family History  Problem Relation Age of Onset   Allergies Sister    Diabetes Sister    Hypertension Sister    Hypertension Mother    Prostate cancer Father    Hypertension Brother    Hypertension Sister    Diabetes Paternal Aunt    Cancer Paternal Aunt        type unknown   Prostate cancer Brother    Stroke Maternal Aunt    Hypertension Maternal Aunt    Colon cancer Neg Hx     Esophageal cancer Neg Hx    Rectal cancer Neg Hx    Stomach cancer Neg Hx     Social History   Tobacco Use   Smoking status: Some Days    Packs/day: 0.50    Years: 40.00    Pack years: 20.00    Types: Cigarettes    Last attempt to quit: 10/22/2019    Years since quitting: 1.2   Smokeless tobacco: Never   Tobacco comments:    1 pack weekly.  started smoking at 60 years old  Vaping Use   Vaping Use: Never used  Substance Use Topics   Alcohol use: Yes    Comment: ocassional   Drug use: No    Home Medications Prior to Admission medications   Medication Sig Start Date End Date Taking? Authorizing Provider  ALPRAZolam Duanne Moron) 0.5 MG tablet Take 1 tablet (0.5 mg total) by mouth 2 (two) times daily as needed for anxiety or sleep. 08/16/18   Biagio Borg, MD  buPROPion (WELLBUTRIN XL) 300 MG 24 hr tablet TAKE 1 TABLET(300 MG) BY MOUTH DAILY 05/05/20   Biagio Borg, MD  cholecalciferol (VITAMIN D3) 25 MCG (1000 UNIT) tablet Take 1,000 Units by mouth daily.    [provider]  escitalopram (LEXAPRO) 20 MG tablet Take 20 mg by mouth daily. Patient not taking: Reported on 10/19/2020    [provider]  hydrochlorothiazide (HYDRODIURIL) 12.5 MG tablet TAKE 1 TABLET(12.5 MG) BY MOUTH DAILY 11/16/20   Biagio Borg, MD  hydrocortisone 2.5 % cream Apply topically 2 (two) times daily. 11/09/17   Biagio Borg, MD  iron polysaccharides (NU-IRON) 150 MG capsule Take 1 capsule (150 mg total) by mouth daily. 08/30/19   Biagio Borg, MD  loratadine (CLARITIN) 10 MG tablet Take 10 mg by mouth daily as needed for allergies.    [provider]  oxyCODONE-acetaminophen (PERCOCET/ROXICET) 5-325 MG tablet Take 1 tablet by mouth every 6 (six) hours as needed for severe pain. 09/28/20   Biagio Borg, MD  Turmeric 500 MG CAPS Take 500 mg by mouth daily.    [provider]  vitamin B-12 (CYANOCOBALAMIN) 100 MCG tablet Take 100 mcg by mouth daily.    [provider]   VITAMIN D-VITAMIN K PO Take 1 tablet by mouth daily.    [provider]  zolpidem (AMBIEN) 10 MG tablet Take 1 tablet (10 mg total) by mouth at bedtime as needed for up to 30 days for sleep. 08/01/18 12/11/19  Biagio Borg, MD    Allergies    Shellfish  allergy  Review of Systems   Review of Systems  Constitutional:  Negative for activity change, chills and fever.  HENT:  Negative for congestion, sinus pressure, sinus pain and sore throat.   Respiratory:  Negative for shortness of breath and wheezing.   Cardiovascular:  Positive for chest pain. Negative for palpitations and leg swelling.  Gastrointestinal:  Negative for abdominal pain, constipation, diarrhea, nausea and vomiting.  Genitourinary:  Negative for dysuria.  Musculoskeletal:  Positive for arthralgias and myalgias. Negative for back pain, gait problem, joint swelling, neck pain and neck stiffness.  Skin:  Negative for rash and wound.  Allergic/Immunologic: Negative for immunocompromised state.  Neurological:  Negative for seizures, syncope, weakness, numbness and headaches.  Psychiatric/Behavioral:  Negative for confusion.    Physical Exam Updated Vital Signs BP (!) 152/94   Pulse 92   Temp 98.5 F (36.9 C) (Oral)   Resp 18   Ht 5\' 3"  (1.6 m)   Wt 85.7 kg   LMP 10/26/2017   SpO2 100%   BMI 33.48 kg/m   Physical Exam Vitals and nursing note reviewed.  Constitutional:      General: She is not in acute distress.    Comments: Well-appearing.  Nontoxic-appearing.  HENT:     Head: Normocephalic.  Eyes:     Conjunctiva/sclera: Conjunctivae normal.  Cardiovascular:     Rate and Rhythm: Regular rhythm. Tachycardia present.     Heart sounds: No murmur heard.   No friction rub. No gallop.  Pulmonary:     Effort: Pulmonary effort is normal. No respiratory distress.     Breath sounds: No stridor. No wheezing, rhonchi or rales.     Comments: No reproducible tenderness to palpation to the chest wall.  Lungs are  clear to auscultation bilaterally.  No increased work of breathing. Chest:     Chest wall: No tenderness.  Abdominal:     General: There is no distension.     Palpations: Abdomen is soft. There is no mass.     Tenderness: There is no abdominal tenderness. There is no right CVA tenderness, left CVA tenderness, guarding or rebound.     Hernia: No hernia is present.     Comments: Abdomen is soft, nontender, nondistended.  Musculoskeletal:     Cervical back: Neck supple.     Comments: Point tenderness to palpation to the left posterior shoulder.  Pain is increased with both active and passive range of motion.  Range of motion is intact.  Neurovascular intact to the bilateral upper extremities.  Good grip strength bilaterally.  Full active and passive range of motion of the left shoulder.  No tenderness to palpation.  Skin:    General: Skin is warm.     Findings: No rash.  Neurological:     Mental Status: She is alert.  Psychiatric:        Behavior: Behavior normal.    ED Results / Procedures / Treatments   Labs (all labs ordered are listed, but only abnormal results are displayed) Labs Reviewed  BASIC METABOLIC PANEL - Abnormal; Notable for the following components:      Result Value   Glucose, Bld 145 (*)    All other components within normal limits  CBC - Abnormal; Notable for the following components:   WBC 11.1 (*)    RBC 5.29 (*)    HCT 47.8 (*)    All other components within normal limits  HEPATIC FUNCTION PANEL - Abnormal; Notable for the following components:  Total Protein 8.4 (*)    All other components within normal limits  LIPASE, BLOOD  TROPONIN I (HIGH SENSITIVITY)  TROPONIN I (HIGH SENSITIVITY)    EKG EKG Interpretation  Date/Time:  Thursday January 07 2021 23:32:50 EDT Ventricular Rate:  117 PR Interval:  138 QRS Duration: 70 QT Interval:  310 QTC Calculation: 432 R Axis:   43 Text Interpretation: Sinus tachycardia Biatrial enlargement Abnormal ECG Rate  faster Confirmed by Ezequiel Essex [phone]) on 01/08/2021 2:06:08 AM  Radiology DG Chest 2 View  Result Date: 01/08/2021 CLINICAL DATA:  Chest pain, hypertension EXAM: CHEST - 2 VIEW COMPARISON:  PET-CT dated 10/15/2020 FINDINGS: Linear/platelike scarring in the right perihilar region. No focal consolidation. No pleural effusion or pneumothorax. Heart is normal in size. Visualized osseous structures are within normal limits. IMPRESSION: Normal chest radiographs. Electronically Signed   By: Julian Hy M.D.   On: 01/08/2021 00:07    Procedures Procedures   Medications Ordered in ED Medications  morphine 4 MG/ML injection 4 mg (4 mg Intravenous Given 01/08/21 0535)  ondansetron (ZOFRAN) injection 4 mg (4 mg Intravenous Given 01/08/21 0535)  sodium chloride 0.9 % bolus 1,000 mL (1,000 mLs Intravenous New Bag/Given 01/08/21 0536)    ED Course  I have reviewed the triage vital signs and the nursing notes.  Pertinent labs & imaging results that were available during my care of the patient were reviewed by me and considered in my medical decision making (see chart for details).    MDM Rules/Calculators/A&P                          60 year old female with a history of metastatic neuroendocrine tumor with metastasis to the spine, HTN, anemia, anxiety, asthma who presents to the emergency department who presents the emergency department with a chief complaint of chest pain.  Patient has been having intermittent chest pain for the last 2 days that became constant over the last 24 hours.  She is also endorsing severe, atraumatic left shoulder pain.  No other associated symptoms.  Tachycardic in the 120s on arrival.  Hypertensive.  No hypoxia.  Afebrile.  Labs imaging of been reviewed and inability interpreted by me.  EKG with sinus tachycardia.  No other acute findings.  Delta troponin is not elevated. HEAR score is 2.  No metabolic derangements.  CBC with mild leukocytosis.  No other focal  findings.  Given recent long travel with active metastatic neuroendocrine tumor with metastasis, tachycardia, and active chest pain, will order CT PE study.  Patient care transferred to [facility] at the end of my shift to follow-up on PE study.  If this study is reassuring, she can be discharged home with outpatient follow-up.  Patient presentation, ED course, and plan of care discussed with review of all pertinent labs and imaging. Please see his/her note for further details regarding further ED course and disposition.   Final Clinical Impression(s) / ED Diagnoses Final diagnoses:  None    Rx / DC Orders ED Discharge Orders     None        Joanne Gavel, PA-C 01/08/21 2707    Ezequiel Essex, MD 01/08/21 2010

## 2021-01-08 NOTE — Discharge Instructions (Addendum)
Your workup was overall reassuring in the ED today. There were no signs of blood clots of other abnormalities on your CT scan.   I would recommend continuation of your pain medication at home and PCP follow up.   Return to the ED for any new/worsening symptoms

## 2021-01-11 ENCOUNTER — Telehealth: Payer: Self-pay | Admitting: *Deleted

## 2021-01-11 ENCOUNTER — Telehealth: Payer: Self-pay | Admitting: Internal Medicine

## 2021-01-11 NOTE — Telephone Encounter (Signed)
Type of form received: Short Term Disability   Additional comments:   Received by: Fax   Form should be Faxed to: [phone]  Form should be mailed to: n/a  Is patient requesting call for pickup: n    Form placed in the Provider's box.  *Attach charge sheet.  Provider will determine charge.*  Was patient informed of  7-10 business day turn around (Y/N)? n

## 2021-01-11 NOTE — Telephone Encounter (Signed)
Returned call to patient. Advised that Dr. Lorenso Courier reviewed her recent CTA scan and found it to be stable, no new findings. Also advised that that the order has been placed with [facility] and provided the phone # for her to call to set up her appt.  Michelle Solomon states that she checked the appt @ [facility]. It is with an MD that Dr. Leamon Arnt referred her to. She states she will go and see what they have to say and then let us know the outcome of that visit.  Advised that I did receive Short Term disability forms from her emp[loer and sent them on the [facility], RN  . 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 Pt voiced understanding.

## 2021-01-11 NOTE — Telephone Encounter (Signed)
1) Please let Mrs. Manny know that I reviewed the CT scan. The scan appears stable with no new or concerning findings 2) The visit with Dr. Leamon Arnt is a follow up visit. If she does not wish to go then it would be reasonable to cancel that appointment and return if there are additional concerns.  3) Order has been placed for [facility] Consult. Please provide patient with scheduling number to reach out to [facility] ([phone])   Please have or call if she has any additional questions or concerns.

## 2021-01-11 NOTE — Telephone Encounter (Signed)
Received vm message from patient. She states she went to the ED on Friday evening and was there until Saturday morning for non-specific chest pain.  She had a CT A done and wanted Dr. Lorenso Courier to review the results with her. Pt was discharged home. She is also asking about a 2nd appt at [facility] she has that. She saw D.r Leamon Arnt there for a 2nd opinion in May. Likely this is a follow up to that appt.  Of note-it does not appear that pt has seen the Nuclear Medicine Radiologist yet  Please advise

## 2021-01-12 ENCOUNTER — Other Ambulatory Visit (HOSPITAL_COMMUNITY): Payer: Self-pay | Admitting: Hematology and Oncology

## 2021-01-12 DIAGNOSIS — C7A8 Other malignant neuroendocrine tumors: Secondary | ICD-10-CM

## 2021-01-12 NOTE — Telephone Encounter (Signed)
Patient notified she is in need of an office visit.

## 2021-01-14 ENCOUNTER — Other Ambulatory Visit: Payer: Self-pay

## 2021-01-14 ENCOUNTER — Telehealth: Payer: Self-pay

## 2021-01-14 ENCOUNTER — Ambulatory Visit (INDEPENDENT_AMBULATORY_CARE_PROVIDER_SITE_OTHER): Payer: 59 | Admitting: Internal Medicine

## 2021-01-14 ENCOUNTER — Encounter: Payer: Self-pay | Admitting: Internal Medicine

## 2021-01-14 VITALS — BP 140/78 | HR 93 | Temp 98.1°F | Ht 63.0 in | Wt 190.0 lb

## 2021-01-14 DIAGNOSIS — R079 Chest pain, unspecified: Secondary | ICD-10-CM | POA: Diagnosis not present

## 2021-01-14 DIAGNOSIS — I700 Atherosclerosis of aorta: Secondary | ICD-10-CM

## 2021-01-14 DIAGNOSIS — R739 Hyperglycemia, unspecified: Secondary | ICD-10-CM

## 2021-01-14 DIAGNOSIS — E559 Vitamin D deficiency, unspecified: Secondary | ICD-10-CM

## 2021-01-14 NOTE — Patient Instructions (Signed)
The cause of your pain is not clear but at least the pain has resolved and all the testing is negative  Please continue all other medications as before, and refills have been done if requested.  Please have the pharmacy call with any other refills you may need.  Please continue your efforts at being more active, low cholesterol diet, and weight control.  Please keep your appointments with your specialists as you may have planned

## 2021-01-14 NOTE — Assessment & Plan Note (Signed)
Last vitamin D Lab Results  Component Value Date   VD25OH 62.09 09/21/2020   Stable, cont oral replacement

## 2021-01-14 NOTE — Assessment & Plan Note (Signed)
Lab Results  Component Value Date   HGBA1C 6.4 09/21/2020   Stable, pt to continue current medical treatment - diet

## 2021-01-14 NOTE — Assessment & Plan Note (Signed)
Pt declines statin, to continue diet, excericise, low chol diet

## 2021-01-14 NOTE — Telephone Encounter (Signed)
Patient notified of completed Short Term Disability paperwork. Fax transmission confirmation received from [employer]. Paperwork forwarded to Health Information Management with signed Release of Information and Copy of paperwork provided to Patient.

## 2021-01-14 NOTE — Assessment & Plan Note (Signed)
Atypical, etiology unclear, suspect msk but cant r/o cardiac completely, but pt declines for stress testing at this time

## 2021-01-14 NOTE — Progress Notes (Signed)
Patient ID: Michelle Solomon, female   DOB: 19-Jun-1961, 60 y.o.   MRN: [id number]        Chief Complaint: follow up ED visit July 8 with CP, and polycythemia though possible due to low volume with tachycardia       HPI:  Michelle Solomon is a 60 y.o. female here with above, with PE, MI and major illness ruled out, not felt to be malignant pain after also consulted with oncology.  Pain was 6  days SSCP, pressure and tightness, dull, no radiation, without sob, palps, dizziness.  Pt did actually vomit recently on a trip and returned home on July 5, then pain started the next 2 days, lasting close to 1 wk, did try TUMS x 2 pills one time but did not seem to help.  No wt loss, Denies worsening abd pain, dysphagia, bowel change or blood.  SSCP non pleuritic, nonexertional, non positional.  No fever, chills, though are having occasional sweats occaisionally starting recently.  Was tx with morphin in ED, no further pain since then.  Stil smoking occasional cigarrette, not ready to quit.   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 She has a 20.00 pack-year smoking history. She has never used smokeless tobacco. She reports current alcohol use. She reports that she does not use drugs. family history includes Allergies in her sister; Cancer in her paternal aunt; Diabetes in her paternal aunt and sister; Hypertension in her brother, maternal aunt, mother, sister, and sister; Prostate cancer in her brother and father; Stroke in her maternal aunt. Allergies  Allergen Reactions   Shellfish Allergy Anaphylaxis   Current Outpatient Medications on File Prior to Visit  Medication Sig Dispense Refill   ALPRAZolam (XANAX) 0.5 MG tablet Take 1 tablet (0.5 mg total) by mouth 2 (two) times daily as needed for anxiety or sleep. 60 tablet 2   buPROPion (WELLBUTRIN XL) 300 MG 24 hr tablet TAKE 1 TABLET(300 MG) BY MOUTH DAILY 90 tablet 1   cholecalciferol (VITAMIN D3) 25 MCG (1000 UNIT) tablet Take 1,000 Units by mouth daily.     escitalopram (LEXAPRO) 20 MG tablet Take 20 mg by mouth daily.     hydrochlorothiazide (HYDRODIURIL) 12.5 MG tablet TAKE 1 TABLET(12.5 MG) BY MOUTH DAILY 90 tablet 3   hydrocortisone  2.5 % cream Apply topically 2 (two) times daily. 30 g 1   iron polysaccharides (NU-IRON) 150 MG capsule Take 1 capsule (150 mg total) by mouth daily. 90 capsule 1   loratadine (CLARITIN) 10 MG tablet Take 10 mg by mouth daily as needed for allergies.     oxyCODONE-acetaminophen (PERCOCET/ROXICET) 5-325 MG tablet Take 1 tablet by mouth every 6 (six) hours as needed for severe pain. 30 tablet 0   Turmeric 500 MG CAPS Take 500 mg by mouth daily.     vitamin B-12 (CYANOCOBALAMIN) 100 MCG tablet Take 100 mcg by mouth daily.     VITAMIN D-VITAMIN K PO Take 1 tablet by mouth daily.     zolpidem (AMBIEN) 10 MG tablet Take 1 tablet (10 mg total) by mouth at bedtime as needed for up to 30 days for sleep. 30 tablet 5    No current facility-administered medications on file prior to visit.        ROS:  All others reviewed and negative.  Objective        PE:  BP 140/78 (BP Location: Left Arm, Patient Position: Sitting, Cuff Size: Normal)   Pulse 93   Temp 98.1 F (36.7 C) (Oral)   Ht 5\' 3"  (1.6 m)   Wt 190 lb (86.2 kg)   LMP 10/26/2017   SpO2 98%   BMI 33.66 kg/m                 Constitutional: Pt appears in NAD               HENT: Head: NCAT.                Right Ear: External ear normal.                 Left Ear: External ear normal.                Eyes: . Pupils are equal, round, and reactive to light. Conjunctivae and EOM are normal               Nose: without d/c or deformity               Neck: Neck supple. Gross normal ROM               Cardiovascular: Normal rate and regular rhythm.                 Pulmonary/Chest: Effort normal and breath sounds without rales or wheezing.                Abd:  Soft, NT, ND, + BS, no organomegaly               Neurological: Pt is alert. At baseline orientation, motor grossly intact               Skin: Skin is warm. No rashes, no other new lesions, LE edema - none               Psychiatric: Pt behavior is normal without agitation   Micro: none  Cardiac tracings I have personally interpreted today:  none  Pertinent Radiological findings (summarize): none   Lab Results  Component Value Date   WBC 11.1 (H) 01/07/2021   HGB 14.4 01/07/2021   HCT 47.8 (H) 01/07/2021   PLT 281 01/07/2021   GLUCOSE 145 (H) 01/07/2021   CHOL 196 09/21/2020   TRIG 64.0 09/21/2020   HDL 77.20 09/21/2020  LDLCALC 106 (H) 09/21/2020   ALT 44 01/08/2021   AST 35 01/08/2021   NA 137 01/07/2021   K 4.1 01/07/2021   CL 98 01/07/2021   CREATININE 0.88 01/07/2021   BUN 10 01/07/2021   CO2 25 01/07/2021   TSH 0.62 09/21/2020   HGBA1C 6.4 09/21/2020   Assessment/Plan:  Michelle Solomon is a 60 y.o. Black or African American [2] female with  has a past medical history of  Acute bronchitis (06/06/2010), ALLERGIC RHINITIS (05/29/2007), Allergy, Anal or rectal pain (06/23/2008), ANEMIA-IRON DEFICIENCY (05/29/2007), Anxiety, ASTHMA (05/29/2007), Asthma, Benign carcinoid tumor of the rectum (05/26/2008), Cancer ([place]), CONJUNCTIVITIS, ALLERGIC (11/27/2008), HEMORRHOIDS (06/16/2008), HTN (hypertension), adenomatous colonic polyps (11/2019), NIPPLE DISCHARGE (01/21/2010), Other specified forms of hearing loss (10/08/2009), Overweight(278.02) (05/29/2007), RASH-NONVESICULAR (05/29/2007), and Wheezing (06/22/2010).  Chest pain Atypical, etiology unclear, suspect msk but cant r/o cardiac completely, but pt declines for stress testing at this time  Hyperglycemia Lab Results  Component Value Date   HGBA1C 6.4 09/21/2020   Stable, pt to continue current medical treatment - diet   Aortic atherosclerosis ([place]) Pt declines statin, to continue diet, excericise, low chol diet  Vitamin D deficiency Last vitamin D Lab Results  Component Value Date   VD25OH 62.09 09/21/2020   Stable, cont oral replacement  Followup: Return if symptoms worsen or fail to improve.  Cathlean Cower, MD 01/14/2021 2:01 PM [facility]

## 2021-01-21 ENCOUNTER — Telehealth: Payer: Self-pay | Admitting: *Deleted

## 2021-01-21 NOTE — Telephone Encounter (Signed)
TCT patient regarding her appt with Dr. Caprice Beaver @ [facility], her upcoming appt with nuclear medicine and my conversation with her insurance regarding approval for her possible Lutathera treatment with nuclear medicine.  Spoke with her. She did say that nuclear medicine was having a bit of trouble getting her Lutathera approved when she talked to them earlier in the week. Advised that I had spoken with her insurance late yesterday afternoon and advised that it is conditionally approved. (Draft approval # is [id number]) Advised that for final approval her insurance would stop the approval for her Lanreotide.  I told Bette that I advised her insurance that she  (the pt) has not yet made a decision about the Lutathera as she had not met with them yet. Pt voiced understanding. Advised that I called Tayloe @ nuclear medicine regarding the approval of Lutathera, and asked her to set up the appt there. Pt voiced understanding.  Pt also states she met with Dr. Caprice Beaver @ [facility] and said that he had mentioned  'mantle cell lymphoma' to her as another diagnosis. She asked to have an appointment with Dr. Lorenso Courier tomorrow morning before her injection of Lanreotide to discuss this with him.  Advised that I would make her lab appt at 8:15 and Dr. Lorenso Courier @ 8:40 am, as her injection appt is at 9 am.  Dr. Lorenso Courier made aware of the above. Lovena Le in nuclear med made aware of appt need and draft approval of Lutathera.

## 2021-01-22 ENCOUNTER — Other Ambulatory Visit: Payer: Self-pay | Admitting: Hematology and Oncology

## 2021-01-22 ENCOUNTER — Inpatient Hospital Stay: Payer: 59

## 2021-01-22 ENCOUNTER — Other Ambulatory Visit: Payer: Self-pay

## 2021-01-22 ENCOUNTER — Inpatient Hospital Stay: Payer: 59 | Attending: Hematology and Oncology

## 2021-01-22 ENCOUNTER — Inpatient Hospital Stay (HOSPITAL_BASED_OUTPATIENT_CLINIC_OR_DEPARTMENT_OTHER): Payer: 59 | Admitting: Hematology and Oncology

## 2021-01-22 VITALS — BP 156/94 | HR 79 | Temp 97.9°F | Resp 19 | Ht 63.0 in | Wt 188.3 lb

## 2021-01-22 DIAGNOSIS — C7A025 Malignant carcinoid tumor of the sigmoid colon: Secondary | ICD-10-CM | POA: Diagnosis present

## 2021-01-22 DIAGNOSIS — C7B8 Other secondary neuroendocrine tumors: Secondary | ICD-10-CM

## 2021-01-22 DIAGNOSIS — Z79899 Other long term (current) drug therapy: Secondary | ICD-10-CM | POA: Diagnosis not present

## 2021-01-22 DIAGNOSIS — M899 Disorder of bone, unspecified: Secondary | ICD-10-CM | POA: Diagnosis not present

## 2021-01-22 DIAGNOSIS — C7A8 Other malignant neuroendocrine tumors: Secondary | ICD-10-CM

## 2021-01-22 DIAGNOSIS — C7951 Secondary malignant neoplasm of bone: Secondary | ICD-10-CM | POA: Insufficient documentation

## 2021-01-22 LAB — CBC WITH DIFFERENTIAL (CANCER CENTER ONLY)
Abs Immature Granulocytes: 0.01 10*3/uL (ref 0.00–0.07)
Basophils Absolute: 0 10*3/uL (ref 0.0–0.1)
Basophils Relative: 1 %
Eosinophils Absolute: 0.2 10*3/uL (ref 0.0–0.5)
Eosinophils Relative: 3 %
HCT: 35.9 % — ABNORMAL LOW (ref 36.0–46.0)
Hemoglobin: 11.6 g/dL — ABNORMAL LOW (ref 12.0–15.0)
Immature Granulocytes: 0 %
Lymphocytes Relative: 42 %
Lymphs Abs: 3.3 10*3/uL (ref 0.7–4.0)
MCH: 27.4 pg (ref 26.0–34.0)
MCHC: 32.3 g/dL (ref 30.0–36.0)
MCV: 84.9 fL (ref 80.0–100.0)
Monocytes Absolute: 0.7 10*3/uL (ref 0.1–1.0)
Monocytes Relative: 9 %
Neutro Abs: 3.4 10*3/uL (ref 1.7–7.7)
Neutrophils Relative %: 45 %
Platelet Count: 255 10*3/uL (ref 150–400)
RBC: 4.23 MIL/uL (ref 3.87–5.11)
RDW: 13.2 % (ref 11.5–15.5)
WBC Count: 7.7 10*3/uL (ref 4.0–10.5)
nRBC: 0 % (ref 0.0–0.2)

## 2021-01-22 LAB — CMP (CANCER CENTER ONLY)
ALT: 17 U/L (ref 0–44)
AST: 17 U/L (ref 15–41)
Albumin: 3.7 g/dL (ref 3.5–5.0)
Alkaline Phosphatase: 89 U/L (ref 38–126)
Anion gap: 6 (ref 5–15)
BUN: 13 mg/dL (ref 6–20)
CO2: 30 mmol/L (ref 22–32)
Calcium: 9.2 mg/dL (ref 8.9–10.3)
Chloride: 106 mmol/L (ref 98–111)
Creatinine: 0.83 mg/dL (ref 0.44–1.00)
GFR, Estimated: >60 mL/min (ref >60)
Glucose, Bld: 130 mg/dL — ABNORMAL HIGH (ref 70–99)
Potassium: 4 mmol/L (ref 3.5–5.1)
Sodium: 142 mmol/L (ref 135–145)
Total Bilirubin: 0.4 mg/dL (ref 0.3–1.2)
Total Protein: 7 g/dL (ref 6.5–8.1)

## 2021-01-22 MED ORDER — LANREOTIDE ACETATE 120 MG/0.5ML ~~LOC~~ SOLN
120.0000 mg | Freq: Once | SUBCUTANEOUS | Status: AC
  Administered 2021-01-22: 120 mg via SUBCUTANEOUS
  Filled 2021-01-22: qty 120

## 2021-01-22 NOTE — Patient Instructions (Signed)
Lanreotide injection What is this medication? LANREOTIDE (lan REE oh tide) is used to reduce blood levels of growth hormone in patients with a condition called acromegaly. It also works to slow or stop tumor growth in patients with neuroendocrine tumors and treat carcinoid syndrome. This medicine may be used for other purposes; ask your health care provider or pharmacist if you have questions. COMMON BRAND NAME(S): Somatuline Depot What should I tell my care team before I take this medication? They need to know if you have any of these conditions: diabetes gallbladder disease heart disease kidney disease liver disease thyroid disease an unusual or allergic reaction to lanreotide, other medicines, foods, dyes, or preservatives pregnant or trying to get pregnant breast-feeding How should I use this medication? This medicine is for injection under the skin. It is given by a health care professional in a hospital or clinic setting. Contact your pediatrician or health care professional regarding the use of this medicine in children. Special care may be needed. Overdosage: If you think you have taken too much of this medicine contact a poison control center or emergency room at once. NOTE: This medicine is only for you. Do not share this medicine with others. What if I miss a dose? It is important not to miss your dose. Call your doctor or health care professional if you are unable to keep an appointment. What may interact with this medication? This medicine may interact with the following medications: bromocriptine cyclosporine certain medicines for blood pressure, heart disease, irregular heart beat certain medicines for diabetes quinidine terfenadine This list may not describe all possible interactions. Give your health care provider a list of all the medicines, herbs, non-prescription drugs, or dietary supplements you use. Also tell them if you smoke, drink alcohol, or use illegal drugs.  Some items may interact with your medicine. What should I watch for while using this medication? Tell your doctor or healthcare professional if your symptoms do not start to get better or if they get worse. Visit your doctor or health care professional for regular checks on your progress. Your condition will be monitored carefully while you are receiving this medicine. This medicine may increase blood sugar. Ask your healthcare provider if changes in diet or medicines are needed if you have diabetes. You may need blood work done while you are taking this medicine. Women should inform their doctor if they wish to become pregnant or think they might be pregnant. There is a potential for serious side effects to an unborn child. Talk to your health care professional or pharmacist for more information. Do not breast-feed an infant while taking this medicine or for 6 months after stopping it. This medicine has caused ovarian failure in some women. This medicine may interfere with the ability to have a child. Talk with your doctor or health care professional if you are concerned about your fertility. What side effects may I notice from receiving this medication? Side effects that you should report to your doctor or health care professional as soon as possible: allergic reactions like skin rash, itching or hives, swelling of the face, lips, or tongue increased blood pressure severe stomach pain signs and symptoms of hgh blood sugar such as being more thirsty or hungry or having to urinate more than normal. You may also feel very tired or have blurry vision. signs and symptoms of low blood sugar such as feeling anxious; confusion; dizziness; increased hunger; unusually weak or tired; sweating; shakiness; cold; irritable; headache; blurred vision; fast   heartbeat; loss of consciousness unusually slow heartbeat Side effects that usually do not require medical attention (report to your doctor or health care  professional if they continue or are bothersome): constipation diarrhea dizziness headache muscle pain muscle spasms nausea pain, redness, or irritation at site where injected This list may not describe all possible side effects. Call your doctor for medical advice about side effects. You may report side effects to FDA at 1-800-FDA-1088. Where should I keep my medication? This drug is given in a hospital or clinic and will not be stored at home. NOTE: This sheet is a summary. It may not cover all possible information. If you have questions about this medicine, talk to your doctor, pharmacist, or health care provider.  2022 Elsevier/Gold Standard (2018-03-29 09:13:08)  

## 2021-01-22 NOTE — Progress Notes (Signed)
[facility] [phone]   [phone]  PROGRESS NOTE  Patient Care Team: Biagio Borg, MD as PCP - General  Hematological/Oncological History # Metastatic Neuroendocrine Tumor, Metastasis to the Spine 1) 08/26/2019: CT Abdomen Pelvis performed due to RUQ abdominal pain. Imaging showed a soft tissue mass abutting the right posterior eighth rib 2) 09/06/2019: CT chest performed which showed Ill-defined soft tissue nodule along the medial right posterior thoracic wall at the 8th intercostal space to the right of the spine. MRI was recommended. 3) 10/19/2019: MRI Thoracic spine showed enhancing lesions throughout the visualized spine consistent with metastatic disease. Additionally there was a 1.5 cm soft tissue nodule in the posteromedial right eighth intercostal space, more concerning for a metastasis 4) 10/25/2019: establish care with Dr. Lorenso Courier   5) 11/05/2019: attempted biopsy of soft tissue mass at right posterior 8th rib, but lesion had dissipated at time of biopsy attempt 6) 11/27/2019: PET CT scan performed showing right inguinal lymph nodes and osseous lesions, indicative of metastatic disease of unknown primary. 7) 12/11/2019: CT bone marrow biopsy showed metastatic neuroendocrine neoplasm 8) 12/27/2019: start of lanreotide 137m sub q28 days  9) 10/15/2020: NM PET (CU-64) scan showed widespread osseous and lymphatic uptake consistent with metastatic spread of neuroendocrine tumor.  This is more avid than prior. 01/20/2021: clinic visit with Dr. MCaprice Beaverdue to concern for possible mantle cell lymphoma seen on pathology review of previous biopsy   #Low Grade Neuroendocrine Tumor 1) 2006: reportedly had rectal carcinoid tumor removed 2) 08/14/2008: patient had a flexibile sigmoidoscopy with endoscopic ultrasound which resected an 840m subepithelial lesion in rectum. Findings consistent with low grade neuroendocrine tumor. 3) 04/08/2010: repeat colonoscopy, no  residual tumor. Repeat recommended in 2016.  4) 12/11/2019: metastatic recurrence found on biopsy, noted above.   #Mantle Cell Lymphoma 11/24/2020: incidental findings of low level mantle cell lymphoma on bone marrow biopsy. [facility] 01/20/2021: clinic visit with Dr. McCaprice Beaverue to concern for possible mantle cell lymphoma seen on pathology review of previous biopsy   Interval History:  Michelle [age]Teem89.o. female with medical history significant for metastatic neuroendocrine tumor of the colon who presents for a follow up visit. The patient's last visit was on 12/25/2020 to assure continued tolerance of lanreotide therapy. In the interim since the last visit the patient has had [facility] to concern for mantle cell lymphoma on prior bone marrow biopsy.    On exam today Michelle Solomon accompanied by her daughter.  She reports she is deeply concerned about the recent findings consistent with mantle cell lymphoma and requested an earlier visit with [place]  She has met with Dr. McCaprice Beavernd is currently scheduled for a PET scan later this month.  She notes that she does have a dental appointment coming up next week in order to help get her clearance for Zometa.  She continues to have lower back pain but denies any new symptoms.  She does continue to have some occasional diarrhea typically for a few days a day or 2 after her lanreotide shot.  Otherwise at baseline she does not have any GI upset.  Her weight has been stable and she has had no fevers, chills, sweats, nausea, vomiting or diarrhea.  She notes that she is tolerating the shots well without any nausea, vomiting, or diarrhea.  She does not have any other questions or concerns today.  A full 10 point ROS is listed below.  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   Cancer ([place])    Neuro endocrine tumor   CONJUNCTIVITIS, ALLERGIC 11/27/2008   HEMORRHOIDS 06/16/2008   HTN (hypertension)    Hx of adenomatous colonic polyps 11/2019   NIPPLE DISCHARGE 01/21/2010   Other specified forms of hearing loss 10/08/2009   Overweight(278.02) 05/29/2007   RASH-NONVESICULAR 05/29/2007   Wheezing 06/22/2010    SURGICAL HISTORY: Past Surgical History:  Procedure Laterality Date   COLONOSCOPY      SOCIAL HISTORY: Social History   Socioeconomic History   Marital status: Married    Spouse name: Not on file   Number of children: 3   Years of education: Not on file   Highest education level: Not on file  Occupational History   Occupation: Vesper    Employer: [employer]   Occupation: customer service  Tobacco Use   Smoking status: Some Days    Packs/day: 0.50    Years: 40.00    Pack years: 20.00    Types: Cigarettes    Last attempt to quit: 10/22/2019    Years since quitting: 1.2   Smokeless tobacco: Never   Tobacco comments:    1 pack weekly.  started smoking at 60 years old  Vaping Use   Vaping Use: Never used  Substance and Sexual Activity   Alcohol use: Yes    Comment: ocassional   Drug use: No   Sexual activity: Not Currently  Other Topics Concern   Not on file  Social History Narrative   Daily caffeine use 3   Patient does not get regular exercise   Social Determinants of Health   Financial Resource Strain: Not on file  Food Insecurity: Not on file  Transportation Needs: Not on file  Physical Activity: Not on file  Stress: Not on file  Social Connections: Not on file  Intimate Partner Violence: Not on file    FAMILY HISTORY: Family History  Problem Relation Age of Onset   Allergies Sister    Diabetes Sister    Hypertension Sister     Hypertension Mother    Prostate cancer Father    Hypertension Brother    Hypertension Sister    Diabetes Paternal Aunt    Cancer Paternal Aunt        type unknown   Prostate cancer Brother    Stroke Maternal Aunt    Hypertension Maternal Aunt    Colon cancer Neg Hx    Esophageal cancer Neg Hx    Rectal cancer Neg Hx    Stomach cancer Neg Hx     ALLERGIES:  is allergic to shellfish allergy.  MEDICATIONS:  Current Outpatient Medications  Medication Sig Dispense Refill   ALPRAZolam (XANAX) 0.5 MG tablet Take 1 tablet (0.5 mg total) by mouth 2 (two) times daily as needed for anxiety or sleep. 60 tablet 2   buPROPion (WELLBUTRIN XL) 300 MG 24 hr tablet TAKE 1 TABLET(300 MG) BY MOUTH DAILY 90 tablet 1   cholecalciferol (VITAMIN D3) 25 MCG (1000 UNIT) tablet Take 1,000 Units by mouth daily.     escitalopram (LEXAPRO) 20 MG tablet Take 20  mg by mouth daily.     hydrochlorothiazide (HYDRODIURIL) 12.5 MG tablet TAKE 1 TABLET(12.5 MG) BY MOUTH DAILY 90 tablet 3   hydrocortisone 2.5 % cream Apply topically 2 (two) times daily. 30 g 1   iron polysaccharides (NU-IRON) 150 MG capsule Take 1 capsule (150 mg total) by mouth daily. 90 capsule 1   loratadine (CLARITIN) 10 MG tablet Take 10 mg by mouth daily as needed for allergies.     oxyCODONE-acetaminophen (PERCOCET/ROXICET) 5-325 MG tablet Take 1 tablet by mouth every 6 (six) hours as needed for severe pain. 30 tablet 0   Turmeric 500 MG CAPS Take 500 mg by mouth daily.     vitamin B-12 (CYANOCOBALAMIN) 100 MCG tablet Take 100 mcg by mouth daily.     VITAMIN D-VITAMIN K PO Take 1 tablet by mouth daily.     zolpidem (AMBIEN) 10 MG tablet Take 1 tablet (10 mg total) by mouth at bedtime as needed for up to 30 days for sleep. 30 tablet 5   No current facility-administered medications for this visit.    REVIEW OF SYSTEMS:   Constitutional: ( - ) fevers, ( - )  chills , ( - ) night sweats Eyes: ( - ) blurriness of vision, ( - ) double vision, ( -  ) watery eyes Ears, nose, mouth, throat, and face: ( - ) mucositis, ( - ) sore throat Respiratory: ( - ) cough, ( - ) dyspnea, ( - ) wheezes Cardiovascular: ( - ) palpitation, ( - ) chest discomfort, ( - ) lower extremity swelling Gastrointestinal:  ( - ) nausea, ( - ) heartburn, ( - ) change in bowel habits Skin: ( - ) abnormal skin rashes Lymphatics: ( - ) new lymphadenopathy, ( - ) easy bruising Neurological: ( - ) numbness, ( - ) tingling, ( - ) new weaknesses Behavioral/Psych: ( - ) mood change, ( - ) new changes  All other systems were reviewed with the patient and are negative.  PHYSICAL EXAMINATION: ECOG PERFORMANCE STATUS: 0 - Asymptomatic  Vitals:   01/22/21 0852  BP: (!) 156/94  Pulse: 79  Resp: 19  Temp: 97.9 F (36.6 C)  SpO2: 100%   Filed Weights   01/22/21 0852  Weight: 188 lb 4.8 oz (85.4 kg)    GENERAL: well appearing middle aged [place] American female in NAD  SKIN: skin color, texture, turgor are normal, no rashes or significant lesions EYES: conjunctiva are pink and non-injected, sclera clear LUNGS: clear to auscultation and percussion with normal breathing effort HEART: regular rate & rhythm and no murmurs and no lower extremity edema Musculoskeletal: no cyanosis of digits and no clubbing  PSYCH: alert & oriented x 3, fluent speech NEURO: no focal motor/sensory deficits  LABORATORY DATA:  I have reviewed the data as listed CBC Latest Ref Rng & Units 01/22/2021 01/07/2021 12/25/2020  WBC 4.0 - 10.5 K/uL 7.7 11.1(H) 7.5  Hemoglobin 12.0 - 15.0 g/dL 11.6(L) 14.4 12.9  Hematocrit 36.0 - 46.0 % 35.9(L) 47.8(H) 39.5  Platelets 150 - 400 K/uL 255 281 317    CMP Latest Ref Rng & Units 01/22/2021 01/08/2021 01/07/2021  Glucose 70 - 99 mg/dL 130(H) - 145(H)  BUN 6 - 20 mg/dL 13 - 10  Creatinine 0.44 - 1.00 mg/dL 0.83 - 0.88  Sodium 135 - 145 mmol/L 142 - 137  Potassium 3.5 - 5.1 mmol/L 4.0 - 4.1  Chloride 98 - 111 mmol/L 106 - 98  CO2 22 - 32 mmol/L 30 -  25   Calcium 8.9 - 10.3 mg/dL 9.2 - 10.0  Total Protein 6.5 - 8.1 g/dL 7.0 8.4(H) -  Total Bilirubin 0.3 - 1.2 mg/dL 0.4 0.5 -  Alkaline Phos 38 - 126 U/L 89 113 -  AST 15 - 41 U/L 17 35 -  ALT 0 - 44 U/L 17 44 -    RADIOGRAPHIC STUDIES: I have personally reviewed the radiological images as listed and agreed with the findings in the report.  DG Chest 2 View  Result Date: 01/08/2021 CLINICAL DATA:  Chest pain, hypertension EXAM: CHEST - 2 VIEW COMPARISON:  PET-CT dated 10/15/2020 FINDINGS: Linear/platelike scarring in the right perihilar region. No focal consolidation. No pleural effusion or pneumothorax. Heart is normal in size. Visualized osseous structures are within normal limits. IMPRESSION: Normal chest radiographs. Electronically Signed   By: Julian Hy M.D.   On: 01/08/2021 00:07   CT Angio Chest PE W and/or Wo Contrast  Result Date: 01/08/2021 CLINICAL DATA:  60 year old female with history of left-sided posterior shoulder pain and intermittent epigastric pain for the past few days. EXAM: CT ANGIOGRAPHY CHEST WITH CONTRAST TECHNIQUE: Multidetector CT imaging of the chest was performed using the standard protocol during bolus administration of intravenous contrast. Multiplanar CT image reconstructions and MIPs were obtained to evaluate the vascular anatomy. CONTRAST:  68m OMNIPAQUE IOHEXOL 350 MG/ML SOLN COMPARISON:  Chest CT 11/05/2019. FINDINGS: Cardiovascular: No filling defects within the pulmonary arterial tree to suggest pulmonary embolism. Heart size is normal. There is no significant pericardial fluid, thickening or pericardial calcification. Aortic atherosclerosis. No definite coronary artery calcifications. Mediastinum/Nodes: No pathologically enlarged mediastinal or hilar lymph nodes. Please note that accurate exclusion of hilar adenopathy is limited on noncontrast CT scans. Esophagus is unremarkable in appearance. No axillary lymphadenopathy. Lungs/Pleura: Mosaic attenuation  throughout the lungs, suggesting underlying air trapping from small airways disease. No acute consolidative airspace disease. No pleural effusions. No suspicious appearing pulmonary nodules or masses are noted. Upper Abdomen: Unremarkable. Musculoskeletal: Widespread predominantly sclerotic lesions are noted throughout the visualized axial and appendicular skeleton, indicative of widespread metastatic disease to the bones, largest of which is in the T11 vertebral body measuring up to 1.9 cm in diameter. Review of the MIP images confirms the above findings. IMPRESSION: 1. No evidence of pulmonary embolism. 2. Mosaic attenuation in the lungs suggesting air trapping from small airway disease. No other acute findings are noted in the thorax to account for the patient's symptoms. 3. Widespread metastatic disease to the bones redemonstrated, as above. 4. Aortic atherosclerosis. Aortic Atherosclerosis (ICD10-I70.0). Electronically Signed   By: DVinnie LangtonM.D.   On: 01/08/2021 07:53     ASSESSMENT & PLAN Michelle CBuehring89y.o. female with medical history significant for metastatic neuroendocrine tumor of the colon who presents for a follow up visit.  After review of the labs, review the pathology, and review the PET CT imaging the findings are most consistent with metastatic neuroendocrine tumor with metastasis to the spine.  The pathology currently appears to be consistent with the neuroendocrine tumor which was previously resected from the patient's rectum.  On exam today Ms. CPanebianconotes she is tolerating treatment well and ready and able to proceed with continued lanreotide therapy.  Based on the results of her prior imaging I have offered to her the option for Lu 177 Dotatate treatment.  She is willing and able to meet with the nuclear medicine physicians in order to discuss this treatment.  She is also had a second opinion  with Dr. Leamon Arnt at [facility].  Previously we discussed the nature of  metastatic neuroendocrine tumor and the treatment options moving forward.  Fortunately this is a slow-growing tumor and observation would be an appropriate treatment option.  I noted that we could do serial imaging to monitor the progression of the tumor and determine if treatment was required.  If the patient wanted to proceed with treatment we could consider lanreotide or octreotide subcu q. 28 days for control of the disease.  I noted that the side effects that could be expected would be nausea, vomiting, diarrhea, abdominal pain, and injection site reactions.  The patient noted that she would prefer to proceed with treatment.  We will plan to continue lanreotide 120 mg subcu q. 28 days with interval imaging approximately 3 months from the start of treatment.  The patient voiced her understanding of this plan and was eager to move forward.  # Metastatic Neuroendocrine Tumor, Metastasis to the Spine  --previously discussed with patient that the options would include observation with serial imaging or starting lanreotide therapy. The patient opted to start treatment --will plan for continued lanreotide 165m subq q28 days. Started therapy on 12/27/2019. This will be continued until progression or intolerance to therapy. Next dose due on 02/19/2021 --Due to the extensive involvement of her skeleton and increased activity on her last nuclear medicine scan it was recommended that she be considered for Lu 177 dotatate treatment.  I do believe this is a reasonable option for her.  We have made the referral to nuclear medicine.  She will be meeting with these providers in August to discuss the treatment --CU-64 scan q 3 months. Last in April 2022. [facility]  --Patient expressed interest in a second opinion.  [facility]  He provided excellent advice regarding options moving forward.  After hearing all the different options  the patient noted she would like to continue her current lanreotide shots with consideration of increased frequency if she were to be found to be progressing --currently working toward dental clearance for zometa.  --RTC in 3 month for interval visit.    #Mantle Cell Lymphoma -- Incidental finding from prior biopsy results.  [facility] --Unclear if this represents a small low-grade clinically insignificant population of mantle cell lymphoma or a more concerning issue --[facility]--[facility] CT scan planned for 01/29/2021 --We will continue to monitor and appreciate the guidance of Dr. MCaprice Beaver No orders of the defined types were placed in this encounter.  All questions were answered. The patient knows to call the clinic with any problems, questions or concerns.  A total of more than 30 minutes were spent on this encounter and over half of that time was spent on counseling and coordination of care as outlined above.   JLedell Peoples MD Department of Hematology/Oncology [facility]: [phone]: [phone]: [email or web address]  01/22/2021 10:26 AM

## 2021-01-27 ENCOUNTER — Other Ambulatory Visit: Payer: 59

## 2021-01-27 ENCOUNTER — Ambulatory Visit: Payer: 59 | Admitting: Hematology and Oncology

## 2021-02-11 ENCOUNTER — Ambulatory Visit (HOSPITAL_COMMUNITY)
Admission: RE | Admit: 2021-02-11 | Discharge: 2021-02-11 | Disposition: A | Discharge status: Home / Self Care | Payer: 59 | Source: Ambulatory Visit | Attending: Hematology and Oncology | Admitting: Hematology and Oncology

## 2021-02-11 ENCOUNTER — Ambulatory Visit (HOSPITAL_COMMUNITY): Payer: 59

## 2021-02-11 ENCOUNTER — Other Ambulatory Visit: Payer: Self-pay

## 2021-02-11 DIAGNOSIS — C7A8 Other malignant neuroendocrine tumors: Secondary | ICD-10-CM | POA: Insufficient documentation

## 2021-02-11 NOTE — Consult Note (Signed)
Chief Complaint: Patient with metastatic neuroendocrine tumor was for  evaluation Peptide receptor radiotherapy (PRRT) with [id number] DOTATATE (Lutathera).  Referring Physician(s):Dorsey    Patient Status: [facility] - Out-pt  History of Present Illness: Michelle Solomon is a 60 y.o. 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   Well differentiated neuroendocrine tumor apparently originate from a rectal lesion excised in 2006.   Patient has been asymptomatic since that time until 2021 when a paraspinal lesion was discovered on CT scan.  Subsequent FDG PET scan was significant for several foci of metastatic disease defined within the skeleton.  Subsequent biopsy demonstrated well differentiated neuroendocrine tumor.   Subsequent DOTATATE PET scan of 04/06/2020 demonstrated widespread intensely avid multifocal skeletal metastasis.  Follow-up DOTATATE PET scan of 10/15/2020 demonstrated increase in activity of multifocal lesions but no change in pattern.  In comparison to FDG PET scan 11/27/2019, the there is increased conspicuity of lesions on CT imaging.   Patient initiated lanreotide(120 mg)_ approximately 1 year prior.   Patient has minimal carcinoid symptoms.   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 Authorizing Provider  ALPRAZolam Duanne Moron) 0.5 MG tablet Take 1 tablet (0.5 mg total) by mouth 2 (two) times daily as needed for anxiety or sleep. 08/16/18   Biagio Borg, MD  buPROPion (WELLBUTRIN XL) 300 MG 24 hr tablet TAKE 1 TABLET(300 MG) BY MOUTH DAILY 05/05/20   Biagio Borg, MD  cholecalciferol (VITAMIN D3) 25 MCG (1000 UNIT) tablet Take 1,000 Units by mouth daily.    [provider]  escitalopram (LEXAPRO) 20 MG tablet Take 20 mg by mouth daily.    [provider]  hydrochlorothiazide (HYDRODIURIL) 12.5 MG tablet TAKE 1 TABLET(12.5 MG) BY MOUTH DAILY 11/16/20   Biagio Borg, MD  hydrocortisone 2.5 % cream Apply topically 2 (two) times daily. 11/09/17   Biagio Borg, MD  iron polysaccharides (NU-IRON) 150 MG capsule Take 1 capsule (150 mg total) by mouth daily. 08/30/19   Biagio Borg, MD  loratadine (CLARITIN) 10 MG tablet Take 10 mg by mouth daily as needed for allergies.    [provider]  oxyCODONE-acetaminophen (PERCOCET/ROXICET) 5-325 MG tablet Take 1 tablet by mouth every 6 (six) hours as needed for severe pain. 09/28/20   Biagio Borg, MD  Turmeric 500 MG CAPS Take 500 mg by mouth daily.    [provider]  vitamin B-12 (CYANOCOBALAMIN) 100 MCG tablet Take 100 mcg by mouth daily.    [provider]  VITAMIN D-VITAMIN K PO Take 1 tablet by mouth daily.  [provider]  zolpidem (AMBIEN) 10 MG tablet Take 1 tablet (10 mg total) by mouth at bedtime as needed for up to 30 days for sleep. 08/01/18 12/11/19  Biagio Borg, MD     Family History  Problem Relation Age of Onset   Allergies Sister    Diabetes Sister    Hypertension Sister    Hypertension Mother    Prostate cancer Father    Hypertension Brother     Hypertension Sister    Diabetes Paternal Aunt    Cancer Paternal Aunt        type unknown   Prostate cancer Brother    Stroke Maternal Aunt    Hypertension Maternal Aunt    Colon cancer Neg Hx    Esophageal cancer Neg Hx    Rectal cancer Neg Hx    Stomach cancer Neg Hx     Social History   Socioeconomic History   Marital status: Married    Spouse name: Not on file   Number of children: 3   Years of education: Not on file   Highest education level: Not on file  Occupational History   Occupation: CARD REPLACEMEN    Employer: [employer]   Occupation: customer service  Tobacco Use   Smoking status: Some Days    Packs/day: 0.50    Years: 40.00    Pack years: 20.00    Types: Cigarettes    Last attempt to quit: 10/22/2019    Years since quitting: 1.3   Smokeless tobacco: Never   Tobacco comments:    1 pack weekly.  started smoking at 60 years old  Vaping Use   Vaping Use: Never used  Substance and Sexual Activity   Alcohol use: Yes    Comment: ocassional   Drug use: No   Sexual activity: Not Currently  Other Topics Concern   Not on file  Social History Narrative   Daily caffeine use 3   Patient does not get regular exercise   Social Determinants of Health   Financial Resource Strain: Not on file  Food Insecurity: Not on file  Transportation Needs: Not on file  Physical Activity: Not on file  Stress: Not on file  Social Connections: Not on file    ECOG Status: 1 - Symptomatic but completely ambulatory  Review of Systems: A 12 point ROS discussed and pertinent positives are indicated in the HPI above.  All other systems are negative.  Review of Systems  Constitutional: Negative.   HENT: Negative.    Eyes: Negative.   Respiratory: Negative.    Cardiovascular: Negative.   Gastrointestinal:  Negative for diarrhea (minimal diarrhea).  Genitourinary: Negative.   Skin: Negative.        No flushing  Hematological: Negative.    Vital Signs: LMP  10/26/2017   Physical Exam - deferred for COVID precautions  Imaging:  Labs:  CBC: Recent Labs    11/27/20 0838 12/25/20 0928 01/07/21 2356 01/22/21 0845  WBC 9.5 7.5 11.1* 7.7  HGB 12.0 12.9 14.4 11.6*  HCT 36.5 39.5 47.8* 35.9*  PLT 274 317 281 255    COAGS: No results for input(s): INR, APTT in the last 8760 hours.  BMP: Recent Labs    03/20/20 0937 04/01/20 1004 04/17/20 1040 11/27/20 0838 12/25/20 0928 01/07/21 2356 01/22/21 0845  NA 141 141   < > 141 139 137 142  K 3.6 3.7   < > 3.7 3.8 4.1 4.0  CL 104 103   < >  104 103 98 106  CO2 29 32   < > '26 25 25 30  '$ GLUCOSE 111* 107*   < > 145* 125* 145* 130*  BUN 9 8   < > '12 13 10 13  '$ CALCIUM 9.4 9.3   < > 9.4 9.7 10.0 9.2  CREATININE 0.78 0.76   < > 0.86 0.84 0.88 0.83  GFRNONAA >60 >60   < > >60 >60 >60 >60  GFRAA >60 >60  --   --   --   --   --    < > = values in this interval not displayed.    LIVER FUNCTION TESTS: Recent Labs    11/27/20 0838 12/25/20 0928 01/08/21 0256 01/22/21 0845  BILITOT <0.2* 0.5 0.5 0.4  AST 14* 15 35 17  ALT 13 11 44 17  ALKPHOS 108 110 113 89  PROT 6.9 7.6 8.4* 7.0  ALBUMIN 3.5 3.5 4.2 3.7    TUMOR MARKERS: Recent Labs    10/02/20 1007 10/30/20 0930 11/27/20 0838 12/25/20 0928  CHROMOGA 17.2 19.4 21.1 19.9    Assessment and Plan:  [Patient is a candidate for peptide receptor radiotherapy.  Patient has multifocal  well differentiated neuroendocrine tumor identified within the skeleton with CT evidence of progression.  The tumor is intensely positive for DOTATATE/ somatostatin receptors.  Patient has minimal symptoms which may be attributable to carcinoid tumor     The patient was counseled on the primary goal of therapy which is prolongation of progression free survival (79% improvement over standard therapy).  Secondary goals would include decrease in tumor burden and decrease in carcinoid symptoms.    Primary of toxicities of therapy were explained to patient  including marrow suppression, renal toxicity and hepatic toxicity.  Rare toxicity of myelosuppression explained.  Potential toxicity will be monitored throughout the course of therapy with interval  CBC and CMP laboratory evaluation.    Four therapies would  be scheduled  2 months apart over a  six-month interval.  Patient will receive IM Lanreotde  injection in the molecular imaging department after each therapy.  Patient will return to oncology clinic 1 month following each therapy for CBC and CMP and IM Sandostatin injection.   Thank you for this interesting consult.  I greatly enjoyed meeting Michelle Solomon and look forward to participating in their care.  A copy of this report was sent to the requesting provider on this date.  Electronically Signed: Rennis Golden, MD 02/11/2021, 3:16 PM   I spent a total of  40 Minutes   in face to face in clinical consultation, greater than 50% of which was counseling/coordinating care for metastatic neuroendocrine tumor.

## 2021-02-17 ENCOUNTER — Telehealth: Payer: Self-pay | Admitting: *Deleted

## 2021-02-17 NOTE — Telephone Encounter (Signed)
Received call from pt asking if we have received a letter from her dentist clearing her of any dental issues or jaw bone issues so that pt can receive her Zometa.  Advised that we have not. Advised that I would call the dentist to ask for it again. Pt is still in the process of deciding whether to do the Lutathere injection or not. She is waiting for her f/u Video visit with Dr. Caprice Beaver , This is scheduled for next week. She is continuing her Lanreotide injections, monthly.  Call made to [facility], requesting clearance letter for her Zometa.  Our fax # was provided   [phone]

## 2021-02-19 ENCOUNTER — Inpatient Hospital Stay: Payer: 59

## 2021-02-19 ENCOUNTER — Other Ambulatory Visit: Payer: Self-pay | Admitting: *Deleted

## 2021-02-19 ENCOUNTER — Inpatient Hospital Stay: Payer: 59 | Attending: Hematology and Oncology

## 2021-02-19 ENCOUNTER — Other Ambulatory Visit: Payer: Self-pay

## 2021-02-19 VITALS — BP 137/97 | HR 88 | Temp 98.2°F | Resp 18

## 2021-02-19 DIAGNOSIS — C7B8 Other secondary neuroendocrine tumors: Secondary | ICD-10-CM

## 2021-02-19 DIAGNOSIS — Z79899 Other long term (current) drug therapy: Secondary | ICD-10-CM | POA: Insufficient documentation

## 2021-02-19 DIAGNOSIS — C7A025 Malignant carcinoid tumor of the sigmoid colon: Secondary | ICD-10-CM | POA: Insufficient documentation

## 2021-02-19 LAB — CBC WITH DIFFERENTIAL (CANCER CENTER ONLY)
Abs Immature Granulocytes: 0.04 10*3/uL (ref 0.00–0.07)
Basophils Absolute: 0 10*3/uL (ref 0.0–0.1)
Basophils Relative: 0 %
Eosinophils Absolute: 0.3 10*3/uL (ref 0.0–0.5)
Eosinophils Relative: 3 %
HCT: 38.2 % (ref 36.0–46.0)
Hemoglobin: 12.3 g/dL (ref 12.0–15.0)
Immature Granulocytes: 0 %
Lymphocytes Relative: 40 %
Lymphs Abs: 3.9 10*3/uL (ref 0.7–4.0)
MCH: 27.1 pg (ref 26.0–34.0)
MCHC: 32.2 g/dL (ref 30.0–36.0)
MCV: 84.1 fL (ref 80.0–100.0)
Monocytes Absolute: 1 10*3/uL (ref 0.1–1.0)
Monocytes Relative: 10 %
Neutro Abs: 4.6 10*3/uL (ref 1.7–7.7)
Neutrophils Relative %: 47 %
Platelet Count: 368 10*3/uL (ref 150–400)
RBC: 4.54 MIL/uL (ref 3.87–5.11)
RDW: 13.2 % (ref 11.5–15.5)
WBC Count: 9.8 10*3/uL (ref 4.0–10.5)
nRBC: 0 % (ref 0.0–0.2)

## 2021-02-19 LAB — CMP (CANCER CENTER ONLY)
ALT: 17 U/L (ref 0–44)
AST: 15 U/L (ref 15–41)
Albumin: 3.7 g/dL (ref 3.5–5.0)
Alkaline Phosphatase: 111 U/L (ref 38–126)
Anion gap: 11 (ref 5–15)
BUN: 12 mg/dL (ref 6–20)
CO2: 29 mmol/L (ref 22–32)
Calcium: 10 mg/dL (ref 8.9–10.3)
Chloride: 102 mmol/L (ref 98–111)
Creatinine: 0.88 mg/dL (ref 0.44–1.00)
GFR, Estimated: >60 mL/min (ref >60)
Glucose, Bld: 106 mg/dL — ABNORMAL HIGH (ref 70–99)
Potassium: 4.6 mmol/L (ref 3.5–5.1)
Sodium: 142 mmol/L (ref 135–145)
Total Bilirubin: 0.3 mg/dL (ref 0.3–1.2)
Total Protein: 7.6 g/dL (ref 6.5–8.1)

## 2021-02-19 MED ORDER — LANREOTIDE ACETATE 120 MG/0.5ML ~~LOC~~ SOLN
120.0000 mg | Freq: Once | SUBCUTANEOUS | Status: AC
  Administered 2021-02-19: 120 mg via SUBCUTANEOUS
  Filled 2021-02-19: qty 120

## 2021-02-19 NOTE — Patient Instructions (Signed)
Lanreotide injection What is this medication? LANREOTIDE (lan REE oh tide) is used to reduce blood levels of growth hormone in patients with a condition called acromegaly. It also works to slow or stop tumor growth in patients with neuroendocrine tumors and treat carcinoid syndrome. This medicine may be used for other purposes; ask your health care provider or pharmacist if you have questions. COMMON BRAND NAME(S): Somatuline Depot What should I tell my care team before I take this medication? They need to know if you have any of these conditions: diabetes gallbladder disease heart disease kidney disease liver disease thyroid disease an unusual or allergic reaction to lanreotide, other medicines, foods, dyes, or preservatives pregnant or trying to get pregnant breast-feeding How should I use this medication? This medicine is for injection under the skin. It is given by a health care professional in a hospital or clinic setting. Contact your pediatrician or health care professional regarding the use of this medicine in children. Special care may be needed. Overdosage: If you think you have taken too much of this medicine contact a poison control center or emergency room at once. NOTE: This medicine is only for you. Do not share this medicine with others. What if I miss a dose? It is important not to miss your dose. Call your doctor or health care professional if you are unable to keep an appointment. What may interact with this medication? This medicine may interact with the following medications: bromocriptine cyclosporine certain medicines for blood pressure, heart disease, irregular heart beat certain medicines for diabetes quinidine terfenadine This list may not describe all possible interactions. Give your health care provider a list of all the medicines, herbs, non-prescription drugs, or dietary supplements you use. Also tell them if you smoke, drink alcohol, or use illegal drugs.  Some items may interact with your medicine. What should I watch for while using this medication? Tell your doctor or healthcare professional if your symptoms do not start to get better or if they get worse. Visit your doctor or health care professional for regular checks on your progress. Your condition will be monitored carefully while you are receiving this medicine. This medicine may increase blood sugar. Ask your healthcare provider if changes in diet or medicines are needed if you have diabetes. You may need blood work done while you are taking this medicine. Women should inform their doctor if they wish to become pregnant or think they might be pregnant. There is a potential for serious side effects to an unborn child. Talk to your health care professional or pharmacist for more information. Do not breast-feed an infant while taking this medicine or for 6 months after stopping it. This medicine has caused ovarian failure in some women. This medicine may interfere with the ability to have a child. Talk with your doctor or health care professional if you are concerned about your fertility. What side effects may I notice from receiving this medication? Side effects that you should report to your doctor or health care professional as soon as possible: allergic reactions like skin rash, itching or hives, swelling of the face, lips, or tongue increased blood pressure severe stomach pain signs and symptoms of hgh blood sugar such as being more thirsty or hungry or having to urinate more than normal. You may also feel very tired or have blurry vision. signs and symptoms of low blood sugar such as feeling anxious; confusion; dizziness; increased hunger; unusually weak or tired; sweating; shakiness; cold; irritable; headache; blurred vision; fast   heartbeat; loss of consciousness unusually slow heartbeat Side effects that usually do not require medical attention (report to your doctor or health care  professional if they continue or are bothersome): constipation diarrhea dizziness headache muscle pain muscle spasms nausea pain, redness, or irritation at site where injected This list may not describe all possible side effects. Call your doctor for medical advice about side effects. You may report side effects to FDA at 1-800-FDA-1088. Where should I keep my medication? This drug is given in a hospital or clinic and will not be stored at home. NOTE: This sheet is a summary. It may not cover all possible information. If you have questions about this medicine, talk to your doctor, pharmacist, or health care provider.  2022 Elsevier/Gold Standard (2018-03-29 09:13:08)  

## 2021-02-22 ENCOUNTER — Other Ambulatory Visit: Payer: Self-pay | Admitting: Internal Medicine

## 2021-02-22 LAB — CHROMOGRANIN A: Chromogranin A (ng/mL): 22.9 ng/mL (ref 0.0–101.8)

## 2021-02-23 MED ORDER — OXYCODONE-ACETAMINOPHEN 5-325 MG PO TABS: 1.0000 | ORAL_TABLET | Freq: Four times a day (QID) | ORAL | 0 refills | Status: DC | PRN

## 2021-02-25 ENCOUNTER — Other Ambulatory Visit: Payer: Self-pay

## 2021-02-25 DIAGNOSIS — F419 Anxiety disorder, unspecified: Secondary | ICD-10-CM

## 2021-02-25 DIAGNOSIS — F32A Depression, unspecified: Secondary | ICD-10-CM

## 2021-02-25 MED ORDER — BUPROPION HCL ER (XL) 300 MG PO TB24
ORAL_TABLET | ORAL | 1 refills | Status: DC
Start: 2021-02-25 — End: 2021-09-02

## 2021-02-25 NOTE — Telephone Encounter (Signed)
Last OV on 01/14/21 & 09/28/20, depression not noted. Last noted in 08/30/19 visit.

## 2021-03-16 ENCOUNTER — Other Ambulatory Visit (HOSPITAL_COMMUNITY): Payer: Self-pay | Admitting: Hematology and Oncology

## 2021-03-16 DIAGNOSIS — C7A8 Other malignant neuroendocrine tumors: Secondary | ICD-10-CM

## 2021-03-19 ENCOUNTER — Inpatient Hospital Stay: Payer: 59 | Attending: Hematology and Oncology

## 2021-03-19 ENCOUNTER — Other Ambulatory Visit: Payer: Self-pay

## 2021-03-19 ENCOUNTER — Other Ambulatory Visit: Payer: Self-pay | Admitting: Hematology and Oncology

## 2021-03-19 ENCOUNTER — Inpatient Hospital Stay: Payer: 59

## 2021-03-19 VITALS — BP 152/101 | HR 79 | Temp 98.2°F | Resp 16

## 2021-03-19 DIAGNOSIS — Z79899 Other long term (current) drug therapy: Secondary | ICD-10-CM | POA: Diagnosis not present

## 2021-03-19 DIAGNOSIS — C7B8 Other secondary neuroendocrine tumors: Secondary | ICD-10-CM

## 2021-03-19 DIAGNOSIS — C7A025 Malignant carcinoid tumor of the sigmoid colon: Secondary | ICD-10-CM | POA: Insufficient documentation

## 2021-03-19 LAB — CMP (CANCER CENTER ONLY)
ALT: 12 U/L (ref 0–44)
AST: 13 U/L — ABNORMAL LOW (ref 15–41)
Albumin: 3.8 g/dL (ref 3.5–5.0)
Alkaline Phosphatase: 112 U/L (ref 38–126)
Anion gap: 10 (ref 5–15)
BUN: 11 mg/dL (ref 6–20)
CO2: 29 mmol/L (ref 22–32)
Calcium: 10.1 mg/dL (ref 8.9–10.3)
Chloride: 106 mmol/L (ref 98–111)
Creatinine: 0.9 mg/dL (ref 0.44–1.00)
GFR, Estimated: >60 mL/min (ref >60)
Glucose, Bld: 94 mg/dL (ref 70–99)
Potassium: 3.8 mmol/L (ref 3.5–5.1)
Sodium: 145 mmol/L (ref 135–145)
Total Bilirubin: 0.3 mg/dL (ref 0.3–1.2)
Total Protein: 7.4 g/dL (ref 6.5–8.1)

## 2021-03-19 LAB — CBC WITH DIFFERENTIAL (CANCER CENTER ONLY)
Abs Immature Granulocytes: 0.02 10*3/uL (ref 0.00–0.07)
Basophils Absolute: 0 10*3/uL (ref 0.0–0.1)
Basophils Relative: 1 %
Eosinophils Absolute: 0.2 10*3/uL (ref 0.0–0.5)
Eosinophils Relative: 2 %
HCT: 38.6 % (ref 36.0–46.0)
Hemoglobin: 12.5 g/dL (ref 12.0–15.0)
Immature Granulocytes: 0 %
Lymphocytes Relative: 46 %
Lymphs Abs: 3.8 10*3/uL (ref 0.7–4.0)
MCH: 26.9 pg (ref 26.0–34.0)
MCHC: 32.4 g/dL (ref 30.0–36.0)
MCV: 83.2 fL (ref 80.0–100.0)
Monocytes Absolute: 0.9 10*3/uL (ref 0.1–1.0)
Monocytes Relative: 11 %
Neutro Abs: 3.3 10*3/uL (ref 1.7–7.7)
Neutrophils Relative %: 40 %
Platelet Count: 278 10*3/uL (ref 150–400)
RBC: 4.64 MIL/uL (ref 3.87–5.11)
RDW: 13.8 % (ref 11.5–15.5)
WBC Count: 8.2 10*3/uL (ref 4.0–10.5)
nRBC: 0 % (ref 0.0–0.2)

## 2021-03-19 MED ORDER — LANREOTIDE ACETATE 120 MG/0.5ML ~~LOC~~ SOLN
120.0000 mg | Freq: Once | SUBCUTANEOUS | Status: AC
  Administered 2021-03-19: 120 mg via SUBCUTANEOUS
  Filled 2021-03-19: qty 120

## 2021-03-22 LAB — CHROMOGRANIN A: Chromogranin A (ng/mL): 24.1 ng/mL (ref 0.0–101.8)

## 2021-03-30 NOTE — Written Directive (Addendum)
MOLECULAR IMAGING AND THERAPEUTICS WRITTEN DIRECTIVE   PATIENT NAME: Michelle Solomon  PT DOB:   1961/03/03                                              MRN: [id number]  ---------------------------------------------------------------------------------------------------------------------  [facility]ER[facility]   RADIOPHARMACEUTICAL:  Lutetium 177 Dotatate (Lutathera)     PRESCRIBED DOSE FOR ADMINISTRATION:  200 mCi   ROUTE OFADMINISTRATION:  IV   DIAGNOSIS:  Neuroendocrine Cancer   REFERRING PHYSICIAN: Dr. Narda Rutherford   TREATMENT #: 1   DATE OF LAST LONG LIVED SOMATOSTATIN INJECTION: 03/19/2021   ADDITIONAL PHYSICIAN COMMENTS/NOTES:   AUTHORIZED USER SIGNATURE & TIME STAMP: Rennis Golden, MD   03/30/21    6:41 PM

## 2021-04-02 ENCOUNTER — Other Ambulatory Visit: Payer: Self-pay

## 2021-04-02 ENCOUNTER — Inpatient Hospital Stay (HOSPITAL_BASED_OUTPATIENT_CLINIC_OR_DEPARTMENT_OTHER): Payer: 59 | Admitting: Hematology and Oncology

## 2021-04-02 ENCOUNTER — Other Ambulatory Visit: Payer: Self-pay | Admitting: Hematology and Oncology

## 2021-04-02 ENCOUNTER — Inpatient Hospital Stay: Payer: 59 | Attending: Hematology and Oncology

## 2021-04-02 VITALS — BP 148/104 | HR 87 | Temp 97.4°F | Resp 19 | Ht 63.0 in | Wt 188.2 lb

## 2021-04-02 DIAGNOSIS — C7B8 Other secondary neuroendocrine tumors: Secondary | ICD-10-CM

## 2021-04-02 DIAGNOSIS — I10 Essential (primary) hypertension: Secondary | ICD-10-CM | POA: Insufficient documentation

## 2021-04-02 DIAGNOSIS — C7A8 Other malignant neuroendocrine tumors: Secondary | ICD-10-CM

## 2021-04-02 DIAGNOSIS — Z79899 Other long term (current) drug therapy: Secondary | ICD-10-CM | POA: Insufficient documentation

## 2021-04-02 DIAGNOSIS — C7A025 Malignant carcinoid tumor of the sigmoid colon: Secondary | ICD-10-CM | POA: Insufficient documentation

## 2021-04-02 DIAGNOSIS — J45909 Unspecified asthma, uncomplicated: Secondary | ICD-10-CM | POA: Diagnosis not present

## 2021-04-02 DIAGNOSIS — C7B03 Secondary carcinoid tumors of bone: Secondary | ICD-10-CM | POA: Insufficient documentation

## 2021-04-02 DIAGNOSIS — M899 Disorder of bone, unspecified: Secondary | ICD-10-CM | POA: Diagnosis not present

## 2021-04-02 DIAGNOSIS — Z87891 Personal history of nicotine dependence: Secondary | ICD-10-CM | POA: Insufficient documentation

## 2021-04-02 LAB — CBC WITH DIFFERENTIAL (CANCER CENTER ONLY)
Abs Immature Granulocytes: 0.03 10*3/uL (ref 0.00–0.07)
Basophils Absolute: 0 10*3/uL (ref 0.0–0.1)
Basophils Relative: 0 %
Eosinophils Absolute: 0.2 10*3/uL (ref 0.0–0.5)
Eosinophils Relative: 2 %
HCT: 35.8 % — ABNORMAL LOW (ref 36.0–46.0)
Hemoglobin: 11.7 g/dL — ABNORMAL LOW (ref 12.0–15.0)
Immature Granulocytes: 0 %
Lymphocytes Relative: 49 %
Lymphs Abs: 4.2 10*3/uL — ABNORMAL HIGH (ref 0.7–4.0)
MCH: 27 pg (ref 26.0–34.0)
MCHC: 32.7 g/dL (ref 30.0–36.0)
MCV: 82.7 fL (ref 80.0–100.0)
Monocytes Absolute: 0.7 10*3/uL (ref 0.1–1.0)
Monocytes Relative: 8 %
Neutro Abs: 3.5 10*3/uL (ref 1.7–7.7)
Neutrophils Relative %: 41 %
Platelet Count: 262 10*3/uL (ref 150–400)
RBC: 4.33 MIL/uL (ref 3.87–5.11)
RDW: 14 % (ref 11.5–15.5)
WBC Count: 8.7 10*3/uL (ref 4.0–10.5)
nRBC: 0 % (ref 0.0–0.2)

## 2021-04-02 LAB — CMP (CANCER CENTER ONLY)
ALT: 9 U/L (ref 0–44)
AST: 12 U/L — ABNORMAL LOW (ref 15–41)
Albumin: 3.7 g/dL (ref 3.5–5.0)
Alkaline Phosphatase: 106 U/L (ref 38–126)
Anion gap: 10 (ref 5–15)
BUN: 11 mg/dL (ref 6–20)
CO2: 26 mmol/L (ref 22–32)
Calcium: 9.3 mg/dL (ref 8.9–10.3)
Chloride: 104 mmol/L (ref 98–111)
Creatinine: 0.84 mg/dL (ref 0.44–1.00)
GFR, Estimated: >60 mL/min (ref >60)
Glucose, Bld: 157 mg/dL — ABNORMAL HIGH (ref 70–99)
Potassium: 3.6 mmol/L (ref 3.5–5.1)
Sodium: 140 mmol/L (ref 135–145)
Total Bilirubin: <0.2 mg/dL — ABNORMAL LOW (ref 0.3–1.2)
Total Protein: 7.1 g/dL (ref 6.5–8.1)

## 2021-04-02 NOTE — Progress Notes (Signed)
[place] [phone]   [phone]  PROGRESS NOTE  Patient Care Team: Biagio Borg, MD as PCP - General  Hematological/Oncological History # Metastatic Neuroendocrine Tumor, Metastasis to the Spine 1) 08/26/2019: CT Abdomen Pelvis performed due to RUQ abdominal pain. Imaging showed a soft tissue mass abutting the right posterior eighth rib 2) 09/06/2019: CT chest performed which showed Ill-defined soft tissue nodule along the medial right posterior thoracic wall at the 8th intercostal space to the right of the spine. MRI was recommended. 3) 10/19/2019: MRI Thoracic spine showed enhancing lesions throughout the visualized spine consistent with metastatic disease. Additionally there was a 1.5 cm soft tissue nodule in the posteromedial right eighth intercostal space, more concerning for a metastasis 4) 10/25/2019: establish care with Dr. Lorenso Courier   5) 11/05/2019: attempted biopsy of soft tissue mass at right posterior 8th rib, but lesion had dissipated at time of biopsy attempt 6) 11/27/2019: PET CT scan performed showing right inguinal lymph nodes and osseous lesions, indicative of metastatic disease of unknown primary. 7) 12/11/2019: CT bone marrow biopsy showed metastatic neuroendocrine neoplasm 8) 12/27/2019: start of lanreotide 161m sub q28 days  9) 10/15/2020: NM PET (CU-64) scan showed widespread osseous and lymphatic uptake consistent with metastatic spread of neuroendocrine tumor.  This is more avid than prior. 01/20/2021: clinic visit with Dr. MCaprice Beaverdue to concern for possible mantle cell lymphoma seen on pathology review of previous biopsy   #Low Grade Neuroendocrine Tumor 1) 2006: reportedly had rectal carcinoid tumor removed 2) 08/14/2008: patient had a flexibile sigmoidoscopy with endoscopic ultrasound which resected an 828m subepithelial lesion in rectum. Findings consistent with low grade neuroendocrine tumor. 3) 04/08/2010: repeat colonoscopy, no  residual tumor. Repeat recommended in 2016.  4) 12/11/2019: metastatic recurrence found on biopsy, noted above.   #Mantle Cell Lymphoma 11/24/2020: incidental findings of low level mantle cell lymphoma on bone marrow biopsy. [facility] 01/20/2021: clinic visit with Dr. McCaprice Beaverue to concern for possible mantle cell lymphoma seen on pathology review of previous biopsy   Interval History:  Michelle CaRhude60 l.o. female with medical history significant for metastatic neuroendocrine tumor of the colon who presents for a follow up visit. The patient's last visit was on 01/22/2021 to assure continued tolerance of lanreotide therapy. In the interim since the last visit the patient has decided she would like to pursue Lutathera therapy.  On exam today Michelle Solomon she has been well in the interim since her last visit.  She notes that she has not been having any new symptoms such as bone pain, back pain, or overt signs of GI bleeding.  She notes she is at her baseline level of health and tolerating the shots quite well.  Her last shot was 2 weeks ago.  Otherwise at baseline she does not have any GI upset.  Her weight has been stable and she has had no fevers, chills, sweats, nausea, vomiting or diarrhea.  She notes that she is tolerating the shots well without any nausea, vomiting, or diarrhea.  She does not have any other questions or concerns today.  A full 10 point ROS is listed below.  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   Cancer [facility])    Neuro endocrine tumor   CONJUNCTIVITIS, ALLERGIC 11/27/2008   HEMORRHOIDS 06/16/2008   HTN (hypertension)    Hx of adenomatous colonic  polyps 11/2019   NIPPLE DISCHARGE 01/21/2010   Other specified forms of hearing loss 10/08/2009   Overweight(278.02) 05/29/2007   RASH-NONVESICULAR 05/29/2007   Wheezing 06/22/2010    SURGICAL HISTORY: Past Surgical History:  Procedure Laterality Date   COLONOSCOPY      SOCIAL HISTORY: Social History   Socioeconomic History   Marital status: Married    Spouse name: Not on file   Number of children: 3   Years of education: Not on file   Highest education level: Not on file  Occupational History   Occupation: [employer]    Employer: [employer]   Occupation: customer service  Tobacco Use   Smoking status: Some Days    Packs/day: 0.50    Years: 40.00    Pack years: 20.00    Types: Cigarettes    Last attempt to quit: 10/22/2019    Years since quitting: 1.4   Smokeless tobacco: Never   Tobacco comments:    1 pack weekly.  started smoking at 60 years old  Vaping Use   Vaping Use: Never used  Substance and Sexual Activity   Alcohol use: Yes    Comment: ocassional   Drug use: No   Sexual activity: Not Currently  Other Topics Concern   Not on file  Social History Narrative   Daily caffeine use 3   Patient does not get regular exercise   Social Determinants of Health   Financial Resource Strain: Not on file  Food Insecurity: Not on file  Transportation Needs: Not on file  Physical Activity: Not on file  Stress: Not on file  Social Connections: Not on file  Intimate Partner Violence: Not on file    FAMILY HISTORY: Family History  Problem Relation Age of Onset   Allergies Sister    Diabetes Sister    Hypertension Sister    Hypertension Mother    Prostate cancer Father    Hypertension Brother    Hypertension Sister    Diabetes Paternal Aunt    Cancer Paternal Aunt        type unknown   Prostate cancer Brother    Stroke Maternal Aunt    Hypertension Maternal Aunt    Colon cancer Neg Hx    Esophageal cancer Neg Hx    Rectal cancer Neg Hx     Stomach cancer Neg Hx     ALLERGIES:  is allergic to shellfish allergy.  MEDICATIONS:  Current Outpatient Medications  Medication Sig Dispense Refill   ALPRAZolam (XANAX) 0.5 MG tablet Take 1 tablet (0.5 mg total) by mouth 2 (two) times daily as needed for anxiety or sleep. 60 tablet 2   buPROPion (WELLBUTRIN XL) 300 MG 24 hr tablet TAKE 1 TABLET(300 MG) BY MOUTH DAILY 90 tablet 1   cholecalciferol (VITAMIN D3) 25 MCG (1000 UNIT) tablet Take 1,000 Units by mouth daily.     escitalopram (LEXAPRO) 20 MG tablet Take 20 mg by mouth daily.     hydrochlorothiazide (HYDRODIURIL) 12.5 MG tablet TAKE 1 TABLET(12.5 MG) BY MOUTH DAILY 90 tablet 3   hydrocortisone 2.5 % cream Apply topically 2 (two) times daily. 30 g 1   iron polysaccharides (NU-IRON) 150 MG capsule Take 1 capsule (150 mg total) by mouth daily. 90 capsule 1   loratadine (  CLARITIN) 10 MG tablet Take 10 mg by mouth daily as needed for allergies.     oxyCODONE-acetaminophen (PERCOCET/ROXICET) 5-325 MG tablet Take 1 tablet by mouth every 6 (six) hours as needed for severe pain. 30 tablet 0   Turmeric 500 MG CAPS Take 500 mg by mouth daily.     vitamin B-12 (CYANOCOBALAMIN) 100 MCG tablet Take 100 mcg by mouth daily.     VITAMIN D-VITAMIN K PO Take 1 tablet by mouth daily.     zolpidem (AMBIEN) 10 MG tablet Take 1 tablet (10 mg total) by mouth at bedtime as needed for up to 30 days for sleep. 30 tablet 5   No current facility-administered medications for this visit.    REVIEW OF SYSTEMS:   Constitutional: ( - ) fevers, ( - )  chills , ( - ) night sweats Eyes: ( - ) blurriness of vision, ( - ) double vision, ( - ) watery eyes Ears, nose, mouth, throat, and face: ( - ) mucositis, ( - ) sore throat Respiratory: ( - ) cough, ( - ) dyspnea, ( - ) wheezes Cardiovascular: ( - ) palpitation, ( - ) chest discomfort, ( - ) lower extremity swelling Gastrointestinal:  ( - ) nausea, ( - ) heartburn, ( - ) change in bowel habits Skin: ( - )  abnormal skin rashes Lymphatics: ( - ) new lymphadenopathy, ( - ) easy bruising Neurological: ( - ) numbness, ( - ) tingling, ( - ) new weaknesses Behavioral/Psych: ( - ) mood change, ( - ) new changes  All other systems were reviewed with the patient and are negative.  PHYSICAL EXAMINATION: ECOG PERFORMANCE STATUS: 0 - Asymptomatic  Vitals:   04/02/21 1033  BP: (!) 148/104  Pulse: 87  Resp: 19  Temp: (!) 97.4 F (36.3 C)  SpO2: 98%   Filed Weights   04/02/21 1033  Weight: 188 lb 3.2 oz (85.4 kg)    GENERAL: well appearing middle aged [place] American female in NAD  SKIN: skin color, texture, turgor are normal, no rashes or significant lesions EYES: conjunctiva are pink and non-injected, sclera clear LUNGS: clear to auscultation and percussion with normal breathing effort HEART: regular rate & rhythm and no murmurs and no lower extremity edema Musculoskeletal: no cyanosis of digits and no clubbing  PSYCH: alert & oriented x 3, fluent speech NEURO: no focal motor/sensory deficits  LABORATORY DATA:  I have reviewed the data as listed CBC Latest Ref Rng & Units 04/02/2021 03/19/2021 02/19/2021  WBC 4.0 - 10.5 K/uL 8.7 8.2 9.8  Hemoglobin 12.0 - 15.0 g/dL 11.7(L) 12.5 12.3  Hematocrit 36.0 - 46.0 % 35.8(L) 38.6 38.2  Platelets 150 - 400 K/uL 262 278 368    CMP Latest Ref Rng & Units 04/02/2021 03/19/2021 02/19/2021  Glucose 70 - 99 mg/dL 157(H) 94 106(H)  BUN 6 - 20 mg/dL 11 11 12   Creatinine 0.44 - 1.00 mg/dL 0.84 0.90 0.88  Sodium 135 - 145 mmol/L 140 145 142  Potassium 3.5 - 5.1 mmol/L 3.6 3.8 4.6  Chloride 98 - 111 mmol/L 104 106 102  CO2 22 - 32 mmol/L 26 29 29   Calcium 8.9 - 10.3 mg/dL 9.3 10.1 10.0  Total Protein 6.5 - 8.1 g/dL 7.1 7.4 7.6  Total Bilirubin 0.3 - 1.2 mg/dL <0.2(L) 0.3 0.3  Alkaline Phos 38 - 126 U/L 106 112 111  AST 15 - 41 U/L 12(L) 13(L) 15  ALT 0 - 44 U/L 9 12 17  RADIOGRAPHIC STUDIES: I have personally reviewed the radiological images as  listed and agreed with the findings in the report.  No results found.   ASSESSMENT & PLAN Michelle Solomon 60 y.o. female with medical history significant for metastatic neuroendocrine tumor of the colon who presents for a follow up visit.  After review of the labs, review the pathology, and review the PET CT imaging the findings are most consistent with metastatic neuroendocrine tumor with metastasis to the spine.  The pathology currently appears to be consistent with the neuroendocrine tumor which was previously resected from the patient's rectum.  On exam today Michelle Solomon notes she is tolerating treatment well and ready and able to proceed with continued lanreotide therapy.  Based on the results of her prior imaging I have offered to her the option for Lu 177 Dotatate treatment.  She met with the nuclear medicine physicians in order to discuss this treatment.  She is also had a second opinion with Dr. Leamon Arnt at [facility].   Previously we discussed the nature of metastatic neuroendocrine tumor and the treatment options moving forward.  Fortunately this is a slow-growing tumor and observation would be an appropriate treatment option.  I noted that we could do serial imaging to monitor the progression of the tumor and determine if treatment was required.  If the patient wanted to proceed with treatment we could consider lanreotide or octreotide subcu q. 28 days for control of the disease.  I noted that the side effects that could be expected would be nausea, vomiting, diarrhea, abdominal pain, and injection site reactions.  The patient noted that she would prefer to proceed with treatment.  We will plan to continue lanreotide 120 mg subcu q. 28 days with interval imaging approximately 3 months from the start of treatment.  The patient voiced her understanding of this plan and was eager to move forward.  # Metastatic Neuroendocrine Tumor, Metastasis to the Spine  --previously discussed with  patient that the options would include observation with serial imaging or starting lanreotide therapy. The patient opted to start treatment --will plan for continued lanreotide 163m subq q28 days. Started therapy on 12/27/2019. This will be continued until progression or intolerance to therapy. Next dose due on 02/19/2021 --Due to the extensive involvement of her skeleton and increased activity on her last nuclear medicine scan it was recommended that she be considered for Lu 177 dotatate treatment.  I do believe this is a reasonable option for her.  We have made the referral to nuclear medicine.   --CU-64 scan q 3 months. Last in April 2022. [facility] no evidence of lymphoma.  --Patient expressed interest in a second opinion.  [facility]  He provided excellent advice regarding options moving forward.  After hearing all the different options the patient noted she would like to continue her current lanreotide shots with consideration of increased frequency if she were to be found to be progressing Plan:  --patient wishes to proceed with Lutathera therapy. Appreciate assistance of [facility]. They will administer lanreotide on days where she undergoes Lutathera treatment (q 8 weeks x 4 doses) --currently working toward dental clearance for zometa. Start zometa once clear.  --RTC in 6 weeks  for interval visit.    #Mantle Cell Lymphoma -- Incidental finding from prior biopsy results.  [facility] --  Unclear if this represents a small low-grade clinically insignificant population of mantle cell lymphoma or a more concerning issue --Patient following with Dr. Caprice Beaver at [facility] --PET CT scan on 02/02/2021 showed no evidence of lymphoma.  --We will continue to monitor and appreciate the guidance of Dr. Caprice Beaver  No  orders of the defined types were placed in this encounter.  All questions were answered. The patient knows to call the clinic with any problems, questions or concerns.  A total of more than 30 minutes were spent on this encounter and over half of that time was spent on counseling and coordination of care as outlined above.   [name], MD Department of Hematology/Oncology [place] at [facility] Phone: [phone] Pager: [phone] Email: [email or web address]  04/02/2021 2:54 PM

## 2021-04-05 LAB — CHROMOGRANIN A: Chromogranin A (ng/mL): 18.8 ng/mL (ref 0.0–101.8)

## 2021-04-15 ENCOUNTER — Other Ambulatory Visit: Payer: Self-pay

## 2021-04-15 ENCOUNTER — Telehealth: Payer: Self-pay

## 2021-04-15 ENCOUNTER — Ambulatory Visit (HOSPITAL_COMMUNITY)
Admission: RE | Admit: 2021-04-15 | Discharge: 2021-04-15 | Disposition: A | Discharge status: Home / Self Care | Payer: 59 | Source: Ambulatory Visit | Attending: Hematology and Oncology | Admitting: Hematology and Oncology

## 2021-04-15 DIAGNOSIS — C7A8 Other malignant neuroendocrine tumors: Secondary | ICD-10-CM | POA: Diagnosis not present

## 2021-04-15 LAB — CBC WITH DIFFERENTIAL/PLATELET
Abs Immature Granulocytes: 0.02 10*3/uL (ref 0.00–0.07)
Basophils Absolute: 0 10*3/uL (ref 0.0–0.1)
Basophils Relative: 0 %
Eosinophils Absolute: 0.2 10*3/uL (ref 0.0–0.5)
Eosinophils Relative: 2 %
HCT: 37.1 % (ref 36.0–46.0)
Hemoglobin: 12 g/dL (ref 12.0–15.0)
Immature Granulocytes: 0 %
Lymphocytes Relative: 43 %
Lymphs Abs: 3.3 10*3/uL (ref 0.7–4.0)
MCH: 27.6 pg (ref 26.0–34.0)
MCHC: 32.3 g/dL (ref 30.0–36.0)
MCV: 85.3 fL (ref 80.0–100.0)
Monocytes Absolute: 0.7 10*3/uL (ref 0.1–1.0)
Monocytes Relative: 9 %
Neutro Abs: 3.4 10*3/uL (ref 1.7–7.7)
Neutrophils Relative %: 46 %
Platelets: 233 10*3/uL (ref 150–400)
RBC: 4.35 MIL/uL (ref 3.87–5.11)
RDW: 14.2 % (ref 11.5–15.5)
WBC: 7.5 10*3/uL (ref 4.0–10.5)
nRBC: 0 % (ref 0.0–0.2)

## 2021-04-15 LAB — COMPREHENSIVE METABOLIC PANEL
ALT: 13 U/L (ref 0–44)
AST: 16 U/L (ref 15–41)
Albumin: 3.7 g/dL (ref 3.5–5.0)
Alkaline Phosphatase: 84 U/L (ref 38–126)
Anion gap: 7 (ref 5–15)
BUN: 22 mg/dL — ABNORMAL HIGH (ref 6–20)
CO2: 25 mmol/L (ref 22–32)
Calcium: 8.9 mg/dL (ref 8.9–10.3)
Chloride: 105 mmol/L (ref 98–111)
Creatinine, Ser: 0.88 mg/dL (ref 0.44–1.00)
GFR, Estimated: >60 mL/min (ref >60)
Glucose, Bld: 126 mg/dL — ABNORMAL HIGH (ref 70–99)
Potassium: 3.3 mmol/L — ABNORMAL LOW (ref 3.5–5.1)
Sodium: 137 mmol/L (ref 135–145)
Total Bilirubin: 0.3 mg/dL (ref 0.3–1.2)
Total Protein: 6.9 g/dL (ref 6.5–8.1)

## 2021-04-15 MED ORDER — OCTREOTIDE ACETATE 30 MG IM KIT: 30.0000 mg | PACK | Freq: Once | INTRAMUSCULAR | Status: DC

## 2021-04-15 MED ORDER — SODIUM CHLORIDE 0.9 % IV SOLN
500.0000 mL | Freq: Once | INTRAVENOUS | Status: AC
  Administered 2021-04-15: 500 mL via INTRAVENOUS

## 2021-04-15 MED ORDER — PROCHLORPERAZINE EDISYLATE 10 MG/2ML IJ SOLN: 10.0000 mg | Freq: Four times a day (QID) | INTRAMUSCULAR | Status: DC | PRN

## 2021-04-15 MED ORDER — LANREOTIDE ACETATE 120 MG/0.5ML ~~LOC~~ SOLN
SUBCUTANEOUS | Status: AC
  Administered 2021-04-15: 120 mg
  Filled 2021-04-15: qty 120

## 2021-04-15 MED ORDER — AMINO ACID RADIOPROTECTANT - L-LYSINE 2.5%/L-ARGININE 2.5% IN NS
250.0000 mL/h | INTRAVENOUS | Status: AC
  Administered 2021-04-15: 250 mL/h via INTRAVENOUS
  Filled 2021-04-15: qty 1000

## 2021-04-15 MED ORDER — SODIUM CHLORIDE 0.9 % IV SOLN
8.0000 mg | Freq: Once | INTRAVENOUS | Status: AC
  Administered 2021-04-15: 8 mg via INTRAVENOUS
  Filled 2021-04-15: qty 4

## 2021-04-15 MED ORDER — OCTREOTIDE ACETATE 500 MCG/ML IJ SOLN: 500.0000 ug | Freq: Once | INTRAMUSCULAR | Status: DC | PRN

## 2021-04-15 MED ORDER — ONDANSETRON HCL 8 MG PO TABS: 8.0000 mg | ORAL_TABLET | Freq: Two times a day (BID) | ORAL | 0 refills | Status: DC | PRN

## 2021-04-15 MED ORDER — LUTETIUM LU 177 DOTATATE 370 MBQ/ML IV SOLN
200.0000 | Freq: Once | INTRAVENOUS | Status: AC
  Administered 2021-04-15: 200.9 via INTRAVENOUS

## 2021-04-15 NOTE — Telephone Encounter (Signed)
Notified Patient of completion of Critical Illness Form. Copy of form placed at Registration Desk for pick-up as requested. Patient states that she will print the pathology report from [place] and send it with the claim form

## 2021-04-16 ENCOUNTER — Ambulatory Visit: Payer: 59 | Admitting: Hematology and Oncology

## 2021-04-16 ENCOUNTER — Ambulatory Visit: Payer: 59

## 2021-04-16 ENCOUNTER — Other Ambulatory Visit: Payer: 59

## 2021-04-19 ENCOUNTER — Telehealth: Payer: Self-pay

## 2021-04-19 NOTE — Telephone Encounter (Signed)
Notified Patient of completion of Short Term Disability Forms. Fax Transmission Confirmation received from the [employer]. Copy mailed to Patient as requested

## 2021-04-28 ENCOUNTER — Telehealth: Payer: Self-pay | Admitting: *Deleted

## 2021-04-28 NOTE — Telephone Encounter (Signed)
Confirmed receipt of [employer] "RTW" form.  Completed awaiting provider signature.

## 2021-04-28 NOTE — Telephone Encounter (Signed)
"  Michelle Solomon [phone]).  Form e-mailed to Starla Link has not been received by my employer.  What is the status?"  Advised form signed and returned to [place] by fax last evening.  Denies Employer HR fax number.  "Spoke with [employer] and what was received did not include the Return to work form."  Provided [email or web address] to forward form previously e-mailed yet "Undeliverable" x3.   Unable to attach to Mychart patient portal. 0945 "I'll have [employer] fax form to [phone] and call you back."

## 2021-04-28 NOTE — Telephone Encounter (Signed)
Connected with Michelle Solomon [employer]; update provided of successful transfer notice of form faxed to [employer].  "Will come now to pick up forms yet could you also e-mail forms to me?"  Confirmed [email or web address]; e-mailed and envelope to entry registation desk.

## 2021-05-14 ENCOUNTER — Encounter: Payer: Self-pay | Admitting: Hematology and Oncology

## 2021-05-14 ENCOUNTER — Inpatient Hospital Stay: Payer: 59

## 2021-05-14 ENCOUNTER — Other Ambulatory Visit: Payer: Self-pay | Admitting: Hematology and Oncology

## 2021-05-14 ENCOUNTER — Inpatient Hospital Stay (HOSPITAL_BASED_OUTPATIENT_CLINIC_OR_DEPARTMENT_OTHER): Payer: 59 | Admitting: Hematology and Oncology

## 2021-05-14 ENCOUNTER — Inpatient Hospital Stay: Payer: 59 | Attending: Hematology and Oncology

## 2021-05-14 ENCOUNTER — Other Ambulatory Visit: Payer: Self-pay

## 2021-05-14 VITALS — BP 140/103 | HR 91 | Temp 97.6°F | Resp 18 | Wt 188.4 lb

## 2021-05-14 DIAGNOSIS — M899 Disorder of bone, unspecified: Secondary | ICD-10-CM

## 2021-05-14 DIAGNOSIS — Z79899 Other long term (current) drug therapy: Secondary | ICD-10-CM | POA: Diagnosis not present

## 2021-05-14 DIAGNOSIS — C7B8 Other secondary neuroendocrine tumors: Secondary | ICD-10-CM | POA: Diagnosis not present

## 2021-05-14 DIAGNOSIS — C7A8 Other malignant neuroendocrine tumors: Secondary | ICD-10-CM | POA: Diagnosis not present

## 2021-05-14 DIAGNOSIS — C7B03 Secondary carcinoid tumors of bone: Secondary | ICD-10-CM | POA: Insufficient documentation

## 2021-05-14 DIAGNOSIS — C7A025 Malignant carcinoid tumor of the sigmoid colon: Secondary | ICD-10-CM | POA: Diagnosis not present

## 2021-05-14 LAB — CMP (CANCER CENTER ONLY)
ALT: 15 U/L (ref 0–44)
AST: 14 U/L — ABNORMAL LOW (ref 15–41)
Albumin: 3.8 g/dL (ref 3.5–5.0)
Alkaline Phosphatase: 106 U/L (ref 38–126)
Anion gap: 9 (ref 5–15)
BUN: 17 mg/dL (ref 6–20)
CO2: 27 mmol/L (ref 22–32)
Calcium: 9.1 mg/dL (ref 8.9–10.3)
Chloride: 105 mmol/L (ref 98–111)
Creatinine: 0.84 mg/dL (ref 0.44–1.00)
GFR, Estimated: >60 mL/min (ref >60)
Glucose, Bld: 121 mg/dL — ABNORMAL HIGH (ref 70–99)
Potassium: 3.8 mmol/L (ref 3.5–5.1)
Sodium: 141 mmol/L (ref 135–145)
Total Bilirubin: 0.4 mg/dL (ref 0.3–1.2)
Total Protein: 7.3 g/dL (ref 6.5–8.1)

## 2021-05-14 LAB — CBC WITH DIFFERENTIAL (CANCER CENTER ONLY)
Abs Immature Granulocytes: 0.02 10*3/uL (ref 0.00–0.07)
Basophils Absolute: 0 10*3/uL (ref 0.0–0.1)
Basophils Relative: 0 %
Eosinophils Absolute: 0.2 10*3/uL (ref 0.0–0.5)
Eosinophils Relative: 2 %
HCT: 36.8 % (ref 36.0–46.0)
Hemoglobin: 11.9 g/dL — ABNORMAL LOW (ref 12.0–15.0)
Immature Granulocytes: 0 %
Lymphocytes Relative: 32 %
Lymphs Abs: 2.2 10*3/uL (ref 0.7–4.0)
MCH: 26.9 pg (ref 26.0–34.0)
MCHC: 32.3 g/dL (ref 30.0–36.0)
MCV: 83.3 fL (ref 80.0–100.0)
Monocytes Absolute: 0.7 10*3/uL (ref 0.1–1.0)
Monocytes Relative: 10 %
Neutro Abs: 3.7 10*3/uL (ref 1.7–7.7)
Neutrophils Relative %: 56 %
Platelet Count: 218 10*3/uL (ref 150–400)
RBC: 4.42 MIL/uL (ref 3.87–5.11)
RDW: 14.4 % (ref 11.5–15.5)
WBC Count: 6.9 10*3/uL (ref 4.0–10.5)
nRBC: 0 % (ref 0.0–0.2)

## 2021-05-14 MED ORDER — LANREOTIDE ACETATE 120 MG/0.5ML ~~LOC~~ SOLN
120.0000 mg | Freq: Once | SUBCUTANEOUS | Status: AC
  Administered 2021-05-14: 120 mg via SUBCUTANEOUS
  Filled 2021-05-14: qty 120

## 2021-05-14 NOTE — Progress Notes (Signed)
[place] [phone]   [phone]  PROGRESS NOTE  Patient Care Team: Biagio Borg, MD as PCP - General  Hematological/Oncological History # Metastatic Neuroendocrine Tumor, Metastasis to the Spine 1) 08/26/2019: CT Abdomen Pelvis performed due to RUQ abdominal pain. Imaging showed a soft tissue mass abutting the right posterior eighth rib 2) 09/06/2019: CT chest performed which showed Ill-defined soft tissue nodule along the medial right posterior thoracic wall at the 8th intercostal space to the right of the spine. MRI was recommended. 3) 10/19/2019: MRI Thoracic spine showed enhancing lesions throughout the visualized spine consistent with metastatic disease. Additionally there was a 1.5 cm soft tissue nodule in the posteromedial right eighth intercostal space, more concerning for a metastasis 4) 10/25/2019: establish care with Dr. Lorenso Courier   5) 11/05/2019: attempted biopsy of soft tissue mass at right posterior 8th rib, but lesion had dissipated at time of biopsy attempt 6) 11/27/2019: PET CT scan performed showing right inguinal lymph nodes and osseous lesions, indicative of metastatic disease of unknown primary. 7) 12/11/2019: CT bone marrow biopsy showed metastatic neuroendocrine neoplasm 8) 12/27/2019: start of lanreotide 118m sub q28 days  9) 10/15/2020: NM PET (CU-64) scan showed widespread osseous and lymphatic uptake consistent with metastatic spread of neuroendocrine tumor.  This is more avid than prior. 01/20/2021: clinic visit with Dr. MCaprice Beaverdue to concern for possible mantle cell lymphoma seen on pathology review of previous biopsy 04/15/2021: Dose 1 (of 4) of lutathera treatment with nuclear medicine.    #Low Grade Neuroendocrine Tumor 1) 2006: reportedly had rectal carcinoid tumor removed 2) 08/14/2008: patient had a flexibile sigmoidoscopy with endoscopic ultrasound which resected an 852m subepithelial lesion in rectum. Findings consistent with  low grade neuroendocrine tumor. 3) 04/08/2010: repeat colonoscopy, no residual tumor. Repeat recommended in 2016.  4) 12/11/2019: metastatic recurrence found on biopsy, noted above.   #Mantle Cell Lymphoma 11/24/2020: incidental findings of low level mantle cell lymphoma on bone marrow biopsy. [facility] 01/20/2021: clinic visit with Dr. McCaprice Beaverue to concern for possible mantle cell lymphoma seen on pathology review of previous biopsy  Interval History:  Michelle [name] female with medical history significant for metastatic neuroendocrine tumor of the colon who presents for a follow up visit. The patient's last visit was on 04/02/2021. In the interim since the last visit she underwent her first dose of Lutathera therapy.  On exam today Ms. CaRundquisteports she tolerated her first treatment with Lutathera quite well with no major side effects.  She notes she did have some mild abdominal pain and a little bit of loose stools with her first treatment.  She also notes she had some fatigue but denies any frank nausea or vomiting.  She notes that she had a little bit of queasiness that night for which she took one of her as needed medications 1 time.  She does continue to have some bone pain including left shoulder pain but has been stable from prior.  She notes that she is tolerating the shots well without any nausea, vomiting, or diarrhea.  She does not have any other questions or concerns today.  A full 10 point ROS is listed below.  MEDICAL HISTORY:  Past Medical History:  Diagnosis Date   Acute bronchitis 06/06/2010   ALLERGIC RHINITIS 05/29/2007   Allergy    seasonal   Anal or rectal pain 06/23/2008   ANEMIA-IRON DEFICIENCY 05/29/2007   Anxiety  ASTHMA 05/29/2007   Asthma    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   Cancer ([place])    Neuro endocrine tumor   CONJUNCTIVITIS, ALLERGIC 11/27/2008   HEMORRHOIDS 06/16/2008   HTN  (hypertension)    Hx of adenomatous colonic polyps 11/2019   NIPPLE DISCHARGE 01/21/2010   Other specified forms of hearing loss 10/08/2009   Overweight(278.02) 05/29/2007   RASH-NONVESICULAR 05/29/2007   Wheezing 06/22/2010    SURGICAL HISTORY: Past Surgical History:  Procedure Laterality Date   COLONOSCOPY      SOCIAL HISTORY: Social History   Socioeconomic History   Marital status: Married    Spouse name: Not on file   Number of children: 3   Years of education: Not on file   Highest education level: Not on file  Occupational History   Occupation: [employer]    Employer: [employer]   Occupation: customer service  Tobacco Use   Smoking status: Some Days    Packs/day: 0.50    Years: 40.00    Pack years: 20.00    Types: Cigarettes    Last attempt to quit: 10/22/2019    Years since quitting: 1.5   Smokeless tobacco: Never   Tobacco comments:    1 pack weekly.  started smoking at 60 years old  Vaping Use   Vaping Use: Never used  Substance and Sexual Activity   Alcohol use: Yes    Comment: ocassional   Drug use: No   Sexual activity: Not Currently  Other Topics Concern   Not on file  Social History Narrative   Daily caffeine use 3   Patient does not get regular exercise   Social Determinants of Health   Financial Resource Strain: Not on file  Food Insecurity: Not on file  Transportation Needs: Not on file  Physical Activity: Not on file  Stress: Not on file  Social Connections: Not on file  Intimate Partner Violence: Not on file    FAMILY HISTORY: Family History  Problem Relation Age of Onset   Allergies Sister    Diabetes Sister    Hypertension Sister    Hypertension Mother    Prostate cancer Father    Hypertension Brother    Hypertension Sister    Diabetes Paternal Aunt    Cancer Paternal Aunt        type unknown   Prostate cancer Brother    Stroke Maternal Aunt    Hypertension Maternal Aunt    Colon cancer Neg Hx    Esophageal  cancer Neg Hx    Rectal cancer Neg Hx    Stomach cancer Neg Hx     ALLERGIES:  is allergic to shellfish allergy.  MEDICATIONS:  Current Outpatient Medications  Medication Sig Dispense Refill   ALPRAZolam (XANAX) 0.5 MG tablet Take 1 tablet (0.5 mg total) by mouth 2 (two) times daily as needed for anxiety or sleep. 60 tablet 2   buPROPion (WELLBUTRIN XL) 300 MG 24 hr tablet TAKE 1 TABLET(300 MG) BY MOUTH DAILY 90 tablet 1   cholecalciferol (VITAMIN D3) 25 MCG (1000 UNIT) tablet Take 1,000 Units by mouth daily.     escitalopram (LEXAPRO) 20 MG tablet Take 20 mg by mouth daily.     hydrochlorothiazide (HYDRODIURIL) 12.5 MG tablet TAKE 1 TABLET(12.5 MG) BY MOUTH DAILY 90 tablet 3   hydrocortisone 2.5 % cream Apply topically 2 (two) times daily. 30 g 1   iron polysaccharides (NU-IRON) 150 MG capsule Take 1 capsule (150 mg total) by  mouth daily. 90 capsule 1   loratadine (CLARITIN) 10 MG tablet Take 10 mg by mouth daily as needed for allergies.     ondansetron (ZOFRAN) 8 MG tablet Take 1 tablet (8 mg total) by mouth 2 (two) times daily as needed for nausea or vomiting. 20 tablet 0   oxyCODONE-acetaminophen (PERCOCET/ROXICET) 5-325 MG tablet Take 1 tablet by mouth every 6 (six) hours as needed for severe pain. 30 tablet 0   Turmeric 500 MG CAPS Take 500 mg by mouth daily.     vitamin B-12 (CYANOCOBALAMIN) 100 MCG tablet Take 100 mcg by mouth daily.     VITAMIN D-VITAMIN K PO Take 1 tablet by mouth daily.     zolpidem (AMBIEN) 10 MG tablet Take 1 tablet (10 mg total) by mouth at bedtime as needed for up to 30 days for sleep. 30 tablet 5   No current facility-administered medications for this visit.    REVIEW OF SYSTEMS:   Constitutional: ( - ) fevers, ( - )  chills , ( - ) night sweats Eyes: ( - ) blurriness of vision, ( - ) double vision, ( - ) watery eyes Ears, nose, mouth, throat, and face: ( - ) mucositis, ( - ) sore throat Respiratory: ( - ) cough, ( - ) dyspnea, ( - )  wheezes Cardiovascular: ( - ) palpitation, ( - ) chest discomfort, ( - ) lower extremity swelling Gastrointestinal:  ( - ) nausea, ( - ) heartburn, ( - ) change in bowel habits Skin: ( - ) abnormal skin rashes Lymphatics: ( - ) new lymphadenopathy, ( - ) easy bruising Neurological: ( - ) numbness, ( - ) tingling, ( - ) new weaknesses Behavioral/Psych: ( - ) mood change, ( - ) new changes  All other systems were reviewed with the patient and are negative.  PHYSICAL EXAMINATION: ECOG PERFORMANCE STATUS: 0 - Asymptomatic  There were no vitals filed for this visit.  There were no vitals filed for this visit.   GENERAL: well appearing middle aged African American female in NAD  SKIN: skin color, texture, turgor are normal, no rashes or significant lesions EYES: conjunctiva are pink and non-injected, sclera clear LUNGS: clear to auscultation and percussion with normal breathing effort HEART: regular rate & rhythm and no murmurs and no lower extremity edema Musculoskeletal: no cyanosis of digits and no clubbing  PSYCH: alert & oriented x 3, fluent speech NEURO: no focal motor/sensory deficits  LABORATORY DATA:  I have reviewed the data as listed CBC Latest Ref Rng & Units 05/14/2021 04/15/2021 04/02/2021  WBC 4.0 - 10.5 K/uL 6.9 7.5 8.7  Hemoglobin 12.0 - 15.0 g/dL 11.9(L) 12.0 11.7(L)  Hematocrit 36.0 - 46.0 % 36.8 37.1 35.8(L)  Platelets 150 - 400 K/uL 218 233 262    CMP Latest Ref Rng & Units 05/14/2021 04/15/2021 04/02/2021  Glucose 70 - 99 mg/dL 121(H) 126(H) 157(H)  BUN 6 - 20 mg/dL 17 22(H) 11  Creatinine 0.44 - 1.00 mg/dL 0.84 0.88 0.84  Sodium 135 - 145 mmol/L 141 137 140  Potassium 3.5 - 5.1 mmol/L 3.8 3.3(L) 3.6  Chloride 98 - 111 mmol/L 105 105 104  CO2 22 - 32 mmol/L 27 25 26   Calcium 8.9 - 10.3 mg/dL 9.1 8.9 9.3  Total Protein 6.5 - 8.1 g/dL 7.3 6.9 7.1  Total Bilirubin 0.3 - 1.2 mg/dL 0.4 0.3 <0.2(L)  Alkaline Phos 38 - 126 U/L 106 84 106  AST 15 - 41 U/L 14(L) 16  12(L)  ALT 0 - 44 U/L 15 13 9     RADIOGRAPHIC STUDIES: I have personally reviewed the radiological images as listed and agreed with the findings in the report.  NM LUTATHERA ADMINISTRATION  Result Date: 04/15/2021 CLINICAL DATA:  60 year-old female with metastatic neuroendocrine tumor. Well differentiated tumor with somatostatin receptor is identified within the skeleton by DOTATATE PET CT scan. EXAM: NUCLEAR MEDICINE LUTATHERA ADMINISTRATION TECHNIQUE: Infusion: The nuclear medicine technologist verified the dose activity (203 mCi) to be delivered as specified in the written directive (200 mCi), and verified the patient identification via 2 separate methods. 20 gauge IV were started in the antecubital veins. Anti-emetics were administered by nursing staff. Amino acid renal protection was initiated 30 minutes prior to Lu 177 DOTATATE (Lutathera) infusion and continued continuously for 4 hours. Lutathera infusion was administered over 30 minutes. The total administered dose was 200.9 mCi Lu 177 DOTATATE. The entire IV tubing, venocatheter, stopcock and syringes was removed in total, placed in a disposal bag and sent for assay of the residual activity, which will be reported at a later time in our EMR by the physics staff. Pressure was applied to the venipuncture sites, and a compression bandage placed. Radiation Safety personnel were present to perform the discharge survey, as detailed on their documentation. Patient received 30 mg IM long-acting Sandostatin injection 4 hours after Lutathera effusion in the nuclear medicine department. RADIOPHARMACEUTICALS:  200.9 mCi Lu 177 DOTATATE FINDINGS: Diagnosis: Metastatic neuroendocrine tumor. Current Infusion: 1 Planned Infusions: 4 Patient reports minimal interval symptoms following therapy. The patient's most recent blood counts were reviewed and remains a good candidate to proceed with Lutathera. The patient was situated in an infusion suite and  administered Lutathera as above. Patient will follow-up with referring oncologist for interval serum laboratories (CBC and CMP) in approximately 4 weeks. Patient received 30 mg IM long-acting Sandostatin injection 4 hours after Lutathera effusion in the nuclear medicine department. IMPRESSION: First OI 712 [id number] treatment for metastatic neuroendocrine tumor. The patient tolerated the infusion well. The patient will return in 8 weeks for ongoing care. Electronically Signed   By: Kerby Moors M.D.   On: 04/15/2021 16:18     ASSESSMENT & PLAN Michelle Solomon 60 y.o. female with medical history significant for metastatic neuroendocrine tumor of the colon who presents for a follow up visit.  After review of the labs, review the pathology, and review the PET CT imaging the findings are most consistent with metastatic neuroendocrine tumor with metastasis to the spine.  The pathology currently appears to be consistent with the neuroendocrine tumor which was previously resected from the patient's rectum.  On exam today Ms. Mower notes she is tolerating treatment well and ready and able to proceed with continued lanreotide therapy.  Based on the results of her prior imaging I have offered to her the option for Lu 177 Dotatate treatment.  She met with the nuclear medicine physicians in order to discuss this treatment.  She is also had a second opinion with Dr. Leamon Arnt at [facility].   Previously we discussed the nature of metastatic neuroendocrine tumor and the treatment options moving forward.  Fortunately this is a slow-growing tumor and observation would be an appropriate treatment option.  I noted that we could do serial imaging to monitor the progression of the tumor and determine if treatment was required.  If the patient wanted to proceed with treatment we could consider lanreotide or octreotide subcu q. 28 days for control of the  disease.  I noted that the side effects that could be expected  would be nausea, vomiting, diarrhea, abdominal pain, and injection site reactions.  The patient noted that she would prefer to proceed with treatment.  We will plan to continue lanreotide 120 mg subcu q. 28 days with interval imaging approximately 3 months from the start of treatment.  The patient voiced her understanding of this plan and was eager to move forward.  # Metastatic Neuroendocrine Tumor, Metastasis to the Spine  --previously discussed with patient that the options would include observation with serial imaging or starting lanreotide therapy. The patient opted to start treatment --will plan for continued lanreotide 140m subq q28 days. Started therapy on 12/27/2019. This will be continued until progression or intolerance to therapy. Next dose due on 02/19/2021 --Due to the extensive involvement of her skeleton and increased activity on her last nuclear medicine scan it was recommended that she be considered for Lu 177 dotatate treatment.  I do believe this is a reasonable option for her.  We have made the referral to nuclear medicine.   --CU-64 scan q 3 months. Last in April 2022. [facility] of lymphoma.  --Patient expressed interest in a second opinion.  [facility]  He provided excellent advice regarding options moving forward.  After hearing all the different options the patient noted she would like to continue her current lanreotide shots with consideration of increased frequency if she were to be found to be progressing Plan:  --patient undergoing treatment with Lutathera therapy. She is s/p treatment 1 of 4. Appreciate assistance of NM. They will administer lanreotide on days where she undergoes Lutathera treatment (q 8 weeks x 4 doses) --currently working toward dental clearance for zometa.Patient seen by [employer] Need letter stating clearance before we start zometa.  --RTC in 6 weeks  for  interval visit.    #Mantle Cell Lymphoma -- Incidental finding from prior biopsy results.  [facility] --Unclear if this represents a small low-grade clinically insignificant population of mantle cell lymphoma or a more concerning issue --[facility] CT scan on 02/02/2021 showed no evidence of lymphoma.  --We will continue to monitor and appreciate the guidance of Dr. MCaprice Beaver No orders of the defined types were placed in this encounter.  All questions were answered. The patient knows to call the clinic with any problems, questions or concerns.  A total of more than 30 minutes were spent on this encounter and over half of that time was spent on counseling and coordination of care as outlined above.   JLedell Peoples MD Department of Hematology/Oncology [facility]Phone: [phone]: [phone]: [email or web address]  05/14/2021 10:57 AM

## 2021-05-17 LAB — CHROMOGRANIN A: Chromogranin A (ng/mL): 30 ng/mL (ref 0.0–101.8)

## 2021-06-08 NOTE — Written Directive (Addendum)
MOLECULAR IMAGING AND THERAPEUTICS WRITTEN DIRECTIVE   PATIENT NAME: Michelle Solomon  PT DOB:   1961-02-27                                              MRN: [id number]  ---------------------------------------------------------------------------------------------------------------------  [facility] THERAPY   RADIOPHARMACEUTICAL:  Lutetium 177 Dotatate (Lutathera)     PRESCRIBED DOSE FOR ADMINISTRATION:  200 mCi   ROUTE OFADMINISTRATION:  IV   DIAGNOSIS:  Neuroendocrine tumor   REFERRING PHYSICIAN: Dr. Narda Rutherford    TREATMENT #: 2   DATE OF LAST LONG LIVED SOMATOSTATIN INJECTION:   ADDITIONAL PHYSICIAN COMMENTS/NOTES:   AUTHORIZED USER SIGNATURE & TIME STAMP:John Melba Coon, MD   06/10/21    9:14 AM

## 2021-06-09 ENCOUNTER — Other Ambulatory Visit: Payer: Self-pay

## 2021-06-09 ENCOUNTER — Inpatient Hospital Stay: Payer: 59 | Attending: Hematology and Oncology

## 2021-06-09 DIAGNOSIS — C7B03 Secondary carcinoid tumors of bone: Secondary | ICD-10-CM | POA: Insufficient documentation

## 2021-06-09 DIAGNOSIS — Z79899 Other long term (current) drug therapy: Secondary | ICD-10-CM | POA: Insufficient documentation

## 2021-06-09 DIAGNOSIS — C7A025 Malignant carcinoid tumor of the sigmoid colon: Secondary | ICD-10-CM | POA: Diagnosis not present

## 2021-06-09 DIAGNOSIS — C8310 Mantle cell lymphoma, unspecified site: Secondary | ICD-10-CM | POA: Insufficient documentation

## 2021-06-09 DIAGNOSIS — C7B8 Other secondary neuroendocrine tumors: Secondary | ICD-10-CM

## 2021-06-09 LAB — CMP (CANCER CENTER ONLY)
ALT: 17 U/L (ref 0–44)
AST: 15 U/L (ref 15–41)
Albumin: 3.8 g/dL (ref 3.5–5.0)
Alkaline Phosphatase: 108 U/L (ref 38–126)
Anion gap: 10 (ref 5–15)
BUN: 13 mg/dL (ref 6–20)
CO2: 26 mmol/L (ref 22–32)
Calcium: 9.3 mg/dL (ref 8.9–10.3)
Chloride: 105 mmol/L (ref 98–111)
Creatinine: 0.84 mg/dL (ref 0.44–1.00)
GFR, Estimated: >60 mL/min (ref >60)
Glucose, Bld: 110 mg/dL — ABNORMAL HIGH (ref 70–99)
Potassium: 3.8 mmol/L (ref 3.5–5.1)
Sodium: 141 mmol/L (ref 135–145)
Total Bilirubin: 0.5 mg/dL (ref 0.3–1.2)
Total Protein: 7.3 g/dL (ref 6.5–8.1)

## 2021-06-09 LAB — CBC WITH DIFFERENTIAL (CANCER CENTER ONLY)
Abs Immature Granulocytes: 0.01 10*3/uL (ref 0.00–0.07)
Basophils Absolute: 0 10*3/uL (ref 0.0–0.1)
Basophils Relative: 0 %
Eosinophils Absolute: 0.1 10*3/uL (ref 0.0–0.5)
Eosinophils Relative: 2 %
HCT: 37.9 % (ref 36.0–46.0)
Hemoglobin: 12.6 g/dL (ref 12.0–15.0)
Immature Granulocytes: 0 %
Lymphocytes Relative: 38 %
Lymphs Abs: 2.4 10*3/uL (ref 0.7–4.0)
MCH: 27.5 pg (ref 26.0–34.0)
MCHC: 33.2 g/dL (ref 30.0–36.0)
MCV: 82.8 fL (ref 80.0–100.0)
Monocytes Absolute: 0.6 10*3/uL (ref 0.1–1.0)
Monocytes Relative: 9 %
Neutro Abs: 3.2 10*3/uL (ref 1.7–7.7)
Neutrophils Relative %: 51 %
Platelet Count: 239 10*3/uL (ref 150–400)
RBC: 4.58 MIL/uL (ref 3.87–5.11)
RDW: 14.5 % (ref 11.5–15.5)
WBC Count: 6.3 10*3/uL (ref 4.0–10.5)
nRBC: 0 % (ref 0.0–0.2)

## 2021-06-10 ENCOUNTER — Encounter (HOSPITAL_COMMUNITY)
Admission: RE | Admit: 2021-06-10 | Discharge: 2021-06-10 | Disposition: A | Discharge status: Home / Self Care | Payer: 59 | Source: Ambulatory Visit | Attending: Hematology and Oncology | Admitting: Hematology and Oncology

## 2021-06-10 ENCOUNTER — Other Ambulatory Visit (HOSPITAL_COMMUNITY): Payer: Self-pay | Admitting: Diagnostic Radiology

## 2021-06-10 DIAGNOSIS — C7A8 Other malignant neuroendocrine tumors: Secondary | ICD-10-CM | POA: Diagnosis present

## 2021-06-10 LAB — CHROMOGRANIN A: Chromogranin A (ng/mL): 19.9 ng/mL (ref 0.0–101.8)

## 2021-06-10 MED ORDER — SODIUM CHLORIDE 0.9 % IV SOLN
8.0000 mg | Freq: Once | INTRAVENOUS | Status: AC
  Administered 2021-06-10: 8 mg via INTRAVENOUS
  Filled 2021-06-10: qty 8

## 2021-06-10 MED ORDER — OCTREOTIDE ACETATE 500 MCG/ML IJ SOLN: 500.0000 ug | Freq: Once | INTRAMUSCULAR | Status: DC | PRN

## 2021-06-10 MED ORDER — LUTETIUM LU 177 DOTATATE 370 MBQ/ML IV SOLN
200.0000 | Freq: Once | INTRAVENOUS | Status: AC
  Administered 2021-06-10: 199.7 via INTRAVENOUS

## 2021-06-10 MED ORDER — OCTREOTIDE ACETATE 30 MG IM KIT: 30.0000 mg | PACK | Freq: Once | INTRAMUSCULAR | Status: DC

## 2021-06-10 MED ORDER — AMINO ACID RADIOPROTECTANT - L-LYSINE 2.5%/L-ARGININE 2.5% IN NS
250.0000 mL/h | INTRAVENOUS | Status: AC
  Administered 2021-06-10: 250 mL/h via INTRAVENOUS
  Filled 2021-06-10: qty 1000

## 2021-06-10 MED ORDER — LANREOTIDE ACETATE 120 MG/0.5ML ~~LOC~~ SOLN
SUBCUTANEOUS | Status: AC
  Administered 2021-06-10: 120 mg via SUBCUTANEOUS
  Filled 2021-06-10: qty 120

## 2021-06-10 MED ORDER — PROCHLORPERAZINE EDISYLATE 10 MG/2ML IJ SOLN: 10.0000 mg | Freq: Four times a day (QID) | INTRAMUSCULAR | Status: DC | PRN

## 2021-06-10 MED ORDER — SODIUM CHLORIDE 0.9 % IV SOLN: 500.0000 mL | Freq: Once | INTRAVENOUS | Status: DC

## 2021-06-10 MED ORDER — LANREOTIDE ACETATE 120 MG/0.5ML ~~LOC~~ SOLN: 120.0000 mg | Freq: Once | SUBCUTANEOUS | Status: AC

## 2021-06-10 MED ORDER — ONDANSETRON HCL 8 MG PO TABS: 8.0000 mg | ORAL_TABLET | Freq: Two times a day (BID) | ORAL | 0 refills | Status: AC | PRN

## 2021-06-10 NOTE — Progress Notes (Signed)
CLINICAL DATA: [age]] year-old [female] with metastatic neuroendocrine tumor. Well differentiated tumor with somatostatin receptor is identified within the [multiple sites within the skeleton] by DOTATATE PET CT scan. EXAM: NUCLEAR MEDICINE LUTATHERA ADMINISTRATION TECHNIQUE: Infusion: The nuclear medicine technologist and I personally verified the dose activity ([202] mCi) to be delivered as specified in the written directive (200 mCi), and verified the patient identification via 2 separate methods.  20 gauge IV were started in the antecubital veins. Anti-emetics were administered by nursing staff. Amino acid renal protection was initiated 30 minutes prior to Lu 177 DOTATATE (Lutathera) infusion and continued continuously for 4 hours. Lutathera infusion was administered over 30 minutes.     The total administered dose was [199.7] mCi Lu 177 DOTATATE.   The entire IV tubing, venocatheter, stopcock and syringes was removed in total, placed in a disposal bag and sent for assay of the residual activity, which will be reported at a later time in our EMR by the physics staff. Pressure was applied to the venipuncture sites, and a compression bandage placed. Radiation Safety personnel were present to perform the discharge survey, as detailed on their documentation.   Patient received 120 mg lanreotide IM injection 4 hours after Lutathera effusion in the nuclear medicine department.  RADIOPHARMACEUTICALS:   [199.7] mCi Lu 177 DOTATATE  FINDINGS: Diagnosis: [Metastatic neuroendocrine tumor.]   Current Infusion: [2]   Planned Infusions: [4]   Patient reports [minimal] interval symptoms following therapy.  Mild LEFT shoulder pain and back pain.  The patient's most recent blood counts were reviewed and remains a good candidate to proceed with Lutathera. The patient was situated in an infusion suite and administered Lutathera as above. Patient will follow-up with referring oncologist for interval  serum laboratories (CBC and CMP) in approximately 4 weeks.      Patient received 30 mg IM long-acting Sandostatin injection 4 hours after Lutathera effusion in the nuclear medicine department.    IMPRESSION: [Second] [id number] treatment for metastatic neuroendocrine tumor. The patient tolerated the infusion well. The patient will return in 8 weeks for ongoing care.

## 2021-06-10 NOTE — Written Directive (Addendum)
MOLECULAR IMAGING AND THERAPEUTICS WRITTEN DIRECTIVE   PATIENT NAME: Michelle Solomon  PT DOB:   [birth year]                                              MRN: [id number]  ---------------------------------------------------------------------------------------------------------------------  [facility] THERAPY   RADIOPHARMACEUTICAL:  Lutetium 177 Dotatate (Lutathera)     PRESCRIBED DOSE FOR ADMINISTRATION:  200 mCi   ROUTE OFADMINISTRATION:  IV   DIAGNOSIS:  Neuroendocrine Cancer   REFERRING PHYSICIAN:Dr. Lorenso Courier   TREATMENT #: 2   DATE OF LAST LONG LIVED SOMATOSTATIN INJECTION:   ADDITIONAL PHYSICIAN COMMENTS/NOTES:   AUTHORIZED USER SIGNATURE & TIME STAMP:

## 2021-07-02 ENCOUNTER — Other Ambulatory Visit: Payer: Self-pay | Admitting: *Deleted

## 2021-07-02 ENCOUNTER — Telehealth: Payer: Self-pay | Admitting: *Deleted

## 2021-07-02 MED ORDER — LANREOTIDE ACETATE 120 MG/0.5ML ~~LOC~~ SOLN: 120.0000 mg | SUBCUTANEOUS | 6 refills | Status: DC

## 2021-07-02 NOTE — Telephone Encounter (Signed)
TCT patient regarding recent changes in her Lanreotide injections per her insurance company. Pt's [employer]  now says pt  has to give her own Lanreotide at home and have it sent to her from [place].  Spoke with pt. Explained the above. She states she does not know anything about this-she has not heard from her insurance company at all. This is scheduled to start in January 2023.[name] Kitchen Her next treatment was scheduled on 07/09/21. Michelle Solomon voiced that she does not want to do this injection at home.  She voices she feels safer to have it done at the [facility] as she has done since June 2021.

## 2021-07-06 ENCOUNTER — Other Ambulatory Visit: Payer: Self-pay | Admitting: *Deleted

## 2021-07-06 MED ORDER — LANREOTIDE ACETATE 120 MG/0.5ML ~~LOC~~ SOLN: 120.0000 mg | SUBCUTANEOUS | 12 refills | Status: AC

## 2021-07-07 ENCOUNTER — Telehealth: Payer: Self-pay | Admitting: *Deleted

## 2021-07-07 NOTE — Telephone Encounter (Signed)
Order for Somatuline injection was signed by Dede Query, P.A on 07/06/21 and faxed to [facility] @ [phone]. Confirmation fax was received

## 2021-07-09 ENCOUNTER — Inpatient Hospital Stay: Payer: 59

## 2021-07-09 ENCOUNTER — Inpatient Hospital Stay: Payer: 59 | Attending: Hematology and Oncology

## 2021-07-09 ENCOUNTER — Inpatient Hospital Stay (HOSPITAL_BASED_OUTPATIENT_CLINIC_OR_DEPARTMENT_OTHER): Payer: 59 | Admitting: Physician Assistant

## 2021-07-09 ENCOUNTER — Other Ambulatory Visit: Payer: Self-pay

## 2021-07-09 VITALS — BP 163/106 | HR 95 | Temp 97.7°F | Resp 17 | Wt 191.1 lb

## 2021-07-09 DIAGNOSIS — C7951 Secondary malignant neoplasm of bone: Secondary | ICD-10-CM | POA: Insufficient documentation

## 2021-07-09 DIAGNOSIS — C7B03 Secondary carcinoid tumors of bone: Secondary | ICD-10-CM | POA: Insufficient documentation

## 2021-07-09 DIAGNOSIS — C7B8 Other secondary neuroendocrine tumors: Secondary | ICD-10-CM

## 2021-07-09 DIAGNOSIS — C7A025 Malignant carcinoid tumor of the sigmoid colon: Secondary | ICD-10-CM | POA: Diagnosis not present

## 2021-07-09 DIAGNOSIS — Z79899 Other long term (current) drug therapy: Secondary | ICD-10-CM | POA: Diagnosis not present

## 2021-07-09 LAB — CBC WITH DIFFERENTIAL (CANCER CENTER ONLY)
Abs Immature Granulocytes: 0.01 10*3/uL (ref 0.00–0.07)
Basophils Absolute: 0 10*3/uL (ref 0.0–0.1)
Basophils Relative: 1 %
Eosinophils Absolute: 0.1 10*3/uL (ref 0.0–0.5)
Eosinophils Relative: 2 %
HCT: 37.5 % (ref 36.0–46.0)
Hemoglobin: 12.4 g/dL (ref 12.0–15.0)
Immature Granulocytes: 0 %
Lymphocytes Relative: 24 %
Lymphs Abs: 1.5 10*3/uL (ref 0.7–4.0)
MCH: 27.6 pg (ref 26.0–34.0)
MCHC: 33.1 g/dL (ref 30.0–36.0)
MCV: 83.5 fL (ref 80.0–100.0)
Monocytes Absolute: 0.7 10*3/uL (ref 0.1–1.0)
Monocytes Relative: 11 %
Neutro Abs: 3.8 10*3/uL (ref 1.7–7.7)
Neutrophils Relative %: 62 %
Platelet Count: 224 10*3/uL (ref 150–400)
RBC: 4.49 MIL/uL (ref 3.87–5.11)
RDW: 13.8 % (ref 11.5–15.5)
WBC Count: 6.1 10*3/uL (ref 4.0–10.5)
nRBC: 0 % (ref 0.0–0.2)

## 2021-07-09 LAB — CMP (CANCER CENTER ONLY)
ALT: 15 U/L (ref 0–44)
AST: 15 U/L (ref 15–41)
Albumin: 4.2 g/dL (ref 3.5–5.0)
Alkaline Phosphatase: 105 U/L (ref 38–126)
Anion gap: 7 (ref 5–15)
BUN: 16 mg/dL (ref 6–20)
CO2: 29 mmol/L (ref 22–32)
Calcium: 10 mg/dL (ref 8.9–10.3)
Chloride: 103 mmol/L (ref 98–111)
Creatinine: 0.86 mg/dL (ref 0.44–1.00)
GFR, Estimated: >60 mL/min (ref >60)
Glucose, Bld: 143 mg/dL — ABNORMAL HIGH (ref 70–99)
Potassium: 3.6 mmol/L (ref 3.5–5.1)
Sodium: 139 mmol/L (ref 135–145)
Total Bilirubin: 0.4 mg/dL (ref 0.3–1.2)
Total Protein: 7.6 g/dL (ref 6.5–8.1)

## 2021-07-09 MED ORDER — LANREOTIDE ACETATE 120 MG/0.5ML ~~LOC~~ SOLN
120.0000 mg | Freq: Once | SUBCUTANEOUS | Status: AC
  Administered 2021-07-09: 120 mg via SUBCUTANEOUS
  Filled 2021-07-09: qty 120

## 2021-07-09 NOTE — Patient Instructions (Signed)
Lanreotide injection °What is this medication? °LANREOTIDE (lan REE oh tide) is used to reduce blood levels of growth hormone in patients with a condition called acromegaly. It also works to slow or stop tumor growth in patients with neuroendocrine tumors and treat carcinoid syndrome. °This medicine may be used for other purposes; ask your health care provider or pharmacist if you have questions. °COMMON BRAND NAME(S): Somatuline Depot °What should I tell my care team before I take this medication? °They need to know if you have any of these conditions: °diabetes °gallbladder disease °heart disease °kidney disease °liver disease °thyroid disease °an unusual or allergic reaction to lanreotide, other medicines, foods, dyes, or preservatives °pregnant or trying to get pregnant °breast-feeding °How should I use this medication? °This medicine is for injection under the skin. It is given by a health care professional in a hospital or clinic setting. °Contact your pediatrician or health care professional regarding the use of this medicine in children. Special care may be needed. °Overdosage: If you think you have taken too much of this medicine contact a poison control center or emergency room at once. °NOTE: This medicine is only for you. Do not share this medicine with others. °What if I miss a dose? °It is important not to miss your dose. Call your doctor or health care professional if you are unable to keep an appointment. °What may interact with this medication? °This medicine may interact with the following medications: °bromocriptine °cyclosporine °certain medicines for blood pressure, heart disease, irregular heart beat °certain medicines for diabetes °quinidine °terfenadine °This list may not describe all possible interactions. Give your health care provider a list of all the medicines, herbs, non-prescription drugs, or dietary supplements you use. Also tell them if you smoke, drink alcohol, or use illegal drugs.  Some items may interact with your medicine. °What should I watch for while using this medication? °Tell your doctor or healthcare professional if your symptoms do not start to get better or if they get worse. °Visit your doctor or health care professional for regular checks on your progress. Your condition will be monitored carefully while you are receiving this medicine. °This medicine may increase blood sugar. Ask your healthcare provider if changes in diet or medicines are needed if you have diabetes. °You may need blood work done while you are taking this medicine. °Women should inform their doctor if they wish to become pregnant or think they might be pregnant. There is a potential for serious side effects to an unborn child. Talk to your health care professional or pharmacist for more information. Do not breast-feed an infant while taking this medicine or for 6 months after stopping it. °This medicine has caused ovarian failure in some women. This medicine may interfere with the ability to have a child. Talk with your doctor or health care professional if you are concerned about your fertility. °What side effects may I notice from receiving this medication? °Side effects that you should report to your doctor or health care professional as soon as possible: °allergic reactions like skin rash, itching or hives, swelling of the face, lips, or tongue °increased blood pressure °severe stomach pain °signs and symptoms of hgh blood sugar such as being more thirsty or hungry or having to urinate more than normal. You may also feel very tired or have blurry vision. °signs and symptoms of low blood sugar such as feeling anxious; confusion; dizziness; increased hunger; unusually weak or tired; sweating; shakiness; cold; irritable; headache; blurred vision; fast   heartbeat; loss of consciousness °unusually slow heartbeat °Side effects that usually do not require medical attention (report to your doctor or health care  professional if they continue or are bothersome): °constipation °diarrhea °dizziness °headache °muscle pain °muscle spasms °nausea °pain, redness, or irritation at site where injected °This list may not describe all possible side effects. Call your doctor for medical advice about side effects. You may report side effects to FDA at 1-800-FDA-1088. °Where should I keep my medication? °This drug is given in a hospital or clinic and will not be stored at home. °NOTE: This sheet is a summary. It may not cover all possible information. If you have questions about this medicine, talk to your doctor, pharmacist, or health care provider. °© 2022 Elsevier/Gold Standard (2018-05-09 00:00:00) ° °

## 2021-07-12 ENCOUNTER — Telehealth: Payer: Self-pay | Admitting: *Deleted

## 2021-07-12 ENCOUNTER — Encounter: Payer: Self-pay | Admitting: Hematology and Oncology

## 2021-07-12 NOTE — Telephone Encounter (Signed)
Call made to [facility] to cancel the delivery of the Somatuline Depot. Pt received the injection here @ [facility] on 07/09/21 Delivery has been cancelled.

## 2021-07-12 NOTE — Progress Notes (Signed)
[place] Telephone:(336) [phone]   [phone]  PROGRESS NOTE  Patient Care Team: Biagio Borg, MD as PCP - General  Hematological/Oncological History # Metastatic Neuroendocrine Tumor, Metastasis to the Spine 1) 08/26/2019: CT Abdomen Pelvis performed due to RUQ abdominal pain. Imaging showed a soft tissue mass abutting the right posterior eighth rib 2) 09/06/2019: CT chest performed which showed Ill-defined soft tissue nodule along the medial right posterior thoracic wall at the 8th intercostal space to the right of the spine. MRI was recommended. 3) 10/19/2019: MRI Thoracic spine showed enhancing lesions throughout the visualized spine consistent with metastatic disease. Additionally there was a 1.5 cm soft tissue nodule in the posteromedial right eighth intercostal space, more concerning for a metastasis 4) 10/25/2019: establish care with Dr. Lorenso Courier   5) 11/05/2019: attempted biopsy of soft tissue mass at right posterior 8th rib, but lesion had dissipated at time of biopsy attempt 6) 11/27/2019: PET CT scan performed showing right inguinal lymph nodes and osseous lesions, indicative of metastatic disease of unknown primary. 7) 12/11/2019: CT bone marrow biopsy showed metastatic neuroendocrine neoplasm 8) 12/27/2019: start of lanreotide 136m sub q28 days  9) 10/15/2020: NM PET (CU-64) scan showed widespread osseous and lymphatic uptake consistent with metastatic spread of neuroendocrine tumor.  This is more avid than prior. 01/20/2021: clinic visit with Dr. MCaprice Beaverdue to concern for possible mantle cell lymphoma seen on pathology review of previous biopsy 04/15/2021: Dose 1 (of 4) of lutathera treatment with nuclear medicine.  06/10/2021: Dose 2 (of 4) of lutathera treatment with nuclear medicine.    #Low Grade Neuroendocrine Tumor 1) 2006: reportedly had rectal carcinoid tumor removed 2) 08/14/2008: patient had a flexibile sigmoidoscopy with endoscopic ultrasound which  resected an 819m subepithelial lesion in rectum. Findings consistent with low grade neuroendocrine tumor. 3) 04/08/2010: repeat colonoscopy, no residual tumor. Repeat recommended in 2016.  4) 12/11/2019: metastatic recurrence found on biopsy, noted above.   #Mantle Cell Lymphoma 11/24/2020: incidental findings of low level mantle cell lymphoma on bone marrow biopsy. [facility] 01/20/2021: clinic visit with Dr. McCaprice Beaverue to concern for possible mantle cell lymphoma seen on pathology review of previous biopsy  Interval History:  Michelle [age]Langsam89.o. female with medical history significant for metastatic neuroendocrine tumor of the colon who presents for a follow up visit. The patient's last visit was on 05/14/2021. In the interim since the last visit she continue on monthly lanreotide injections and received her second lutathera treatment.   On exam today Ms. CaCarpinoeports she continues to tolerate her lutathera treatments with no major side effects.  She reports that her energy levels are fairly stable.  She has some fatigue but continues to complete all her daily activities on her own.  Patient has a good appetite without any recent weight changes.  She denies any nausea, vomiting or abdominal pain.  Patient reports 2 to 3 days of diarrhea following the monthly lanreotide injection.  She does not require taking any antidiarrheals at this time and symptoms resolve on its own.  Patient reports that on 06/26/2021, patient stumbled and fell without any noted head injury.  She does continue to have some bone pain including left shoulder pain but has been stable from prior.  She denies any fevers, chills, night sweats, shortness of breath, chest pain, cough, neuropathy, edema, rash, flushing, palpitations, easy bruising or signs of bleeding.    She does not have any other  questions or concerns today.  A full 10 point ROS is listed below.  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   Cancer ([place])    Neuro endocrine tumor   CONJUNCTIVITIS, ALLERGIC 11/27/2008   HEMORRHOIDS 06/16/2008   HTN (hypertension)    Hx of adenomatous colonic polyps 11/2019   NIPPLE DISCHARGE 01/21/2010   Other specified forms of hearing loss 10/08/2009   Overweight(278.02) 05/29/2007   RASH-NONVESICULAR 05/29/2007   Wheezing 06/22/2010    SURGICAL HISTORY: Past Surgical History:  Procedure Laterality Date   COLONOSCOPY      SOCIAL HISTORY: Social History   Socioeconomic History   Marital status: Married    Spouse name: Not on file   Number of children: 3   Years of education: Not on file   Highest education level: Not on file  Occupational History   Occupation: [place]    Employer: [employer]   Occupation: customer service  Tobacco Use   Smoking status: Some Days    Packs/day: 0.50    Years: 40.00    Pack years: 20.00    Types: Cigarettes    Last attempt to quit: 10/22/2019    Years since quitting: 1.7   Smokeless tobacco: Never   Tobacco comments:    1 pack weekly.  started smoking at 61 years old  Vaping Use   Vaping Use: Never used  Substance and Sexual Activity   Alcohol use: Yes    Comment: ocassional   Drug use: No   Sexual activity: Not Currently  Other Topics Concern   Not on file  Social History Narrative   Daily caffeine use 3   Patient does not get regular exercise   Social Determinants of Health   Financial Resource Strain: Not on file  Food Insecurity: Not on file  Transportation Needs: Not on file  Physical Activity: Not on file  Stress: Not on file  Social Connections: Not on file  Intimate Partner Violence: Not on file    FAMILY  HISTORY: Family History  Problem Relation Age of Onset   Allergies Sister    Diabetes Sister    Hypertension Sister    Hypertension Mother    Prostate cancer Father    Hypertension Brother    Hypertension Sister    Diabetes Paternal Aunt    Cancer Paternal Aunt        type unknown   Prostate cancer Brother    Stroke Maternal Aunt    Hypertension Maternal Aunt    Colon cancer Neg Hx    Esophageal cancer Neg Hx    Rectal cancer Neg Hx    Stomach cancer Neg Hx     ALLERGIES:  is allergic to shellfish allergy.  MEDICATIONS:  Current Outpatient Medications  Medication Sig Dispense Refill   ALPRAZolam (XANAX) 0.5 MG tablet Take 1 tablet (0.5 mg total) by mouth 2 (two) times daily as needed for anxiety or sleep. 60 tablet 2   buPROPion (WELLBUTRIN XL) 300 MG 24 hr tablet TAKE 1 TABLET(300 MG) BY MOUTH DAILY 90 tablet 1   cholecalciferol (VITAMIN D3) 25 MCG (1000 UNIT) tablet Take 1,000 Units by mouth daily.     escitalopram (LEXAPRO)  20 MG tablet Take 20 mg by mouth daily.     hydrochlorothiazide (HYDRODIURIL) 12.5 MG tablet TAKE 1 TABLET(12.5 MG) BY MOUTH DAILY 90 tablet 3   hydrocortisone 2.5 % cream Apply topically 2 (two) times daily. 30 g 1   iron polysaccharides (NU-IRON) 150 MG capsule Take 1 capsule (150 mg total) by mouth daily. 90 capsule 1   lanreotide acetate (SOMATULINE DEPOT) 120 MG/0.5ML injection Inject 120 mg into the skin every 28 (twenty-eight) days. 0.5 mL 6   lanreotide acetate (SOMATULINE DEPOT) 120 MG/0.5ML injection Inject 120 mg into the skin every 28 (twenty-eight) days. 0.5 mL 12   loratadine (CLARITIN) 10 MG tablet Take 10 mg by mouth daily as needed for allergies.     ondansetron (ZOFRAN) 8 MG tablet Take 1 tablet (8 mg total) by mouth 2 (two) times daily as needed for nausea or vomiting. 20 tablet 0   ondansetron (ZOFRAN) 8 MG tablet Take 1 tablet (8 mg total) by mouth 2 (two) times daily as needed for nausea or vomiting. 20 tablet 0    oxyCODONE-acetaminophen (PERCOCET/ROXICET) 5-325 MG tablet Take 1 tablet by mouth every 6 (six) hours as needed for severe pain. 30 tablet 0   Turmeric 500 MG CAPS Take 500 mg by mouth daily.     vitamin B-12 (CYANOCOBALAMIN) 100 MCG tablet Take 100 mcg by mouth daily.     VITAMIN D-VITAMIN K PO Take 1 tablet by mouth daily.     zolpidem (AMBIEN) 10 MG tablet Take 1 tablet (10 mg total) by mouth at bedtime as needed for up to 30 days for sleep. 30 tablet 5   No current facility-administered medications for this visit.    REVIEW OF SYSTEMS:   Constitutional: ( - ) fevers, ( - )  chills , ( - ) night sweats Eyes: ( - ) blurriness of vision, ( - ) double vision, ( - ) watery eyes Ears, nose, mouth, throat, and face: ( - ) mucositis, ( - ) sore throat Respiratory: ( - ) cough, ( - ) dyspnea, ( - ) wheezes Cardiovascular: ( - ) palpitation, ( - ) chest discomfort, ( - ) lower extremity swelling Gastrointestinal:  ( - ) nausea, ( - ) heartburn, ( - ) change in bowel habits Skin: ( - ) abnormal skin rashes Lymphatics: ( - ) new lymphadenopathy, ( - ) easy bruising Neurological: ( - ) numbness, ( - ) tingling, ( - ) new weaknesses Behavioral/Psych: ( - ) mood change, ( - ) new changes  All other systems were reviewed with the patient and are negative.  PHYSICAL EXAMINATION: ECOG PERFORMANCE STATUS: 0 - Asymptomatic  Vitals:   07/09/21 1109  BP: (!) 163/106  Pulse: 95  Resp: 17  Temp: 97.7 F (36.5 C)  SpO2: 99%    Filed Weights   07/09/21 1109  Weight: 191 lb 1 oz (86.7 kg)     GENERAL: well appearing middle aged [place] American female in NAD  SKIN: skin color, texture, turgor are normal, no rashes or significant lesions EYES: conjunctiva are pink and non-injected, sclera clear LUNGS: clear to auscultation and percussion with normal breathing effort HEART: regular rate & rhythm and no murmurs and no lower extremity edema Musculoskeletal: no cyanosis of digits and no clubbing   PSYCH: alert & oriented x 3, fluent speech NEURO: no focal motor/sensory deficits  LABORATORY DATA:  I have reviewed the data as listed CBC Latest Ref Rng & Units 07/09/2021 06/09/2021 05/14/2021  WBC  4.0 - 10.5 K/uL 6.1 6.3 6.9  Hemoglobin 12.0 - 15.0 g/dL 12.4 12.6 11.9(L)  Hematocrit 36.0 - 46.0 % 37.5 37.9 36.8  Platelets 150 - 400 K/uL 224 239 218    CMP Latest Ref Rng & Units 07/09/2021 06/09/2021 05/14/2021  Glucose 70 - 99 mg/dL 143(H) 110(H) 121(H)  BUN 6 - 20 mg/dL 16 13 17   Creatinine 0.44 - 1.00 mg/dL 0.86 0.84 0.84  Sodium 135 - 145 mmol/L 139 141 141  Potassium 3.5 - 5.1 mmol/L 3.6 3.8 3.8  Chloride 98 - 111 mmol/L 103 105 105  CO2 22 - 32 mmol/L 29 26 27   Calcium 8.9 - 10.3 mg/dL 10.0 9.3 9.1  Total Protein 6.5 - 8.1 g/dL 7.6 7.3 7.3  Total Bilirubin 0.3 - 1.2 mg/dL 0.4 0.5 0.4  Alkaline Phos 38 - 126 U/L 105 108 106  AST 15 - 41 U/L 15 15 14(L)  ALT 0 - 44 U/L 15 17 15     RADIOGRAPHIC STUDIES: I have personally reviewed the radiological images as listed and agreed with the findings in the report.  No results found.   ASSESSMENT & PLAN Michelle Solomon 61 y.o. female with medical history significant for metastatic neuroendocrine tumor of the colon who presents for a follow up visit.  After review of the labs, review the pathology, and review the PET CT imaging the findings are most consistent with metastatic neuroendocrine tumor with metastasis to the spine.  The pathology currently appears to be consistent with the neuroendocrine tumor which was previously resected from the patient's rectum.  # Metastatic Neuroendocrine Tumor, Metastasis to the Spine  --previously discussed with patient that the options would include observation with serial imaging or starting lanreotide therapy. The patient opted to start treatment --will plan for continued lanreotide 121m subq q28 days. Started therapy on 12/27/2019. This will be continued until progression or intolerance to  therapy. Next dose due on 02/19/2021 --Due to the extensive involvement of her skeleton and increased activity on her last nuclear medicine scan it was recommended that she be considered for Lu 177 dotatate treatment. Initiated treatment on 04/15/2021 with plans to complete a total of 4 treatments.  --CU-64 scan q 3 months. Last in April 2022. [facility] no evidence of lymphoma.  --Patient expressed interest in a second opinion.  [facility]  He provided excellent advice regarding options moving forward.  After hearing all the different options the patient noted she would like to continue her current lanreotide shots with consideration of increased frequency if she were to be found to be progressing Plan:  --patient undergoing treatment with Lutathera therapy. She is s/p treatment 2 of 4. They will administer 30 mg IM long acting sandostatin on days where she undergoes Lutathera treatment (q 8 weeks x 4 doses) --currently working toward dental clearance for zometa.Patient seen by [employer] Need letter stating clearance before we start zometa.  --Due for 3rd Lutathera treatment on 08/05/21 and will return for labs, follow up visit with Dr. DLorenso Courierand lanreotide injection on 09/03/2021.   #Mantle Cell Lymphoma -- Incidental finding from prior biopsy results.  [facility] --Unclear if this represents a small low-grade clinically insignificant population of mantle cell lymphoma or a more concerning issue --[facility]--PET CT scan on 02/02/2021 showed  no evidence of lymphoma.  --We will continue to monitor and appreciate the guidance of Dr. Caprice Beaver  No orders of the defined types were placed in this encounter.   All questions were answered. The patient knows to call  the clinic with any problems, questions or concerns.  I have spent a total of 25 minutes minutes of face-to-face and non-face-to-face time, preparing to see the patient, performing a medically appropriate examination, counseling and educating the patient, ordering medications, documenting clinical information in the electronic health record, and care coordination.    Dede Query PA-C Dept of Hematology and [place] at [facility] Phone: [phone]   07/12/2021 1:02 PM

## 2021-08-02 NOTE — Written Directive (Addendum)
MOLECULAR IMAGING AND THERAPEUTICS WRITTEN DIRECTIVE   PATIENT NAME: Michelle Solomon  PT DOB:   [birth year]                                              MRN: [id number]  ---------------------------------------------------------------------------------------------------------------------  [facility]   RADIOPHARMACEUTICAL:  Lutetium 177 Dotatate (Lutathera)     PRESCRIBED DOSE FOR ADMINISTRATION:  200 mCi   ROUTE OFADMINISTRATION:  IV   DIAGNOSIS:  Well Differentiated Neuroendocrine Tumors   REFERRING PHYSICIAN: Dorsey   TREATMENT #: 3   DATE OF LAST LONG LIVED SOMATOSTATIN INJECTION: 07/09/21   ADDITIONAL PHYSICIAN COMMENTS/NOTES: Normal CBC and CMP.  Chromagranin A decreased.     AUTHORIZED USER SIGNATURE & TIME STAMP: Rennis Golden, MD   08/03/21    8:14 AM

## 2021-08-04 IMAGING — PT NM PET TUM IMG INITIAL (PI) SKULL BASE T - THIGH
1 series · 11 of 11 positions shown · non-contrast
Comparison: MR thoracic spine 10/18/2019, CT chest 09/06/2019 and
CT abdomen pelvis 08/26/2019.

CLINICAL DATA: Initial treatment strategy for bone lesions.

EXAM:
NUCLEAR MEDICINE PET SKULL BASE TO THIGH
TECHNIQUE: 9.5 mCi F-18 FDG was injected intravenously. Full-ring PET imaging
was performed from the skull base to thigh after the radiotracer. CT
data was obtained and used for attenuation correction and anatomic
localization.
Fasting blood glucose: 98 mg/dl

[Series 1095: results mm oncology reading · 1.0mm · 1.01mm/px · 11 of 11 slices shown]
[im 1/11]
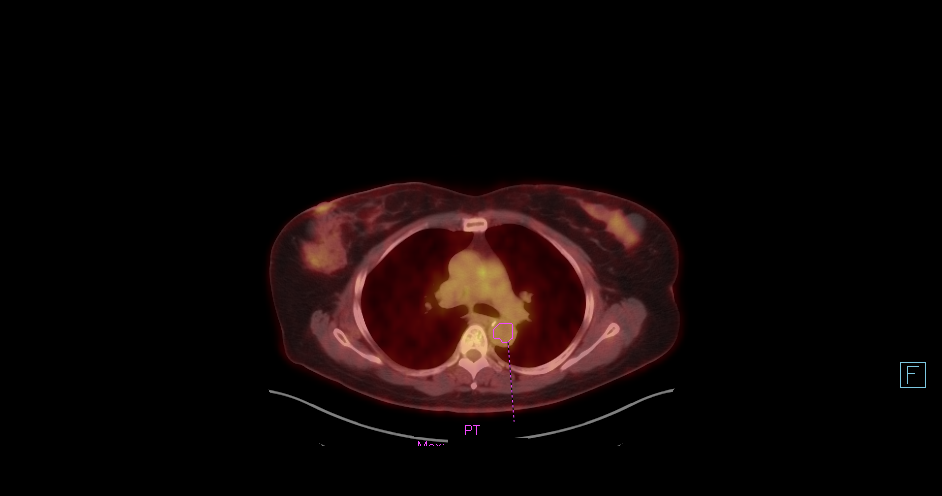
[im 2/11]
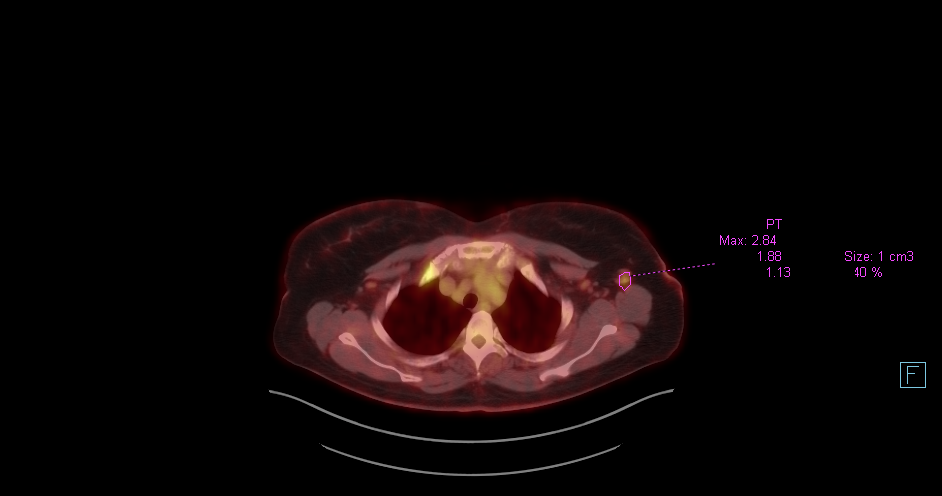
[im 3/11]
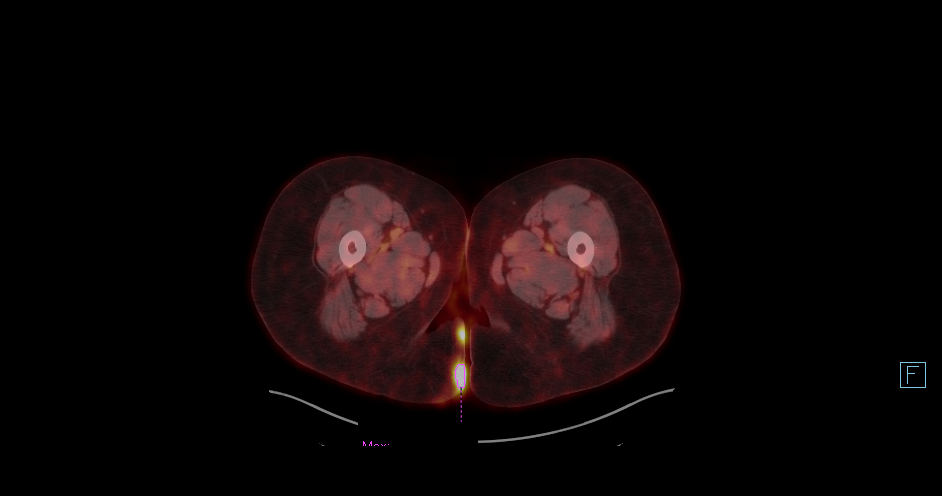
[im 4/11]
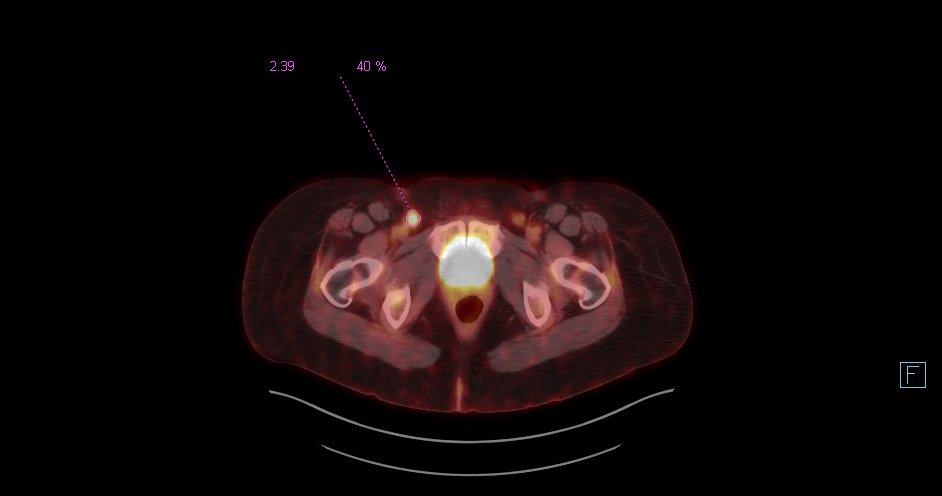
[im 5/11]
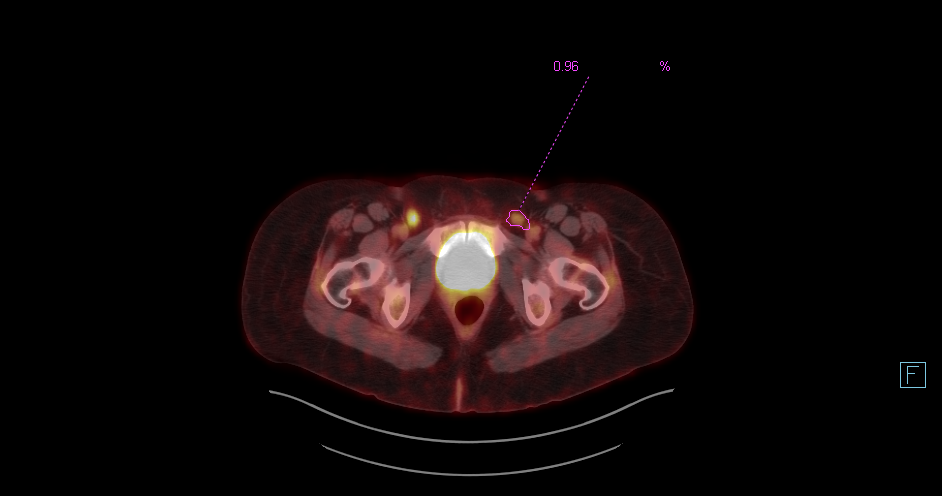
[im 6/11]
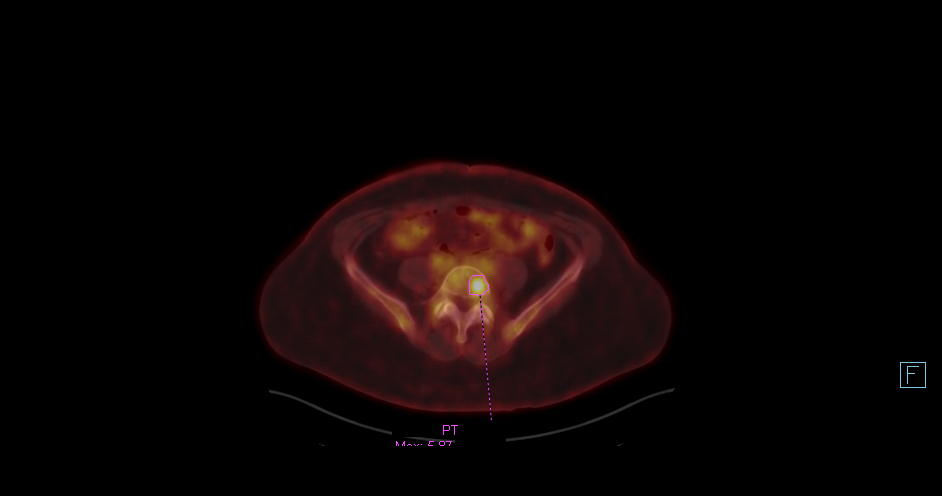
[im 7/11]
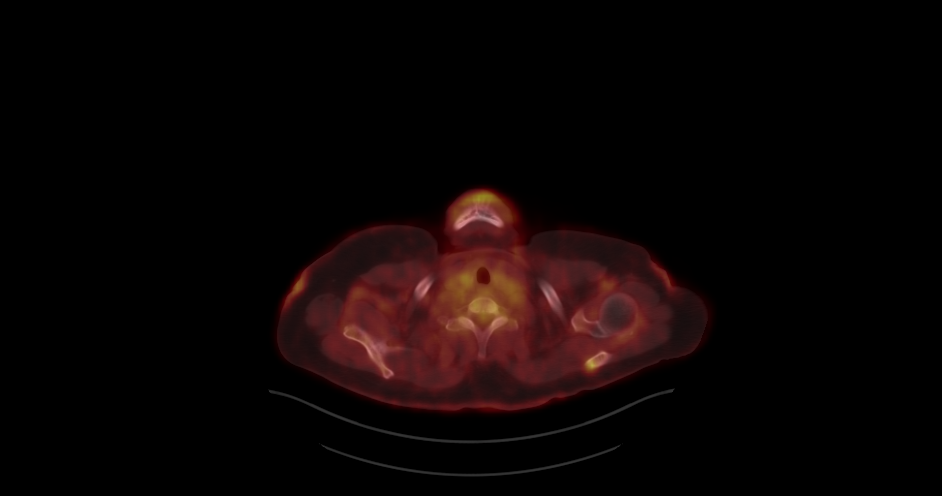
[im 8/11]
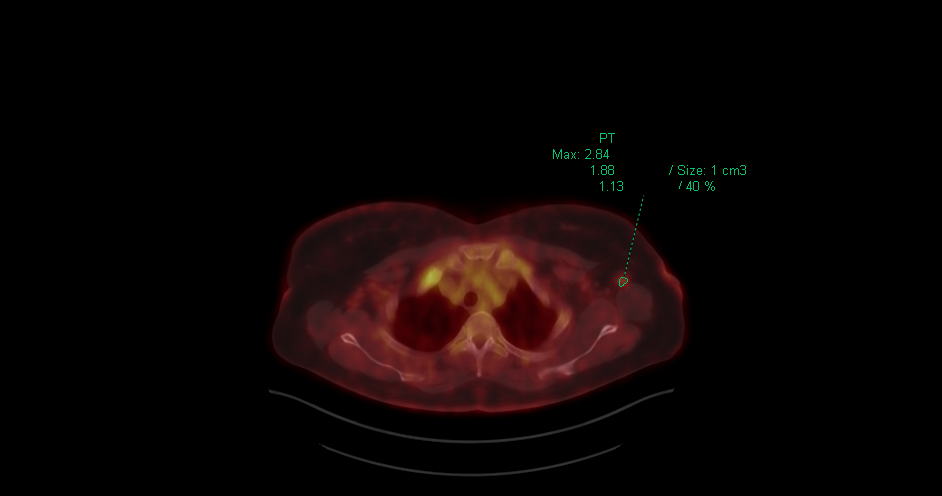
[im 9/11]
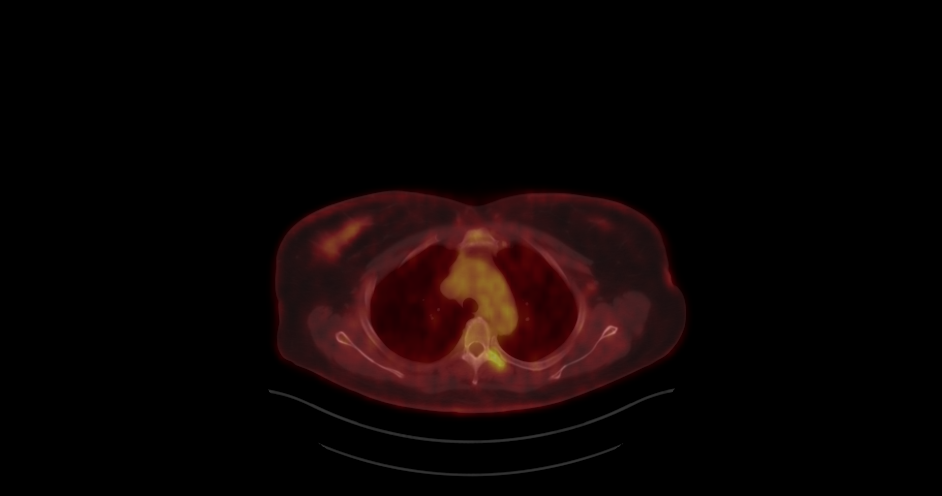
[im 10/11]
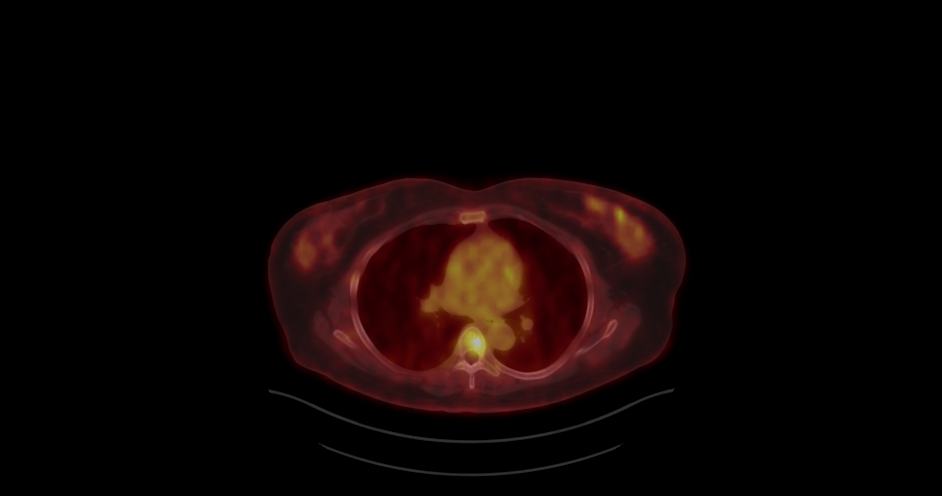
[im 11/11]
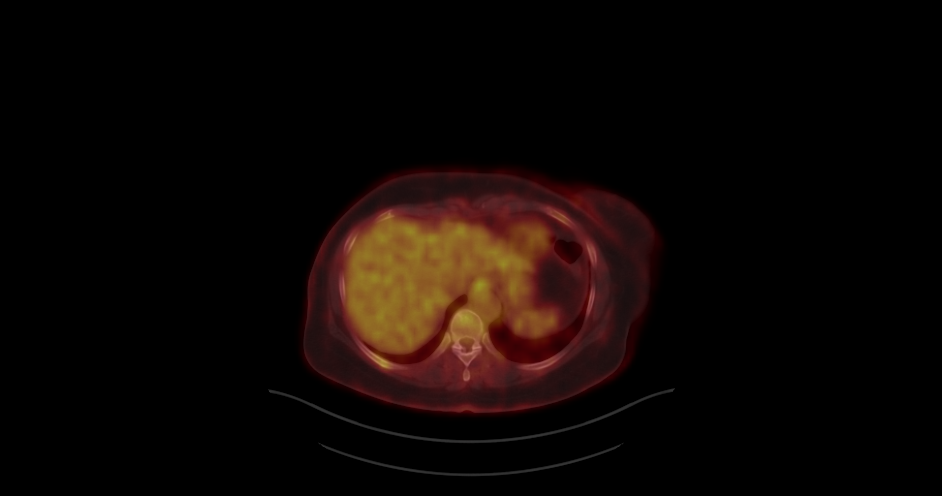

[11 of 11 positions shown; findings below may reference images not displayed]

FINDINGS: Mediastinal blood pool activity: SUV max

Liver activity: SUV max NA

NECK:

No abnormal hypermetabolism.

Incidental CT findings:

None.

CHEST:

A nodule in the upper right breast and bilateral axillary lymph
nodes do not show abnormal hypermetabolism above blood pool. No
hypermetabolic mediastinal or hilar lymph nodes. No hypermetabolic
pulmonary nodules.

Incidental CT findings:

Atherosclerotic calcification of the aorta. Heart is at the upper
limits of normal in size. No pericardial or pleural effusion.

ABDOMEN/PELVIS:

No abnormal hypermetabolism in the liver, adrenal glands, spleen or
pancreas. Hypermetabolic right inguinal lymph nodes measure up to
1.3 cm (4/173) with an SUV max of 6.0. Left inguinal lymph nodes do
not show hypermetabolism above blood pool. No additional
hypermetabolic lymph nodes. There are 2 areas of soft tissue
thickening in the subcutaneous right gluteal fold (4/194) with an
SUV max of 11.8 posteriorly.

Incidental CT findings:

Liver, gallbladder, adrenal glands, kidneys, spleen, pancreas,
stomach and bowel are grossly unremarkable.

SKELETON:

There are vague areas of subtle sclerosis throughout the visualized
osseous structures. Index lesion in the L5 vertebral body measures
1.6 cm (4/138) with an SUV max of 5.9.

Incidental CT findings:

None.
IMPRESSION: 1. Hypermetabolic right inguinal lymph nodes and osseous lesions,
indicative of metastatic disease of unknown primary.
2. Focal areas of soft tissue thickening in the subcutaneous right
gluteal fold with associated hypermetabolism. Findings may be
infectious or inflammatory in etiology. Difficult to exclude
metastatic disease.

## 2021-08-05 ENCOUNTER — Other Ambulatory Visit: Payer: Self-pay

## 2021-08-05 ENCOUNTER — Ambulatory Visit (HOSPITAL_COMMUNITY)
Admission: RE | Admit: 2021-08-05 | Discharge: 2021-08-05 | Disposition: A | Discharge status: Home / Self Care | Payer: 59 | Source: Ambulatory Visit | Attending: Hematology and Oncology | Admitting: Hematology and Oncology

## 2021-08-05 DIAGNOSIS — C7A8 Other malignant neuroendocrine tumors: Secondary | ICD-10-CM | POA: Diagnosis not present

## 2021-08-05 LAB — COMPREHENSIVE METABOLIC PANEL
ALT: 20 U/L (ref 0–44)
AST: 21 U/L (ref 15–41)
Albumin: 4.3 g/dL (ref 3.5–5.0)
Alkaline Phosphatase: 98 U/L (ref 38–126)
Anion gap: 9 (ref 5–15)
BUN: 10 mg/dL (ref 6–20)
CO2: 28 mmol/L (ref 22–32)
Calcium: 9.5 mg/dL (ref 8.9–10.3)
Chloride: 99 mmol/L (ref 98–111)
Creatinine, Ser: 0.73 mg/dL (ref 0.44–1.00)
GFR, Estimated: >60 mL/min (ref >60)
Glucose, Bld: 118 mg/dL — ABNORMAL HIGH (ref 70–99)
Potassium: 3.4 mmol/L — ABNORMAL LOW (ref 3.5–5.1)
Sodium: 136 mmol/L (ref 135–145)
Total Bilirubin: 0.3 mg/dL (ref 0.3–1.2)
Total Protein: 7.7 g/dL (ref 6.5–8.1)

## 2021-08-05 LAB — CBC WITH DIFFERENTIAL/PLATELET
Abs Immature Granulocytes: 0.01 10*3/uL (ref 0.00–0.07)
Basophils Absolute: 0 10*3/uL (ref 0.0–0.1)
Basophils Relative: 0 %
Eosinophils Absolute: 0.1 10*3/uL (ref 0.0–0.5)
Eosinophils Relative: 2 %
HCT: 39.5 % (ref 36.0–46.0)
Hemoglobin: 12.9 g/dL (ref 12.0–15.0)
Immature Granulocytes: 0 %
Lymphocytes Relative: 31 %
Lymphs Abs: 1.9 10*3/uL (ref 0.7–4.0)
MCH: 27.8 pg (ref 26.0–34.0)
MCHC: 32.7 g/dL (ref 30.0–36.0)
MCV: 85.1 fL (ref 80.0–100.0)
Monocytes Absolute: 0.6 10*3/uL (ref 0.1–1.0)
Monocytes Relative: 10 %
Neutro Abs: 3.5 10*3/uL (ref 1.7–7.7)
Neutrophils Relative %: 57 %
Platelets: 248 10*3/uL (ref 150–400)
RBC: 4.64 MIL/uL (ref 3.87–5.11)
RDW: 13.9 % (ref 11.5–15.5)
WBC: 6.2 10*3/uL (ref 4.0–10.5)
nRBC: 0 % (ref 0.0–0.2)

## 2021-08-05 MED ORDER — LUTETIUM LU 177 DOTATATE 370 MBQ/ML IV SOLN
200.0000 | Freq: Once | INTRAVENOUS | Status: AC
  Administered 2021-08-05: 200.2 via INTRAVENOUS

## 2021-08-05 MED ORDER — PROCHLORPERAZINE EDISYLATE 10 MG/2ML IJ SOLN: 10.0000 mg | Freq: Four times a day (QID) | INTRAMUSCULAR | Status: DC | PRN

## 2021-08-05 MED ORDER — AMINO ACID RADIOPROTECTANT - L-LYSINE 2.5%/L-ARGININE 2.5% IN NS
250.0000 mL/h | INTRAVENOUS | Status: AC
  Administered 2021-08-05: 250 mL/h via INTRAVENOUS
  Filled 2021-08-05: qty 1000

## 2021-08-05 MED ORDER — SODIUM CHLORIDE 0.9 % IV SOLN: 500.0000 mL | Freq: Once | INTRAVENOUS | Status: DC

## 2021-08-05 MED ORDER — OCTREOTIDE ACETATE 30 MG IM KIT
PACK | INTRAMUSCULAR | Status: AC
  Filled 2021-08-05: qty 1

## 2021-08-05 MED ORDER — OCTREOTIDE ACETATE 30 MG IM KIT
30.0000 mg | PACK | Freq: Once | INTRAMUSCULAR | Status: AC
  Administered 2021-08-05: 30 mg via INTRAMUSCULAR

## 2021-08-05 MED ORDER — SODIUM CHLORIDE 0.9 % IV SOLN
8.0000 mg | Freq: Once | INTRAVENOUS | Status: AC
  Administered 2021-08-05: 8 mg via INTRAVENOUS
  Filled 2021-08-05: qty 4

## 2021-08-05 MED ORDER — OCTREOTIDE ACETATE 500 MCG/ML IJ SOLN: 500.0000 ug | Freq: Once | INTRAMUSCULAR | Status: DC | PRN

## 2021-08-06 ENCOUNTER — Encounter: Payer: Self-pay | Admitting: Hematology and Oncology

## 2021-08-06 ENCOUNTER — Inpatient Hospital Stay: Payer: 59 | Attending: Hematology and Oncology

## 2021-08-06 DIAGNOSIS — C7B03 Secondary carcinoid tumors of bone: Secondary | ICD-10-CM | POA: Diagnosis present

## 2021-08-06 DIAGNOSIS — C7A025 Malignant carcinoid tumor of the sigmoid colon: Secondary | ICD-10-CM | POA: Insufficient documentation

## 2021-08-06 DIAGNOSIS — C7B8 Other secondary neuroendocrine tumors: Secondary | ICD-10-CM

## 2021-08-06 LAB — CBC WITH DIFFERENTIAL (CANCER CENTER ONLY)
Abs Immature Granulocytes: 0.01 10*3/uL (ref 0.00–0.07)
Basophils Absolute: 0 10*3/uL (ref 0.0–0.1)
Basophils Relative: 1 %
Eosinophils Absolute: 0.2 10*3/uL (ref 0.0–0.5)
Eosinophils Relative: 5 %
HCT: 38.3 % (ref 36.0–46.0)
Hemoglobin: 12.9 g/dL (ref 12.0–15.0)
Immature Granulocytes: 0 %
Lymphocytes Relative: 32 %
Lymphs Abs: 1.4 10*3/uL (ref 0.7–4.0)
MCH: 27.8 pg (ref 26.0–34.0)
MCHC: 33.7 g/dL (ref 30.0–36.0)
MCV: 82.5 fL (ref 80.0–100.0)
Monocytes Absolute: 0.4 10*3/uL (ref 0.1–1.0)
Monocytes Relative: 9 %
Neutro Abs: 2.3 10*3/uL (ref 1.7–7.7)
Neutrophils Relative %: 53 %
Platelet Count: 249 10*3/uL (ref 150–400)
RBC: 4.64 MIL/uL (ref 3.87–5.11)
RDW: 13.7 % (ref 11.5–15.5)
WBC Count: 4.4 10*3/uL (ref 4.0–10.5)
nRBC: 0 % (ref 0.0–0.2)

## 2021-08-06 LAB — CMP (CANCER CENTER ONLY)
ALT: 15 U/L (ref 0–44)
AST: 16 U/L (ref 15–41)
Albumin: 4.2 g/dL (ref 3.5–5.0)
Alkaline Phosphatase: 104 U/L (ref 38–126)
Anion gap: 8 (ref 5–15)
BUN: 15 mg/dL (ref 6–20)
CO2: 25 mmol/L (ref 22–32)
Calcium: 9.7 mg/dL (ref 8.9–10.3)
Chloride: 104 mmol/L (ref 98–111)
Creatinine: 0.76 mg/dL (ref 0.44–1.00)
GFR, Estimated: >60 mL/min (ref >60)
Glucose, Bld: 130 mg/dL — ABNORMAL HIGH (ref 70–99)
Potassium: 3.8 mmol/L (ref 3.5–5.1)
Sodium: 137 mmol/L (ref 135–145)
Total Bilirubin: 0.4 mg/dL (ref 0.3–1.2)
Total Protein: 7.8 g/dL (ref 6.5–8.1)

## 2021-08-18 ENCOUNTER — Other Ambulatory Visit (HOSPITAL_COMMUNITY): Payer: Self-pay | Admitting: Physician Assistant

## 2021-08-23 ENCOUNTER — Encounter: Payer: Self-pay | Admitting: Hematology and Oncology

## 2021-08-23 NOTE — Telephone Encounter (Signed)
No entry 

## 2021-09-02 ENCOUNTER — Other Ambulatory Visit: Payer: Self-pay | Admitting: Internal Medicine

## 2021-09-02 DIAGNOSIS — F32A Depression, unspecified: Secondary | ICD-10-CM

## 2021-09-03 ENCOUNTER — Encounter: Payer: Self-pay | Admitting: *Deleted

## 2021-09-03 ENCOUNTER — Inpatient Hospital Stay: Payer: 59 | Attending: Hematology and Oncology

## 2021-09-03 ENCOUNTER — Other Ambulatory Visit: Payer: Self-pay

## 2021-09-03 ENCOUNTER — Inpatient Hospital Stay (HOSPITAL_BASED_OUTPATIENT_CLINIC_OR_DEPARTMENT_OTHER): Payer: 59 | Admitting: Hematology and Oncology

## 2021-09-03 ENCOUNTER — Inpatient Hospital Stay: Payer: 59

## 2021-09-03 VITALS — BP 165/108 | HR 84 | Temp 98.3°F | Resp 17 | Wt 184.6 lb

## 2021-09-03 DIAGNOSIS — Z79899 Other long term (current) drug therapy: Secondary | ICD-10-CM | POA: Insufficient documentation

## 2021-09-03 DIAGNOSIS — C7A025 Malignant carcinoid tumor of the sigmoid colon: Secondary | ICD-10-CM | POA: Diagnosis not present

## 2021-09-03 DIAGNOSIS — C7A8 Other malignant neuroendocrine tumors: Secondary | ICD-10-CM

## 2021-09-03 DIAGNOSIS — C7B8 Other secondary neuroendocrine tumors: Secondary | ICD-10-CM | POA: Diagnosis not present

## 2021-09-03 DIAGNOSIS — C7B03 Secondary carcinoid tumors of bone: Secondary | ICD-10-CM | POA: Diagnosis present

## 2021-09-03 LAB — CMP (CANCER CENTER ONLY)
ALT: 16 U/L (ref 0–44)
AST: 16 U/L (ref 15–41)
Albumin: 4.1 g/dL (ref 3.5–5.0)
Alkaline Phosphatase: 94 U/L (ref 38–126)
Anion gap: 5 (ref 5–15)
BUN: 10 mg/dL (ref 6–20)
CO2: 29 mmol/L (ref 22–32)
Calcium: 9.4 mg/dL (ref 8.9–10.3)
Chloride: 105 mmol/L (ref 98–111)
Creatinine: 0.81 mg/dL (ref 0.44–1.00)
GFR, Estimated: >60 mL/min (ref >60)
Glucose, Bld: 127 mg/dL — ABNORMAL HIGH (ref 70–99)
Potassium: 3.8 mmol/L (ref 3.5–5.1)
Sodium: 139 mmol/L (ref 135–145)
Total Bilirubin: 0.5 mg/dL (ref 0.3–1.2)
Total Protein: 7.3 g/dL (ref 6.5–8.1)

## 2021-09-03 LAB — CBC WITH DIFFERENTIAL (CANCER CENTER ONLY)
Abs Immature Granulocytes: 0.02 10*3/uL (ref 0.00–0.07)
Basophils Absolute: 0 10*3/uL (ref 0.0–0.1)
Basophils Relative: 0 %
Eosinophils Absolute: 0.1 10*3/uL (ref 0.0–0.5)
Eosinophils Relative: 2 %
HCT: 36.8 % (ref 36.0–46.0)
Hemoglobin: 12 g/dL (ref 12.0–15.0)
Immature Granulocytes: 0 %
Lymphocytes Relative: 25 %
Lymphs Abs: 1.2 10*3/uL (ref 0.7–4.0)
MCH: 27.5 pg (ref 26.0–34.0)
MCHC: 32.6 g/dL (ref 30.0–36.0)
MCV: 84.2 fL (ref 80.0–100.0)
Monocytes Absolute: 0.6 10*3/uL (ref 0.1–1.0)
Monocytes Relative: 12 %
Neutro Abs: 2.9 10*3/uL (ref 1.7–7.7)
Neutrophils Relative %: 61 %
Platelet Count: 212 10*3/uL (ref 150–400)
RBC: 4.37 MIL/uL (ref 3.87–5.11)
RDW: 14 % (ref 11.5–15.5)
WBC Count: 4.9 10*3/uL (ref 4.0–10.5)
nRBC: 0 % (ref 0.0–0.2)

## 2021-09-03 MED ORDER — LANREOTIDE ACETATE 120 MG/0.5ML ~~LOC~~ SOLN
120.0000 mg | Freq: Once | SUBCUTANEOUS | Status: AC
  Administered 2021-09-03: 120 mg via SUBCUTANEOUS
  Filled 2021-09-03: qty 120

## 2021-09-03 NOTE — Progress Notes (Signed)
[place] Telephone:([phone]) [phone]   [phone]  PROGRESS NOTE  Patient Care Team: Biagio Borg, MD as PCP - General  Hematological/Oncological History # Metastatic Neuroendocrine Tumor, Metastasis to the Spine 1) 08/26/2019: CT Abdomen Pelvis performed due to RUQ abdominal pain. Imaging showed a soft tissue mass abutting the right posterior eighth rib 2) 09/06/2019: CT chest performed which showed Ill-defined soft tissue nodule along the medial right posterior thoracic wall at the 8th intercostal space to the right of the spine. MRI was recommended. 3) 10/19/2019: MRI Thoracic spine showed enhancing lesions throughout the visualized spine consistent with metastatic disease. Additionally there was a 1.5 cm soft tissue nodule in the posteromedial right eighth intercostal space, more concerning for a metastasis 4) 10/25/2019: establish care with Dr. Lorenso Courier   5) 11/05/2019: attempted biopsy of soft tissue mass at right posterior 8th rib, but lesion had dissipated at time of biopsy attempt 6) 11/27/2019: PET CT scan performed showing right inguinal lymph nodes and osseous lesions, indicative of metastatic disease of unknown primary. 7) 12/11/2019: CT bone marrow biopsy showed metastatic neuroendocrine neoplasm 8) 12/27/2019: start of lanreotide 113m sub q28 days  9) 10/15/2020: NM PET (CU-64) scan showed widespread osseous and lymphatic uptake consistent with metastatic spread of neuroendocrine tumor.  This is more avid than prior. 01/20/2021: clinic visit with Dr. MCaprice Beaverdue to concern for possible mantle cell lymphoma seen on pathology review of previous biopsy 04/15/2021: Dose 1 (of 4) of lutathera treatment with nuclear medicine.  06/10/2021: Dose 2 (of 4) of lutathera treatment with nuclear medicine.  08/05/2021: Dose 3 (of 4) of lutathera treatment with nuclear medicine.  09/03/2021:anticipated Dose 4 (of 4) of lutathera treatment with nuclear medicine.    #Low Grade  Neuroendocrine Tumor 1) 2006: reportedly had rectal carcinoid tumor removed 2) 08/14/2008: patient had a flexibile sigmoidoscopy with endoscopic ultrasound which resected an 898m subepithelial lesion in rectum. Findings consistent with low grade neuroendocrine tumor. 3) 04/08/2010: repeat colonoscopy, no residual tumor. Repeat recommended in 2016.  4) 12/11/2019: metastatic recurrence found on biopsy, noted above.   #Mantle Cell Lymphoma 11/24/2020: incidental findings of low level mantle cell lymphoma on bone marrow biopsy. [facility] 01/20/2021: clinic visit with Dr. McCaprice Beaverue to concern for possible mantle cell lymphoma seen on pathology review of previous biopsy  Interval History:  Hana CaCarrithers61.o. female with medical history significant for metastatic neuroendocrine tumor of the colon who presents for a follow up visit. The patient's last visit was on 05/14/2021. In the interim since the last visit she continue on monthly lanreotide injections and received her second lutathera treatment.   On exam today Ms. CaCrislipeports she has been well in the interim since her last visit.  She notes that lately she has been feeling "a little hot" in the evening, particular when sleeping.  She notes that she does have fatigue typically after her therapy.  She does have some occasional bouts of diarrhea as well with some cramping.  Fortunately she is not having any nausea, vomiting, or diarrhea.  Her weight has dropped slightly down 7 pounds to 184 from 191 pounds.  She does occasionally have some episodes of tightness of the stomach.  Otherwise she feels well and has no additional questions comments or concerns.  A full 10 point ROS is listed below.  MEDICAL HISTORY:  Past Medical History:  Diagnosis Date   Acute bronchitis 06/06/2010   ALLERGIC RHINITIS  05/29/2007   Allergy    seasonal   Anal or rectal pain 06/23/2008   ANEMIA-IRON DEFICIENCY  05/29/2007   Anxiety    ASTHMA 05/29/2007   Asthma    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   Cancer ([place])    Neuro endocrine tumor   CONJUNCTIVITIS, ALLERGIC 11/27/2008   HEMORRHOIDS 06/16/2008   HTN (hypertension)    Hx of adenomatous colonic polyps 11/2019   NIPPLE DISCHARGE 01/21/2010   Other specified forms of hearing loss 10/08/2009   Overweight(278.02) 05/29/2007   RASH-NONVESICULAR 05/29/2007   Wheezing 06/22/2010    SURGICAL HISTORY: Past Surgical History:  Procedure Laterality Date   COLONOSCOPY      SOCIAL HISTORY: Social History   Socioeconomic History   Marital status: Married    Spouse name: Not on file   Number of children: 3   Years of education: Not on file   Highest education level: Not on file  Occupational History   Occupation: [employer]    Employer: [employer]   Occupation: customer service  Tobacco Use   Smoking status: Some Days    Packs/day: 0.50    Years: 40.00    Pack years: 20.00    Types: Cigarettes    Last attempt to quit: 10/22/2019    Years since quitting: 1.8   Smokeless tobacco: Never   Tobacco comments:    1 pack weekly.  started smoking at 61 years old  Vaping Use   Vaping Use: Never used  Substance and Sexual Activity   Alcohol use: Yes    Comment: ocassional   Drug use: No   Sexual activity: Not Currently  Other Topics Concern   Not on file  Social History Narrative   Daily caffeine use 3   Patient does not get regular exercise   Social Determinants of Health   Financial Resource Strain: Not on file  Food Insecurity: Not on file  Transportation Needs: Not on file  Physical Activity: Not on file  Stress: Not on file  Social Connections: Not on file  Intimate Partner Violence: Not on file    FAMILY HISTORY: Family History  Problem Relation Age of Onset   Allergies Sister    Diabetes Sister    Hypertension Sister    Hypertension Mother    Prostate cancer Father    Hypertension Brother     Hypertension Sister    Diabetes Paternal Aunt    Cancer Paternal Aunt        type unknown   Prostate cancer Brother    Stroke Maternal Aunt    Hypertension Maternal Aunt    Colon cancer Neg Hx    Esophageal cancer Neg Hx    Rectal cancer Neg Hx    Stomach cancer Neg Hx     ALLERGIES:  is allergic to shellfish allergy.  MEDICATIONS:  Current Outpatient Medications  Medication Sig Dispense Refill   ALPRAZolam (XANAX) 0.5 MG tablet Take 1 tablet (0.5 mg total) by mouth 2 (two) times daily as needed for anxiety or sleep. 60 tablet 2   buPROPion (WELLBUTRIN XL) 300 MG 24 hr tablet TAKE 1 TABLET(300 MG) BY MOUTH DAILY 90 tablet 1   cholecalciferol (VITAMIN D3) 25 MCG (1000 UNIT) tablet Take 1,000 Units by mouth daily.     escitalopram (LEXAPRO) 20 MG tablet Take 20 mg by mouth daily.     hydrochlorothiazide (HYDRODIURIL) 12.5 MG tablet TAKE 1 TABLET(12.5 MG) BY MOUTH DAILY 90 tablet 3   hydrocortisone 2.5 %  cream Apply topically 2 (two) times daily. 30 g 1   iron polysaccharides (NU-IRON) 150 MG capsule Take 1 capsule (150 mg total) by mouth daily. 90 capsule 1   lanreotide acetate (SOMATULINE DEPOT) 120 MG/0.5ML injection Inject 120 mg into the skin every 28 (twenty-eight) days. 0.5 mL 6   lanreotide acetate (SOMATULINE DEPOT) 120 MG/0.5ML injection Inject 120 mg into the skin every 28 (twenty-eight) days. 0.5 mL 12   loratadine (CLARITIN) 10 MG tablet Take 10 mg by mouth daily as needed for allergies.     ondansetron (ZOFRAN) 8 MG tablet Take 1 tablet (8 mg total) by mouth 2 (two) times daily as needed for nausea or vomiting. 20 tablet 0   ondansetron (ZOFRAN) 8 MG tablet Take 1 tablet (8 mg total) by mouth 2 (two) times daily as needed for nausea or vomiting. 20 tablet 0   oxyCODONE-acetaminophen (PERCOCET/ROXICET) 5-325 MG tablet Take 1 tablet by mouth every 6 (six) hours as needed for severe pain. 30 tablet 0   Turmeric 500 MG CAPS Take 500 mg by mouth daily.     vitamin B-12  (CYANOCOBALAMIN) 100 MCG tablet Take 100 mcg by mouth daily.     VITAMIN D-VITAMIN K PO Take 1 tablet by mouth daily.     zolpidem (AMBIEN) 10 MG tablet Take 1 tablet (10 mg total) by mouth at bedtime as needed for up to 30 days for sleep. 30 tablet 5   No current facility-administered medications for this visit.   Facility-Administered Medications Ordered in Other Visits  Medication Dose Route Frequency Provider Last Rate Last Admin   octreotide (SANDOSTATIN LAR) 30 MG IM injection             REVIEW OF SYSTEMS:   Constitutional: ( - ) fevers, ( - )  chills , ( - ) night sweats Eyes: ( - ) blurriness of vision, ( - ) double vision, ( - ) watery eyes Ears, nose, mouth, throat, and face: ( - ) mucositis, ( - ) sore throat Respiratory: ( - ) cough, ( - ) dyspnea, ( - ) wheezes Cardiovascular: ( - ) palpitation, ( - ) chest discomfort, ( - ) lower extremity swelling Gastrointestinal:  ( - ) nausea, ( - ) heartburn, ( - ) change in bowel habits Skin: ( - ) abnormal skin rashes Lymphatics: ( - ) new lymphadenopathy, ( - ) easy bruising Neurological: ( - ) numbness, ( - ) tingling, ( - ) new weaknesses Behavioral/Psych: ( - ) mood change, ( - ) new changes  All other systems were reviewed with the patient and are negative.  PHYSICAL EXAMINATION: ECOG PERFORMANCE STATUS: 0 - Asymptomatic  Vitals:   09/03/21 1051  BP: (!) 165/108  Pulse: 84  Resp: 17  Temp: 98.3 F (36.8 C)  SpO2: 97%    Filed Weights   09/03/21 1051  Weight: 184 lb 9.6 oz (83.7 kg)     GENERAL: well appearing middle aged [place] American female in NAD  SKIN: skin color, texture, turgor are normal, no rashes or significant lesions EYES: conjunctiva are pink and non-injected, sclera clear LUNGS: clear to auscultation and percussion with normal breathing effort HEART: regular rate & rhythm and no murmurs and no lower extremity edema Musculoskeletal: no cyanosis of digits and no clubbing  PSYCH: alert & oriented  x 3, fluent speech NEURO: no focal motor/sensory deficits  LABORATORY DATA:  I have reviewed the data as listed CBC Latest Ref Rng & Units 09/03/2021 08/06/2021  08/05/2021  WBC 4.0 - 10.5 K/uL 4.9 4.4 6.2  Hemoglobin 12.0 - 15.0 g/dL 12.0 12.9 12.9  Hematocrit 36.0 - 46.0 % 36.8 38.3 39.5  Platelets 150 - 400 K/uL 212 249 248    CMP Latest Ref Rng & Units 09/03/2021 08/06/2021 08/05/2021  Glucose 70 - 99 mg/dL 127(H) 130(H) 118(H)  BUN 6 - 20 mg/dL 10 15 10   Creatinine 0.44 - 1.00 mg/dL 0.81 0.76 0.73  Sodium 135 - 145 mmol/L 139 137 136  Potassium 3.5 - 5.1 mmol/L 3.8 3.8 3.4(L)  Chloride 98 - 111 mmol/L 105 104 99  CO2 22 - 32 mmol/L 29 25 28   Calcium 8.9 - 10.3 mg/dL 9.4 9.7 9.5  Total Protein 6.5 - 8.1 g/dL 7.3 7.8 7.7  Total Bilirubin 0.3 - 1.2 mg/dL 0.5 0.4 0.3  Alkaline Phos 38 - 126 U/L 94 104 98  AST 15 - 41 U/L 16 16 21   ALT 0 - 44 U/L 16 15 20     RADIOGRAPHIC STUDIES: I have personally reviewed the radiological images as listed and agreed with the findings in the report.  No results found.   ASSESSMENT & PLAN Jasnoor Walthall 61 y.o. female with medical history significant for metastatic neuroendocrine tumor of the colon who presents for a follow up visit.  After review of the labs, review the pathology, and review the PET CT imaging the findings are most consistent with metastatic neuroendocrine tumor with metastasis to the spine.  The pathology currently appears to be consistent with the neuroendocrine tumor which was previously resected from the patient's rectum.  # Metastatic Neuroendocrine Tumor, Metastasis to the Spine  --previously discussed with patient that the options would include observation with serial imaging or starting lanreotide therapy. The patient opted to start treatment --will plan for continued lanreotide 133m subq q28 days. Started therapy on 12/27/2019. This will be continued until progression or intolerance to therapy. Next dose due on 02/19/2021 --Due to  the extensive involvement of her skeleton and increased activity on her last nuclear medicine scan it was recommended that she be considered for Lu 177 dotatate treatment. Initiated treatment on 04/15/2021 with plans to complete a total of 4 treatments.  --CU-64 scan q 3 months. Last in April 2022. [facility] no evidence of lymphoma.  --Patient expressed interest in a second opinion.  [facility]  He provided excellent advice regarding options moving forward.  After hearing all the different options the patient noted she would like to continue her current lanreotide shots with consideration of increased frequency if she were to be found to be progressing Plan:  --patient undergoing treatment with Lutathera therapy. She is s/p treatment 3 of 4. They will administer 30 mg IM long acting sandostatin on days where she undergoes Lutathera treatment (q 8 weeks x 4 doses) -- We will plan to start Zometa as soon as is feasible. --Due for 4th Lutathera treatment on 09/30/21 and will return for labs, follow up visit with Dr. DLorenso Courierand lanreotide injection on 10/28/2021.   #Mantle Cell Lymphoma -- Incidental finding from prior biopsy results.  [facility] --Unclear if this represents a small low-grade clinically insignificant population of mantle cell lymphoma or a more concerning issue --[facility]--PET CT scan on 02/02/2021 showed no evidence of lymphoma.  --  We will continue to monitor and appreciate the guidance of Dr. Caprice Beaver  No orders of the defined types were placed in this encounter.  All questions were answered. The patient knows to call the clinic with any problems, questions or concerns.  I have spent a total of 30 minutes minutes of face-to-face and  non-face-to-face time, preparing to see the patient, performing a medically appropriate examination, counseling and educating the patient, ordering medications, documenting clinical information in the electronic health record, and care coordination.   [name], MD Department of Hematology/Oncology [place] at [facility] Phone: [phone] Pager: [phone] Email: [email or web address]  09/05/2021 1:02 PM

## 2021-09-05 ENCOUNTER — Encounter: Payer: Self-pay | Admitting: Hematology and Oncology

## 2021-09-06 ENCOUNTER — Telehealth: Payer: Self-pay | Admitting: Hematology and Oncology

## 2021-09-06 NOTE — Telephone Encounter (Signed)
Scheduled per 3/5 in basket, pt has been called and confirmed  ?

## 2021-09-07 LAB — CHROMOGRANIN A: Chromogranin A (ng/mL): 20.1 ng/mL (ref 0.0–101.8)

## 2021-09-09 ENCOUNTER — Other Ambulatory Visit: Payer: Self-pay | Admitting: Internal Medicine

## 2021-09-09 NOTE — Telephone Encounter (Signed)
Please refill as per office routine med refill policy (all routine meds to be refilled for 3 mo or monthly (per pt preference) up to one year from last visit, then month to month grace period for 3 mo, then further med refills will have to be denied) ? ?

## 2021-09-17 ENCOUNTER — Inpatient Hospital Stay: Payer: 59

## 2021-09-17 ENCOUNTER — Other Ambulatory Visit: Payer: Self-pay

## 2021-09-17 VITALS — BP 133/107 | HR 88 | Temp 98.2°F | Resp 18

## 2021-09-17 DIAGNOSIS — C7B8 Other secondary neuroendocrine tumors: Secondary | ICD-10-CM

## 2021-09-17 DIAGNOSIS — C7A025 Malignant carcinoid tumor of the sigmoid colon: Secondary | ICD-10-CM | POA: Diagnosis not present

## 2021-09-17 MED ORDER — SODIUM CHLORIDE 0.9 % IV SOLN: Freq: Once | INTRAVENOUS | Status: AC

## 2021-09-17 MED ORDER — ZOLEDRONIC ACID 4 MG/100ML IV SOLN
4.0000 mg | Freq: Once | INTRAVENOUS | Status: AC
  Administered 2021-09-17: 4 mg via INTRAVENOUS
  Filled 2021-09-17: qty 100

## 2021-09-17 NOTE — Patient Instructions (Signed)

## 2021-09-23 LAB — HM MAMMOGRAPHY

## 2021-09-23 NOTE — Written Directive (Addendum)
MOLECULAR IMAGING AND THERAPEUTICS WRITTEN DIRECTIVE ? ? ?PATIENT NAME: Michelle Solomon ? ?PT DOB:   10/15/1960 ?                                             ?MRN: [id number] ? ?--------------------------------------------------------------------------------------------------------------------- ? ?LUTATHERA THERAPY ? ? ?RADIOPHARMACEUTICAL:  Lutetium Louann (Lutathera)   ? ? ?PRESCRIBED DOSE FOR ADMINISTRATION:  200 mCi ? ? ?ROUTE OFADMINISTRATION:  IV ? ? ?DIAGNOSIS:  Neuroendocrine Cancer ? ? ?REFERRING PHYSICIAN: Lorenso Courier ? ? ?TREATMENT #: [id number] ? ? ?DATE OF LAST LONG LIVED SOMATOSTATIN INJECTION: 08/05/21 ? ? ?ADDITIONAL PHYSICIAN COMMENTS/NOTES: ? ? ?AUTHORIZED USER SIGNATURE & TIME STAMP: ?Rennis Golden, MD   09/23/21    10:26 AM   ?

## 2021-09-30 ENCOUNTER — Encounter: Payer: 59 | Admitting: Internal Medicine

## 2021-09-30 ENCOUNTER — Other Ambulatory Visit: Payer: Self-pay | Admitting: *Deleted

## 2021-09-30 ENCOUNTER — Ambulatory Visit (HOSPITAL_COMMUNITY)
Admission: RE | Admit: 2021-09-30 | Discharge: 2021-09-30 | Disposition: A | Discharge status: Home / Self Care | Payer: 59 | Source: Ambulatory Visit | Attending: Hematology and Oncology | Admitting: Hematology and Oncology

## 2021-09-30 DIAGNOSIS — C7B8 Other secondary neuroendocrine tumors: Secondary | ICD-10-CM

## 2021-09-30 DIAGNOSIS — C7A8 Other malignant neuroendocrine tumors: Secondary | ICD-10-CM | POA: Insufficient documentation

## 2021-09-30 LAB — CBC WITH DIFFERENTIAL/PLATELET
Abs Immature Granulocytes: 0.02 10*3/uL (ref 0.00–0.07)
Basophils Absolute: 0 10*3/uL (ref 0.0–0.1)
Basophils Relative: 0 %
Eosinophils Absolute: 0.1 10*3/uL (ref 0.0–0.5)
Eosinophils Relative: 2 %
HCT: 39.9 % (ref 36.0–46.0)
Hemoglobin: 13.1 g/dL (ref 12.0–15.0)
Immature Granulocytes: 0 %
Lymphocytes Relative: 26 %
Lymphs Abs: 1.7 10*3/uL (ref 0.7–4.0)
MCH: 27.9 pg (ref 26.0–34.0)
MCHC: 32.8 g/dL (ref 30.0–36.0)
MCV: 84.9 fL (ref 80.0–100.0)
Monocytes Absolute: 0.8 10*3/uL (ref 0.1–1.0)
Monocytes Relative: 12 %
Neutro Abs: 3.9 10*3/uL (ref 1.7–7.7)
Neutrophils Relative %: 60 %
Platelets: 282 10*3/uL (ref 150–400)
RBC: 4.7 MIL/uL (ref 3.87–5.11)
RDW: 14.3 % (ref 11.5–15.5)
WBC: 6.5 10*3/uL (ref 4.0–10.5)
nRBC: 0 % (ref 0.0–0.2)

## 2021-09-30 LAB — COMPREHENSIVE METABOLIC PANEL
ALT: 36 U/L (ref 0–44)
AST: 28 U/L (ref 15–41)
Albumin: 4.3 g/dL (ref 3.5–5.0)
Alkaline Phosphatase: 96 U/L (ref 38–126)
Anion gap: 6 (ref 5–15)
BUN: 15 mg/dL (ref 6–20)
CO2: 29 mmol/L (ref 22–32)
Calcium: 9.2 mg/dL (ref 8.9–10.3)
Chloride: 101 mmol/L (ref 98–111)
Creatinine, Ser: 0.75 mg/dL (ref 0.44–1.00)
GFR, Estimated: >60 mL/min (ref >60)
Glucose, Bld: 135 mg/dL — ABNORMAL HIGH (ref 70–99)
Potassium: 3.6 mmol/L (ref 3.5–5.1)
Sodium: 136 mmol/L (ref 135–145)
Total Bilirubin: 1 mg/dL (ref 0.3–1.2)
Total Protein: 8.4 g/dL — ABNORMAL HIGH (ref 6.5–8.1)

## 2021-09-30 MED ORDER — ONDANSETRON HCL 8 MG PO TABS: 8.0000 mg | ORAL_TABLET | Freq: Two times a day (BID) | ORAL | 0 refills | Status: DC | PRN

## 2021-09-30 MED ORDER — LUTETIUM LU 177 DOTATATE 370 MBQ/ML IV SOLN
200.0000 | Freq: Once | INTRAVENOUS | Status: AC
  Administered 2021-09-30: 200.6 via INTRAVENOUS

## 2021-09-30 MED ORDER — SODIUM CHLORIDE 0.9 % IV SOLN: 500.0000 mL | Freq: Once | INTRAVENOUS | Status: DC

## 2021-09-30 MED ORDER — LANREOTIDE ACETATE 120 MG/0.5ML ~~LOC~~ SOLN
SUBCUTANEOUS | Status: AC
  Filled 2021-09-30: qty 120

## 2021-09-30 MED ORDER — SODIUM CHLORIDE 0.9 % IV SOLN
8.0000 mg | Freq: Once | INTRAVENOUS | Status: AC
  Administered 2021-09-30: 8 mg via INTRAVENOUS
  Filled 2021-09-30: qty 4

## 2021-09-30 MED ORDER — OCTREOTIDE ACETATE 30 MG IM KIT: 30.0000 mg | PACK | Freq: Once | INTRAMUSCULAR | Status: DC

## 2021-09-30 MED ORDER — OCTREOTIDE ACETATE 500 MCG/ML IJ SOLN: 500.0000 ug | Freq: Once | INTRAMUSCULAR | Status: DC | PRN

## 2021-09-30 MED ORDER — PROCHLORPERAZINE EDISYLATE 10 MG/2ML IJ SOLN: 10.0000 mg | Freq: Four times a day (QID) | INTRAMUSCULAR | Status: DC | PRN

## 2021-09-30 MED ORDER — AMINO ACID RADIOPROTECTANT - L-LYSINE 2.5%/L-ARGININE 2.5% IN NS
250.0000 mL/h | INTRAVENOUS | Status: AC
  Administered 2021-09-30: 250 mL/h via INTRAVENOUS
  Filled 2021-09-30: qty 1000

## 2021-09-30 MED ORDER — LANREOTIDE ACETATE 120 MG/0.5ML ~~LOC~~ SOLN
120.0000 mg | Freq: Once | SUBCUTANEOUS | Status: AC
  Administered 2021-09-30: 120 mg via SUBCUTANEOUS

## 2021-09-30 NOTE — Progress Notes (Signed)
NUCLEAR MEDICINE LUTATHERA ADMINISTRATION ?TECHNIQUE: ?Infusion: The nuclear medicine technologist and I personally verified the dose activity ([203] mCi) to be delivered as specified in the written directive (200 mCi), and verified the patient identification via 2 separate methods.  20 gauge IV were started in the antecubital veins. Anti-emetics were administered by nursing staff. Amino acid renal protection was initiated 30 minutes prior to Lu 177 DOTATATE (Lutathera) infusion and continued continuously for 4 hours. Lutathera infusion was administered over 30 minutes.   ?  ?The total administered dose was [200.6] mCi Lu 177 DOTATATE. ?  ?The entire IV tubing, venocatheter, stopcock and syringes was removed in total, placed in a disposal bag and sent for assay of the residual activity, which will be reported at a later time in our EMR by the physics staff. Pressure was applied to the venipuncture sites, and a compression bandage placed. Radiation Safety personnel were present to perform the discharge survey, as detailed on their documentation. ?  ?Patient received 120 mg long-acting lanreotide injection 4 hours after Lutathera effusion in the nuclear medicine department.  ?RADIOPHARMACEUTICALS: ?  ?[200.6] mCi Lu 177 DOTATATE  ?FINDINGS: ?Diagnosis: [Metastatic neuroendocrine tumor.] ?  ?Current Infusion: [4] ?  ?Planned Infusions: [4] ?  ?Patient reports [minimal] interval symptoms following therapy.  Patient reports some intermittent fatigue and RIGHT shoulder pain.  The patient's most recent blood counts were reviewed and remains a good candidate to proceed with Lutathera. The patient was situated in an infusion suite and administered Lutathera as above. Patient will follow-up with referring oncologist for interval serum laboratories (CBC and CMP) in approximately 4 weeks.  ?  ?  ?Patient received 30 mg IM long-acting Sandostatin injection 4 hours after Lutathera effusion in the nuclear medicine department.  ?   ?IMPRESSION: ?[Fourth and final] EH 209 [id number] treatment for metastatic neuroendocrine tumor. The patient tolerated the infusion well. The patient will return in 6 to 8 weeks for DOTATATE PET scan and final consult.  ?

## 2021-09-30 NOTE — Progress Notes (Signed)
Amino Acids Started  ?

## 2021-10-05 ENCOUNTER — Telehealth: Payer: Self-pay

## 2021-10-05 NOTE — Telephone Encounter (Signed)
FMLA paperwork received via FAX. Turned paperwork in to [place] team for review. ?

## 2021-10-08 ENCOUNTER — Telehealth: Payer: Self-pay | Admitting: *Deleted

## 2021-10-08 NOTE — Telephone Encounter (Signed)
[place] authorization of release and FMLA paperwork [place] received 10/06/2021 for Michelle Solomon Post ([email or web address]) to complete and sign and also complete employee sections of form.[name] Kitchen   ?

## 2021-10-11 NOTE — Telephone Encounter (Signed)
Reached Michelle Solomon [phone]) for leave information and status of e-mail.   ?"Leave paperwork is a "renewal of current intermittent leave that ends 10/24/2021.  Complete same as previously done with 1-4 days per month needed for appointments and 1-4 days for stomach pain flare ups.  Could you re-send the e-mail." ? ?Re-sent paperwork and ROI to patient.  Provider sections completed today currently to designated mail bin for collaborative pickup for provider review and signature.  Awaiting employee sections of form and signed ROI.    ?

## 2021-10-15 NOTE — Telephone Encounter (Signed)
Connected with Tanganyika Seier [phone] (home) to check status of employee sections of paperwork and ROI.  "I sent them.   From: Markeda Lindor [email or web address]> ?Date: Tue, Oct 12, 2021 at 1:16 PM ?Subject: Re: Secure Fw: Secure Fw: Send data from [id number] 10/08/2021 17:22 ?To: [email or web address] Secure" ? ?Provided correct E-mail [email or web address] to resend.  ? ?

## 2021-10-15 NOTE — Telephone Encounter (Signed)
Paperwork successfully faxed.  Original mailed to patient address on file. ?[address] ?[place] [address] ? ?No further activity needed or performed by this nurse. ?

## 2021-10-26 ENCOUNTER — Other Ambulatory Visit: Payer: Self-pay

## 2021-10-26 ENCOUNTER — Inpatient Hospital Stay: Payer: 59

## 2021-10-26 ENCOUNTER — Inpatient Hospital Stay (HOSPITAL_BASED_OUTPATIENT_CLINIC_OR_DEPARTMENT_OTHER): Payer: 59 | Admitting: Hematology and Oncology

## 2021-10-26 ENCOUNTER — Inpatient Hospital Stay: Payer: 59 | Attending: Hematology and Oncology

## 2021-10-26 VITALS — HR 99 | Temp 96.0°F | Resp 18 | Wt 185.4 lb

## 2021-10-26 DIAGNOSIS — C7A8 Other malignant neuroendocrine tumors: Secondary | ICD-10-CM | POA: Diagnosis not present

## 2021-10-26 DIAGNOSIS — C7A025 Malignant carcinoid tumor of the sigmoid colon: Secondary | ICD-10-CM | POA: Insufficient documentation

## 2021-10-26 DIAGNOSIS — Z79899 Other long term (current) drug therapy: Secondary | ICD-10-CM | POA: Diagnosis not present

## 2021-10-26 DIAGNOSIS — C7B8 Other secondary neuroendocrine tumors: Secondary | ICD-10-CM

## 2021-10-26 DIAGNOSIS — C7B03 Secondary carcinoid tumors of bone: Secondary | ICD-10-CM | POA: Diagnosis present

## 2021-10-26 LAB — CBC WITH DIFFERENTIAL (CANCER CENTER ONLY)
Abs Immature Granulocytes: 0.01 10*3/uL (ref 0.00–0.07)
Basophils Absolute: 0 10*3/uL (ref 0.0–0.1)
Basophils Relative: 0 %
Eosinophils Absolute: 0.2 10*3/uL (ref 0.0–0.5)
Eosinophils Relative: 3 %
HCT: 38.6 % (ref 36.0–46.0)
Hemoglobin: 12.6 g/dL (ref 12.0–15.0)
Immature Granulocytes: 0 %
Lymphocytes Relative: 24 %
Lymphs Abs: 1.2 10*3/uL (ref 0.7–4.0)
MCH: 27.9 pg (ref 26.0–34.0)
MCHC: 32.6 g/dL (ref 30.0–36.0)
MCV: 85.4 fL (ref 80.0–100.0)
Monocytes Absolute: 0.7 10*3/uL (ref 0.1–1.0)
Monocytes Relative: 14 %
Neutro Abs: 2.9 10*3/uL (ref 1.7–7.7)
Neutrophils Relative %: 59 %
Platelet Count: 226 10*3/uL (ref 150–400)
RBC: 4.52 MIL/uL (ref 3.87–5.11)
RDW: 14.2 % (ref 11.5–15.5)
WBC Count: 4.9 10*3/uL (ref 4.0–10.5)
nRBC: 0 % (ref 0.0–0.2)

## 2021-10-26 LAB — CMP (CANCER CENTER ONLY)
ALT: 17 U/L (ref 0–44)
AST: 15 U/L (ref 15–41)
Albumin: 4.3 g/dL (ref 3.5–5.0)
Alkaline Phosphatase: 79 U/L (ref 38–126)
Anion gap: 7 (ref 5–15)
BUN: 14 mg/dL (ref 6–20)
CO2: 31 mmol/L (ref 22–32)
Calcium: 9.6 mg/dL (ref 8.9–10.3)
Chloride: 102 mmol/L (ref 98–111)
Creatinine: 0.72 mg/dL (ref 0.44–1.00)
GFR, Estimated: >60 mL/min (ref >60)
Glucose, Bld: 116 mg/dL — ABNORMAL HIGH (ref 70–99)
Potassium: 3.8 mmol/L (ref 3.5–5.1)
Sodium: 140 mmol/L (ref 135–145)
Total Bilirubin: 0.3 mg/dL (ref 0.3–1.2)
Total Protein: 7.8 g/dL (ref 6.5–8.1)

## 2021-10-26 MED ORDER — LANREOTIDE ACETATE 120 MG/0.5ML ~~LOC~~ SOLN
120.0000 mg | Freq: Once | SUBCUTANEOUS | Status: AC
  Administered 2021-10-26: 120 mg via SUBCUTANEOUS
  Filled 2021-10-26: qty 120

## 2021-10-26 NOTE — Progress Notes (Signed)
?[place] ?[phone]   [phone] ? ?PROGRESS NOTE ? ?Patient Care Team: ?Biagio Borg, MD as PCP - General ? ?Hematological/Oncological History ?# Metastatic Neuroendocrine Tumor, Metastasis to the Spine ?1) 08/26/2019: CT Abdomen Pelvis performed due to RUQ abdominal pain. Imaging showed a soft tissue mass abutting the right posterior eighth rib ?2) 09/06/2019: CT chest performed which showed Ill-defined soft tissue nodule along the medial right posterior thoracic wall at the 8th intercostal space to the right of the spine. MRI was recommended. ?3) 10/19/2019: MRI Thoracic spine showed enhancing lesions throughout the visualized spine consistent with metastatic disease. Additionally there was a 1.5 cm soft tissue nodule in the posteromedial right eighth intercostal space, more concerning for a metastasis ?4) 10/25/2019: establish care with Dr. Lorenso Courier   ?5) 11/05/2019: attempted biopsy of soft tissue mass at right posterior 8th rib, but lesion had dissipated at time of biopsy attempt ?6) 11/27/2019: PET CT scan performed showing right inguinal lymph nodes and osseous lesions, indicative of metastatic disease of unknown primary. ?7) 12/11/2019: CT bone marrow biopsy showed metastatic neuroendocrine neoplasm ?8) 12/27/2019: start of lanreotide 152m sub q28 days  ?9) 10/15/2020: NM PET (CU-64) scan showed widespread osseous and lymphatic uptake consistent with metastatic spread of neuroendocrine tumor.  This is more avid than prior. ?01/20/2021: clinic visit with Dr. MCaprice Beaverdue to concern for possible mantle cell lymphoma seen on pathology review of previous biopsy ?04/15/2021: Dose 1 (of 4) of lutathera treatment with nuclear medicine.  ?06/10/2021: Dose 2 (of 4) of lutathera treatment with nuclear medicine.  ?08/05/2021: Dose 3 (of 4) of lutathera treatment with nuclear medicine.  ?09/03/2021:Dose 4 (of 4) of lutathera treatment with nuclear medicine.  ?  ?#Low Grade Neuroendocrine  Tumor ?1) 2006: reportedly had rectal carcinoid tumor removed ?2) 08/14/2008: patient had a flexibile sigmoidoscopy with endoscopic ultrasound which resected an 826m subepithelial lesion in rectum. Findings consistent with low grade neuroendocrine tumor. ?3) 04/08/2010: repeat colonoscopy, no residual tumor. Repeat recommended in 2016.  ?4) 12/11/2019: metastatic recurrence found on biopsy, noted above.  ? ?#Mantle Cell Lymphoma ?11/24/2020: incidental findings of low level mantle cell lymphoma on bone marrow biopsy. Noted on outside review of pathology at [facility] ?01/20/2021: clinic visit with Dr. McCaprice Beaverue to concern for possible mantle cell lymphoma seen on pathology review of previous biopsy ? ?Interval History:  ?Michelle [age]Severs89.o. female with medical history significant for metastatic neuroendocrine tumor of the colon who presents for a follow up visit. The patient's last visit was on 07/09/2021. In the interim since the last visit she continue on monthly lanreotide injections and received her second lutathera treatment.  ? ?On exam today Ms. CaBergtholdeports she has been well overall in the interim since her last treatment.  She reports that her last treatment quite well with Dr. EdIlda Foil She notes that she did receive her first Anetta treatment and that it gave her muscle cramps and soreness throughout her body for a few days.  She notes that she has been having on and off back pain which comes and goes periodically.  She notes that her energy levels have been low on the whole.  She had an interesting situation where the lump developed on her back but subsequently receded.  Over the last few weeks she has just felt "tired".  Otherwise she denies any fevers, chills, sweats, nausea, vomiting or diarrhea.  Full 10 point ROS is listed below.  A full 10 point  ROS is listed below. ? ?MEDICAL HISTORY:  ?Past Medical History:  ?Diagnosis Date  ? Acute bronchitis 06/06/2010  ? ALLERGIC RHINITIS  05/29/2007  ? Allergy   ? seasonal  ? Anal or rectal pain 06/23/2008  ? ANEMIA-IRON DEFICIENCY 05/29/2007  ? Anxiety   ? ASTHMA 05/29/2007  ? Asthma   ? 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  ? Cancer [facility])   ? Neuro endocrine tumor  ? CONJUNCTIVITIS, ALLERGIC 11/27/2008  ? HEMORRHOIDS 06/16/2008  ? HTN (hypertension)   ? Hx of adenomatous colonic polyps 11/2019  ? NIPPLE DISCHARGE 01/21/2010  ? Other specified forms of hearing loss 10/08/2009  ? Overweight(278.02) 05/29/2007  ? RASH-NONVESICULAR 05/29/2007  ? Wheezing 06/22/2010  ? ? ?SURGICAL HISTORY: ?Past Surgical History:  ?Procedure Laterality Date  ? COLONOSCOPY    ? ? ?SOCIAL HISTORY: ?Social History  ? ?Socioeconomic History  ? Marital status: Married  ?  Spouse name: Not on file  ? Number of children: 3  ? Years of education: Not on file  ? Highest education level: Not on file  ?Occupational History  ? Occupation: CARD REPLACEMEN  ?  Employer: [employer]  ? Occupation: customer service  ?Tobacco Use  ? Smoking status: Some Days  ?  Packs/day: 0.50  ?  Years: 40.00  ?  Pack years: 20.00  ?  Types: Cigarettes  ?  Last attempt to quit: 10/22/2019  ?  Years since quitting: 2.0  ? Smokeless tobacco: Never  ? Tobacco comments:  ?  1 pack weekly.  started smoking at 61 years old  ?Vaping Use  ? Vaping Use: Never used  ?Substance and Sexual Activity  ? Alcohol use: Yes  ?  Comment: ocassional  ? Drug use: No  ? Sexual activity: Not Currently  ?Other Topics Concern  ? Not on file  ?Social History Narrative  ? Daily caffeine use 3  ? Patient does not get regular exercise  ? ?Social Determinants of Health  ? ?Financial Resource Strain: Not on file  ?Food Insecurity: Not on file  ?Transportation Needs: Not on file  ?Physical Activity: Not on file  ?Stress: Not on file  ?Social Connections: Not on file  ?Intimate Partner Violence: Not on file  ? ? ?FAMILY HISTORY: ?Family History  ?Problem Relation Age of Onset  ? Allergies Sister   ? Diabetes Sister   ?  Hypertension Sister   ? Hypertension Mother   ? Prostate cancer Father   ? Hypertension Brother   ? Hypertension Sister   ? Diabetes Paternal Aunt   ? Cancer Paternal Aunt   ?     type unknown  ? Prostate cancer Brother   ? Stroke Maternal Aunt   ? Hypertension Maternal Aunt   ? Colon cancer Neg Hx   ? Esophageal cancer Neg Hx   ? Rectal cancer Neg Hx   ? Stomach cancer Neg Hx   ? ? ?ALLERGIES:  is allergic to shellfish allergy. ? ?MEDICATIONS:  ?Current Outpatient Medications  ?Medication Sig Dispense Refill  ? hydrochlorothiazide (HYDRODIURIL) 12.5 MG tablet TAKE 1 TABLET(12.5 MG) BY MOUTH DAILY 30 tablet 0  ? ALPRAZolam (XANAX) 0.5 MG tablet Take 1 tablet (0.5 mg total) by mouth 2 (two) times daily as needed for anxiety or sleep. 60 tablet 2  ? buPROPion (WELLBUTRIN XL) 300 MG 24 hr tablet TAKE 1 TABLET(300 MG) BY MOUTH DAILY 90 tablet 1  ? cholecalciferol (VITAMIN D3) 25 MCG (1000 UNIT) tablet Take 1,000 Units by  mouth daily.    ? escitalopram (LEXAPRO) 20 MG tablet Take 20 mg by mouth daily.    ? hydrocortisone 2.5 % cream Apply topically 2 (two) times daily. 30 g 1  ? iron polysaccharides (NU-IRON) 150 MG capsule Take 1 capsule (150 mg total) by mouth daily. 90 capsule 1  ? lanreotide acetate (SOMATULINE DEPOT) 120 MG/0.5ML injection Inject 120 mg into the skin every 28 (twenty-eight) days. 0.5 mL 6  ? lanreotide acetate (SOMATULINE DEPOT) 120 MG/0.5ML injection Inject 120 mg into the skin every 28 (twenty-eight) days. 0.5 mL 12  ? loratadine (CLARITIN) 10 MG tablet Take 10 mg by mouth daily as needed for allergies.    ? ondansetron (ZOFRAN) 8 MG tablet Take 1 tablet (8 mg total) by mouth 2 (two) times daily as needed for nausea or vomiting. 20 tablet 0  ? ondansetron (ZOFRAN) 8 MG tablet Take 1 tablet (8 mg total) by mouth 2 (two) times daily as needed for nausea or vomiting. 20 tablet 0  ? ondansetron (ZOFRAN) 8 MG tablet Take 1 tablet (8 mg total) by mouth 2 (two) times daily as needed for nausea or  vomiting. 20 tablet 0  ? oxyCODONE-acetaminophen (PERCOCET/ROXICET) 5-325 MG tablet Take 1 tablet by mouth every 6 (six) hours as needed for severe pain. 30 tablet 0  ? Turmeric 500 MG CAPS Take 500 mg by mouth daily.    ?

## 2021-10-27 ENCOUNTER — Telehealth: Payer: Self-pay | Admitting: Hematology and Oncology

## 2021-10-27 NOTE — Telephone Encounter (Signed)
Scheduled per 4/25 los, message has been left with pt ?

## 2021-11-26 ENCOUNTER — Inpatient Hospital Stay: Payer: 59

## 2021-11-26 ENCOUNTER — Ambulatory Visit (HOSPITAL_COMMUNITY)
Admission: RE | Admit: 2021-11-26 | Discharge: 2021-11-26 | Disposition: A | Discharge status: Home / Self Care | Payer: 59 | Source: Ambulatory Visit | Attending: Hematology and Oncology | Admitting: Hematology and Oncology

## 2021-11-26 ENCOUNTER — Inpatient Hospital Stay: Payer: 59 | Attending: Hematology and Oncology

## 2021-11-26 ENCOUNTER — Other Ambulatory Visit: Payer: Self-pay

## 2021-11-26 VITALS — BP 153/101 | HR 97 | Temp 98.3°F | Resp 18

## 2021-11-26 DIAGNOSIS — C7B8 Other secondary neuroendocrine tumors: Secondary | ICD-10-CM | POA: Insufficient documentation

## 2021-11-26 DIAGNOSIS — C7A025 Malignant carcinoid tumor of the sigmoid colon: Secondary | ICD-10-CM | POA: Diagnosis present

## 2021-11-26 DIAGNOSIS — Z79899 Other long term (current) drug therapy: Secondary | ICD-10-CM | POA: Diagnosis not present

## 2021-11-26 DIAGNOSIS — C7B03 Secondary carcinoid tumors of bone: Secondary | ICD-10-CM | POA: Diagnosis present

## 2021-11-26 LAB — CBC WITH DIFFERENTIAL (CANCER CENTER ONLY)
Abs Immature Granulocytes: 0.01 10*3/uL (ref 0.00–0.07)
Basophils Absolute: 0 10*3/uL (ref 0.0–0.1)
Basophils Relative: 0 %
Eosinophils Absolute: 0.2 10*3/uL (ref 0.0–0.5)
Eosinophils Relative: 3 %
HCT: 37 % (ref 36.0–46.0)
Hemoglobin: 12.5 g/dL (ref 12.0–15.0)
Immature Granulocytes: 0 %
Lymphocytes Relative: 24 %
Lymphs Abs: 1.3 10*3/uL (ref 0.7–4.0)
MCH: 28.6 pg (ref 26.0–34.0)
MCHC: 33.8 g/dL (ref 30.0–36.0)
MCV: 84.7 fL (ref 80.0–100.0)
Monocytes Absolute: 0.6 10*3/uL (ref 0.1–1.0)
Monocytes Relative: 11 %
Neutro Abs: 3.2 10*3/uL (ref 1.7–7.7)
Neutrophils Relative %: 62 %
Platelet Count: 244 10*3/uL (ref 150–400)
RBC: 4.37 MIL/uL (ref 3.87–5.11)
RDW: 13.5 % (ref 11.5–15.5)
WBC Count: 5.3 10*3/uL (ref 4.0–10.5)
nRBC: 0 % (ref 0.0–0.2)

## 2021-11-26 LAB — CMP (CANCER CENTER ONLY)
ALT: 13 U/L (ref 0–44)
AST: 15 U/L (ref 15–41)
Albumin: 4.4 g/dL (ref 3.5–5.0)
Alkaline Phosphatase: 76 U/L (ref 38–126)
Anion gap: 7 (ref 5–15)
BUN: 11 mg/dL (ref 6–20)
CO2: 30 mmol/L (ref 22–32)
Calcium: 10.3 mg/dL (ref 8.9–10.3)
Chloride: 102 mmol/L (ref 98–111)
Creatinine: 0.76 mg/dL (ref 0.44–1.00)
GFR, Estimated: >60 mL/min (ref >60)
Glucose, Bld: 118 mg/dL — ABNORMAL HIGH (ref 70–99)
Potassium: 3.7 mmol/L (ref 3.5–5.1)
Sodium: 139 mmol/L (ref 135–145)
Total Bilirubin: 0.4 mg/dL (ref 0.3–1.2)
Total Protein: 7.8 g/dL (ref 6.5–8.1)

## 2021-11-26 MED ORDER — COPPER CU 64 DOTATATE 1 MCI/ML IV SOLN
4.0000 | Freq: Once | INTRAVENOUS | Status: AC
  Administered 2021-11-26: 4 via INTRAVENOUS

## 2021-11-26 MED ORDER — LANREOTIDE ACETATE 120 MG/0.5ML ~~LOC~~ SOLN
120.0000 mg | Freq: Once | SUBCUTANEOUS | Status: AC
  Administered 2021-11-26: 120 mg via SUBCUTANEOUS
  Filled 2021-11-26: qty 120

## 2021-11-26 NOTE — Progress Notes (Signed)
Patients B/p was 153/101 she stated she does have B/p problems and did not take her Meds for B/P today

## 2021-12-02 ENCOUNTER — Telehealth: Payer: Self-pay | Admitting: *Deleted

## 2021-12-02 LAB — CHROMOGRANIN A: Chromogranin A (ng/mL): 23.2 ng/mL (ref 0.0–101.8)

## 2021-12-02 NOTE — Telephone Encounter (Signed)
TCT patient regarding recent scan.  Spoke with her and informed her that her scan results ae very good, that there has been a dramatic reduction in tumor activity. Dr. Lorenso Courier states that she is having a very strong response to current therapy. Pt pleased. Advised that we will continue her current monthly lanreotide injections and that she has an MD visit in July. Pt voiced understanding.

## 2021-12-02 NOTE — Telephone Encounter (Signed)
-----   Message from Orson Slick, MD sent at 11/28/2021  4:25 PM EDT ----- Please let Michelle Solomon know that her scan showed an excellent response to treatment.  There is reduced activity in the tumor in virtually all locations.  This is a strong response to therapy. ----- Message ----- From: Interface, Rad Results In Sent: 11/26/2021   3:08 PM EDT To: Orson Slick, MD

## 2021-12-16 ENCOUNTER — Telehealth: Payer: Self-pay | Admitting: *Deleted

## 2021-12-16 NOTE — Telephone Encounter (Signed)
Received call from pt requesting to schedule a visit with Dr. Lorenso Courier to go over her scan report from 11/26/21.  Even though the scan was reviewed with her on 12/02/21, she wants to go over it in person with Dr. Lorenso Courier. Scheduled her for 12/17/21 @ 3:20pm

## 2021-12-17 ENCOUNTER — Inpatient Hospital Stay: Payer: 59 | Attending: Hematology and Oncology | Admitting: Hematology and Oncology

## 2021-12-17 ENCOUNTER — Other Ambulatory Visit: Payer: Self-pay

## 2021-12-17 VITALS — BP 147/97 | HR 105 | Temp 97.1°F | Resp 19 | Wt 182.2 lb

## 2021-12-17 DIAGNOSIS — C7A8 Other malignant neuroendocrine tumors: Secondary | ICD-10-CM

## 2021-12-17 DIAGNOSIS — C7B03 Secondary carcinoid tumors of bone: Secondary | ICD-10-CM | POA: Diagnosis present

## 2021-12-17 DIAGNOSIS — C7B8 Other secondary neuroendocrine tumors: Secondary | ICD-10-CM

## 2021-12-17 DIAGNOSIS — C7A025 Malignant carcinoid tumor of the sigmoid colon: Secondary | ICD-10-CM | POA: Diagnosis present

## 2021-12-17 DIAGNOSIS — Z79899 Other long term (current) drug therapy: Secondary | ICD-10-CM | POA: Diagnosis not present

## 2021-12-24 ENCOUNTER — Inpatient Hospital Stay: Payer: 59

## 2021-12-24 ENCOUNTER — Other Ambulatory Visit: Payer: Self-pay

## 2021-12-24 VITALS — BP 148/92 | HR 77 | Temp 98.1°F | Resp 18

## 2021-12-24 DIAGNOSIS — C7A025 Malignant carcinoid tumor of the sigmoid colon: Secondary | ICD-10-CM | POA: Diagnosis not present

## 2021-12-24 DIAGNOSIS — C7B8 Other secondary neuroendocrine tumors: Secondary | ICD-10-CM

## 2021-12-24 LAB — CBC WITH DIFFERENTIAL (CANCER CENTER ONLY)
Abs Immature Granulocytes: 0.06 10*3/uL (ref 0.00–0.07)
Basophils Absolute: 0 10*3/uL (ref 0.0–0.1)
Basophils Relative: 0 %
Eosinophils Absolute: 0.1 10*3/uL (ref 0.0–0.5)
Eosinophils Relative: 2 %
HCT: 36.3 % (ref 36.0–46.0)
Hemoglobin: 12.2 g/dL (ref 12.0–15.0)
Immature Granulocytes: 1 %
Lymphocytes Relative: 30 %
Lymphs Abs: 1.5 10*3/uL (ref 0.7–4.0)
MCH: 28.4 pg (ref 26.0–34.0)
MCHC: 33.6 g/dL (ref 30.0–36.0)
MCV: 84.6 fL (ref 80.0–100.0)
Monocytes Absolute: 0.6 10*3/uL (ref 0.1–1.0)
Monocytes Relative: 11 %
Neutro Abs: 2.7 10*3/uL (ref 1.7–7.7)
Neutrophils Relative %: 56 %
Platelet Count: 221 10*3/uL (ref 150–400)
RBC: 4.29 MIL/uL (ref 3.87–5.11)
RDW: 13.8 % (ref 11.5–15.5)
WBC Count: 4.9 10*3/uL (ref 4.0–10.5)
nRBC: 0 % (ref 0.0–0.2)

## 2021-12-24 LAB — CMP (CANCER CENTER ONLY)
ALT: 13 U/L (ref 0–44)
AST: 14 U/L — ABNORMAL LOW (ref 15–41)
Albumin: 4 g/dL (ref 3.5–5.0)
Alkaline Phosphatase: 68 U/L (ref 38–126)
Anion gap: 6 (ref 5–15)
BUN: 10 mg/dL (ref 6–20)
CO2: 30 mmol/L (ref 22–32)
Calcium: 10.1 mg/dL (ref 8.9–10.3)
Chloride: 104 mmol/L (ref 98–111)
Creatinine: 0.75 mg/dL (ref 0.44–1.00)
GFR, Estimated: >60 mL/min (ref >60)
Glucose, Bld: 95 mg/dL (ref 70–99)
Potassium: 3.6 mmol/L (ref 3.5–5.1)
Sodium: 140 mmol/L (ref 135–145)
Total Bilirubin: 0.3 mg/dL (ref 0.3–1.2)
Total Protein: 7.2 g/dL (ref 6.5–8.1)

## 2021-12-24 MED ORDER — SODIUM CHLORIDE 0.9 % IV SOLN: Freq: Once | INTRAVENOUS | Status: AC

## 2021-12-24 MED ORDER — ZOLEDRONIC ACID 4 MG/100ML IV SOLN
4.0000 mg | Freq: Once | INTRAVENOUS | Status: AC
  Administered 2021-12-24: 4 mg via INTRAVENOUS
  Filled 2021-12-24: qty 100

## 2021-12-24 MED ORDER — LANREOTIDE ACETATE 120 MG/0.5ML ~~LOC~~ SOLN
120.0000 mg | Freq: Once | SUBCUTANEOUS | Status: AC
  Administered 2021-12-24: 120 mg via SUBCUTANEOUS
  Filled 2021-12-24: qty 120

## 2021-12-25 ENCOUNTER — Encounter: Payer: Self-pay | Admitting: Hematology and Oncology

## 2022-01-21 ENCOUNTER — Inpatient Hospital Stay: Payer: 59

## 2022-01-21 ENCOUNTER — Other Ambulatory Visit: Payer: Self-pay | Admitting: Hematology and Oncology

## 2022-01-21 ENCOUNTER — Inpatient Hospital Stay: Payer: 59 | Attending: Hematology and Oncology | Admitting: Hematology and Oncology

## 2022-01-21 ENCOUNTER — Other Ambulatory Visit: Payer: Self-pay

## 2022-01-21 VITALS — BP 149/98 | HR 85 | Temp 97.9°F | Resp 17 | Ht 63.0 in | Wt 182.7 lb

## 2022-01-21 DIAGNOSIS — M899 Disorder of bone, unspecified: Secondary | ICD-10-CM | POA: Diagnosis not present

## 2022-01-21 DIAGNOSIS — C7B03 Secondary carcinoid tumors of bone: Secondary | ICD-10-CM | POA: Diagnosis present

## 2022-01-21 DIAGNOSIS — C7A025 Malignant carcinoid tumor of the sigmoid colon: Secondary | ICD-10-CM | POA: Insufficient documentation

## 2022-01-21 DIAGNOSIS — C8319 Mantle cell lymphoma, extranodal and solid organ sites: Secondary | ICD-10-CM | POA: Diagnosis not present

## 2022-01-21 DIAGNOSIS — C7B8 Other secondary neuroendocrine tumors: Secondary | ICD-10-CM

## 2022-01-21 DIAGNOSIS — Z79899 Other long term (current) drug therapy: Secondary | ICD-10-CM | POA: Diagnosis not present

## 2022-01-21 DIAGNOSIS — C7A8 Other malignant neuroendocrine tumors: Secondary | ICD-10-CM

## 2022-01-21 LAB — CBC WITH DIFFERENTIAL (CANCER CENTER ONLY)
Abs Immature Granulocytes: 0.01 10*3/uL (ref 0.00–0.07)
Basophils Absolute: 0 10*3/uL (ref 0.0–0.1)
Basophils Relative: 0 %
Eosinophils Absolute: 0.1 10*3/uL (ref 0.0–0.5)
Eosinophils Relative: 3 %
HCT: 34.9 % — ABNORMAL LOW (ref 36.0–46.0)
Hemoglobin: 11.7 g/dL — ABNORMAL LOW (ref 12.0–15.0)
Immature Granulocytes: 0 %
Lymphocytes Relative: 39 %
Lymphs Abs: 1.9 10*3/uL (ref 0.7–4.0)
MCH: 28.3 pg (ref 26.0–34.0)
MCHC: 33.5 g/dL (ref 30.0–36.0)
MCV: 84.5 fL (ref 80.0–100.0)
Monocytes Absolute: 0.5 10*3/uL (ref 0.1–1.0)
Monocytes Relative: 11 %
Neutro Abs: 2.2 10*3/uL (ref 1.7–7.7)
Neutrophils Relative %: 47 %
Platelet Count: 227 10*3/uL (ref 150–400)
RBC: 4.13 MIL/uL (ref 3.87–5.11)
RDW: 13.4 % (ref 11.5–15.5)
WBC Count: 4.7 10*3/uL (ref 4.0–10.5)
nRBC: 0 % (ref 0.0–0.2)

## 2022-01-21 LAB — CMP (CANCER CENTER ONLY)
ALT: 11 U/L (ref 0–44)
AST: 14 U/L — ABNORMAL LOW (ref 15–41)
Albumin: 3.9 g/dL (ref 3.5–5.0)
Alkaline Phosphatase: 58 U/L (ref 38–126)
Anion gap: 5 (ref 5–15)
BUN: 13 mg/dL (ref 6–20)
CO2: 29 mmol/L (ref 22–32)
Calcium: 9.4 mg/dL (ref 8.9–10.3)
Chloride: 106 mmol/L (ref 98–111)
Creatinine: 0.7 mg/dL (ref 0.44–1.00)
GFR, Estimated: >60 mL/min (ref >60)
Glucose, Bld: 122 mg/dL — ABNORMAL HIGH (ref 70–99)
Potassium: 3.7 mmol/L (ref 3.5–5.1)
Sodium: 140 mmol/L (ref 135–145)
Total Bilirubin: 0.5 mg/dL (ref 0.3–1.2)
Total Protein: 6.9 g/dL (ref 6.5–8.1)

## 2022-01-21 MED ORDER — LANREOTIDE ACETATE 120 MG/0.5ML ~~LOC~~ SOLN
120.0000 mg | Freq: Once | SUBCUTANEOUS | Status: AC
  Administered 2022-01-21: 120 mg via SUBCUTANEOUS
  Filled 2022-01-21: qty 120

## 2022-01-21 NOTE — Progress Notes (Signed)
[facility] [phone]   [phone]  PROGRESS NOTE  Patient Care Team: Corwin Levins, MD as PCP - General  Hematological/Oncological History # Metastatic Neuroendocrine Tumor, Metastasis to the Spine 1) 08/26/2019: CT Abdomen Pelvis performed due to RUQ abdominal pain. Imaging showed a soft tissue mass abutting the right posterior eighth rib 2) 09/06/2019: CT chest performed which showed Ill-defined soft tissue nodule along the medial right posterior thoracic wall at the 8th intercostal space to the right of the spine. MRI was recommended. 3) 10/19/2019: MRI Thoracic spine showed enhancing lesions throughout the visualized spine consistent with metastatic disease. Additionally there was a 1.5 cm soft tissue nodule in the posteromedial right eighth intercostal space, more concerning for a metastasis 4) 10/25/2019: establish care with Dr. Leonides Schanz   5) 11/05/2019: attempted biopsy of soft tissue mass at right posterior 8th rib, but lesion had dissipated at time of biopsy attempt 6) 11/27/2019: PET CT scan performed showing right inguinal lymph nodes and osseous lesions, indicative of metastatic disease of unknown primary. 7) 12/11/2019: CT bone marrow biopsy showed metastatic neuroendocrine neoplasm 8) 12/27/2019: start of lanreotide 120mg  sub q28 days  9) 10/15/2020: NM PET (CU-64) scan showed widespread osseous and lymphatic uptake consistent with metastatic spread of neuroendocrine tumor.  This is more avid than prior. 01/20/2021: clinic visit with Dr. Nolen Mu due to concern for possible mantle cell lymphoma seen on pathology review of previous biopsy 04/15/2021: Dose 1 (of 4) of lutathera treatment with nuclear medicine.  06/10/2021: Dose 2 (of 4) of lutathera treatment with nuclear medicine.  08/05/2021: Dose 3 (of 4) of lutathera treatment with nuclear medicine.  09/03/2021:Dose 4 (of 4) of lutathera treatment with nuclear medicine.  11/26/2021: NM Copper Dotatate Scan  shows dramatic reduction in number and activity of skeletal metastasis.   #Low Grade Neuroendocrine Tumor 1) 2006: reportedly had rectal carcinoid tumor removed 2) 08/14/2008: patient had a flexibile sigmoidoscopy with endoscopic ultrasound which resected an 8mm, subepithelial lesion in rectum. Findings consistent with low grade neuroendocrine tumor. 3) 04/08/2010: repeat colonoscopy, no residual tumor. Repeat recommended in 2016.  4) 12/11/2019: metastatic recurrence found on biopsy, noted above. 5) 11/26/2021:  NM PET CU-64 showed a dramatic reduction in the number and activity as skeletal metastases.  #Mantle Cell Lymphoma 11/24/2020: incidental findings of low level mantle cell lymphoma on bone marrow biopsy. Noted on outside review of pathology at [facility].  01/20/2021: clinic visit with Dr. Nolen Mu due to concern for possible mantle cell lymphoma seen on pathology review of previous biopsy  Interval History:  Michelle Solomon 61 y.o. female with medical history significant for metastatic neuroendocrine tumor of the colon who presents for a follow up visit. The patient's last visit was on 12/17/2021. In the interim since the last visit she has continued on monthly lanreotide.   On exam today Ms. Rojo reports she has been doing well overall interim since her last visit.  She reports that she does have very rare twinges of pain in her back.  She does have headache periodically about 2-3 times per month.  She notes that Advil helps with this.  She notes her appetite is been good and her weight has been stable.  She has been tolerating her lanreotide shots quite well.  She notes that she has Percocet on her medicine list but almost never uses it.  She reports that she feels like she is at her baseline level of health and is willing and able to proceed with  lanreotide shots at this time.  She denies any bone or back pain.  Otherwise she denies any fevers, chills, sweats, nausea, vomiting or  diarrhea.  Full 10 point ROS is listed below.  A full 10 point ROS is listed below.  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   Cancer (HCC)    Neuro endocrine tumor   CONJUNCTIVITIS, ALLERGIC 11/27/2008   HEMORRHOIDS 06/16/2008   HTN (hypertension)    Hx of adenomatous colonic polyps 11/2019   NIPPLE DISCHARGE 01/21/2010   Other specified forms of hearing loss 10/08/2009   Overweight(278.02) 05/29/2007   RASH-NONVESICULAR 05/29/2007   Wheezing 06/22/2010    SURGICAL HISTORY: Past Surgical History:  Procedure Laterality Date   COLONOSCOPY      SOCIAL HISTORY: Social History   Socioeconomic History   Marital status: Married    Spouse name: Not on file   Number of children: 3   Years of education: Not on file   Highest education level: Not on file  Occupational History   Occupation: CARD REPLACEMEN    Employer: [employer]   Occupation: customer service  Tobacco Use   Smoking status: Some Days    Packs/day: 0.50    Years: 40.00    Total pack years: 20.00    Types: Cigarettes    Last attempt to quit: 10/22/2019    Years since quitting: 2.2   Smokeless tobacco: Never   Tobacco comments:    1 pack weekly.  started smoking at 61 years old  Vaping Use   Vaping Use: Never used  Substance and Sexual Activity   Alcohol use: Yes    Comment: ocassional   Drug use: No   Sexual activity: Not Currently  Other Topics Concern   Not on file  Social History Narrative   Daily caffeine use 3   Patient does not get regular exercise   Social Determinants of Health   Financial Resource Strain: Not on file  Food Insecurity: Not on file  Transportation Needs: Not on file  Physical Activity: Not on file  Stress:  Not on file  Social Connections: Not on file  Intimate Partner Violence: Not on file    FAMILY HISTORY: Family History  Problem Relation Age of Onset   Allergies Sister    Diabetes Sister    Hypertension Sister    Hypertension Mother    Prostate cancer Father    Hypertension Brother    Hypertension Sister    Diabetes Paternal Aunt    Cancer Paternal Aunt        type unknown   Prostate cancer Brother    Stroke Maternal Aunt    Hypertension Maternal Aunt    Colon cancer Neg Hx    Esophageal cancer Neg Hx    Rectal cancer Neg Hx    Stomach cancer Neg Hx     ALLERGIES:  is allergic to shellfish allergy.  MEDICATIONS:  Current Outpatient Medications  Medication Sig Dispense Refill   hydrochlorothiazide (HYDRODIURIL) 12.5 MG tablet TAKE 1 TABLET(12.5 MG) BY MOUTH DAILY 30 tablet 0   ALPRAZolam (XANAX) 0.5 MG tablet Take 1 tablet (0.5 mg total) by mouth 2 (two) times daily as needed for anxiety  or sleep. 60 tablet 2   buPROPion (WELLBUTRIN XL) 300 MG 24 hr tablet TAKE 1 TABLET(300 MG) BY MOUTH DAILY 90 tablet 1   cholecalciferol (VITAMIN D3) 25 MCG (1000 UNIT) tablet Take 1,000 Units by mouth daily.     escitalopram (LEXAPRO) 20 MG tablet Take 20 mg by mouth daily.     hydrocortisone 2.5 % cream Apply topically 2 (two) times daily. 30 g 1   iron polysaccharides (NU-IRON) 150 MG capsule Take 1 capsule (150 mg total) by mouth daily. 90 capsule 1   lanreotide acetate (SOMATULINE DEPOT) 120 MG/0.5ML injection Inject 120 mg into the skin every 28 (twenty-eight) days. 0.5 mL 6   lanreotide acetate (SOMATULINE DEPOT) 120 MG/0.5ML injection Inject 120 mg into the skin every 28 (twenty-eight) days. 0.5 mL 12   loratadine (CLARITIN) 10 MG tablet Take 10 mg by mouth daily as needed for allergies.     ondansetron (ZOFRAN) 8 MG tablet Take 1 tablet (8 mg total) by mouth 2 (two) times daily as needed for nausea or vomiting. 20 tablet 0   ondansetron (ZOFRAN) 8 MG tablet Take 1 tablet (8 mg  total) by mouth 2 (two) times daily as needed for nausea or vomiting. 20 tablet 0   ondansetron (ZOFRAN) 8 MG tablet Take 1 tablet (8 mg total) by mouth 2 (two) times daily as needed for nausea or vomiting. 20 tablet 0   oxyCODONE-acetaminophen (PERCOCET/ROXICET) 5-325 MG tablet Take 1 tablet by mouth every 6 (six) hours as needed for severe pain. 30 tablet 0   Turmeric 500 MG CAPS Take 500 mg by mouth daily.     vitamin B-12 (CYANOCOBALAMIN) 100 MCG tablet Take 100 mcg by mouth daily.     VITAMIN D-VITAMIN K PO Take 1 tablet by mouth daily.     zolpidem (AMBIEN) 10 MG tablet Take 1 tablet (10 mg total) by mouth at bedtime as needed for up to 30 days for sleep. 30 tablet 5   No current facility-administered medications for this visit.   Facility-Administered Medications Ordered in Other Visits  Medication Dose Route Frequency Provider Last Rate Last Admin   octreotide (SANDOSTATIN LAR) 30 MG IM injection             REVIEW OF SYSTEMS:   Constitutional: ( - ) fevers, ( - )  chills , ( - ) night sweats Eyes: ( - ) blurriness of vision, ( - ) double vision, ( - ) watery eyes Ears, nose, mouth, throat, and face: ( - ) mucositis, ( - ) sore throat Respiratory: ( - ) cough, ( - ) dyspnea, ( - ) wheezes Cardiovascular: ( - ) palpitation, ( - ) chest discomfort, ( - ) lower extremity swelling Gastrointestinal:  ( - ) nausea, ( - ) heartburn, ( - ) change in bowel habits Skin: ( - ) abnormal skin rashes Lymphatics: ( - ) new lymphadenopathy, ( - ) easy bruising Neurological: ( - ) numbness, ( - ) tingling, ( - ) new weaknesses Behavioral/Psych: ( - ) mood change, ( - ) new changes  All other systems were reviewed with the patient and are negative.  PHYSICAL EXAMINATION: ECOG PERFORMANCE STATUS: 0 - Asymptomatic  Vitals:   01/21/22 0905  BP: (!) 149/98  Pulse: 85  Resp: 17  Temp: 97.9 F (36.6 C)  SpO2: 100%     Filed Weights   01/21/22 0905  Weight: 182 lb 11.2 oz (82.9 kg)       GENERAL: well  appearing middle aged [place] American female in NAD  SKIN: skin color, texture, turgor are normal, no rashes or significant lesions EYES: conjunctiva are pink and non-injected, sclera clear LUNGS: clear to auscultation and percussion with normal breathing effort HEART: regular rate & rhythm and no murmurs and no lower extremity edema Musculoskeletal: no cyanosis of digits and no clubbing  PSYCH: alert & oriented x 3, fluent speech NEURO: no focal motor/sensory deficits  LABORATORY DATA:  I have reviewed the data as listed    Latest Ref Rng & Units 01/21/2022    8:49 AM 12/24/2021    8:58 AM 11/26/2021    9:42 AM  CBC  WBC 4.0 - 10.5 K/uL 4.7  4.9  5.3   Hemoglobin 12.0 - 15.0 g/dL 16.1  09.6  04.5   Hematocrit 36.0 - 46.0 % 34.9  36.3  37.0   Platelets 150 - 400 K/uL 227  221  244        Latest Ref Rng & Units 01/21/2022    8:49 AM 12/24/2021    8:58 AM 11/26/2021    9:42 AM  CMP  Glucose 70 - 99 mg/dL 409  95  811   BUN 6 - 20 mg/dL 13  10  11    Creatinine 0.44 - 1.00 mg/dL 9.14  7.82  9.56   Sodium 135 - 145 mmol/L 140  140  139   Potassium 3.5 - 5.1 mmol/L 3.7  3.6  3.7   Chloride 98 - 111 mmol/L 106  104  102   CO2 22 - 32 mmol/L 29  30  30    Calcium 8.9 - 10.3 mg/dL 9.4  21.3  08.6   Total Protein 6.5 - 8.1 g/dL 6.9  7.2  7.8   Total Bilirubin 0.3 - 1.2 mg/dL 0.5  0.3  0.4   Alkaline Phos 38 - 126 U/L 58  68  76   AST 15 - 41 U/L 14  14  15    ALT 0 - 44 U/L 11  13  13      RADIOGRAPHIC STUDIES: I have personally reviewed the radiological images as listed and agreed with the findings in the report.  No results found.   ASSESSMENT & PLAN Michelle Solomon 61 y.o. female with medical history significant for metastatic neuroendocrine tumor of the colon who presents for a follow up visit.  After review of the labs, review the pathology, and review the PET CT imaging the findings are most consistent with metastatic neuroendocrine tumor with metastasis  to the spine.  The pathology currently appears to be consistent with the neuroendocrine tumor which was previously resected from the patient's rectum.  # Metastatic Neuroendocrine Tumor, Metastasis to the Spine  --previously discussed with patient that the options would include observation with serial imaging or starting lanreotide therapy. The patient opted to start treatment --will plan for continued lanreotide 120mg  subq q28 days. Started therapy on 12/27/2019. This will be continued until progression or intolerance to therapy. Next dose due on 02/19/2021 --Due to the extensive involvement of her skeleton and increased activity on her last nuclear medicine scan it was recommended that she be considered for Lu 177 dotatate treatment. Initiated treatment on 04/15/2021 with plans to complete a total of 4 treatments.  --CU-64 scan q 3 months. Last in April 2022. Completed PET CT scan at [facility] on 02/02/2021, no evidence of lymphoma.  --Patient expressed interest in a second opinion.  She saw Dr. Lattie Corns at [facility].  He  provided excellent advice regarding options moving forward.  After hearing all the different options the patient noted she would like to continue her current lanreotide shots with consideration of increased frequency if she were to be found to be progressing --patient underwent treatment with Lutathera therapy. She is s/p treatment 4 of 4. They administered 30 mg IM long acting sandostatin on days where she undergoes Lutathera treatment (q 8 weeks x 4 doses). Completed the 4th Lutathera treatment on 09/30/21  Plan:  --continue lanreotide 120 mg q 28 days with q 3 month zometa --repeat scan to be performed November 2023 --Last Zometa infusion completed on 12/24/2021.  Next due in September 2023 -- Labs today show white blood cell count 4.7, hemoglobin 1.7, MCV 84.5, and platelets of 227.  Creatinine 0.7 --RTC in 4 weeks with interval continued monthly lanreotide  injections.   #Mantle Cell Lymphoma -- Incidental finding from prior biopsy results.  This was detected on secondary pathological review at [facility] when the patient went for second opinion --Unclear if this represents a small low-grade clinically insignificant population of mantle cell lymphoma or a more concerning issue --Patient following with Dr. Nolen Mu at [facility] --PET CT scan on 02/02/2021 showed no evidence of lymphoma.  --We will continue to monitor and appreciate the guidance of Dr. Nolen Mu  No orders of the defined types were placed in this encounter.  All questions were answered. The patient knows to call the clinic with any problems, questions or concerns.  I have spent a total of 30 minutes minutes of face-to-face and non-face-to-face time, preparing to see the patient, performing a medically appropriate examination, counseling and educating the patient, ordering medications, documenting clinical information in the electronic health record, and care coordination.   Ulysees Barns, MD Department of Hematology/Oncology [facility] Phone: [phone] Pager: [phone] Email: [email or web address]  01/22/2022 11:12 AM

## 2022-01-22 ENCOUNTER — Encounter: Payer: Self-pay | Admitting: Hematology and Oncology

## 2022-01-24 ENCOUNTER — Telehealth: Payer: Self-pay | Admitting: Hematology and Oncology

## 2022-01-24 LAB — CHROMOGRANIN A: Chromogranin A (ng/mL): 21 ng/mL (ref 0.0–101.8)

## 2022-01-24 NOTE — Telephone Encounter (Signed)
Scheduled per 7/22 in basket, pt has been called and confirmed

## 2022-02-18 ENCOUNTER — Inpatient Hospital Stay: Payer: 59 | Attending: Hematology and Oncology

## 2022-02-18 ENCOUNTER — Inpatient Hospital Stay: Payer: 59

## 2022-02-18 ENCOUNTER — Other Ambulatory Visit: Payer: Self-pay

## 2022-02-18 VITALS — BP 151/103 | HR 69 | Temp 98.5°F | Resp 16

## 2022-02-18 DIAGNOSIS — C7A025 Malignant carcinoid tumor of the sigmoid colon: Secondary | ICD-10-CM | POA: Insufficient documentation

## 2022-02-18 DIAGNOSIS — Z79899 Other long term (current) drug therapy: Secondary | ICD-10-CM | POA: Insufficient documentation

## 2022-02-18 DIAGNOSIS — C7B8 Other secondary neuroendocrine tumors: Secondary | ICD-10-CM

## 2022-02-18 DIAGNOSIS — C7B03 Secondary carcinoid tumors of bone: Secondary | ICD-10-CM | POA: Diagnosis present

## 2022-02-18 LAB — CMP (CANCER CENTER ONLY)
ALT: 17 U/L (ref 0–44)
AST: 16 U/L (ref 15–41)
Albumin: 4 g/dL (ref 3.5–5.0)
Alkaline Phosphatase: 59 U/L (ref 38–126)
Anion gap: 3 — ABNORMAL LOW (ref 5–15)
BUN: 11 mg/dL (ref 6–20)
CO2: 29 mmol/L (ref 22–32)
Calcium: 9 mg/dL (ref 8.9–10.3)
Chloride: 108 mmol/L (ref 98–111)
Creatinine: 0.66 mg/dL (ref 0.44–1.00)
GFR, Estimated: >60 mL/min (ref >60)
Glucose, Bld: 109 mg/dL — ABNORMAL HIGH (ref 70–99)
Potassium: 3.6 mmol/L (ref 3.5–5.1)
Sodium: 140 mmol/L (ref 135–145)
Total Bilirubin: 0.3 mg/dL (ref 0.3–1.2)
Total Protein: 6.7 g/dL (ref 6.5–8.1)

## 2022-02-18 LAB — CBC WITH DIFFERENTIAL (CANCER CENTER ONLY)
Abs Immature Granulocytes: 0.01 10*3/uL (ref 0.00–0.07)
Basophils Absolute: 0 10*3/uL (ref 0.0–0.1)
Basophils Relative: 0 %
Eosinophils Absolute: 0.1 10*3/uL (ref 0.0–0.5)
Eosinophils Relative: 3 %
HCT: 33.9 % — ABNORMAL LOW (ref 36.0–46.0)
Hemoglobin: 11.5 g/dL — ABNORMAL LOW (ref 12.0–15.0)
Immature Granulocytes: 0 %
Lymphocytes Relative: 31 %
Lymphs Abs: 1.6 10*3/uL (ref 0.7–4.0)
MCH: 28.5 pg (ref 26.0–34.0)
MCHC: 33.9 g/dL (ref 30.0–36.0)
MCV: 84.1 fL (ref 80.0–100.0)
Monocytes Absolute: 0.5 10*3/uL (ref 0.1–1.0)
Monocytes Relative: 9 %
Neutro Abs: 2.9 10*3/uL (ref 1.7–7.7)
Neutrophils Relative %: 57 %
Platelet Count: 227 10*3/uL (ref 150–400)
RBC: 4.03 MIL/uL (ref 3.87–5.11)
RDW: 13.8 % (ref 11.5–15.5)
WBC Count: 5.2 10*3/uL (ref 4.0–10.5)
nRBC: 0 % (ref 0.0–0.2)

## 2022-02-18 MED ORDER — LANREOTIDE ACETATE 120 MG/0.5ML ~~LOC~~ SOLN
120.0000 mg | Freq: Once | SUBCUTANEOUS | Status: AC
  Administered 2022-02-18: 120 mg via SUBCUTANEOUS
  Filled 2022-02-18: qty 120

## 2022-02-22 LAB — CHROMOGRANIN A: Chromogranin A (ng/mL): 23.4 ng/mL (ref 0.0–101.8)

## 2022-03-14 ENCOUNTER — Other Ambulatory Visit: Payer: Self-pay

## 2022-03-14 DIAGNOSIS — F419 Anxiety disorder, unspecified: Secondary | ICD-10-CM

## 2022-03-14 MED ORDER — BUPROPION HCL ER (XL) 300 MG PO TB24: ORAL_TABLET | ORAL | 1 refills | Status: DC

## 2022-03-18 ENCOUNTER — Inpatient Hospital Stay: Payer: 59

## 2022-03-18 ENCOUNTER — Other Ambulatory Visit: Payer: Self-pay

## 2022-03-18 ENCOUNTER — Inpatient Hospital Stay: Payer: 59 | Attending: Hematology and Oncology

## 2022-03-18 VITALS — BP 155/98 | HR 75 | Resp 17

## 2022-03-18 DIAGNOSIS — C7B03 Secondary carcinoid tumors of bone: Secondary | ICD-10-CM | POA: Diagnosis present

## 2022-03-18 DIAGNOSIS — C7B8 Other secondary neuroendocrine tumors: Secondary | ICD-10-CM

## 2022-03-18 DIAGNOSIS — Z79899 Other long term (current) drug therapy: Secondary | ICD-10-CM | POA: Insufficient documentation

## 2022-03-18 DIAGNOSIS — C7A025 Malignant carcinoid tumor of the sigmoid colon: Secondary | ICD-10-CM | POA: Insufficient documentation

## 2022-03-18 LAB — CBC WITH DIFFERENTIAL (CANCER CENTER ONLY)
Abs Immature Granulocytes: 0.02 10*3/uL (ref 0.00–0.07)
Basophils Absolute: 0 10*3/uL (ref 0.0–0.1)
Basophils Relative: 0 %
Eosinophils Absolute: 0.1 10*3/uL (ref 0.0–0.5)
Eosinophils Relative: 2 %
HCT: 38.1 % (ref 36.0–46.0)
Hemoglobin: 12.8 g/dL (ref 12.0–15.0)
Immature Granulocytes: 0 %
Lymphocytes Relative: 35 %
Lymphs Abs: 1.9 10*3/uL (ref 0.7–4.0)
MCH: 28.4 pg (ref 26.0–34.0)
MCHC: 33.6 g/dL (ref 30.0–36.0)
MCV: 84.5 fL (ref 80.0–100.0)
Monocytes Absolute: 0.7 10*3/uL (ref 0.1–1.0)
Monocytes Relative: 12 %
Neutro Abs: 2.8 10*3/uL (ref 1.7–7.7)
Neutrophils Relative %: 51 %
Platelet Count: 230 10*3/uL (ref 150–400)
RBC: 4.51 MIL/uL (ref 3.87–5.11)
RDW: 13.9 % (ref 11.5–15.5)
WBC Count: 5.5 10*3/uL (ref 4.0–10.5)
nRBC: 0 % (ref 0.0–0.2)

## 2022-03-18 LAB — CMP (CANCER CENTER ONLY)
ALT: 79 U/L — ABNORMAL HIGH (ref 0–44)
AST: 33 U/L (ref 15–41)
Albumin: 3.8 g/dL (ref 3.5–5.0)
Alkaline Phosphatase: 59 U/L (ref 38–126)
Anion gap: 7 (ref 5–15)
BUN: 13 mg/dL (ref 6–20)
CO2: 28 mmol/L (ref 22–32)
Calcium: 9.4 mg/dL (ref 8.9–10.3)
Chloride: 105 mmol/L (ref 98–111)
Creatinine: 0.63 mg/dL (ref 0.44–1.00)
GFR, Estimated: >60 mL/min (ref >60)
Glucose, Bld: 114 mg/dL — ABNORMAL HIGH (ref 70–99)
Potassium: 3.4 mmol/L — ABNORMAL LOW (ref 3.5–5.1)
Sodium: 140 mmol/L (ref 135–145)
Total Bilirubin: 0.5 mg/dL (ref 0.3–1.2)
Total Protein: 7.2 g/dL (ref 6.5–8.1)

## 2022-03-18 MED ORDER — LANREOTIDE ACETATE 120 MG/0.5ML ~~LOC~~ SOLN
120.0000 mg | Freq: Once | SUBCUTANEOUS | Status: AC
  Administered 2022-03-18: 120 mg via SUBCUTANEOUS
  Filled 2022-03-18: qty 120

## 2022-03-18 MED ORDER — SODIUM CHLORIDE 0.9 % IV SOLN: Freq: Once | INTRAVENOUS | Status: AC

## 2022-03-18 MED ORDER — ZOLEDRONIC ACID 4 MG/100ML IV SOLN
4.0000 mg | Freq: Once | INTRAVENOUS | Status: AC
  Administered 2022-03-18: 4 mg via INTRAVENOUS
  Filled 2022-03-18: qty 100

## 2022-03-18 NOTE — Patient Instructions (Signed)
[facility] ONCOLOGY  Discharge Instructions: Thank you for choosing [facility] to provide your oncology and hematology care.   If you have a lab appointment with the [facility], please go directly to the [facility] and check in at the registration area.   Wear comfortable clothing and clothing appropriate for easy access to any Portacath or PICC line.   We strive to give you quality time with your provider. You may need to reschedule your appointment if you arrive late (15 or more minutes).  Arriving late affects you and other patients whose appointments are after yours.  Also, if you miss three or more appointments without notifying the office, you may be dismissed from the clinic at the provider's discretion.      For prescription refill requests, have your pharmacy contact our office and allow 72 hours for refills to be completed.    Today you received the following chemotherapy and/or immunotherapy agent: Zometa      To help prevent nausea and vomiting after your treatment, we encourage you to take your nausea medication as directed.  BELOW ARE SYMPTOMS THAT SHOULD BE REPORTED IMMEDIATELY: *FEVER GREATER THAN 100.4 F (38 C) OR HIGHER *CHILLS OR SWEATING *NAUSEA AND VOMITING THAT IS NOT CONTROLLED WITH YOUR NAUSEA MEDICATION *UNUSUAL SHORTNESS OF BREATH *UNUSUAL BRUISING OR BLEEDING *URINARY PROBLEMS (pain or burning when urinating, or frequent urination) *BOWEL PROBLEMS (unusual diarrhea, constipation, pain near the anus) TENDERNESS IN MOUTH AND THROAT WITH OR WITHOUT PRESENCE OF ULCERS (sore throat, sores in mouth, or a toothache) UNUSUAL RASH, SWELLING OR PAIN  UNUSUAL VAGINAL DISCHARGE OR ITCHING   Items with * indicate a potential emergency and should be followed up as soon as possible or go to the Emergency Department if any problems should occur.  Please show the CHEMOTHERAPY ALERT CARD or IMMUNOTHERAPY ALERT CARD at check-in to the  Emergency Department and triage nurse.  Should you have questions after your visit or need to cancel or reschedule your appointment, please contact [facility]  Dept: [phone]  and follow the prompts.  Office hours are 8:00 a.m. to 4:30 p.m. Monday - Friday. Please note that voicemails left after 4:00 p.m. may not be returned until the following business day.  We are closed weekends and major holidays. You have access to a nurse at all times for urgent questions. Please call the main number to the clinic Dept: [phone] and follow the prompts.   For any non-urgent questions, you may also contact your provider using MyChart. We now offer e-Visits for anyone 93 and older to request care online for non-urgent symptoms. For details visit [email or web address].   Also download the MyChart app! Go to the app store, search "MyChart", open the app, select [facility], and log in with your MyChart username and password.  Masks are optional in the cancer centers. If you would like for your care team to wear a mask while they are taking care of you, please let them know. For doctor visits, patients may have with them one support person who is at least 60 years old. At this time, visitors are not allowed in the infusion area.

## 2022-03-21 LAB — CHROMOGRANIN A: Chromogranin A (ng/mL): 19.8 ng/mL (ref 0.0–101.8)

## 2022-04-13 ENCOUNTER — Ambulatory Visit (INDEPENDENT_AMBULATORY_CARE_PROVIDER_SITE_OTHER): Payer: 59 | Admitting: Internal Medicine

## 2022-04-13 ENCOUNTER — Encounter: Payer: Self-pay | Admitting: Internal Medicine

## 2022-04-13 ENCOUNTER — Other Ambulatory Visit: Payer: Self-pay | Admitting: Internal Medicine

## 2022-04-13 VITALS — BP 132/86 | HR 80 | Temp 98.0°F | Ht 63.0 in | Wt 179.0 lb

## 2022-04-13 DIAGNOSIS — R3129 Other microscopic hematuria: Secondary | ICD-10-CM

## 2022-04-13 DIAGNOSIS — F419 Anxiety disorder, unspecified: Secondary | ICD-10-CM | POA: Diagnosis not present

## 2022-04-13 DIAGNOSIS — F32A Depression, unspecified: Secondary | ICD-10-CM

## 2022-04-13 DIAGNOSIS — E559 Vitamin D deficiency, unspecified: Secondary | ICD-10-CM | POA: Diagnosis not present

## 2022-04-13 DIAGNOSIS — I10 Essential (primary) hypertension: Secondary | ICD-10-CM | POA: Diagnosis not present

## 2022-04-13 DIAGNOSIS — R739 Hyperglycemia, unspecified: Secondary | ICD-10-CM

## 2022-04-13 DIAGNOSIS — G8929 Other chronic pain: Secondary | ICD-10-CM

## 2022-04-13 DIAGNOSIS — E538 Deficiency of other specified B group vitamins: Secondary | ICD-10-CM

## 2022-04-13 DIAGNOSIS — Z0001 Encounter for general adult medical examination with abnormal findings: Secondary | ICD-10-CM

## 2022-04-13 DIAGNOSIS — M545 Low back pain, unspecified: Secondary | ICD-10-CM

## 2022-04-13 LAB — URINALYSIS, ROUTINE W REFLEX MICROSCOPIC
Bilirubin Urine: NEGATIVE
Ketones, ur: NEGATIVE
Leukocytes,Ua: NEGATIVE
Nitrite: NEGATIVE
Specific Gravity, Urine: 1.015 (ref 1.000–1.030)
Urine Glucose: NEGATIVE
Urobilinogen, UA: 1 (ref 0.0–1.0)
WBC, UA: NONE SEEN (ref 0–?)
pH: 7 (ref 5.0–8.0)

## 2022-04-13 LAB — LIPID PANEL
Cholesterol: 200 mg/dL (ref 0–200)
HDL: 82.5 mg/dL (ref >39.00)
LDL Cholesterol: 102 mg/dL — ABNORMAL HIGH (ref 0–99)
NonHDL: 117.28
Total CHOL/HDL Ratio: 2
Triglycerides: 74 mg/dL (ref 0.0–149.0)
VLDL: 14.8 mg/dL (ref 0.0–40.0)

## 2022-04-13 LAB — VITAMIN B12: Vitamin B-12: 519 pg/mL (ref 211–911)

## 2022-04-13 LAB — TSH: TSH: 1.48 u[IU]/mL (ref 0.35–5.50)

## 2022-04-13 LAB — VITAMIN D 25 HYDROXY (VIT D DEFICIENCY, FRACTURES): VITD: 51.3 ng/mL (ref 30.00–100.00)

## 2022-04-13 LAB — HEMOGLOBIN A1C: Hgb A1c MFr Bld: 6.7 % — ABNORMAL HIGH (ref 4.6–6.5)

## 2022-04-13 MED ORDER — HYDROCHLOROTHIAZIDE 12.5 MG PO TABS: ORAL_TABLET | ORAL | 3 refills | Status: DC

## 2022-04-13 MED ORDER — ZOLPIDEM TARTRATE 10 MG PO TABS: 10.0000 mg | ORAL_TABLET | Freq: Every evening | ORAL | 1 refills | Status: AC | PRN

## 2022-04-13 MED ORDER — OXYCODONE-ACETAMINOPHEN 5-325 MG PO TABS: 1.0000 | ORAL_TABLET | Freq: Four times a day (QID) | ORAL | 0 refills | Status: DC | PRN

## 2022-04-13 MED ORDER — BUPROPION HCL ER (XL) 300 MG PO TB24: ORAL_TABLET | ORAL | 3 refills | Status: AC

## 2022-04-13 MED ORDER — LOSARTAN POTASSIUM 50 MG PO TABS: 50.0000 mg | ORAL_TABLET | Freq: Every day | ORAL | 3 refills | Status: DC

## 2022-04-13 NOTE — Assessment & Plan Note (Signed)
BP Readings from Last 3 Encounters:  04/13/22 132/86  03/18/22 (!) 155/98  02/18/22 (!) 151/103   Stable here but uncontroleld at home per pt to sbp 150s quite often,, pt to continue medical treatment hct 12.5, but add losartan 50 mg qd

## 2022-04-13 NOTE — Progress Notes (Signed)
Patient ID: Michelle Solomon, female   DOB: 03-04-1961, 61 y.o.   MRN: [id number]         Chief Complaint:: wellness exam and Medication Refill (Check up, discuss BP medication patient states her BP still is elevated)  , chronic lbp, anxiety depression, hyperglycemia       HPI:  Michelle Solomon is a 61 y.o. female here for wellness exam; for shingrix at the pharmacy, delcines tdap, o/w up to date                        Also Denies urinary symptoms such as dysuria, frequency, urgency, flank pain, hematuria or n/v, fever, chills.  Pt continues to have recurring LBP without change in severity, bowel or bladder change, fever, wt loss,  worsening LE pain/numbness/weakness, gait change or falls, but requires rare use of percocet prn .  Denies worsening depressive symptoms, suicidal ideation, or panic; has ongoing anxiety, not increased recently.    Pt denies polydipsia, polyuria, or new focal neuro s/s.    Pt denies fever, wt loss, night sweats, loss of appetite, or other constitutional symptoms   Wt Readings from Last 3 Encounters:  04/13/22 179 lb (81.2 kg)  01/21/22 182 lb 11.2 oz (82.9 kg)  12/17/21 182 lb 4 oz (82.7 kg)   BP Readings from Last 3 Encounters:  04/13/22 132/86  03/18/22 (!) 155/98  02/18/22 (!) 151/103   Immunization History  Administered Date(s) Administered   Influenza Split 04/05/2012   Influenza Whole 05/05/2008, 04/03/2009   PFIZER Comirnaty(Gray Top)Covid-19 Tri-Sucrose Vaccine 10/12/2019, 12/03/2019   PFIZER(Purple Top)SARS-COV-2 Vaccination 10/12/2019, 12/03/2019   Td 05/26/2008   There are no preventive care reminders to display for this patient.     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 She has a 20.00 pack-year smoking history. She has never used smokeless tobacco. She reports current alcohol use. She reports that she does not use drugs. family history includes Allergies in her sister; Cancer in her paternal aunt; Diabetes in her paternal aunt and sister; Hypertension in her brother, maternal aunt, mother, sister, and sister; Prostate cancer in her brother and father; Stroke in her maternal aunt. Allergies  Allergen Reactions   Shellfish Allergy Anaphylaxis   Current Outpatient Medications on File Prior to Visit  Medication Sig Dispense Refill   cholecalciferol (VITAMIN D3) 25 MCG (1000 UNIT) tablet Take 1,000 Units by mouth daily.     hydrocortisone 2.5 % cream Apply topically 2 (two) times daily. 30 g 1   iron polysaccharides (NU-IRON) 150 MG capsule Take 1 capsule (150 mg total) by mouth daily. 90 capsule 1   lanreotide acetate (SOMATULINE DEPOT) 120 MG/0.5ML injection Inject 120 mg into the skin every  28 (twenty-eight) days. 0.5 mL 6   lanreotide acetate (SOMATULINE DEPOT) 120 MG/0.5ML injection Inject 120 mg into the skin every 28 (twenty-eight) days. 0.5 mL 12   loratadine (CLARITIN) 10 MG tablet Take 10 mg by mouth daily as needed for allergies.     ondansetron (ZOFRAN) 8 MG tablet Take 1 tablet (8 mg total) by mouth 2 (two) times daily as needed for nausea or vomiting. 20 tablet 0   ondansetron (ZOFRAN) 8 MG tablet Take 1 tablet (8 mg total) by mouth 2 (two) times daily as needed for nausea or vomiting. 20 tablet 0   ondansetron (ZOFRAN) 8 MG  tablet Take 1 tablet (8 mg total) by mouth 2 (two) times daily as needed for nausea or vomiting. 20 tablet 0   Turmeric 500 MG CAPS Take 500 mg by mouth daily.     vitamin B-12 (CYANOCOBALAMIN) 100 MCG tablet Take 100 mcg by mouth daily.     VITAMIN D-VITAMIN K PO Take 1 tablet by mouth daily.     Current Facility-Administered Medications on File Prior to Visit  Medication Dose Route Frequency Provider Last Rate Last Admin   octreotide (SANDOSTATIN LAR) 30 MG IM injection                 ROS:  All others reviewed and negative.  Objective        PE:  BP 132/86 (BP Location: Right Arm, Patient Position: Sitting, Cuff Size: Normal)   Pulse 80   Temp 98 F (36.7 C) (Oral)   Ht '5\' 3"'$  (1.6 m)   Wt 179 lb (81.2 kg)   LMP 10/26/2017   SpO2 98%   BMI 31.71 kg/m                 Constitutional: Pt appears in NAD               HENT: Head: NCAT.                Right Ear: External ear normal.                 Left Ear: External ear normal.                Eyes: . Pupils are equal, round, and reactive to light. Conjunctivae and EOM are normal               Nose: without d/c or deformity               Neck: Neck supple. Gross normal ROM               Cardiovascular: Normal rate and regular rhythm.                 Pulmonary/Chest: Effort normal and breath sounds without rales or wheezing.                Abd:  Soft, NT, ND, + BS, no organomegaly               Neurological: Pt is alert. At baseline orientation, motor grossly intact               Skin: Skin is warm. No rashes, no other new lesions, LE edema - none               Psychiatric: Pt behavior is normal without agitation   Micro: none  Cardiac tracings I have personally interpreted today:  none  Pertinent Radiological findings (summarize):  none   Lab Results  Component Value Date   WBC 5.5 03/18/2022   HGB 12.8 03/18/2022   HCT 38.1 03/18/2022   PLT 230 03/18/2022   GLUCOSE 114 (H) 03/18/2022   CHOL 200 04/13/2022   TRIG 74.0  04/13/2022   HDL 82.50 04/13/2022   LDLCALC 102 (H) 04/13/2022   ALT 79 (H) 03/18/2022   AST 33 03/18/2022   NA 140 03/18/2022   K 3.4 (L) 03/18/2022   CL 105 03/18/2022   CREATININE 0.63 03/18/2022   BUN 13 03/18/2022   CO2 28 03/18/2022   TSH 1.48 04/13/2022   HGBA1C 6.7 (H) 04/13/2022   Assessment/Plan:  Kailiana Yono is a 61 y.o. Black or African American [2] female with  has a past medical history of Acute bronchitis (06/06/2010), ALLERGIC RHINITIS (05/29/2007), Allergy, Anal or rectal pain (06/23/2008), ANEMIA-IRON DEFICIENCY (05/29/2007), Anxiety, ASTHMA (05/29/2007), Asthma, Benign carcinoid tumor of the rectum (05/26/2008), Cancer ([place]), CONJUNCTIVITIS, ALLERGIC (11/27/2008), HEMORRHOIDS (06/16/2008), HTN (hypertension), adenomatous colonic polyps (11/2019), NIPPLE DISCHARGE (01/21/2010), Other specified forms of hearing loss (10/08/2009), Overweight(278.02) (05/29/2007), RASH-NONVESICULAR (05/29/2007), and Wheezing (06/22/2010).  Microhematuria Noted on UA, asympt, for urology referral  Vitamin D deficiency Last vitamin D Lab Results  Component Value Date   VD25OH 51.30 04/13/2022   Stable, cont oral replacement   Hyperglycemia Lab Results  Component Value Date   HGBA1C 6.7 (H) 04/13/2022   Mild uncontrolled, pt to continue current medical treatment  - diet, wt control, excercise   Back pain Chronic stable, for prn percocet 5 325  - limit #30 per year  Anxiety and depression Stable overall, no longer takes lexapro or xanax, to continue wellbutrin xl 300 qd  HTN (hypertension) BP Readings from Last 3 Encounters:  04/13/22 132/86  03/18/22 (!) 155/98  02/18/22 (!) 151/103   Stable here but uncontroleld at home per pt to sbp 150s quite often,, pt to continue medical treatment hct 12.5, but add losartan 50 mg qd   Followup: Return in about 6 months (around 10/13/2022).  Cathlean Cower, MD 04/13/2022 3:42 PM [facility]

## 2022-04-13 NOTE — Assessment & Plan Note (Signed)
Noted on UA, asympt, for urology referral

## 2022-04-13 NOTE — Assessment & Plan Note (Signed)
Chronic stable, for prn percocet 5 325  - limit #30 per year

## 2022-04-13 NOTE — Assessment & Plan Note (Signed)
Stable overall, no longer takes lexapro or xanax, to continue wellbutrin xl 300 qd

## 2022-04-13 NOTE — Assessment & Plan Note (Signed)
Last vitamin D Lab Results  Component Value Date   VD25OH 51.30 04/13/2022   Stable, cont oral replacement

## 2022-04-13 NOTE — Patient Instructions (Addendum)
Please have your Shingrix (shingles) shots done at your local pharmacy.  Please take all new medication as prescribed - the losartan 50 mg per day  Please call for the higher dose losartan to 100 mg if the BP is still mildly elevated next week  Please continue all other medications as before, and refills have been done if requested - the HCT, percocet, and wellbutrin  Please have the pharmacy call with any other refills you may need.  Please continue your efforts at being more active, low cholesterol diet, and weight control.  You are otherwise up to date with prevention measures today.  Please keep your appointments with your specialists as you may have planned - cancer center  Please go to the LAB at the blood drawing area for the tests to be done  You will be contacted by phone if any changes need to be made immediately.  Otherwise, you will receive a letter about your results with an explanation, but please check with MyChart first.  Please remember to sign up for MyChart if you have not done so, as this will be important to you in the future with finding out test results, communicating by private email, and scheduling acute appointments online when needed.  Please make an Appointment to return in 6 months, or sooner if needed

## 2022-04-13 NOTE — Assessment & Plan Note (Signed)
Lab Results  Component Value Date   HGBA1C 6.7 (H) 04/13/2022   Mild uncontrolled, pt to continue current medical treatment  - diet, wt control, excercise

## 2022-04-15 ENCOUNTER — Inpatient Hospital Stay: Payer: 59

## 2022-04-15 ENCOUNTER — Inpatient Hospital Stay: Payer: 59 | Attending: Hematology and Oncology | Admitting: Hematology and Oncology

## 2022-04-15 VITALS — BP 141/108 | HR 95 | Temp 97.7°F | Resp 16 | Wt 179.9 lb

## 2022-04-15 DIAGNOSIS — C7B03 Secondary carcinoid tumors of bone: Secondary | ICD-10-CM | POA: Diagnosis present

## 2022-04-15 DIAGNOSIS — C7B8 Other secondary neuroendocrine tumors: Secondary | ICD-10-CM

## 2022-04-15 DIAGNOSIS — Z79899 Other long term (current) drug therapy: Secondary | ICD-10-CM | POA: Insufficient documentation

## 2022-04-15 DIAGNOSIS — C7A025 Malignant carcinoid tumor of the sigmoid colon: Secondary | ICD-10-CM | POA: Insufficient documentation

## 2022-04-15 DIAGNOSIS — C7A8 Other malignant neuroendocrine tumors: Secondary | ICD-10-CM

## 2022-04-15 LAB — CBC WITH DIFFERENTIAL (CANCER CENTER ONLY)
Abs Immature Granulocytes: 0.01 10*3/uL (ref 0.00–0.07)
Basophils Absolute: 0 10*3/uL (ref 0.0–0.1)
Basophils Relative: 0 %
Eosinophils Absolute: 0.1 10*3/uL (ref 0.0–0.5)
Eosinophils Relative: 2 %
HCT: 37.3 % (ref 36.0–46.0)
Hemoglobin: 12 g/dL (ref 12.0–15.0)
Immature Granulocytes: 0 %
Lymphocytes Relative: 37 %
Lymphs Abs: 2.2 10*3/uL (ref 0.7–4.0)
MCH: 27.3 pg (ref 26.0–34.0)
MCHC: 32.2 g/dL (ref 30.0–36.0)
MCV: 84.8 fL (ref 80.0–100.0)
Monocytes Absolute: 0.7 10*3/uL (ref 0.1–1.0)
Monocytes Relative: 12 %
Neutro Abs: 2.9 10*3/uL (ref 1.7–7.7)
Neutrophils Relative %: 49 %
Platelet Count: 254 10*3/uL (ref 150–400)
RBC: 4.4 MIL/uL (ref 3.87–5.11)
RDW: 14 % (ref 11.5–15.5)
WBC Count: 6 10*3/uL (ref 4.0–10.5)
nRBC: 0 % (ref 0.0–0.2)

## 2022-04-15 LAB — CMP (CANCER CENTER ONLY)
ALT: 19 U/L (ref 0–44)
AST: 18 U/L (ref 15–41)
Albumin: 4.2 g/dL (ref 3.5–5.0)
Alkaline Phosphatase: 61 U/L (ref 38–126)
Anion gap: 6 (ref 5–15)
BUN: 15 mg/dL (ref 6–20)
CO2: 31 mmol/L (ref 22–32)
Calcium: 9.4 mg/dL (ref 8.9–10.3)
Chloride: 104 mmol/L (ref 98–111)
Creatinine: 0.95 mg/dL (ref 0.44–1.00)
GFR, Estimated: >60 mL/min (ref >60)
Glucose, Bld: 113 mg/dL — ABNORMAL HIGH (ref 70–99)
Potassium: 3.8 mmol/L (ref 3.5–5.1)
Sodium: 141 mmol/L (ref 135–145)
Total Bilirubin: 0.4 mg/dL (ref 0.3–1.2)
Total Protein: 7.4 g/dL (ref 6.5–8.1)

## 2022-04-15 MED ORDER — LANREOTIDE ACETATE 120 MG/0.5ML ~~LOC~~ SOLN
120.0000 mg | Freq: Once | SUBCUTANEOUS | Status: AC
  Administered 2022-04-15: 120 mg via SUBCUTANEOUS
  Filled 2022-04-15: qty 120

## 2022-04-15 MED ORDER — SODIUM CHLORIDE 0.9% FLUSH: 10.0000 mL | Freq: Once | INTRAVENOUS | Status: DC | PRN

## 2022-04-15 NOTE — Progress Notes (Signed)
[facility] [phone]   [phone]  PROGRESS NOTE  Patient Care Team: Biagio Borg, MD as PCP - General  Hematological/Oncological History # Metastatic Neuroendocrine Tumor, Metastasis to the Spine 1) 08/26/2019: CT Abdomen Pelvis performed due to RUQ abdominal pain. Imaging showed a soft tissue mass abutting the right posterior eighth rib 2) 09/06/2019: CT chest performed which showed Ill-defined soft tissue nodule along the medial right posterior thoracic wall at the 8th intercostal space to the right of the spine. MRI was recommended. 3) 10/19/2019: MRI Thoracic spine showed enhancing lesions throughout the visualized spine consistent with metastatic disease. Additionally there was a 1.5 cm soft tissue nodule in the posteromedial right eighth intercostal space, more concerning for a metastasis 4) 10/25/2019: establish care with Dr. Lorenso Courier   5) 11/05/2019: attempted biopsy of soft tissue mass at right posterior 8th rib, but lesion had dissipated at time of biopsy attempt 6) 11/27/2019: PET CT scan performed showing right inguinal lymph nodes and osseous lesions, indicative of metastatic disease of unknown primary. 7) 12/11/2019: CT bone marrow biopsy showed metastatic neuroendocrine neoplasm 8) 12/27/2019: start of lanreotide 159m sub q28 days  9) 10/15/2020: NM PET (CU-64) scan showed widespread osseous and lymphatic uptake consistent with metastatic spread of neuroendocrine tumor.  This is more avid than prior. 01/20/2021: clinic visit with Dr. MCaprice Beaverdue to concern for possible mantle cell lymphoma seen on pathology review of previous biopsy 04/15/2021: Dose 1 (of 4) of lutathera treatment with nuclear medicine.  06/10/2021: Dose 2 (of 4) of lutathera treatment with nuclear medicine.  08/05/2021: Dose 3 (of 4) of lutathera treatment with nuclear medicine.  09/03/2021:Dose 4 (of 4) of lutathera treatment with nuclear medicine.  11/26/2021: NM Copper Dotatate Scan  shows dramatic reduction in number and activity of skeletal metastasis.   #Low Grade Neuroendocrine Tumor 1) 2006: reportedly had rectal carcinoid tumor removed 2) 08/14/2008: patient had a flexibile sigmoidoscopy with endoscopic ultrasound which resected an 816m subepithelial lesion in rectum. Findings consistent with low grade neuroendocrine tumor. 3) 04/08/2010: repeat colonoscopy, no residual tumor. Repeat recommended in 2016.  4) 12/11/2019: metastatic recurrence found on biopsy, noted above. 5) 11/26/2021:  NM PET CU-64 showed a dramatic reduction in the number and activity as skeletal metastases.  #Mantle Cell Lymphoma 11/24/2020: incidental findings of low level mantle cell lymphoma on bone marrow biopsy. [facility] 01/20/2021: clinic visit with Dr. McCaprice Beaverue to concern for possible mantle cell lymphoma seen on pathology review of previous biopsy  Interval History:  Suleyma CaSiebers61 0.o. female with medical history significant for metastatic neuroendocrine tumor of the colon who presents for a follow up visit. The patient's last visit was on 01/21/2022. In the interim since the last visit she has continued on monthly lanreotide.   On exam today Ms. CaCurrieeports she has been well overall Immunities her last visit.  She reports she had quite a good summer and tolerated her last dose of Zometa without any difficulties.  She notes that she did have some symptoms after her first dose of Zometa but none with the dose administered in September.  She notes that her energy levels are quite good and that she is able to keep busy without feeling tired or fatigued.  She notes that she is not having any side effects as result of her lanreotide shot such as nausea, vomiting, or diarrhea.  She is sleeping well and eating well though occasionally has  a low appetite.  She is lost about 3 pounds in the interim since her last visit.  She reports that she  saw her primary care doctor on Tuesday and he noted she had hypotension was prescribing her new blood pressure medications.  She was also noted to have some blood in the urine has upcoming visit with urology.  Otherwise she has been at her baseline level of health.  She denies any fevers, chills, sweats, nausea, vomiting or diarrhea.  Full 10 point ROS is listed below.  A full 10 point ROS is listed below.  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   Cancer ([place])    Neuro endocrine tumor   CONJUNCTIVITIS, ALLERGIC 11/27/2008   HEMORRHOIDS 06/16/2008   HTN (hypertension)    Hx of adenomatous colonic polyps 11/2019   NIPPLE DISCHARGE 01/21/2010   Other specified forms of hearing loss 10/08/2009   Overweight(278.02) 05/29/2007   RASH-NONVESICULAR 05/29/2007   Wheezing 06/22/2010    SURGICAL HISTORY: Past Surgical History:  Procedure Laterality Date   COLONOSCOPY      SOCIAL HISTORY: Social History   Socioeconomic History   Marital status: Married    Spouse name: Not on file   Number of children: 3   Years of education: Not on file   Highest education level: Not on file  Occupational History   Occupation: [employer]    Employer: [employer]   Occupation: customer service  Tobacco Use   Smoking status: Some Days    Packs/day: 0.50    Years: 40.00    Total pack years: 20.00    Types: Cigarettes    Last attempt to quit: 10/22/2019    Years since quitting: 2.4   Smokeless tobacco: Never   Tobacco comments:    1 pack weekly.  started smoking at 61 years old  Vaping Use   Vaping Use: Never used  Substance and Sexual Activity   Alcohol use: Yes    Comment: ocassional   Drug use: No   Sexual activity: Not  Currently  Other Topics Concern   Not on file  Social History Narrative   Daily caffeine use 3   Patient does not get regular exercise   Social Determinants of Health   Financial Resource Strain: Not on file  Food Insecurity: Not on file  Transportation Needs: Not on file  Physical Activity: Not on file  Stress: Not on file  Social Connections: Not on file  Intimate Partner Violence: Not on file    FAMILY HISTORY: Family History  Problem Relation Age of Onset   Allergies Sister    Diabetes Sister    Hypertension Sister    Hypertension Mother    Prostate cancer Father    Hypertension Brother    Hypertension Sister    Diabetes Paternal Aunt    Cancer Paternal Aunt        type unknown   Prostate cancer Brother    Stroke Maternal Aunt    Hypertension Maternal Aunt    Colon cancer Neg Hx    Esophageal cancer Neg Hx    Rectal cancer Neg Hx    Stomach cancer Neg Hx     ALLERGIES:  is allergic to shellfish allergy.  MEDICATIONS:  Current Outpatient Medications  Medication Sig Dispense Refill   buPROPion (WELLBUTRIN XL) 300 MG 24 hr tablet TAKE 1 TABLET(300 MG) BY MOUTH DAILY 90 tablet 3   cholecalciferol (VITAMIN D3) 25 MCG (1000 UNIT) tablet Take 1,000 Units by mouth daily.     hydrochlorothiazide (HYDRODIURIL) 12.5 MG tablet TAKE 1 TABLET(12.5 MG) BY MOUTH DAILY 90 tablet 3   hydrocortisone 2.5 % cream Apply topically 2 (two) times daily. 30 g 1   iron polysaccharides (NU-IRON) 150 MG capsule Take 1 capsule (150 mg total) by mouth daily. 90 capsule 1   lanreotide acetate (SOMATULINE DEPOT) 120 MG/0.5ML injection Inject 120 mg into the skin every 28 (twenty-eight) days. 0.5 mL 6   lanreotide acetate (SOMATULINE DEPOT) 120 MG/0.5ML injection Inject 120 mg into the skin every 28 (twenty-eight) days. 0.5 mL 12   loratadine (CLARITIN) 10 MG tablet Take 10 mg by mouth daily as needed for allergies.     losartan (COZAAR) 50 MG tablet Take 1 tablet (50 mg total) by mouth daily.  90 tablet 3   ondansetron (ZOFRAN) 8 MG tablet Take 1 tablet (8 mg total) by mouth 2 (two) times daily as needed for nausea or vomiting. 20 tablet 0   ondansetron (ZOFRAN) 8 MG tablet Take 1 tablet (8 mg total) by mouth 2 (two) times daily as needed for nausea or vomiting. 20 tablet 0   ondansetron (ZOFRAN) 8 MG tablet Take 1 tablet (8 mg total) by mouth 2 (two) times daily as needed for nausea or vomiting. 20 tablet 0   oxyCODONE-acetaminophen (PERCOCET/ROXICET) 5-325 MG tablet Take 1 tablet by mouth every 6 (six) hours as needed for severe pain. 30 tablet 0   Turmeric 500 MG CAPS Take 500 mg by mouth daily.     vitamin B-12 (CYANOCOBALAMIN) 100 MCG tablet Take 100 mcg by mouth daily.     VITAMIN D-VITAMIN K PO Take 1 tablet by mouth daily.     zolpidem (AMBIEN) 10 MG tablet Take 1 tablet (10 mg total) by mouth at bedtime as needed for sleep. 90 tablet 1   No current facility-administered medications for this visit.   Facility-Administered Medications Ordered in Other Visits  Medication Dose Route Frequency Provider Last Rate Last Admin   octreotide (SANDOSTATIN LAR) 30 MG IM injection             REVIEW OF SYSTEMS:   Constitutional: ( - ) fevers, ( - )  chills , ( - ) night sweats Eyes: ( - ) blurriness of vision, ( - ) double vision, ( - ) watery eyes Ears, nose, mouth, throat, and face: ( - ) mucositis, ( - ) sore throat Respiratory: ( - ) cough, ( - ) dyspnea, ( - ) wheezes Cardiovascular: ( - ) palpitation, ( - ) chest discomfort, ( - ) lower extremity swelling Gastrointestinal:  ( - ) nausea, ( - ) heartburn, ( - ) change in bowel habits Skin: ( - ) abnormal skin rashes Lymphatics: ( - ) new lymphadenopathy, ( - ) easy bruising Neurological: ( - ) numbness, ( - ) tingling, ( - ) new weaknesses Behavioral/Psych: ( - ) mood change, ( - ) new changes  All other systems were reviewed with the patient and are negative.  PHYSICAL EXAMINATION: ECOG PERFORMANCE STATUS: 0 -  Asymptomatic  Vitals:   04/15/22 0848  BP: (!) 141/108  Pulse: 95  Resp: 16  Temp: 97.7 F (36.5 C)  SpO2: 97%     Filed Weights   04/15/22 0848  Weight: 179 lb 14.4 oz (81.6 kg)   GENERAL: well appearing middle aged [place] American female in NAD  SKIN: skin color, texture, turgor are normal, no rashes or significant lesions EYES: conjunctiva are pink and non-injected, sclera clear LUNGS: clear to auscultation and percussion with normal breathing effort HEART: regular rate & rhythm and no murmurs and no lower extremity edema Musculoskeletal: no cyanosis of digits and no clubbing  PSYCH: alert & oriented x 3, fluent speech NEURO: no focal motor/sensory deficits  LABORATORY DATA:  I have reviewed the data as listed    Latest Ref Rng & Units 04/15/2022    8:19 AM 03/18/2022    8:32 AM 02/18/2022    8:34 AM  CBC  WBC 4.0 - 10.5 K/uL 6.0  5.5  5.2   Hemoglobin 12.0 - 15.0 g/dL 12.0  12.8  11.5   Hematocrit 36.0 - 46.0 % 37.3  38.1  33.9   Platelets 150 - 400 K/uL 254  230  227        Latest Ref Rng & Units 04/15/2022    8:19 AM 03/18/2022    8:32 AM 02/18/2022    8:34 AM  CMP  Glucose 70 - 99 mg/dL 113  114  109   BUN 6 - 20 mg/dL 15  13  11    Creatinine 0.44 - 1.00 mg/dL 0.95  0.63  0.66   Sodium 135 - 145 mmol/L 141  140  140   Potassium 3.5 - 5.1 mmol/L 3.8  3.4  3.6   Chloride 98 - 111 mmol/L 104  105  108   CO2 22 - 32 mmol/L 31  28  29    Calcium 8.9 - 10.3 mg/dL 9.4  9.4  9.0   Total Protein 6.5 - 8.1 g/dL 7.4  7.2  6.7   Total Bilirubin 0.3 - 1.2 mg/dL 0.4  0.5  0.3   Alkaline Phos 38 - 126 U/L 61  59  59   AST 15 - 41 U/L 18  33  16   ALT 0 - 44 U/L 19  79  17     RADIOGRAPHIC STUDIES: I have personally reviewed the radiological images as listed and agreed with the findings in the report.  No results found.   ASSESSMENT & PLAN Aerika Fluitt 60 y.o. female with medical history significant for metastatic neuroendocrine tumor of the colon who presents  for a follow up visit.  After review of the labs, review the pathology, and review the PET CT imaging the findings are most consistent with metastatic neuroendocrine tumor with metastasis to the spine.  The pathology currently appears to be consistent with the neuroendocrine tumor which was previously resected from the patient's rectum.  # Metastatic Neuroendocrine Tumor, Metastasis to the Spine  --previously discussed with patient that the options would include observation with serial imaging or starting lanreotide therapy. The patient opted to start treatment --will plan for continued lanreotide 122m subq q28 days. Started therapy on 12/27/2019. This will be continued until progression or intolerance to therapy. Next dose due on 02/19/2021 --Due to the extensive involvement of her skeleton and increased activity on her last nuclear medicine scan it was recommended that she be considered for Lu 177 dotatate treatment. Initiated treatment on 04/15/2021 with plans to complete a total of 4 treatments.  --CU-64 scan q 3 months. Last in April 2022. [facility]  [facility], no evidence of lymphoma.  --Patient expressed interest in a second opinion.  She saw Dr. Leamon Arnt at [facility].  He provided excellent advice regarding options moving forward.  After hearing all the different options the patient noted she would like to continue her current lanreotide shots with consideration of increased frequency if she were to be found to be progressing --patient underwent treatment with Lutathera therapy. She is s/p treatment 4 of 4. They administered 30 mg IM long acting sandostatin on days where she undergoes Lutathera treatment (q 8 weeks x 4 doses). Completed the 4th Lutathera treatment on 09/30/21  Plan:  --continue lanreotide 120 mg q 28 days with q 3 month zometa --repeat scan to be performed November 2023 --Last Zometa infusion completed on 03/18/2022.  Next due in December  2023 -- Labs today show white blood cell count 6.0, hemoglobin 12.0, MCV 84.8, and platelets of 254.  Creatinine 0.95 --RTC in 12 weeks with interval continued monthly lanreotide injections.   #Mantle Cell Lymphoma -- Incidental finding from prior biopsy results.  This was detected on secondary pathological review at [facility] when the patient went for second opinion --Unclear if this represents a small low-grade clinically insignificant population of mantle cell lymphoma or a more concerning issue --Patient following with Dr. Caprice Beaver at [facility] --PET CT scan on 02/02/2021 showed no evidence of lymphoma.  --We will continue to monitor and appreciate the guidance of Dr. Caprice Beaver  No orders of the defined types were placed in this encounter.  All questions were answered. The patient knows to call the clinic with any problems, questions or concerns.  I have spent a total of 30 minutes minutes of face-to-face and non-face-to-face time, preparing to see the patient, performing a medically appropriate examination, counseling and educating the patient, ordering medications, documenting clinical information in the electronic health record, and care coordination.   [name], MD Department of Hematology/Oncology [facility] Phone: [phone] Pager: [phone] Email: [email or web address]  04/15/2022 9:15 AM

## 2022-04-18 LAB — CHROMOGRANIN A: Chromogranin A (ng/mL): 28.3 ng/mL (ref 0.0–101.8)

## 2022-04-28 ENCOUNTER — Telehealth: Payer: Self-pay | Admitting: *Deleted

## 2022-04-28 NOTE — Telephone Encounter (Signed)
"  Michelle Solomon [phone] (home) calling for status of FMLA.  Did you receive the e-mail with authorization forms?  Please call."  Message left confirming receipt via e-mail of [name] signed ROI and [name] cover sheet.  Poor quality with dark grey quality of both.  Dates of service start date reads 04-25-2022 yet initial [name] visit occurred on 10/25/2019.  Advised downloading attachments to electronically complete and sign or Docu-sign and return via e-mail if unable to deliver to [name].

## 2022-04-29 ENCOUNTER — Encounter: Payer: Self-pay | Admitting: *Deleted

## 2022-05-06 ENCOUNTER — Telehealth: Payer: Self-pay | Admitting: *Deleted

## 2022-05-06 NOTE — Telephone Encounter (Addendum)
FMLA paperwork completed by this nurse 05/04/2022 to provider bin for collaborative pick up for provider review, signature, and return. Received and successfully returned signed FMLA form.  Copy to H.I.M. bin for items to be scanned to EMR.  Original to [facility] file folder for patient pick up on next scheduled appointment completes process.

## 2022-05-06 NOTE — Telephone Encounter (Signed)
Received vm message from pt inquiring about her PET scan for this month. Looked at pt's chart. The scan has not been authorized yet. It was ordered on 04/15/22. Message sent to Lendell Caprice regarding prior auth. Pt made aware of the current status.[name] Kitchen

## 2022-05-13 ENCOUNTER — Inpatient Hospital Stay: Payer: 59 | Attending: Hematology and Oncology

## 2022-05-13 ENCOUNTER — Other Ambulatory Visit: Payer: Self-pay | Admitting: Hematology and Oncology

## 2022-05-13 ENCOUNTER — Inpatient Hospital Stay: Payer: 59

## 2022-05-13 ENCOUNTER — Telehealth: Payer: Self-pay | Admitting: *Deleted

## 2022-05-13 ENCOUNTER — Other Ambulatory Visit: Payer: Self-pay

## 2022-05-13 VITALS — BP 157/98 | HR 85 | Temp 98.4°F | Resp 17

## 2022-05-13 DIAGNOSIS — C7B8 Other secondary neuroendocrine tumors: Secondary | ICD-10-CM

## 2022-05-13 DIAGNOSIS — C7A025 Malignant carcinoid tumor of the sigmoid colon: Secondary | ICD-10-CM | POA: Insufficient documentation

## 2022-05-13 DIAGNOSIS — C7B03 Secondary carcinoid tumors of bone: Secondary | ICD-10-CM | POA: Diagnosis present

## 2022-05-13 DIAGNOSIS — Z79899 Other long term (current) drug therapy: Secondary | ICD-10-CM | POA: Diagnosis not present

## 2022-05-13 LAB — CBC WITH DIFFERENTIAL (CANCER CENTER ONLY)
Abs Immature Granulocytes: 0.02 10*3/uL (ref 0.00–0.07)
Basophils Absolute: 0 10*3/uL (ref 0.0–0.1)
Basophils Relative: 0 %
Eosinophils Absolute: 0.1 10*3/uL (ref 0.0–0.5)
Eosinophils Relative: 2 %
HCT: 35.4 % — ABNORMAL LOW (ref 36.0–46.0)
Hemoglobin: 11.6 g/dL — ABNORMAL LOW (ref 12.0–15.0)
Immature Granulocytes: 0 %
Lymphocytes Relative: 36 %
Lymphs Abs: 2.3 10*3/uL (ref 0.7–4.0)
MCH: 27.8 pg (ref 26.0–34.0)
MCHC: 32.8 g/dL (ref 30.0–36.0)
MCV: 84.7 fL (ref 80.0–100.0)
Monocytes Absolute: 0.6 10*3/uL (ref 0.1–1.0)
Monocytes Relative: 10 %
Neutro Abs: 3.3 10*3/uL (ref 1.7–7.7)
Neutrophils Relative %: 52 %
Platelet Count: 244 10*3/uL (ref 150–400)
RBC: 4.18 MIL/uL (ref 3.87–5.11)
RDW: 14 % (ref 11.5–15.5)
WBC Count: 6.4 10*3/uL (ref 4.0–10.5)
nRBC: 0 % (ref 0.0–0.2)

## 2022-05-13 LAB — CMP (CANCER CENTER ONLY)
ALT: 13 U/L (ref 0–44)
AST: 14 U/L — ABNORMAL LOW (ref 15–41)
Albumin: 4.1 g/dL (ref 3.5–5.0)
Alkaline Phosphatase: 60 U/L (ref 38–126)
Anion gap: 8 (ref 5–15)
BUN: 12 mg/dL (ref 6–20)
CO2: 27 mmol/L (ref 22–32)
Calcium: 9.2 mg/dL (ref 8.9–10.3)
Chloride: 102 mmol/L (ref 98–111)
Creatinine: 0.84 mg/dL (ref 0.44–1.00)
GFR, Estimated: >60 mL/min (ref >60)
Glucose, Bld: 211 mg/dL — ABNORMAL HIGH (ref 70–99)
Potassium: 3.8 mmol/L (ref 3.5–5.1)
Sodium: 137 mmol/L (ref 135–145)
Total Bilirubin: 0.5 mg/dL (ref 0.3–1.2)
Total Protein: 7.5 g/dL (ref 6.5–8.1)

## 2022-05-13 MED ORDER — LANREOTIDE ACETATE 120 MG/0.5ML ~~LOC~~ SOLN
120.0000 mg | Freq: Once | SUBCUTANEOUS | Status: AC
  Administered 2022-05-13: 120 mg via SUBCUTANEOUS
  Filled 2022-05-13: qty 120

## 2022-05-13 NOTE — Telephone Encounter (Signed)
TCT patient regarding PET. Spoke with her. Advised that her next PET scan is on 05/30/22 @ 10 am with arrival time of 9:30 am. Pt voiced understanding. She is fine with this date and time. Her next appt with Dr. Lorenso Courier is 06/10/22

## 2022-05-16 LAB — CHROMOGRANIN A: Chromogranin A (ng/mL): 23.4 ng/mL (ref 0.0–101.8)

## 2022-05-30 ENCOUNTER — Encounter (HOSPITAL_COMMUNITY)
Admission: RE | Admit: 2022-05-30 | Discharge: 2022-05-30 | Disposition: A | Discharge status: Home / Self Care | Payer: 59 | Source: Ambulatory Visit | Attending: Hematology and Oncology | Admitting: Hematology and Oncology

## 2022-05-30 ENCOUNTER — Other Ambulatory Visit: Payer: Self-pay | Admitting: Hematology and Oncology

## 2022-05-30 DIAGNOSIS — C7B8 Other secondary neuroendocrine tumors: Secondary | ICD-10-CM | POA: Insufficient documentation

## 2022-05-30 DIAGNOSIS — C7A8 Other malignant neuroendocrine tumors: Secondary | ICD-10-CM

## 2022-05-30 MED ORDER — COPPER CU 64 DOTATATE 1 MCI/ML IV SOLN
4.0000 | Freq: Once | INTRAVENOUS | Status: AC
  Administered 2022-05-30: 3.98 via INTRAVENOUS

## 2022-06-10 ENCOUNTER — Other Ambulatory Visit: Payer: Self-pay

## 2022-06-10 ENCOUNTER — Inpatient Hospital Stay: Payer: 59 | Attending: Hematology and Oncology

## 2022-06-10 ENCOUNTER — Inpatient Hospital Stay: Payer: 59

## 2022-06-10 VITALS — BP 150/98 | HR 78 | Temp 97.6°F | Resp 18

## 2022-06-10 DIAGNOSIS — C7B03 Secondary carcinoid tumors of bone: Secondary | ICD-10-CM | POA: Insufficient documentation

## 2022-06-10 DIAGNOSIS — C7A025 Malignant carcinoid tumor of the sigmoid colon: Secondary | ICD-10-CM | POA: Insufficient documentation

## 2022-06-10 DIAGNOSIS — C7B8 Other secondary neuroendocrine tumors: Secondary | ICD-10-CM

## 2022-06-10 LAB — CBC WITH DIFFERENTIAL (CANCER CENTER ONLY)
Abs Immature Granulocytes: 0.02 10*3/uL (ref 0.00–0.07)
Basophils Absolute: 0 10*3/uL (ref 0.0–0.1)
Basophils Relative: 0 %
Eosinophils Absolute: 0.1 10*3/uL (ref 0.0–0.5)
Eosinophils Relative: 2 %
HCT: 35 % — ABNORMAL LOW (ref 36.0–46.0)
Hemoglobin: 11.7 g/dL — ABNORMAL LOW (ref 12.0–15.0)
Immature Granulocytes: 0 %
Lymphocytes Relative: 35 %
Lymphs Abs: 2.1 10*3/uL (ref 0.7–4.0)
MCH: 28.1 pg (ref 26.0–34.0)
MCHC: 33.4 g/dL (ref 30.0–36.0)
MCV: 84.1 fL (ref 80.0–100.0)
Monocytes Absolute: 0.7 10*3/uL (ref 0.1–1.0)
Monocytes Relative: 11 %
Neutro Abs: 3.2 10*3/uL (ref 1.7–7.7)
Neutrophils Relative %: 52 %
Platelet Count: 234 10*3/uL (ref 150–400)
RBC: 4.16 MIL/uL (ref 3.87–5.11)
RDW: 13.9 % (ref 11.5–15.5)
WBC Count: 6.2 10*3/uL (ref 4.0–10.5)
nRBC: 0 % (ref 0.0–0.2)

## 2022-06-10 LAB — CMP (CANCER CENTER ONLY)
ALT: 18 U/L (ref 0–44)
AST: 17 U/L (ref 15–41)
Albumin: 4.1 g/dL (ref 3.5–5.0)
Alkaline Phosphatase: 58 U/L (ref 38–126)
Anion gap: 6 (ref 5–15)
BUN: 11 mg/dL (ref 6–20)
CO2: 28 mmol/L (ref 22–32)
Calcium: 9.7 mg/dL (ref 8.9–10.3)
Chloride: 104 mmol/L (ref 98–111)
Creatinine: 0.74 mg/dL (ref 0.44–1.00)
GFR, Estimated: >60 mL/min (ref >60)
Glucose, Bld: 109 mg/dL — ABNORMAL HIGH (ref 70–99)
Potassium: 3.9 mmol/L (ref 3.5–5.1)
Sodium: 138 mmol/L (ref 135–145)
Total Bilirubin: 0.6 mg/dL (ref 0.3–1.2)
Total Protein: 7.1 g/dL (ref 6.5–8.1)

## 2022-06-10 MED ORDER — SODIUM CHLORIDE 0.9 % IV SOLN: Freq: Once | INTRAVENOUS | Status: AC

## 2022-06-10 MED ORDER — LANREOTIDE ACETATE 120 MG/0.5ML ~~LOC~~ SOLN
120.0000 mg | Freq: Once | SUBCUTANEOUS | Status: AC
  Administered 2022-06-10: 120 mg via SUBCUTANEOUS
  Filled 2022-06-10: qty 120

## 2022-06-10 MED ORDER — ZOLEDRONIC ACID 4 MG/100ML IV SOLN
4.0000 mg | Freq: Once | INTRAVENOUS | Status: AC
  Administered 2022-06-10: 4 mg via INTRAVENOUS
  Filled 2022-06-10: qty 100

## 2022-06-10 NOTE — Patient Instructions (Signed)
[facility]  Discharge Instructions: Thank you for choosing [facility] to provide your oncology and hematology care.   If you have a lab appointment with the [facility], please go directly to the [facility] and check in at the registration area.   Wear comfortable clothing and clothing appropriate for easy access to any Portacath or PICC line.   We strive to give you quality time with your provider. You may need to reschedule your appointment if you arrive late (15 or more minutes).  Arriving late affects you and other patients whose appointments are after yours.  Also, if you miss three or more appointments without notifying the office, you may be dismissed from the clinic at the provider's discretion.      For prescription refill requests, have your pharmacy contact our office and allow 72 hours for refills to be completed.    Today you received the following chemotherapy and/or immunotherapy agent: Zometa  , Lanreotide   To help prevent nausea and vomiting after your treatment, we encourage you to take your nausea medication as directed.  BELOW ARE SYMPTOMS THAT SHOULD BE REPORTED IMMEDIATELY: *FEVER GREATER THAN 100.4 F (38 C) OR HIGHER *CHILLS OR SWEATING *NAUSEA AND VOMITING THAT IS NOT CONTROLLED WITH YOUR NAUSEA MEDICATION *UNUSUAL SHORTNESS OF BREATH *UNUSUAL BRUISING OR BLEEDING *URINARY PROBLEMS (pain or burning when urinating, or frequent urination) *BOWEL PROBLEMS (unusual diarrhea, constipation, pain near the anus) TENDERNESS IN MOUTH AND THROAT WITH OR WITHOUT PRESENCE OF ULCERS (sore throat, sores in mouth, or a toothache) UNUSUAL RASH, SWELLING OR PAIN  UNUSUAL VAGINAL DISCHARGE OR ITCHING   Items with * indicate a potential emergency and should be followed up as soon as possible or go to the Emergency Department if any problems should occur.  Please show the CHEMOTHERAPY ALERT CARD or IMMUNOTHERAPY ALERT CARD at  check-in to the Emergency Department and triage nurse.  Should you have questions after your visit or need to cancel or reschedule your appointment, please contact [facility]  Dept: [phone]  and follow the prompts.  Office hours are 8:00 a.m. to 4:30 p.m. Monday - Friday. Please note that voicemails left after 4:00 p.m. may not be returned until the following business day.  We are closed weekends and major holidays. You have access to a nurse at all times for urgent questions. Please call the main number to the clinic Dept: [phone] and follow the prompts.   For any non-urgent questions, you may also contact your provider using MyChart. We now offer e-Visits for anyone 23 and older to request care online for non-urgent symptoms. For details visit [email or web address].   Also download the MyChart app! Go to the app store, search "MyChart", open the app, select [facility], and log in with your MyChart username and password.  Masks are optional in the cancer centers. If you would like for your care team to wear a mask while they are taking care of you, please let them know. For doctor visits, patients may have with them one support person who is at least 60 years old. At this time, visitors are not allowed in the infusion area.

## 2022-06-14 ENCOUNTER — Telehealth: Payer: Self-pay | Admitting: *Deleted

## 2022-06-14 ENCOUNTER — Telehealth: Payer: Self-pay | Admitting: Internal Medicine

## 2022-06-14 DIAGNOSIS — D509 Iron deficiency anemia, unspecified: Secondary | ICD-10-CM

## 2022-06-14 LAB — CHROMOGRANIN A: Chromogranin A (ng/mL): 20.1 ng/mL (ref 0.0–101.8)

## 2022-06-14 NOTE — Telephone Encounter (Signed)
Patient called requesting results of PET scan.  Routing to Dr Lorenso Courier to please review and advise.

## 2022-06-14 NOTE — Telephone Encounter (Signed)
As it is not clear that the iron needs to be continued, and the hemoglobin was only slightly low at last check about 2 wks ago, we should check iron panel first - I will place orders

## 2022-06-14 NOTE — Telephone Encounter (Signed)
This medication was last prescribed in 2021, is it ok to renew? LOV 04/13/22

## 2022-06-14 NOTE — Telephone Encounter (Signed)
Caller & Relationship to patient: Self  Call back number: [phone]   Date of last office visit: 10.11.23  Date of next office visit: N/A  Medication(s) to be refilled: iron polysaccharides (NU-IRON) 150 MG capsule    Preferred Pharmacy:   [id number]   Phone: [phone]  Fax: [phone]

## 2022-06-15 ENCOUNTER — Telehealth: Payer: Self-pay | Admitting: *Deleted

## 2022-06-15 NOTE — Telephone Encounter (Signed)
Informed patient for labs, she states she will have them done in the morning

## 2022-06-15 NOTE — Telephone Encounter (Signed)
Same answer - ok for iron blood testing, and if low, she may need further tx    thanks

## 2022-06-15 NOTE — Telephone Encounter (Signed)
Spoke with patient who states she had labs done when she saw oncology last week and her HGB was 11.7 and he did not recommend iron meds but she is requesting it due to being tired and would like to start them before her trip next week.

## 2022-06-15 NOTE — Telephone Encounter (Signed)
-----   Message from Orson Slick, MD sent at 06/15/2022  4:35 PM EST ----- Please let Mrs. Wileman know that her labs are stable and her PET Dotatate scan shows continued strong response to therapy. No new areas of disease or progression noted.  We will see her as scheduled in Jan 2024  ----- Message ----- From: Buel Ream, Lab In [place] Sent: 06/10/2022   9:18 AM EST To: Orson Slick, MD

## 2022-06-15 NOTE — Telephone Encounter (Signed)
TCT patient regarding recent lab results and scan results. Advised that that her labs are stable and her PET Dotatate scan shows continued strong response to therapy. No new areas of disease or progression noted.  Advised we will see her back in January of 2024. Pt very happy about these results.

## 2022-06-15 NOTE — Telephone Encounter (Signed)
Patient called back, request a call back when available to discuss.

## 2022-06-15 NOTE — Telephone Encounter (Signed)
Left message for a call back regarding iron

## 2022-06-16 ENCOUNTER — Other Ambulatory Visit (INDEPENDENT_AMBULATORY_CARE_PROVIDER_SITE_OTHER): Payer: 59

## 2022-06-16 DIAGNOSIS — D509 Iron deficiency anemia, unspecified: Secondary | ICD-10-CM

## 2022-06-16 LAB — IBC PANEL
Iron: 103 ug/dL (ref 42–145)
Saturation Ratios: 33.4 % (ref 20.0–50.0)
TIBC: 308 ug/dL (ref 250.0–450.0)
Transferrin: 220 mg/dL (ref 212.0–360.0)

## 2022-06-16 LAB — FERRITIN: Ferritin: 144.1 ng/mL (ref 10.0–291.0)

## 2022-07-08 ENCOUNTER — Inpatient Hospital Stay: Payer: 59

## 2022-07-08 ENCOUNTER — Other Ambulatory Visit: Payer: Self-pay

## 2022-07-08 ENCOUNTER — Inpatient Hospital Stay: Payer: 59 | Attending: Hematology and Oncology

## 2022-07-08 ENCOUNTER — Inpatient Hospital Stay (HOSPITAL_BASED_OUTPATIENT_CLINIC_OR_DEPARTMENT_OTHER): Payer: 59 | Admitting: Hematology and Oncology

## 2022-07-08 VITALS — BP 136/97 | HR 80 | Temp 97.8°F | Resp 16 | Wt 178.1 lb

## 2022-07-08 DIAGNOSIS — C7B03 Secondary carcinoid tumors of bone: Secondary | ICD-10-CM | POA: Diagnosis present

## 2022-07-08 DIAGNOSIS — C7B8 Other secondary neuroendocrine tumors: Secondary | ICD-10-CM

## 2022-07-08 DIAGNOSIS — C7A025 Malignant carcinoid tumor of the sigmoid colon: Secondary | ICD-10-CM | POA: Diagnosis not present

## 2022-07-08 DIAGNOSIS — Z79899 Other long term (current) drug therapy: Secondary | ICD-10-CM | POA: Diagnosis not present

## 2022-07-08 DIAGNOSIS — M899 Disorder of bone, unspecified: Secondary | ICD-10-CM

## 2022-07-08 DIAGNOSIS — C7A8 Other malignant neuroendocrine tumors: Secondary | ICD-10-CM | POA: Diagnosis not present

## 2022-07-08 LAB — CMP (CANCER CENTER ONLY)
ALT: 19 U/L (ref 0–44)
AST: 17 U/L (ref 15–41)
Albumin: 4.2 g/dL (ref 3.5–5.0)
Alkaline Phosphatase: 64 U/L (ref 38–126)
Anion gap: 6 (ref 5–15)
BUN: 10 mg/dL (ref 6–20)
CO2: 29 mmol/L (ref 22–32)
Calcium: 9.1 mg/dL (ref 8.9–10.3)
Chloride: 106 mmol/L (ref 98–111)
Creatinine: 0.72 mg/dL (ref 0.44–1.00)
GFR, Estimated: >60 mL/min (ref >60)
Glucose, Bld: 105 mg/dL — ABNORMAL HIGH (ref 70–99)
Potassium: 3.6 mmol/L (ref 3.5–5.1)
Sodium: 141 mmol/L (ref 135–145)
Total Bilirubin: 0.5 mg/dL (ref 0.3–1.2)
Total Protein: 6.9 g/dL (ref 6.5–8.1)

## 2022-07-08 LAB — CBC WITH DIFFERENTIAL (CANCER CENTER ONLY)
Abs Immature Granulocytes: 0.01 10*3/uL (ref 0.00–0.07)
Basophils Absolute: 0 10*3/uL (ref 0.0–0.1)
Basophils Relative: 0 %
Eosinophils Absolute: 0.1 10*3/uL (ref 0.0–0.5)
Eosinophils Relative: 3 %
HCT: 36.3 % (ref 36.0–46.0)
Hemoglobin: 12.1 g/dL (ref 12.0–15.0)
Immature Granulocytes: 0 %
Lymphocytes Relative: 36 %
Lymphs Abs: 2 10*3/uL (ref 0.7–4.0)
MCH: 28.3 pg (ref 26.0–34.0)
MCHC: 33.3 g/dL (ref 30.0–36.0)
MCV: 84.8 fL (ref 80.0–100.0)
Monocytes Absolute: 0.6 10*3/uL (ref 0.1–1.0)
Monocytes Relative: 10 %
Neutro Abs: 2.9 10*3/uL (ref 1.7–7.7)
Neutrophils Relative %: 51 %
Platelet Count: 237 10*3/uL (ref 150–400)
RBC: 4.28 MIL/uL (ref 3.87–5.11)
RDW: 13.5 % (ref 11.5–15.5)
WBC Count: 5.6 10*3/uL (ref 4.0–10.5)
nRBC: 0 % (ref 0.0–0.2)

## 2022-07-08 MED ORDER — LANREOTIDE ACETATE 120 MG/0.5ML ~~LOC~~ SOLN
120.0000 mg | Freq: Once | SUBCUTANEOUS | Status: AC
  Administered 2022-07-08: 120 mg via SUBCUTANEOUS
  Filled 2022-07-08: qty 120

## 2022-07-12 LAB — CHROMOGRANIN A: Chromogranin A (ng/mL): 21.8 ng/mL (ref 0.0–101.8)

## 2022-07-18 ENCOUNTER — Encounter: Payer: Self-pay | Admitting: Hematology and Oncology

## 2022-07-18 NOTE — Progress Notes (Signed)
[facility] [phone]   [phone]  PROGRESS NOTE  Patient Care Team: Biagio Borg, MD as PCP - General  Hematological/Oncological History # Metastatic Neuroendocrine Tumor, Metastasis to the Spine 1) 08/26/2019: CT Abdomen Pelvis performed due to RUQ abdominal pain. Imaging showed a soft tissue mass abutting the right posterior eighth rib 2) 09/06/2019: CT chest performed which showed Ill-defined soft tissue nodule along the medial right posterior thoracic wall at the 8th intercostal space to the right of the spine. MRI was recommended. 3) 10/19/2019: MRI Thoracic spine showed enhancing lesions throughout the visualized spine consistent with metastatic disease. Additionally there was a 1.5 cm soft tissue nodule in the posteromedial right eighth intercostal space, more concerning for a metastasis 4) 10/25/2019: establish care with Dr. Lorenso Courier   5) 11/05/2019: attempted biopsy of soft tissue mass at right posterior 8th rib, but lesion had dissipated at time of biopsy attempt 6) 11/27/2019: PET CT scan performed showing right inguinal lymph nodes and osseous lesions, indicative of metastatic disease of unknown primary. 7) 12/11/2019: CT bone marrow biopsy showed metastatic neuroendocrine neoplasm 8) 12/27/2019: start of lanreotide '120mg'$  sub q28 days  9) 10/15/2020: NM PET (CU-64) scan showed widespread osseous and lymphatic uptake consistent with metastatic spread of neuroendocrine tumor.  This is more avid than prior. 01/20/2021: clinic visit with Dr. Caprice Beaver due to concern for possible mantle cell lymphoma seen on pathology review of previous biopsy 04/15/2021: Dose 1 (of 4) of lutathera treatment with nuclear medicine.  06/10/2021: Dose 2 (of 4) of lutathera treatment with nuclear medicine.  08/05/2021: Dose 3 (of 4) of lutathera treatment with nuclear medicine.  09/03/2021:Dose 4 (of 4) of lutathera treatment with nuclear medicine.  11/26/2021: NM Copper Dotatate Scan  shows dramatic reduction in number and activity of skeletal metastasis.   #Low Grade Neuroendocrine Tumor 1) 2006: reportedly had rectal carcinoid tumor removed 2) 08/14/2008: patient had a flexibile sigmoidoscopy with endoscopic ultrasound which resected an 7m, subepithelial lesion in rectum. Findings consistent with low grade neuroendocrine tumor. 3) 04/08/2010: repeat colonoscopy, no residual tumor. Repeat recommended in 2016.  4) 12/11/2019: metastatic recurrence found on biopsy, noted above. 5) 11/26/2021:  NM PET CU-64 showed a dramatic reduction in the number and activity as skeletal metastases.  #Mantle Cell Lymphoma 11/24/2020: incidental findings of low level mantle cell lymphoma on bone marrow biopsy. [facility]  01/20/2021: clinic visit with Dr. MCaprice Beaverdue to concern for possible mantle cell lymphoma seen on pathology review of previous biopsy  Interval History:  Michelle CPrzybylski62y.o. female with medical history significant for metastatic neuroendocrine tumor of the colon who presents for a follow up visit. The patient's last visit was on 01/21/2022. In the interim since the last visit she has continued on monthly lanreotide.   On exam today Ms. CBluesteinreports her new year is "off to a good start".  She notes she is been feeling good and has no new issues.  She notes that she reports her vision has been getting more blurry though she recently got a new glasses prescription.  She notes that her ear on the left side sometimes feels like it "cramps" and freezes".  She notes that she does wear a headset 8 hours a day per work which may be contributing.  She is not having any issues with nausea, vomiting, or diarrhea.  She notes that rarely she gets an occasional catch in her chest but otherwise she is  felt quite well.  She notes that she is being evaluated for some blood noted previously in her urine.  She reports has been no blood in her  urine recently.  She denies any fevers, chills, sweats, nausea, vomiting or diarrhea.  Full 10 point ROS is listed below.  A full 10 point ROS is listed below.  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   Cancer ([place])    Neuro endocrine tumor   CONJUNCTIVITIS, ALLERGIC 11/27/2008   HEMORRHOIDS 06/16/2008   HTN (hypertension)    Hx of adenomatous colonic polyps 11/2019   NIPPLE DISCHARGE 01/21/2010   Other specified forms of hearing loss 10/08/2009   Overweight(278.02) 05/29/2007   RASH-NONVESICULAR 05/29/2007   Wheezing 06/22/2010    SURGICAL HISTORY: Past Surgical History:  Procedure Laterality Date   COLONOSCOPY      SOCIAL HISTORY: Social History   Socioeconomic History   Marital status: Married    Spouse name: Not on file   Number of children: 3   Years of education: Not on file   Highest education level: Not on file  Occupational History   Occupation: [employer]    Employer: [employer]   Occupation: customer service  Tobacco Use   Smoking status: Some Days    Packs/day: 0.50    Years: 40.00    Total pack years: 20.00    Types: Cigarettes    Last attempt to quit: 10/22/2019    Years since quitting: 2.7   Smokeless tobacco: Never   Tobacco comments:    1 pack weekly.  started smoking at 62 years old  Vaping Use   Vaping Use: Never used  Substance and Sexual Activity   Alcohol use: Yes    Comment: ocassional   Drug use: No   Sexual activity: Not Currently  Other Topics Concern   Not on file  Social History Narrative   Daily caffeine use 3   Patient does not get regular exercise   Social Determinants of Health   Financial Resource Strain: Not on file  Food Insecurity: Not on file   Transportation Needs: Not on file  Physical Activity: Not on file  Stress: Not on file  Social Connections: Not on file  Intimate Partner Violence: Not on file    FAMILY HISTORY: Family History  Problem Relation Age of Onset   Allergies Sister    Diabetes Sister    Hypertension Sister    Hypertension Mother    Prostate cancer Father    Hypertension Brother    Hypertension Sister    Diabetes Paternal Aunt    Cancer Paternal Aunt        type unknown   Prostate cancer Brother    Stroke Maternal Aunt    Hypertension Maternal Aunt    Colon cancer Neg Hx    Esophageal cancer Neg Hx    Rectal cancer Neg Hx    Stomach cancer Neg Hx     ALLERGIES:  is allergic to shellfish allergy.  MEDICATIONS:  Current Outpatient Medications  Medication Sig Dispense Refill   buPROPion (WELLBUTRIN XL) 300 MG 24 hr tablet TAKE 1 TABLET(300 MG) BY MOUTH DAILY 90 tablet 3   cholecalciferol (VITAMIN  D3) 25 MCG (1000 UNIT) tablet Take 1,000 Units by mouth daily.     hydrochlorothiazide (HYDRODIURIL) 12.5 MG tablet TAKE 1 TABLET(12.5 MG) BY MOUTH DAILY 90 tablet 3   hydrocortisone 2.5 % cream Apply topically 2 (two) times daily. 30 g 1   iron polysaccharides (NU-IRON) 150 MG capsule Take 1 capsule (150 mg total) by mouth daily. 90 capsule 1   lanreotide acetate (SOMATULINE DEPOT) 120 MG/0.5ML injection Inject 120 mg into the skin every 28 (twenty-eight) days. 0.5 mL 6   lanreotide acetate (SOMATULINE DEPOT) 120 MG/0.5ML injection Inject 120 mg into the skin every 28 (twenty-eight) days. 0.5 mL 12   loratadine (CLARITIN) 10 MG tablet Take 10 mg by mouth daily as needed for allergies.     losartan (COZAAR) 50 MG tablet Take 1 tablet (50 mg total) by mouth daily. 90 tablet 3   ondansetron (ZOFRAN) 8 MG tablet Take 1 tablet (8 mg total) by mouth 2 (two) times daily as needed for nausea or vomiting. 20 tablet 0   ondansetron (ZOFRAN) 8 MG tablet Take 1 tablet (8 mg total) by mouth 2 (two) times daily as  needed for nausea or vomiting. 20 tablet 0   ondansetron (ZOFRAN) 8 MG tablet Take 1 tablet (8 mg total) by mouth 2 (two) times daily as needed for nausea or vomiting. 20 tablet 0   oxyCODONE-acetaminophen (PERCOCET/ROXICET) 5-325 MG tablet Take 1 tablet by mouth every 6 (six) hours as needed for severe pain. 30 tablet 0   Turmeric 500 MG CAPS Take 500 mg by mouth daily.     vitamin B-12 (CYANOCOBALAMIN) 100 MCG tablet Take 100 mcg by mouth daily.     VITAMIN D-VITAMIN K PO Take 1 tablet by mouth daily.     zolpidem (AMBIEN) 10 MG tablet Take 1 tablet (10 mg total) by mouth at bedtime as needed for sleep. 90 tablet 1   No current facility-administered medications for this visit.   Facility-Administered Medications Ordered in Other Visits  Medication Dose Route Frequency Provider Last Rate Last Admin   octreotide (SANDOSTATIN LAR) 30 MG IM injection             REVIEW OF SYSTEMS:   Constitutional: ( - ) fevers, ( - )  chills , ( - ) night sweats Eyes: ( - ) blurriness of vision, ( - ) double vision, ( - ) watery eyes Ears, nose, mouth, throat, and face: ( - ) mucositis, ( - ) sore throat Respiratory: ( - ) cough, ( - ) dyspnea, ( - ) wheezes Cardiovascular: ( - ) palpitation, ( - ) chest discomfort, ( - ) lower extremity swelling Gastrointestinal:  ( - ) nausea, ( - ) heartburn, ( - ) change in bowel habits Skin: ( - ) abnormal skin rashes Lymphatics: ( - ) new lymphadenopathy, ( - ) easy bruising Neurological: ( - ) numbness, ( - ) tingling, ( - ) new weaknesses Behavioral/Psych: ( - ) mood change, ( - ) new changes  All other systems were reviewed with the patient and are negative.  PHYSICAL EXAMINATION: ECOG PERFORMANCE STATUS: 0 - Asymptomatic  Vitals:   07/08/22 1054  BP: (!) 136/97  Pulse: 80  Resp: 16  Temp: 97.8 F (36.6 C)  SpO2: 100%     Filed Weights   07/08/22 1054  Weight: 178 lb 1.6 oz (80.8 kg)   GENERAL: well appearing middle aged [place] American female  in NAD  SKIN: skin color, texture, turgor are normal,  no rashes or significant lesions EYES: conjunctiva are pink and non-injected, sclera clear LUNGS: clear to auscultation and percussion with normal breathing effort HEART: regular rate & rhythm and no murmurs and no lower extremity edema Musculoskeletal: no cyanosis of digits and no clubbing  PSYCH: alert & oriented x 3, fluent speech NEURO: no focal motor/sensory deficits  LABORATORY DATA:  I have reviewed the data as listed    Latest Ref Rng & Units 07/08/2022   10:07 AM 06/10/2022    9:08 AM 05/13/2022    8:14 AM  CBC  WBC 4.0 - 10.5 K/uL 5.6  6.2  6.4   Hemoglobin 12.0 - 15.0 g/dL 12.1  11.7  11.6   Hematocrit 36.0 - 46.0 % 36.3  35.0  35.4   Platelets 150 - 400 K/uL 237  234  244        Latest Ref Rng & Units 07/08/2022   10:07 AM 06/10/2022    9:08 AM 05/13/2022    8:14 AM  CMP  Glucose 70 - 99 mg/dL 105  109  211   BUN 6 - 20 mg/dL '10  11  12   '$ Creatinine 0.44 - 1.00 mg/dL 0.72  0.74  0.84   Sodium 135 - 145 mmol/L 141  138  137   Potassium 3.5 - 5.1 mmol/L 3.6  3.9  3.8   Chloride 98 - 111 mmol/L 106  104  102   CO2 22 - 32 mmol/L '29  28  27   '$ Calcium 8.9 - 10.3 mg/dL 9.1  9.7  9.2   Total Protein 6.5 - 8.1 g/dL 6.9  7.1  7.5   Total Bilirubin 0.3 - 1.2 mg/dL 0.5  0.6  0.5   Alkaline Phos 38 - 126 U/L 64  58  60   AST 15 - 41 U/L '17  17  14   '$ ALT 0 - 44 U/L '19  18  13     '$ RADIOGRAPHIC STUDIES: I have personally reviewed the radiological images as listed and agreed with the findings in the report.  No results found.   ASSESSMENT & PLAN Pari Start 62 y.o. female with medical history significant for metastatic neuroendocrine tumor of the colon who presents for a follow up visit.  After review of the labs, review the pathology, and review the PET CT imaging the findings are most consistent with metastatic neuroendocrine tumor with metastasis to the spine.  The pathology currently appears to be consistent with  the neuroendocrine tumor which was previously resected from the patient's rectum.  # Metastatic Neuroendocrine Tumor, Metastasis to the Spine  --previously discussed with patient that the options would include observation with serial imaging or starting lanreotide therapy. The patient opted to start treatment --will plan for continued lanreotide '120mg'$  subq q28 days. Started therapy on 12/27/2019. This will be continued until progression or intolerance to therapy. Next dose due on 02/19/2021 --Due to the extensive involvement of her skeleton and increased activity on her last nuclear medicine scan it was recommended that she be considered for Lu 177 dotatate treatment. Initiated treatment on 04/15/2021 with plans to complete a total of 4 treatments.  --CU-64 scan q 3 months. Last in April 2022. Completed PET CT [facility] on 02/02/2021, no evidence of lymphoma.  --Patient expressed interest in a second opinion.  She saw Dr. Leamon Arnt at [facility].  He provided excellent advice regarding options moving forward.  After hearing all the different options the patient noted she  would like to continue her current lanreotide shots with consideration of increased frequency if she were to be found to be progressing --patient underwent treatment with Lutathera therapy. She is s/p treatment 4 of 4. They administered 30 mg IM long acting sandostatin on days where she undergoes Lutathera treatment (q 8 weeks x 4 doses). Completed the 4th Lutathera treatment on 09/30/21  Plan:  --continue lanreotide 120 mg q 28 days with q 3 month zometa --repeat scan to be performed November 2023 --Last Zometa infusion completed on 06/10/2022.  Next due in March 2024 -- Labs today show white blood cell count 5.6, hemoglobin 12.1, MCV 84.8, and platelets of 237. --RTC in 12 weeks with interval continued monthly lanreotide injections.   #Mantle Cell Lymphoma -- Incidental finding from prior biopsy results.  This  was detected on secondary pathological review at [facility] when the patient went for second opinion --Unclear if this represents a small low-grade clinically insignificant population of mantle cell lymphoma or a more concerning issue --Patient following with Dr. Caprice Beaver at [facility] --PET CT scan on 02/02/2021 showed no evidence of lymphoma.  --We will continue to monitor and appreciate the guidance of Dr. Caprice Beaver  No orders of the defined types were placed in this encounter.  All questions were answered. The patient knows to call the clinic with any problems, questions or concerns.  I have spent a total of 30 minutes minutes of face-to-face and non-face-to-face time, preparing to see the patient, performing a medically appropriate examination, counseling and educating the patient, ordering medications, documenting clinical information in the electronic health record, and care coordination.   [name], MD Department of Hematology/Oncology [facility] [place] [facility] Phone: [phone] Pager: [phone] Email: [email or web address]  07/18/2022 8:18 PM

## 2022-08-05 ENCOUNTER — Inpatient Hospital Stay: Payer: 59

## 2022-08-05 ENCOUNTER — Other Ambulatory Visit: Payer: Self-pay

## 2022-08-05 ENCOUNTER — Inpatient Hospital Stay: Payer: 59 | Attending: Hematology and Oncology

## 2022-08-05 VITALS — BP 148/105 | HR 89 | Temp 97.8°F | Resp 16

## 2022-08-05 DIAGNOSIS — Z79899 Other long term (current) drug therapy: Secondary | ICD-10-CM | POA: Diagnosis not present

## 2022-08-05 DIAGNOSIS — C7A025 Malignant carcinoid tumor of the sigmoid colon: Secondary | ICD-10-CM | POA: Diagnosis present

## 2022-08-05 DIAGNOSIS — C7B03 Secondary carcinoid tumors of bone: Secondary | ICD-10-CM | POA: Insufficient documentation

## 2022-08-05 DIAGNOSIS — C7B8 Other secondary neuroendocrine tumors: Secondary | ICD-10-CM

## 2022-08-05 LAB — CBC WITH DIFFERENTIAL (CANCER CENTER ONLY)
Abs Immature Granulocytes: 0.01 10*3/uL (ref 0.00–0.07)
Basophils Absolute: 0 10*3/uL (ref 0.0–0.1)
Basophils Relative: 0 %
Eosinophils Absolute: 0.1 10*3/uL (ref 0.0–0.5)
Eosinophils Relative: 2 %
HCT: 38.3 % (ref 36.0–46.0)
Hemoglobin: 12.8 g/dL (ref 12.0–15.0)
Immature Granulocytes: 0 %
Lymphocytes Relative: 38 %
Lymphs Abs: 2.3 10*3/uL (ref 0.7–4.0)
MCH: 28.1 pg (ref 26.0–34.0)
MCHC: 33.4 g/dL (ref 30.0–36.0)
MCV: 84 fL (ref 80.0–100.0)
Monocytes Absolute: 0.5 10*3/uL (ref 0.1–1.0)
Monocytes Relative: 9 %
Neutro Abs: 3 10*3/uL (ref 1.7–7.7)
Neutrophils Relative %: 51 %
Platelet Count: 242 10*3/uL (ref 150–400)
RBC: 4.56 MIL/uL (ref 3.87–5.11)
RDW: 13.7 % (ref 11.5–15.5)
WBC Count: 5.9 10*3/uL (ref 4.0–10.5)
nRBC: 0 % (ref 0.0–0.2)

## 2022-08-05 LAB — CMP (CANCER CENTER ONLY)
ALT: 16 U/L (ref 0–44)
AST: 15 U/L (ref 15–41)
Albumin: 4.2 g/dL (ref 3.5–5.0)
Alkaline Phosphatase: 66 U/L (ref 38–126)
Anion gap: 8 (ref 5–15)
BUN: 11 mg/dL (ref 6–20)
CO2: 28 mmol/L (ref 22–32)
Calcium: 9.5 mg/dL (ref 8.9–10.3)
Chloride: 103 mmol/L (ref 98–111)
Creatinine: 0.66 mg/dL (ref 0.44–1.00)
GFR, Estimated: >60 mL/min (ref >60)
Glucose, Bld: 128 mg/dL — ABNORMAL HIGH (ref 70–99)
Potassium: 3.6 mmol/L (ref 3.5–5.1)
Sodium: 139 mmol/L (ref 135–145)
Total Bilirubin: 0.5 mg/dL (ref 0.3–1.2)
Total Protein: 7.3 g/dL (ref 6.5–8.1)

## 2022-08-05 MED ORDER — LANREOTIDE ACETATE 120 MG/0.5ML ~~LOC~~ SOLN
120.0000 mg | Freq: Once | SUBCUTANEOUS | Status: AC
  Administered 2022-08-05: 120 mg via SUBCUTANEOUS
  Filled 2022-08-05: qty 120

## 2022-08-05 NOTE — Patient Instructions (Signed)
Lanreotide Injection What is this medication? LANREOTIDE (lan REE oh tide) treats high levels of growth hormone (acromegaly). It is used when other therapies have not worked well enough or cannot be tolerated. It works by reducing the amount of growth hormone your body makes. This reduces symptoms and the risk of health problems caused by too much growth hormone, such as diabetes and heart disease. It may also be used to treat neuroendocrine tumors, a cancer of the cells that release hormones and other substances in your body. It works by slowing down the release of these substances from the cells. This slows tumor growth. It also decreases the symptoms of carcinoid syndrome, such as flushing or diarrhea. This medicine may be used for other purposes; ask your health care provider or pharmacist if you have questions. COMMON BRAND NAME(S): Somatuline Depot What should I tell my care team before I take this medication? They need to know if you have any of these conditions: Diabetes Gallbladder disease Heart disease Kidney disease Liver disease Thyroid disease An unusual or allergic reaction to lanreotide, other medications, foods, dyes, or preservatives Pregnant or trying to get pregnant Breast-feeding How should I use this medication? This medication is injected under the skin. It is given by your care team in a hospital or clinic setting. Talk to your care team about the use of this medication in children. Special care may be needed. Overdosage: If you think you have taken too much of this medicine contact a poison control center or emergency room at once. NOTE: This medicine is only for you. Do not share this medicine with others. What if I miss a dose? Keep appointments for follow-up doses. It is important not to miss your dose. Call your care team if you are unable to keep an appointment. What may interact with this medication? Bromocriptine Cyclosporine Certain medications for blood  pressure, heart disease, irregular heartbeat Certain medications for diabetes Quinidine Terfenadine This list may not describe all possible interactions. Give your health care provider a list of all the medicines, herbs, non-prescription drugs, or dietary supplements you use. Also tell them if you smoke, drink alcohol, or use illegal drugs. Some items may interact with your medicine. What should I watch for while using this medication? Visit your care team for regular checks on your progress. Tell your care team if your symptoms do not start to get better or if they get worse. Your condition will be monitored carefully while you are receiving this medication. You may need blood work while you are taking this medication. This medication may increase blood sugar. The risk may be higher in patients who already have diabetes. Ask your care team what you can do to lower your risk of diabetes while taking this medication. Talk to your care team if you wish to become pregnant or think you may be pregnant. This medication can cause serious birth defects. Do not breast-feed while taking this medication and for 6 months after stopping therapy. This medication may cause infertility. Talk to your care team if you are concerned about your fertility. What side effects may I notice from receiving this medication? Side effects that you should report to your care team as soon as possible: Allergic reactions--skin rash, itching, hives, swelling of the face, lips, tongue, or throat Gallbladder problems--severe stomach pain, nausea, vomiting, fever High blood sugar (hyperglycemia)--increased thirst or amount of urine, unusual weakness or fatigue, blurry vision Increase in blood pressure Low blood sugar (hypoglycemia)--tremors or shaking, anxiety, sweating, cold   or clammy skin, confusion, dizziness, rapid heartbeat Low thyroid levels (hypothyroidism)--unusual weakness or fatigue, increased sensitivity to cold,  constipation, hair loss, dry skin, weight gain, feelings of depression Slow heartbeat--dizziness, feeling faint or lightheaded, confusion, trouble breathing, unusual weakness or fatigue Side effects that usually do not require medical attention (report to your care team if they continue or are bothersome): Diarrhea Dizziness Headache Muscle spasms Nausea Pain, redness, irritation, or bruising at the injection site Stomach pain This list may not describe all possible side effects. Call your doctor for medical advice about side effects. You may report side effects to FDA at 1-800-FDA-1088. Where should I keep my medication? This medication is given in a hospital or clinic. It will not be stored at home. NOTE: This sheet is a summary. It may not cover all possible information. If you have questions about this medicine, talk to your doctor, pharmacist, or health care provider.  2023 Elsevier/Gold Standard (2021-08-20 00:00:00)  

## 2022-08-09 LAB — CHROMOGRANIN A: Chromogranin A (ng/mL): 20.7 ng/mL (ref 0.0–101.8)

## 2022-08-17 ENCOUNTER — Ambulatory Visit: Payer: 59 | Admitting: Nurse Practitioner

## 2022-08-29 ENCOUNTER — Ambulatory Visit (INDEPENDENT_AMBULATORY_CARE_PROVIDER_SITE_OTHER): Payer: 59 | Admitting: Nurse Practitioner

## 2022-08-29 ENCOUNTER — Encounter: Payer: Self-pay | Admitting: Nurse Practitioner

## 2022-08-29 VITALS — BP 138/84 | HR 99 | Ht 63.0 in | Wt 183.9 lb

## 2022-08-29 DIAGNOSIS — Z01419 Encounter for gynecological examination (general) (routine) without abnormal findings: Secondary | ICD-10-CM

## 2022-08-29 DIAGNOSIS — N9489 Other specified conditions associated with female genital organs and menstrual cycle: Secondary | ICD-10-CM

## 2022-08-29 DIAGNOSIS — Z780 Asymptomatic menopausal state: Secondary | ICD-10-CM | POA: Diagnosis not present

## 2022-08-29 NOTE — Progress Notes (Signed)
Michelle Solomon February 05, 1961 [id number]   History:  62 y.o. G3P3 presents for annual exam. Postmenopausal - no HRT, no bleeding. Normal pap history. History of metastatic neuroendocrine tumor of colon, receiving Lanreotide monthly, finished radiation a year ago, managed by oncology. 06/07/2022 CT abdomen/pelvis completed by Dr. Rexene Alberts with [facility] for evaluation of microhematuria. CT scan showed low-density 3.9 cm lesion in right adnexal space, posterior to the lower uterine segment is probably exophytic fibroid but cystic ovarian mass lesion not excluded. Uterine fibroids.   Gynecologic History Patient's last menstrual period was 10/26/2017.   Contraception/Family planning: post menopausal status Sexually active: No  Health Maintenance Last Pap: 10/19/2020. Results were: Normal neg HPV, 5-year repeat Last mammogram: 09/23/2021. Results were: Multiple cysts, Ultrasound neg for malignancy Last colonoscopy: 11/12/2019. Results were: Tubular adenoma, 5-year recall Last Dexa: Not indicated  Past medical history, past surgical history, family history and social history were all reviewed and documented in the EPIC chart. Widowed. Works at [employer]. 2 grandchildren ages 108 and 81.  ROS:  A ROS was performed and pertinent positives and negatives are included.  Exam:  Vitals:   08/29/22 1035  BP: 138/84  Pulse: 99  SpO2: 100%  Weight: 183 lb 14.4 oz (83.4 kg)  Height: '5\' 3"'$  (1.6 m)    Body mass index is 32.58 kg/m.  General appearance:  Normal Thyroid:  Symmetrical, normal in size, without palpable masses or nodularity. Respiratory  Auscultation:  Clear without wheezing or rhonchi Cardiovascular  Auscultation:  Regular rate, without rubs, murmurs or gallops  Edema/varicosities:  Not grossly evident Abdominal  Soft,nontender, without masses, guarding or rebound.  Liver/spleen:  No organomegaly noted  Hernia:  None appreciated  Skin  Inspection:  Grossly normal Breasts:  Examined lying and sitting.   Right: Without masses, retractions, nipple discharge or axillary adenopathy.  Left: Without masses, retractions, nipple discharge or axillary adenopathy. Gentitourinary   Inguinal/mons:  Normal without inguinal adenopathy  External genitalia:  Normal appearing vulva with no masses, tenderness, or lesions  BUS/Urethra/Skene's glands:  Normal  Vagina:  Normal appearing with normal color and discharge, no lesions  Cervix:  Normal appearing without discharge or lesions  Uterus:  Normal in size, shape and contour.  Midline and mobile, nontender  Adnexa/parametria:     Rt: Normal in size, without masses or tenderness.   Lt: Normal in size, without masses or tenderness.  Anus and perineum: Normal  Digital rectal exam: Deferred  Patient informed chaperone available to be present for breast and pelvic exam. Patient has requested no chaperone to be present. Patient has been advised what will be completed during breast and pelvic exam.   Assessment/Plan:  62 y.o. G3P3 for annual exam.   Well female exam with routine gynecological exam - Education provided on SBEs, importance of preventative screenings, current guidelines, high calcium diet, regular exercise, and multivitamin daily. Labs with PCP.   Postmenopausal - no HRT, no bleeding.   Adnexal mass - Plan: US PELVIS TRANSVAGINAL NON-OB (TV ONLY). 06/07/2022 CT scan showed low-density 3.9 cm lesion in right adnexal space, posterior to the lower uterine segment is probably exophytic fibroid but cystic ovarian mass lesion not excluded. Uterine fibroids. Ultrasound for confirmation.  Screening for cervical cancer - Normal Pap history. Will repeat at 5-year interval per guidelines.   Screening for breast cancer - History of benign cysts and calcifications.  Continue annual screenings.  Normal breast exam today.   Screening for colon cancer - 2021 colonoscopy. Will repeat at  GI's recommended interval.   Return in 1 year  for annual.   Tamela Gammon DNP, 12:21 PM 08/29/2022

## 2022-09-16 ENCOUNTER — Inpatient Hospital Stay: Payer: 59 | Attending: Hematology and Oncology

## 2022-09-16 ENCOUNTER — Other Ambulatory Visit: Payer: Self-pay

## 2022-09-16 ENCOUNTER — Inpatient Hospital Stay: Payer: 59

## 2022-09-16 ENCOUNTER — Encounter: Payer: Self-pay | Admitting: Hematology and Oncology

## 2022-09-16 VITALS — BP 151/94 | HR 80 | Temp 98.5°F | Resp 16

## 2022-09-16 DIAGNOSIS — Z79899 Other long term (current) drug therapy: Secondary | ICD-10-CM | POA: Diagnosis not present

## 2022-09-16 DIAGNOSIS — C7A025 Malignant carcinoid tumor of the sigmoid colon: Secondary | ICD-10-CM | POA: Diagnosis present

## 2022-09-16 DIAGNOSIS — C7B8 Other secondary neuroendocrine tumors: Secondary | ICD-10-CM

## 2022-09-16 DIAGNOSIS — C7B03 Secondary carcinoid tumors of bone: Secondary | ICD-10-CM | POA: Insufficient documentation

## 2022-09-16 LAB — CBC WITH DIFFERENTIAL (CANCER CENTER ONLY)
Abs Immature Granulocytes: 0.01 10*3/uL (ref 0.00–0.07)
Basophils Absolute: 0 10*3/uL (ref 0.0–0.1)
Basophils Relative: 0 %
Eosinophils Absolute: 0.1 10*3/uL (ref 0.0–0.5)
Eosinophils Relative: 2 %
HCT: 35.5 % — ABNORMAL LOW (ref 36.0–46.0)
Hemoglobin: 11.5 g/dL — ABNORMAL LOW (ref 12.0–15.0)
Immature Granulocytes: 0 %
Lymphocytes Relative: 42 %
Lymphs Abs: 2.7 10*3/uL (ref 0.7–4.0)
MCH: 28 pg (ref 26.0–34.0)
MCHC: 32.4 g/dL (ref 30.0–36.0)
MCV: 86.4 fL (ref 80.0–100.0)
Monocytes Absolute: 0.6 10*3/uL (ref 0.1–1.0)
Monocytes Relative: 9 %
Neutro Abs: 3.1 10*3/uL (ref 1.7–7.7)
Neutrophils Relative %: 47 %
Platelet Count: 225 10*3/uL (ref 150–400)
RBC: 4.11 MIL/uL (ref 3.87–5.11)
RDW: 13.6 % (ref 11.5–15.5)
WBC Count: 6.6 10*3/uL (ref 4.0–10.5)
nRBC: 0 % (ref 0.0–0.2)

## 2022-09-16 LAB — CMP (CANCER CENTER ONLY)
ALT: 15 U/L (ref 0–44)
AST: 15 U/L (ref 15–41)
Albumin: 3.9 g/dL (ref 3.5–5.0)
Alkaline Phosphatase: 55 U/L (ref 38–126)
Anion gap: 5 (ref 5–15)
BUN: 15 mg/dL (ref 6–20)
CO2: 30 mmol/L (ref 22–32)
Calcium: 9 mg/dL (ref 8.9–10.3)
Chloride: 108 mmol/L (ref 98–111)
Creatinine: 0.73 mg/dL (ref 0.44–1.00)
GFR, Estimated: >60 mL/min (ref >60)
Glucose, Bld: 91 mg/dL (ref 70–99)
Potassium: 3.6 mmol/L (ref 3.5–5.1)
Sodium: 143 mmol/L (ref 135–145)
Total Bilirubin: 0.3 mg/dL (ref 0.3–1.2)
Total Protein: 6.9 g/dL (ref 6.5–8.1)

## 2022-09-16 MED ORDER — LANREOTIDE ACETATE 120 MG/0.5ML ~~LOC~~ SOLN
120.0000 mg | Freq: Once | SUBCUTANEOUS | Status: AC
  Administered 2022-09-16: 120 mg via SUBCUTANEOUS
  Filled 2022-09-16: qty 120

## 2022-09-16 NOTE — Patient Instructions (Signed)
Lanreotide Injection What is this medication? LANREOTIDE (lan REE oh tide) treats high levels of growth hormone (acromegaly). It is used when other therapies have not worked well enough or cannot be tolerated. It works by reducing the amount of growth hormone your body makes. This reduces symptoms and the risk of health problems caused by too much growth hormone, such as diabetes and heart disease. It may also be used to treat neuroendocrine tumors, a cancer of the cells that release hormones and other substances in your body. It works by slowing down the release of these substances from the cells. This slows tumor growth. It also decreases the symptoms of carcinoid syndrome, such as flushing or diarrhea. This medicine may be used for other purposes; ask your health care provider or pharmacist if you have questions. COMMON BRAND NAME(S): Somatuline Depot What should I tell my care team before I take this medication? They need to know if you have any of these conditions: Diabetes Gallbladder disease Heart disease Kidney disease Liver disease Thyroid disease An unusual or allergic reaction to lanreotide, other medications, foods, dyes, or preservatives Pregnant or trying to get pregnant Breast-feeding How should I use this medication? This medication is injected under the skin. It is given by your care team in a hospital or clinic setting. Talk to your care team about the use of this medication in children. Special care may be needed. Overdosage: If you think you have taken too much of this medicine contact a poison control center or emergency room at once. NOTE: This medicine is only for you. Do not share this medicine with others. What if I miss a dose? Keep appointments for follow-up doses. It is important not to miss your dose. Call your care team if you are unable to keep an appointment. What may interact with this medication? Bromocriptine Cyclosporine Certain medications for blood  pressure, heart disease, irregular heartbeat Certain medications for diabetes Quinidine Terfenadine This list may not describe all possible interactions. Give your health care provider a list of all the medicines, herbs, non-prescription drugs, or dietary supplements you use. Also tell them if you smoke, drink alcohol, or use illegal drugs. Some items may interact with your medicine. What should I watch for while using this medication? Visit your care team for regular checks on your progress. Tell your care team if your symptoms do not start to get better or if they get worse. Your condition will be monitored carefully while you are receiving this medication. You may need blood work while you are taking this medication. This medication may increase blood sugar. The risk may be higher in patients who already have diabetes. Ask your care team what you can do to lower your risk of diabetes while taking this medication. Talk to your care team if you wish to become pregnant or think you may be pregnant. This medication can cause serious birth defects. Do not breast-feed while taking this medication and for 6 months after stopping therapy. This medication may cause infertility. Talk to your care team if you are concerned about your fertility. What side effects may I notice from receiving this medication? Side effects that you should report to your care team as soon as possible: Allergic reactions--skin rash, itching, hives, swelling of the face, lips, tongue, or throat Gallbladder problems--severe stomach pain, nausea, vomiting, fever High blood sugar (hyperglycemia)--increased thirst or amount of urine, unusual weakness or fatigue, blurry vision Increase in blood pressure Low blood sugar (hypoglycemia)--tremors or shaking, anxiety, sweating, cold   or clammy skin, confusion, dizziness, rapid heartbeat Low thyroid levels (hypothyroidism)--unusual weakness or fatigue, increased sensitivity to cold,  constipation, hair loss, dry skin, weight gain, feelings of depression Slow heartbeat--dizziness, feeling faint or lightheaded, confusion, trouble breathing, unusual weakness or fatigue Side effects that usually do not require medical attention (report to your care team if they continue or are bothersome): Diarrhea Dizziness Headache Muscle spasms Nausea Pain, redness, irritation, or bruising at the injection site Stomach pain This list may not describe all possible side effects. Call your doctor for medical advice about side effects. You may report side effects to FDA at 1-800-FDA-1088. Where should I keep my medication? This medication is given in a hospital or clinic. It will not be stored at home. NOTE: This sheet is a summary. It may not cover all possible information. If you have questions about this medicine, talk to your doctor, pharmacist, or health care provider.  2023 Elsevier/Gold Standard (2021-08-20 00:00:00)  

## 2022-09-21 LAB — CHROMOGRANIN A: Chromogranin A (ng/mL): 22.4 ng/mL (ref 0.0–101.8)

## 2022-09-23 ENCOUNTER — Other Ambulatory Visit: Payer: Self-pay | Admitting: Internal Medicine

## 2022-09-29 ENCOUNTER — Ambulatory Visit (INDEPENDENT_AMBULATORY_CARE_PROVIDER_SITE_OTHER): Payer: 59 | Admitting: Nurse Practitioner

## 2022-09-29 ENCOUNTER — Encounter: Payer: Self-pay | Admitting: Nurse Practitioner

## 2022-09-29 ENCOUNTER — Ambulatory Visit (INDEPENDENT_AMBULATORY_CARE_PROVIDER_SITE_OTHER): Payer: 59

## 2022-09-29 VITALS — BP 122/82 | HR 73

## 2022-09-29 DIAGNOSIS — N9489 Other specified conditions associated with female genital organs and menstrual cycle: Secondary | ICD-10-CM | POA: Diagnosis not present

## 2022-09-29 DIAGNOSIS — D252 Subserosal leiomyoma of uterus: Secondary | ICD-10-CM

## 2022-09-29 DIAGNOSIS — D251 Intramural leiomyoma of uterus: Secondary | ICD-10-CM

## 2022-09-29 LAB — HM MAMMOGRAPHY

## 2022-09-29 NOTE — Progress Notes (Signed)
   Acute Office Visit  Subjective:    Patient ID: Michelle Solomon, female    DOB: 01/01/1961, 62 y.o.   MRN: [id number]   HPI 62 y.o. presents today for ultrasound. Seen 08/29/2022 for annual exam. At that time she reported having a CT abdomen/pelvis 06/07/2022 completed by Dr. Rexene Alberts with [facility] for evaluation of microhematuria. CT scan showed low-density 3.9 cm lesion in right adnexal space, posterior to the lower uterine segment is probably exophytic fibroid but cystic ovarian mass lesion not excluded. Ultrasound to confirm today. Daughter present during visit.    Review of Systems  Constitutional: Negative.   Genitourinary: Negative.        Objective:    Physical Exam Constitutional:      Appearance: Normal appearance.   GU: Not indicated  BP 122/82 (BP Location: Right Arm, Patient Position: Sitting, Cuff Size: Normal)   Pulse 73   LMP 10/26/2017   SpO2 100%  Wt Readings from Last 3 Encounters:  08/29/22 183 lb 14.4 oz (83.4 kg)  07/08/22 178 lb 1.6 oz (80.8 kg)  04/15/22 179 lb 14.4 oz (81.6 kg)       Assessment & Plan:   Problem List Items Addressed This Visit   None Visit Diagnoses     Intramural and subserous leiomyoma of uterus    -  Primary      Vaginal ultrasound: Anteverted enlarged uterus, several intramural and subserous fibroids. Largest measuring 4.59 cm.  Thin, symmetrical endometrium - 2.5 mm.  No masses or thickening seen.  Both ovaries identified, small with atrophic appearance.  No adnexal masses, no free fluid.  Plan: Ultrasound reviewed with patient. Mass seen on CT scan confirmed to be fibroid. No follow up needed.    [place], 3:16 PM 09/29/2022

## 2022-10-12 ENCOUNTER — Encounter: Payer: Self-pay | Admitting: Hematology and Oncology

## 2022-10-12 ENCOUNTER — Telehealth: Payer: Self-pay | Admitting: *Deleted

## 2022-10-12 NOTE — Telephone Encounter (Signed)
Received call from patient. She  states she has been diagnosed with a meningioma in her right frontal area of her brain. She had several questions related to any potential neuro deficits post op as the surgeon is recommending surgical removal of this.  Advised that pt really needs to discuss this with the surgeon as this is his specialty, not ours. Encouraged pt to call his office.  Pt is coming here on 10/14/22 to see Michelle Kaufmann, PA

## 2022-10-13 ENCOUNTER — Encounter: Payer: Self-pay | Admitting: Hematology and Oncology

## 2022-10-14 ENCOUNTER — Inpatient Hospital Stay: Payer: 59 | Attending: Hematology and Oncology | Admitting: Physician Assistant

## 2022-10-14 ENCOUNTER — Other Ambulatory Visit: Payer: Self-pay | Admitting: Physician Assistant

## 2022-10-14 ENCOUNTER — Inpatient Hospital Stay: Payer: 59

## 2022-10-14 ENCOUNTER — Other Ambulatory Visit: Payer: Self-pay

## 2022-10-14 ENCOUNTER — Other Ambulatory Visit (HOSPITAL_COMMUNITY): Payer: Self-pay | Admitting: Physician Assistant

## 2022-10-14 VITALS — BP 143/83 | HR 89 | Temp 98.2°F | Resp 18 | Ht 63.0 in | Wt 177.8 lb

## 2022-10-14 DIAGNOSIS — Z79899 Other long term (current) drug therapy: Secondary | ICD-10-CM | POA: Insufficient documentation

## 2022-10-14 DIAGNOSIS — C7B03 Secondary carcinoid tumors of bone: Secondary | ICD-10-CM | POA: Insufficient documentation

## 2022-10-14 DIAGNOSIS — C7B8 Other secondary neuroendocrine tumors: Secondary | ICD-10-CM | POA: Diagnosis not present

## 2022-10-14 DIAGNOSIS — C7A8 Other malignant neuroendocrine tumors: Secondary | ICD-10-CM

## 2022-10-14 DIAGNOSIS — C7A025 Malignant carcinoid tumor of the sigmoid colon: Secondary | ICD-10-CM | POA: Diagnosis present

## 2022-10-14 LAB — CBC WITH DIFFERENTIAL (CANCER CENTER ONLY)
Abs Immature Granulocytes: 0.01 10*3/uL (ref 0.00–0.07)
Basophils Absolute: 0 10*3/uL (ref 0.0–0.1)
Basophils Relative: 1 %
Eosinophils Absolute: 0.1 10*3/uL (ref 0.0–0.5)
Eosinophils Relative: 2 %
HCT: 37.9 % (ref 36.0–46.0)
Hemoglobin: 12.8 g/dL (ref 12.0–15.0)
Immature Granulocytes: 0 %
Lymphocytes Relative: 42 %
Lymphs Abs: 2.7 10*3/uL (ref 0.7–4.0)
MCH: 28.3 pg (ref 26.0–34.0)
MCHC: 33.8 g/dL (ref 30.0–36.0)
MCV: 83.7 fL (ref 80.0–100.0)
Monocytes Absolute: 0.6 10*3/uL (ref 0.1–1.0)
Monocytes Relative: 10 %
Neutro Abs: 3 10*3/uL (ref 1.7–7.7)
Neutrophils Relative %: 45 %
Platelet Count: 253 10*3/uL (ref 150–400)
RBC: 4.53 MIL/uL (ref 3.87–5.11)
RDW: 13.3 % (ref 11.5–15.5)
WBC Count: 6.5 10*3/uL (ref 4.0–10.5)
nRBC: 0 % (ref 0.0–0.2)

## 2022-10-14 LAB — CMP (CANCER CENTER ONLY)
ALT: 23 U/L (ref 0–44)
AST: 16 U/L (ref 15–41)
Albumin: 4.4 g/dL (ref 3.5–5.0)
Alkaline Phosphatase: 59 U/L (ref 38–126)
Anion gap: 7 (ref 5–15)
BUN: 13 mg/dL (ref 6–20)
CO2: 29 mmol/L (ref 22–32)
Calcium: 9.8 mg/dL (ref 8.9–10.3)
Chloride: 104 mmol/L (ref 98–111)
Creatinine: 0.83 mg/dL (ref 0.44–1.00)
GFR, Estimated: >60 mL/min (ref >60)
Glucose, Bld: 127 mg/dL — ABNORMAL HIGH (ref 70–99)
Potassium: 3.7 mmol/L (ref 3.5–5.1)
Sodium: 140 mmol/L (ref 135–145)
Total Bilirubin: 0.6 mg/dL (ref 0.3–1.2)
Total Protein: 7.8 g/dL (ref 6.5–8.1)

## 2022-10-14 MED ORDER — LANREOTIDE ACETATE 120 MG/0.5ML ~~LOC~~ SOLN
120.0000 mg | Freq: Once | SUBCUTANEOUS | Status: AC
  Administered 2022-10-14: 120 mg via SUBCUTANEOUS
  Filled 2022-10-14: qty 120

## 2022-10-14 NOTE — Progress Notes (Signed)
[facility] [phone]   [phone]  PROGRESS NOTE  Patient Care Team: Corwin Levins, MD as PCP - General  Hematological/Oncological History # Metastatic Neuroendocrine Tumor, Metastasis to the Spine 1) 08/26/2019: CT Abdomen Pelvis performed due to RUQ abdominal pain. Imaging showed a soft tissue mass abutting the right posterior eighth rib 2) 09/06/2019: CT chest performed which showed Ill-defined soft tissue nodule along the medial right posterior thoracic wall at the 8th intercostal space to the right of the spine. MRI was recommended. 3) 10/19/2019: MRI Thoracic spine showed enhancing lesions throughout the visualized spine consistent with metastatic disease. Additionally there was a 1.5 cm soft tissue nodule in the posteromedial right eighth intercostal space, more concerning for a metastasis 4) 10/25/2019: establish care with Dr. Leonides Schanz   5) 11/05/2019: attempted biopsy of soft tissue mass at right posterior 8th rib, but lesion had dissipated at time of biopsy attempt 6) 11/27/2019: PET CT scan performed showing right inguinal lymph nodes and osseous lesions, indicative of metastatic disease of unknown primary. 7) 12/11/2019: CT bone marrow biopsy showed metastatic neuroendocrine neoplasm 8) 12/27/2019: start of lanreotide 120mg  sub q28 days  9) 10/15/2020: NM PET (CU-64) scan showed widespread osseous and lymphatic uptake consistent with metastatic spread of neuroendocrine tumor.  This is more avid than prior. 01/20/2021: clinic visit with Dr. Nolen Mu due to concern for possible mantle cell lymphoma seen on pathology review of previous biopsy 04/15/2021: Dose 1 (of 4) of lutathera treatment with nuclear medicine.  06/10/2021: Dose 2 (of 4) of lutathera treatment with nuclear medicine.  08/05/2021: Dose 3 (of 4) of lutathera treatment with nuclear medicine.  09/03/2021:Dose 4 (of 4) of lutathera treatment with nuclear medicine.  11/26/2021: NM Copper Dotatate Scan  shows dramatic reduction in number and activity of skeletal metastasis.   #Low Grade Neuroendocrine Tumor 1) 2006: reportedly had rectal carcinoid tumor removed 2) 08/14/2008: patient had a flexibile sigmoidoscopy with endoscopic ultrasound which resected an 8mm, subepithelial lesion in rectum. Findings consistent with low grade neuroendocrine tumor. 3) 04/08/2010: repeat colonoscopy, no residual tumor. Repeat recommended in 2016.  4) 12/11/2019: metastatic recurrence found on biopsy, noted above. 5) 11/26/2021:  NM PET CU-64 showed a dramatic reduction in the number and activity as skeletal metastases.  #Mantle Cell Lymphoma 11/24/2020: incidental findings of low level mantle cell lymphoma on bone marrow biopsy. Noted on outside review of pathology at [facility].  01/20/2021: clinic visit with Dr. Nolen Mu due to concern for possible mantle cell lymphoma seen on pathology review of previous biopsy  Interval History:  Michelle Solomon 62 y.o. female with medical history significant for metastatic neuroendocrine tumor of the colon who presents for a follow up visit. The patient's last visit was on 07/08/2022. In the interim since the last visit she has continued on monthly lanreotide. Additionally, she underwent MRI brain at [facility] on 08/09/2022 that revealed a mass in the right frontal lobe concerning for meningioma.   On exam today Michelle Solomon reports she is stressed about the MRI findings and needing surgery. She was seen by [facility] who recommended surgical resection. She is planning to obtain a second opinion at [facility] later this month. She is other wise feeling stable with no changes in her energy or appetite. She reports her vision is stable with some blurriness. She does have night sweats but denies any flushing episodes. She denies any issues with nausea, vomiting, or diarrhea.  She denies any fevers, chills, shortness of breath, chest pain, palpitations  or cough. She has no other  complaints.   A full 10 point ROS is listed below.  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   Cancer (HCC)    Neuro endocrine tumor   CONJUNCTIVITIS, ALLERGIC 11/27/2008   HEMORRHOIDS 06/16/2008   HTN (hypertension)    Hx of adenomatous colonic polyps 11/2019   NIPPLE DISCHARGE 01/21/2010   Other specified forms of hearing loss 10/08/2009   Overweight(278.02) 05/29/2007   RASH-NONVESICULAR 05/29/2007   Wheezing 06/22/2010    SURGICAL HISTORY: Past Surgical History:  Procedure Laterality Date   COLONOSCOPY      SOCIAL HISTORY: Social History   Socioeconomic History   Marital status: Widowed    Spouse name: Not on file   Number of children: 3   Years of education: Not on file   Highest education level: Not on file  Occupational History   Occupation: CARD REPLACEMEN    Employer: [employer]   Occupation: customer service  Tobacco Use   Smoking status: Some Days    Packs/day: 0.50    Years: 40.00    Additional pack years: 0.00    Total pack years: 20.00    Types: Cigarettes    Last attempt to quit: 10/22/2019    Years since quitting: 2.9   Smokeless tobacco: Never   Tobacco comments:    1 pack weekly.  started smoking at 62 years old  Vaping Use   Vaping Use: Never used  Substance and Sexual Activity   Alcohol use: Yes    Comment: ocassional   Drug use: No   Sexual activity: Not Currently  Other Topics Concern   Not on file  Social History Narrative   Daily caffeine use 3   Patient does not get regular exercise   Social Determinants of Health   Financial Resource Strain: Not on file  Food Insecurity: Not on file  Transportation Needs: Not on file  Physical Activity: Not on file  Stress: Not  on file  Social Connections: Not on file  Intimate Partner Violence: Not on file    FAMILY HISTORY: Family History  Problem Relation Age of Onset   Allergies Sister    Diabetes Sister    Hypertension Sister    Hypertension Mother    Prostate cancer Father    Hypertension Brother    Hypertension Sister    Diabetes Paternal Aunt    Cancer Paternal Aunt        type unknown   Prostate cancer Brother    Stroke Maternal Aunt    Hypertension Maternal Aunt    Colon cancer Neg Hx    Esophageal cancer Neg Hx    Rectal cancer Neg Hx    Stomach cancer Neg Hx     ALLERGIES:  is allergic to shellfish allergy.  MEDICATIONS:  Current Outpatient Medications  Medication Sig Dispense Refill   buPROPion (WELLBUTRIN XL) 300 MG 24 hr tablet TAKE 1 TABLET(300 MG) BY MOUTH DAILY 90 tablet 3   cholecalciferol (VITAMIN D3) 25 MCG (1000 UNIT) tablet Take 1,000 Units by mouth daily.     hydrochlorothiazide (HYDRODIURIL) 12.5 MG tablet TAKE 1 TABLET(12.5 MG) BY MOUTH DAILY 30 tablet 5   hydrocortisone  2.5 % cream Apply topically 2 (two) times daily. 30 g 1   lanreotide acetate (SOMATULINE DEPOT) 120 MG/0.5ML injection Inject 120 mg into the skin every 28 (twenty-eight) days. 0.5 mL 6   lanreotide acetate (SOMATULINE DEPOT) 120 MG/0.5ML injection Inject 120 mg into the skin every 28 (twenty-eight) days. 0.5 mL 12   loratadine (CLARITIN) 10 MG tablet Take 10 mg by mouth daily as needed for allergies.     losartan (COZAAR) 50 MG tablet Take 1 tablet (50 mg total) by mouth daily. 90 tablet 3   ondansetron (ZOFRAN) 8 MG tablet Take 1 tablet (8 mg total) by mouth 2 (two) times daily as needed for nausea or vomiting. 20 tablet 0   oxyCODONE-acetaminophen (PERCOCET/ROXICET) 5-325 MG tablet Take 1 tablet by mouth every 6 (six) hours as needed for severe pain. 30 tablet 0   Turmeric 500 MG CAPS Take 500 mg by mouth daily.     VITAMIN D-VITAMIN K PO Take 1 tablet by mouth daily.     iron polysaccharides  (NU-IRON) 150 MG capsule Take 1 capsule (150 mg total) by mouth daily. (Patient not taking: Reported on 10/14/2022) 90 capsule 1   vitamin B-12 (CYANOCOBALAMIN) 100 MCG tablet Take 100 mcg by mouth daily. (Patient not taking: Reported on 10/14/2022)     zolpidem (AMBIEN) 10 MG tablet Take 1 tablet (10 mg total) by mouth at bedtime as needed for sleep. 90 tablet 1   No current facility-administered medications for this visit.   Facility-Administered Medications Ordered in Other Visits  Medication Dose Route Frequency Provider Last Rate Last Admin   octreotide (SANDOSTATIN LAR) 30 MG IM injection             REVIEW OF SYSTEMS:   Constitutional: ( - ) fevers, ( - )  chills , ( - ) night sweats Eyes: ( - ) blurriness of vision, ( - ) double vision, ( - ) watery eyes Ears, nose, mouth, throat, and face: ( - ) mucositis, ( - ) sore throat Respiratory: ( - ) cough, ( - ) dyspnea, ( - ) wheezes Cardiovascular: ( - ) palpitation, ( - ) chest discomfort, ( - ) lower extremity swelling Gastrointestinal:  ( - ) nausea, ( - ) heartburn, ( - ) change in bowel habits Skin: ( - ) abnormal skin rashes Lymphatics: ( - ) new lymphadenopathy, ( - ) easy bruising Neurological: ( - ) numbness, ( - ) tingling, ( - ) new weaknesses Behavioral/Psych: ( - ) mood change, ( - ) new changes  All other systems were reviewed with the patient and are negative.  PHYSICAL EXAMINATION: ECOG PERFORMANCE STATUS: 0 - Asymptomatic  Vitals:   10/14/22 0842  BP: (!) 143/83  Pulse: 89  Resp: 18  Temp: 98.2 F (36.8 C)  SpO2: 100%     Filed Weights   10/14/22 0842  Weight: 177 lb 12.8 oz (80.6 kg)   GENERAL: well appearing middle aged [place] American female in NAD  SKIN: skin color, texture, turgor are normal, no rashes or significant lesions EYES: conjunctiva are pink and non-injected, sclera clear LUNGS: clear to auscultation and percussion with normal breathing effort HEART: regular rate & rhythm and no murmurs  and no lower extremity edema Musculoskeletal: no cyanosis of digits and no clubbing  PSYCH: alert & oriented x 3, fluent speech NEURO: no focal motor/sensory deficits  LABORATORY DATA:  I have reviewed the data as listed    Latest Ref Rng & Units 10/14/2022  8:19 AM 09/16/2022    8:16 AM 08/05/2022    9:00 AM  CBC  WBC 4.0 - 10.5 K/uL 6.5  6.6  5.9   Hemoglobin 12.0 - 15.0 g/dL 16.1  09.6  04.5   Hematocrit 36.0 - 46.0 % 37.9  35.5  38.3   Platelets 150 - 400 K/uL 253  225  242        Latest Ref Rng & Units 10/14/2022    8:19 AM 09/16/2022    8:16 AM 08/05/2022    9:00 AM  CMP  Glucose 70 - 99 mg/dL 409  91  811   BUN 6 - 20 mg/dL Creatinine 0.44 - 1.00 mg/dL 9.14  7.82  9.56   Sodium 135 - 145 mmol/L 140  143  139   Potassium 3.5 - 5.1 mmol/L 3.7  3.6  3.6   Chloride 98 - 111 mmol/L 104  108  103   CO2 22 - 32 mmol/L Calcium 8.9 - 10.3 mg/dL 9.8  9.0  9.5   Total Protein 6.5 - 8.1 g/dL 7.8  6.9  7.3   Total Bilirubin 0.3 - 1.2 mg/dL 0.6  0.3  0.5   Alkaline Phos 38 - 126 U/L 59  55  66   AST 15 - 41 U/L ALT 0 - 44 U/L RADIOGRAPHIC STUDIES: I have personally reviewed the radiological images as listed and agreed with the findings in the report.  US PELVIS TRANSVAGINAL NON-OB (TV ONLY)  Result Date: 09/29/2022 Vaginal ultrasound: Anteverted enlarged uterus, several intramural and subserous fibroids. Largest measuring 4.59 cm.  Thin, symmetrical endometrium - 2.5 mm.  No masses or thickening seen.  Both ovaries identified, small with atrophic appearance.  No adnexal masses, no free fluid.     ASSESSMENT & PLAN Michelle Solomon 62 y.o. female with medical history significant for metastatic neuroendocrine tumor of the colon who presents for a follow up visit.    # Metastatic Neuroendocrine Tumor, Metastasis to the Spine  --previously discussed with patient that the options would include observation with serial imaging or  starting lanreotide therapy. The patient opted to start treatment --will plan for continued lanreotide  subq q28 days. Started therapy on 12/27/2019. This will be continued until progression or intolerance to therapy. Next dose due on 02/19/2021 --Due to the extensive involvement of her skeleton and increased activity on her last nuclear medicine scan it was recommended that she be considered for Lu 177 dotatate treatment. Initiated treatment on 04/15/2021 with plans to complete a total of 4 treatments.  --CU-64 scan q 3 months. Last in April 2022. Completed PET CT scan at [facility] on 02/02/2021, no evidence of lymphoma.  --Patient expressed interest in a second opinion.  She saw Dr. Lattie Corns at [facility].  He provided excellent advice regarding options moving forward.  After hearing all the different options the patient noted she would like to continue her current lanreotide shots with consideration of increased frequency if she were to be found to be progressing --patient underwent treatment with Lutathera therapy. She is s/p treatment 4 of 4. They administered 30 mg IM long acting sandostatin on days where she undergoes Lutathera treatment (q 8 weeks x 4 doses). Completed the 4th Lutathera treatment on 09/30/21  Plan:  --continue lanreotide 120 mg q 28 days with q  3 month zometa --Last Zometa infusion completed on 06/10/2022. Did not receive today, recommend at next infusion 11/11/2022.  --Labs today show white blood cell count 6.5, hemoglobin 12.8, MCV 83.7, and platelets of 253. Creatinine and LFTs normal. Chromogranin levels pending today. Last chromogranin level from 09/16/2022 was normal at 22.4. -- Will wait to see if [facility] will obtain repeat imaging since she will see neuroendocrine specialist on 10/30/2022 as well.  Otherwise we will obtain repeat imaging in May 2024.  --RTC in 12 weeks with interval continued monthly lanreotide injections.   #Mantle Cell Lymphoma -- Incidental  finding from prior biopsy results.  This was detected on secondary pathological review at [facility] when the patient went for second opinion --Unclear if this represents a small low-grade clinically insignificant population of mantle cell lymphoma or a more concerning issue --Patient following with Dr. Nolen Mu at [facility] --PET CT scan on 02/02/2021 showed no evidence of lymphoma.  --We will continue to monitor and appreciate the guidance of Dr. Joanell Rising meningioma: --Seen on MRI brain  from 08/09/2022 at [facility], measuring 3.6 cm along the right frontal convexity with broad-based dural attachment.  --Evaluated by Dr. Sharilyn Sites from [facility] who recommended surgical resection. --Patient is scheduled for a second opinion at [facility] on 10/30/2022  No orders of the defined types were placed in this encounter.  All questions were answered. The patient knows to call the clinic with any problems, questions or concerns.  I have spent a total of 30 minutes minutes of face-to-face and non-face-to-face time, preparing to see the patient,performing a medically appropriate examination, counseling and educating the patient, documenting clinical information in the electronic health record, and care coordination.   Georga Kaufmann PA-C Dept of Hematology and Oncology [facility] Phone: [phone]   10/14/2022 4:29 PM

## 2022-10-14 NOTE — Patient Instructions (Signed)
Lanreotide Injection What is this medication? LANREOTIDE (lan REE oh tide) treats high levels of growth hormone (acromegaly). It is used when other therapies have not worked well enough or cannot be tolerated. It works by reducing the amount of growth hormone your body makes. This reduces symptoms and the risk of health problems caused by too much growth hormone, such as diabetes and heart disease. It may also be used to treat neuroendocrine tumors, a cancer of the cells that release hormones and other substances in your body. It works by slowing down the release of these substances from the cells. This slows tumor growth. It also decreases the symptoms of carcinoid syndrome, such as flushing or diarrhea. This medicine may be used for other purposes; ask your health care provider or pharmacist if you have questions. COMMON BRAND NAME(S): Somatuline Depot What should I tell my care team before I take this medication? They need to know if you have any of these conditions: Diabetes Gallbladder disease Heart disease Kidney disease Liver disease Thyroid disease An unusual or allergic reaction to lanreotide, other medications, foods, dyes, or preservatives Pregnant or trying to get pregnant Breast-feeding How should I use this medication? This medication is injected under the skin. It is given by your care team in a hospital or clinic setting. Talk to your care team about the use of this medication in children. Special care may be needed. Overdosage: If you think you have taken too much of this medicine contact a poison control center or emergency room at once. NOTE: This medicine is only for you. Do not share this medicine with others. What if I miss a dose? Keep appointments for follow-up doses. It is important not to miss your dose. Call your care team if you are unable to keep an appointment. What may interact with this medication? Bromocriptine Cyclosporine Certain medications for blood  pressure, heart disease, irregular heartbeat Certain medications for diabetes Quinidine Terfenadine This list may not describe all possible interactions. Give your health care provider a list of all the medicines, herbs, non-prescription drugs, or dietary supplements you use. Also tell them if you smoke, drink alcohol, or use illegal drugs. Some items may interact with your medicine. What should I watch for while using this medication? Visit your care team for regular checks on your progress. Tell your care team if your symptoms do not start to get better or if they get worse. Your condition will be monitored carefully while you are receiving this medication. You may need blood work while you are taking this medication. This medication may increase blood sugar. The risk may be higher in patients who already have diabetes. Ask your care team what you can do to lower your risk of diabetes while taking this medication. Talk to your care team if you wish to become pregnant or think you may be pregnant. This medication can cause serious birth defects. Do not breast-feed while taking this medication and for 6 months after stopping therapy. This medication may cause infertility. Talk to your care team if you are concerned about your fertility. What side effects may I notice from receiving this medication? Side effects that you should report to your care team as soon as possible: Allergic reactions--skin rash, itching, hives, swelling of the face, lips, tongue, or throat Gallbladder problems--severe stomach pain, nausea, vomiting, fever High blood sugar (hyperglycemia)--increased thirst or amount of urine, unusual weakness or fatigue, blurry vision Increase in blood pressure Low blood sugar (hypoglycemia)--tremors or shaking, anxiety, sweating, cold   or clammy skin, confusion, dizziness, rapid heartbeat Low thyroid levels (hypothyroidism)--unusual weakness or fatigue, increased sensitivity to cold,  constipation, hair loss, dry skin, weight gain, feelings of depression Slow heartbeat--dizziness, feeling faint or lightheaded, confusion, trouble breathing, unusual weakness or fatigue Side effects that usually do not require medical attention (report to your care team if they continue or are bothersome): Diarrhea Dizziness Headache Muscle spasms Nausea Pain, redness, irritation, or bruising at the injection site Stomach pain This list may not describe all possible side effects. Call your doctor for medical advice about side effects. You may report side effects to FDA at 1-800-FDA-1088. Where should I keep my medication? This medication is given in a hospital or clinic. It will not be stored at home. NOTE: This sheet is a summary. It may not cover all possible information. If you have questions about this medicine, talk to your doctor, pharmacist, or health care provider.  2023 Elsevier/Gold Standard (2021-08-20 00:00:00)  

## 2022-10-18 LAB — CHROMOGRANIN A: Chromogranin A (ng/mL): 22.8 ng/mL (ref 0.0–101.8)

## 2022-10-20 ENCOUNTER — Encounter: Payer: Self-pay | Admitting: *Deleted

## 2022-10-31 ENCOUNTER — Other Ambulatory Visit (HOSPITAL_BASED_OUTPATIENT_CLINIC_OR_DEPARTMENT_OTHER): Payer: Self-pay | Admitting: Neurological Surgery

## 2022-10-31 DIAGNOSIS — D329 Benign neoplasm of meninges, unspecified: Secondary | ICD-10-CM

## 2022-11-04 ENCOUNTER — Telehealth: Payer: Self-pay

## 2022-11-04 ENCOUNTER — Ambulatory Visit (HOSPITAL_BASED_OUTPATIENT_CLINIC_OR_DEPARTMENT_OTHER)
Admission: RE | Admit: 2022-11-04 | Discharge: 2022-11-04 | Disposition: A | Discharge status: Home / Self Care | Payer: 59 | Source: Ambulatory Visit | Attending: Neurological Surgery | Admitting: Neurological Surgery

## 2022-11-04 DIAGNOSIS — D329 Benign neoplasm of meninges, unspecified: Secondary | ICD-10-CM | POA: Diagnosis present

## 2022-11-04 MED ORDER — GADOBUTROL 1 MMOL/ML IV SOLN
8.0000 mL | Freq: Once | INTRAVENOUS | Status: AC | PRN
  Administered 2022-11-04: 8 mL via INTRAVENOUS
  Filled 2022-11-04: qty 8

## 2022-11-04 NOTE — Telephone Encounter (Signed)
Notified Patient by voicemail of completion of FMLA forms. Fax transmission confirmation received. Copy of forms mailed to Patient as requested.

## 2022-11-11 ENCOUNTER — Other Ambulatory Visit: Payer: Self-pay

## 2022-11-11 ENCOUNTER — Inpatient Hospital Stay: Payer: 59

## 2022-11-11 ENCOUNTER — Inpatient Hospital Stay: Payer: 59 | Attending: Hematology and Oncology

## 2022-11-11 VITALS — BP 144/100 | HR 74 | Temp 98.3°F | Resp 18

## 2022-11-11 DIAGNOSIS — C7B8 Other secondary neuroendocrine tumors: Secondary | ICD-10-CM

## 2022-11-11 DIAGNOSIS — C7B03 Secondary carcinoid tumors of bone: Secondary | ICD-10-CM | POA: Insufficient documentation

## 2022-11-11 DIAGNOSIS — C7A025 Malignant carcinoid tumor of the sigmoid colon: Secondary | ICD-10-CM | POA: Diagnosis present

## 2022-11-11 DIAGNOSIS — Z79899 Other long term (current) drug therapy: Secondary | ICD-10-CM | POA: Diagnosis not present

## 2022-11-11 LAB — CBC WITH DIFFERENTIAL (CANCER CENTER ONLY)
Abs Immature Granulocytes: 0.02 10*3/uL (ref 0.00–0.07)
Basophils Absolute: 0 10*3/uL (ref 0.0–0.1)
Basophils Relative: 0 %
Eosinophils Absolute: 0.2 10*3/uL (ref 0.0–0.5)
Eosinophils Relative: 2 %
HCT: 36 % (ref 36.0–46.0)
Hemoglobin: 11.9 g/dL — ABNORMAL LOW (ref 12.0–15.0)
Immature Granulocytes: 0 %
Lymphocytes Relative: 37 %
Lymphs Abs: 2.7 10*3/uL (ref 0.7–4.0)
MCH: 27.9 pg (ref 26.0–34.0)
MCHC: 33.1 g/dL (ref 30.0–36.0)
MCV: 84.3 fL (ref 80.0–100.0)
Monocytes Absolute: 0.7 10*3/uL (ref 0.1–1.0)
Monocytes Relative: 10 %
Neutro Abs: 3.6 10*3/uL (ref 1.7–7.7)
Neutrophils Relative %: 51 %
Platelet Count: 246 10*3/uL (ref 150–400)
RBC: 4.27 MIL/uL (ref 3.87–5.11)
RDW: 13.9 % (ref 11.5–15.5)
WBC Count: 7.2 10*3/uL (ref 4.0–10.5)
nRBC: 0 % (ref 0.0–0.2)

## 2022-11-11 LAB — CMP (CANCER CENTER ONLY)
ALT: 15 U/L (ref 0–44)
AST: 15 U/L (ref 15–41)
Albumin: 4.1 g/dL (ref 3.5–5.0)
Alkaline Phosphatase: 62 U/L (ref 38–126)
Anion gap: 6 (ref 5–15)
BUN: 15 mg/dL (ref 6–20)
CO2: 31 mmol/L (ref 22–32)
Calcium: 9.6 mg/dL (ref 8.9–10.3)
Chloride: 103 mmol/L (ref 98–111)
Creatinine: 0.83 mg/dL (ref 0.44–1.00)
GFR, Estimated: >60 mL/min (ref >60)
Glucose, Bld: 94 mg/dL (ref 70–99)
Potassium: 3.7 mmol/L (ref 3.5–5.1)
Sodium: 140 mmol/L (ref 135–145)
Total Bilirubin: 0.3 mg/dL (ref 0.3–1.2)
Total Protein: 7.3 g/dL (ref 6.5–8.1)

## 2022-11-11 MED ORDER — LANREOTIDE ACETATE 120 MG/0.5ML ~~LOC~~ SOLN
120.0000 mg | Freq: Once | SUBCUTANEOUS | Status: AC
  Administered 2022-11-11: 120 mg via SUBCUTANEOUS
  Filled 2022-11-11: qty 120

## 2022-11-15 LAB — CHROMOGRANIN A: Chromogranin A (ng/mL): 22.7 ng/mL (ref 0.0–101.8)

## 2022-11-29 ENCOUNTER — Telehealth: Payer: Self-pay | Admitting: Hematology and Oncology

## 2022-12-09 ENCOUNTER — Inpatient Hospital Stay: Payer: 59

## 2022-12-09 ENCOUNTER — Other Ambulatory Visit: Payer: Self-pay

## 2022-12-09 ENCOUNTER — Inpatient Hospital Stay: Payer: 59 | Attending: Hematology and Oncology

## 2022-12-09 VITALS — BP 158/104 | HR 81 | Temp 98.6°F | Resp 16

## 2022-12-09 DIAGNOSIS — Z79899 Other long term (current) drug therapy: Secondary | ICD-10-CM | POA: Diagnosis not present

## 2022-12-09 DIAGNOSIS — C7B8 Other secondary neuroendocrine tumors: Secondary | ICD-10-CM

## 2022-12-09 DIAGNOSIS — C7B03 Secondary carcinoid tumors of bone: Secondary | ICD-10-CM | POA: Diagnosis present

## 2022-12-09 DIAGNOSIS — C7A025 Malignant carcinoid tumor of the sigmoid colon: Secondary | ICD-10-CM | POA: Diagnosis present

## 2022-12-09 LAB — CMP (CANCER CENTER ONLY)
ALT: 13 U/L (ref 0–44)
AST: 14 U/L — ABNORMAL LOW (ref 15–41)
Albumin: 4.3 g/dL (ref 3.5–5.0)
Alkaline Phosphatase: 64 U/L (ref 38–126)
Anion gap: 6 (ref 5–15)
BUN: 18 mg/dL (ref 6–20)
CO2: 30 mmol/L (ref 22–32)
Calcium: 9.9 mg/dL (ref 8.9–10.3)
Chloride: 103 mmol/L (ref 98–111)
Creatinine: 0.84 mg/dL (ref 0.44–1.00)
GFR, Estimated: >60 mL/min (ref >60)
Glucose, Bld: 112 mg/dL — ABNORMAL HIGH (ref 70–99)
Potassium: 3.6 mmol/L (ref 3.5–5.1)
Sodium: 139 mmol/L (ref 135–145)
Total Bilirubin: 0.3 mg/dL (ref 0.3–1.2)
Total Protein: 7.6 g/dL (ref 6.5–8.1)

## 2022-12-09 LAB — CBC WITH DIFFERENTIAL (CANCER CENTER ONLY)
Abs Immature Granulocytes: 0.02 10*3/uL (ref 0.00–0.07)
Basophils Absolute: 0 10*3/uL (ref 0.0–0.1)
Basophils Relative: 0 %
Eosinophils Absolute: 0.2 10*3/uL (ref 0.0–0.5)
Eosinophils Relative: 2 %
HCT: 36.8 % (ref 36.0–46.0)
Hemoglobin: 12.1 g/dL (ref 12.0–15.0)
Immature Granulocytes: 0 %
Lymphocytes Relative: 34 %
Lymphs Abs: 2.4 10*3/uL (ref 0.7–4.0)
MCH: 27.9 pg (ref 26.0–34.0)
MCHC: 32.9 g/dL (ref 30.0–36.0)
MCV: 84.8 fL (ref 80.0–100.0)
Monocytes Absolute: 0.7 10*3/uL (ref 0.1–1.0)
Monocytes Relative: 9 %
Neutro Abs: 4 10*3/uL (ref 1.7–7.7)
Neutrophils Relative %: 55 %
Platelet Count: 223 10*3/uL (ref 150–400)
RBC: 4.34 MIL/uL (ref 3.87–5.11)
RDW: 13.5 % (ref 11.5–15.5)
WBC Count: 7.3 10*3/uL (ref 4.0–10.5)
nRBC: 0 % (ref 0.0–0.2)

## 2022-12-09 MED ORDER — LANREOTIDE ACETATE 120 MG/0.5ML ~~LOC~~ SOLN
120.0000 mg | Freq: Once | SUBCUTANEOUS | Status: AC
  Administered 2022-12-09: 120 mg via SUBCUTANEOUS
  Filled 2022-12-09: qty 120

## 2022-12-13 LAB — CHROMOGRANIN A: Chromogranin A (ng/mL): 24.3 ng/mL (ref 0.0–101.8)

## 2023-01-06 ENCOUNTER — Ambulatory Visit: Payer: 59

## 2023-01-06 ENCOUNTER — Ambulatory Visit: Payer: 59 | Admitting: Hematology and Oncology

## 2023-01-06 ENCOUNTER — Other Ambulatory Visit: Payer: 59

## 2023-01-06 ENCOUNTER — Ambulatory Visit: Payer: 59 | Admitting: Physician Assistant

## 2023-01-07 ENCOUNTER — Other Ambulatory Visit: Payer: Self-pay | Admitting: Internal Medicine

## 2023-01-09 NOTE — Progress Notes (Unsigned)
[facility] [phone]   [phone]  PROGRESS NOTE  Patient Care Team: Corwin Levins, MD as PCP - General  Hematological/Oncological History # Metastatic Neuroendocrine Tumor, Metastasis to the Spine 1) 08/26/2019: CT Abdomen Pelvis performed due to RUQ abdominal pain. Imaging showed a soft tissue mass abutting the right posterior eighth rib 2) 09/06/2019: CT chest performed which showed Ill-defined soft tissue nodule along the medial right posterior thoracic wall at the 8th intercostal space to the right of the spine. MRI was recommended. 3) 10/19/2019: MRI Thoracic spine showed enhancing lesions throughout the visualized spine consistent with metastatic disease. Additionally there was a 1.5 cm soft tissue nodule in the posteromedial right eighth intercostal space, more concerning for a metastasis 4) 10/25/2019: establish care with Dr. Leonides Schanz   5) 11/05/2019: attempted biopsy of soft tissue mass at right posterior 8th rib, but lesion had dissipated at time of biopsy attempt 6) 11/27/2019: PET CT scan performed showing right inguinal lymph nodes and osseous lesions, indicative of metastatic disease of unknown primary. 7) 12/11/2019: CT bone marrow biopsy showed metastatic neuroendocrine neoplasm 8) 12/27/2019: start of lanreotide 120mg  sub q28 days  9) 10/15/2020: NM PET (CU-64) scan showed widespread osseous and lymphatic uptake consistent with metastatic spread of neuroendocrine tumor.  This is more avid than prior. 01/20/2021: clinic visit with Dr. Nolen Mu due to concern for possible mantle cell lymphoma seen on pathology review of previous biopsy 04/15/2021: Dose 1 (of 4) of lutathera treatment with nuclear medicine.  06/10/2021: Dose 2 (of 4) of lutathera treatment with nuclear medicine.  08/05/2021: Dose 3 (of 4) of lutathera treatment with nuclear medicine.  09/03/2021:Dose 4 (of 4) of lutathera treatment with nuclear medicine.  11/26/2021: NM Copper Dotatate Scan  shows dramatic reduction in number and activity of skeletal metastasis.   #Low Grade Neuroendocrine Tumor 1) 2006: reportedly had rectal carcinoid tumor removed 2) 08/14/2008: patient had a flexibile sigmoidoscopy with endoscopic ultrasound which resected an 8mm, subepithelial lesion in rectum. Findings consistent with low grade neuroendocrine tumor. 3) 04/08/2010: repeat colonoscopy, no residual tumor. Repeat recommended in 2016.  4) 12/11/2019: metastatic recurrence found on biopsy, noted above. 5) 11/26/2021:  NM PET CU-64 showed a dramatic reduction in the number and activity as skeletal metastases.  #Mantle Cell Lymphoma 11/24/2020: incidental findings of low level mantle cell lymphoma on bone marrow biopsy. Noted on outside review of pathology at [facility].  01/20/2021: clinic visit with Dr. Nolen Mu due to concern for possible mantle cell lymphoma seen on pathology review of previous biopsy  Interval History:  Michelle Solomon 62 y.o. female with medical history significant for metastatic neuroendocrine tumor of the colon who presents for a follow up visit. The patient's last visit was on 10/14/2022. In the interim since the last visit she ***  On exam today Michelle Solomon reports ***  She denies any fevers, chills, shortness of breath, chest pain, palpitations or cough. She has no other complaints.   A full 10 point ROS is listed below.  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   Cancer [facility])    Neuro endocrine tumor   CONJUNCTIVITIS, ALLERGIC 11/27/2008   HEMORRHOIDS 06/16/2008   HTN (hypertension)  Hx of adenomatous colonic polyps 11/2019   NIPPLE DISCHARGE 01/21/2010   Other specified forms of hearing loss 10/08/2009    Overweight(278.02) 05/29/2007   RASH-NONVESICULAR 05/29/2007   Wheezing 06/22/2010    SURGICAL HISTORY: Past Surgical History:  Procedure Laterality Date   COLONOSCOPY      SOCIAL HISTORY: Social History   Socioeconomic History   Marital status: Widowed    Spouse name: Not on file   Number of children: 3   Years of education: Not on file   Highest education level: Not on file  Occupational History   Occupation: CARD REPLACEMEN    Employer: [employer]   Occupation: customer service  Tobacco Use   Smoking status: Some Days    Packs/day: 0.50    Years: 40.00    Additional pack years: 0.00    Total pack years: 20.00    Types: Cigarettes    Last attempt to quit: 10/22/2019    Years since quitting: 3.2   Smokeless tobacco: Never   Tobacco comments:    1 pack weekly.  started smoking at 62 years old  Vaping Use   Vaping Use: Never used  Substance and Sexual Activity   Alcohol use: Yes    Comment: ocassional   Drug use: No   Sexual activity: Not Currently  Other Topics Concern   Not on file  Social History Narrative   Daily caffeine use 3   Patient does not get regular exercise   Social Determinants of Health   Financial Resource Strain: Not on file  Food Insecurity: Not on file  Transportation Needs: Not on file  Physical Activity: Not on file  Stress: Not on file  Social Connections: Not on file  Intimate Partner Violence: Not on file    FAMILY HISTORY: Family History  Problem Relation Age of Onset   Allergies Sister    Diabetes Sister    Hypertension Sister    Hypertension Mother    Prostate cancer Father    Hypertension Brother    Hypertension Sister    Diabetes Paternal Aunt    Cancer Paternal Aunt        type unknown   Prostate cancer Brother    Stroke Maternal Aunt    Hypertension Maternal Aunt    Colon cancer Neg Hx    Esophageal cancer Neg Hx    Rectal cancer Neg Hx    Stomach cancer Neg Hx     ALLERGIES:  is allergic to  shellfish allergy.  MEDICATIONS:  Current Outpatient Medications  Medication Sig Dispense Refill   buPROPion (WELLBUTRIN XL) 300 MG 24 hr tablet TAKE 1 TABLET(300 MG) BY MOUTH DAILY 90 tablet 3   cholecalciferol (VITAMIN D3) 25 MCG (1000 UNIT) tablet Take 1,000 Units by mouth daily.     hydrochlorothiazide (HYDRODIURIL) 12.5 MG tablet TAKE 1 TABLET(12.5 MG) BY MOUTH DAILY 30 tablet 5   hydrocortisone 2.5 % cream Apply topically 2 (two) times daily. 30 g 1   iron polysaccharides (NU-IRON) 150 MG capsule Take 1 capsule (150 mg total) by mouth daily. (Patient not taking: Reported on 10/14/2022) 90 capsule 1   lanreotide acetate (SOMATULINE DEPOT) 120 MG/0.5ML injection Inject 120 mg into the skin every 28 (twenty-eight) days. 0.5 mL 12   loratadine (CLARITIN) 10 MG tablet Take 10 mg by mouth daily as needed for allergies.     losartan (COZAAR) 50 MG tablet Take 1 tablet (50 mg total) by mouth daily. 90 tablet 3   ondansetron (ZOFRAN) 8  MG tablet Take 1 tablet (8 mg total) by mouth 2 (two) times daily as needed for nausea or vomiting. 20 tablet 0   oxyCODONE-acetaminophen (PERCOCET/ROXICET) 5-325 MG tablet Take 1 tablet by mouth every 6 (six) hours as needed for severe pain. 30 tablet 0   Turmeric 500 MG CAPS Take 500 mg by mouth daily.     vitamin B-12 (CYANOCOBALAMIN) 100 MCG tablet Take 100 mcg by mouth daily. (Patient not taking: Reported on 10/14/2022)     VITAMIN D-VITAMIN K PO Take 1 tablet by mouth daily.     zolpidem (AMBIEN) 10 MG tablet Take 1 tablet (10 mg total) by mouth at bedtime as needed for sleep. 90 tablet 1   No current facility-administered medications for this visit.   Facility-Administered Medications Ordered in Other Visits  Medication Dose Route Frequency Provider Last Rate Last Admin   octreotide (SANDOSTATIN LAR) 30 MG IM injection             REVIEW OF SYSTEMS:   Constitutional: ( - ) fevers, ( - )  chills , ( - ) night sweats Eyes: ( - ) blurriness of vision, ( - )  double vision, ( - ) watery eyes Ears, nose, mouth, throat, and face: ( - ) mucositis, ( - ) sore throat Respiratory: ( - ) cough, ( - ) dyspnea, ( - ) wheezes Cardiovascular: ( - ) palpitation, ( - ) chest discomfort, ( - ) lower extremity swelling Gastrointestinal:  ( - ) nausea, ( - ) heartburn, ( - ) change in bowel habits Skin: ( - ) abnormal skin rashes Lymphatics: ( - ) new lymphadenopathy, ( - ) easy bruising Neurological: ( - ) numbness, ( - ) tingling, ( - ) new weaknesses Behavioral/Psych: ( - ) mood change, ( - ) new changes  All other systems were reviewed with the patient and are negative.  PHYSICAL EXAMINATION: ECOG PERFORMANCE STATUS: 0 - Asymptomatic  There were no vitals filed for this visit.    There were no vitals filed for this visit.  GENERAL: well appearing middle aged African American female in NAD  SKIN: skin color, texture, turgor are normal, no rashes or significant lesions EYES: conjunctiva are pink and non-injected, sclera clear LUNGS: clear to auscultation and percussion with normal breathing effort HEART: regular rate & rhythm and no murmurs and no lower extremity edema Musculoskeletal: no cyanosis of digits and no clubbing  PSYCH: alert & oriented x 3, fluent speech NEURO: no focal motor/sensory deficits  LABORATORY DATA:  I have reviewed the data as listed    Latest Ref Rng & Units 12/09/2022    8:12 AM 11/11/2022    8:19 AM 10/14/2022    8:19 AM  CBC  WBC 4.0 - 10.5 K/uL 7.3  7.2  6.5   Hemoglobin 12.0 - 15.0 g/dL 16.1  09.6  04.5   Hematocrit 36.0 - 46.0 % 36.8  36.0  37.9   Platelets 150 - 400 K/uL 223  246  253        Latest Ref Rng & Units 12/09/2022    8:12 AM 11/11/2022    8:19 AM 10/14/2022    8:19 AM  CMP  Glucose 70 - 99 mg/dL 409  94  811   BUN 6 - 20 mg/dL 18  15  13    Creatinine 0.44 - 1.00 mg/dL 9.14  7.82  9.56   Sodium 135 - 145 mmol/L 139  140  140   Potassium 3.5 -  5.1 mmol/L 3.6  3.7  3.7   Chloride 98 - 111 mmol/L 103   103  104   CO2 22 - 32 mmol/L 30  31  29    Calcium 8.9 - 10.3 mg/dL 9.9  9.6  9.8   Total Protein 6.5 - 8.1 g/dL 7.6  7.3  7.8   Total Bilirubin 0.3 - 1.2 mg/dL 0.3  0.3  0.6   Alkaline Phos 38 - 126 U/L 64  62  59   AST 15 - 41 U/L 14  15  16    ALT 0 - 44 U/L 13  15  23      RADIOGRAPHIC STUDIES: I have personally reviewed the radiological images as listed and agreed with the findings in the report.  No results found.   ASSESSMENT & PLAN Michelle Solomon 62 y.o. female with medical history significant for metastatic neuroendocrine tumor of the colon who presents for a follow up visit.    # Metastatic Neuroendocrine Tumor, Metastasis to the Spine  --previously discussed with patient that the options would include observation with serial imaging or starting lanreotide therapy. The patient opted to start treatment --will plan for continued lanreotide 120mg  subq q28 days. Started therapy on 12/27/2019. This will be continued until progression or intolerance to therapy. Next dose due on 02/19/2021 --Due to the extensive involvement of her skeleton and increased activity on her last nuclear medicine scan it was recommended that she be considered for Lu 177 dotatate treatment. Initiated treatment on 04/15/2021 with plans to complete a total of 4 treatments.  --CU-64 scan q 3 months. Last in April 2022. Completed PET CT scan at [facility] on 02/02/2021, no evidence of lymphoma.  --Patient expressed interest in a second opinion.  She saw Dr. Lattie Corns at [facility].  He provided excellent advice regarding options moving forward.  After hearing all the different options the patient noted she would like to continue her current lanreotide shots with consideration of increased frequency if she were to be found to be progressing --patient underwent treatment with Lutathera therapy. She is s/p treatment 4 of 4. They administered 30 mg IM long acting sandostatin on days where she undergoes  Lutathera treatment (q 8 weeks x 4 doses). Completed the 4th Lutathera treatment on 09/30/21  Plan:  --continue lanreotide 120 mg q 28 days with q 3 month zometa --Last Zometa infusion completed on 06/10/2022. Did not receive today, recommend at next infusion 11/11/2022. *** --Labs today show white blood cell count ***. Creatinine and LFTs normal. Chromogranin levels pending today. Last chromogranin level from 09/16/2022 was normal at 22.4. -- Will wait to see if [facility] will obtain repeat imaging since she will see neuroendocrine specialist on 10/30/2022 as well.  Otherwise we will obtain repeat imaging in May 2024.  --RTC in 12 weeks with interval continued monthly lanreotide injections.   #Mantle Cell Lymphoma -- Incidental finding from prior biopsy results.  This was detected on secondary pathological review at [facility] when the patient went for second opinion --Unclear if this represents a small low-grade clinically insignificant population of mantle cell lymphoma or a more concerning issue --Patient following with Dr. Nolen Mu at [facility] --PET CT scan on 02/02/2021 showed no evidence of lymphoma.  --We will continue to monitor and appreciate the guidance of Dr. Joanell Rising meningioma: --Seen on MRI brain  from 08/09/2022 at [facility], measuring 3.6 cm along the right frontal convexity with broad-based dural attachment.  --Evaluated by Dr. Greig Castilla  Culter from [facility] who recommended surgical resection. --Patient is scheduled for a second opinion at [facility] on 10/30/2022  No orders of the defined types were placed in this encounter.  All questions were answered. The patient knows to call the clinic with any problems, questions or concerns.  I have spent a total of 30 minutes minutes of face-to-face and non-face-to-face time, preparing to see the patient,performing a medically appropriate examination, counseling and educating the patient, documenting clinical  information in the electronic health record, and care coordination.   Ulysees Barns, MD Department of Hematology/Oncology [facility] at [facility] Phone: [phone] Pager: [phone] Email: [email or web address]    01/09/2023 7:41 AM

## 2023-01-10 ENCOUNTER — Inpatient Hospital Stay: Payer: 59

## 2023-01-10 ENCOUNTER — Inpatient Hospital Stay: Payer: 59 | Attending: Hematology and Oncology

## 2023-01-10 ENCOUNTER — Inpatient Hospital Stay: Payer: 59 | Admitting: Hematology and Oncology

## 2023-01-10 ENCOUNTER — Other Ambulatory Visit: Payer: Self-pay

## 2023-01-10 VITALS — BP 148/100 | HR 88 | Temp 98.2°F | Resp 15 | Wt 183.6 lb

## 2023-01-10 DIAGNOSIS — C7A8 Other malignant neuroendocrine tumors: Secondary | ICD-10-CM

## 2023-01-10 DIAGNOSIS — C7B8 Other secondary neuroendocrine tumors: Secondary | ICD-10-CM

## 2023-01-10 DIAGNOSIS — C7A025 Malignant carcinoid tumor of the sigmoid colon: Secondary | ICD-10-CM | POA: Diagnosis not present

## 2023-01-10 DIAGNOSIS — Z79899 Other long term (current) drug therapy: Secondary | ICD-10-CM | POA: Diagnosis not present

## 2023-01-10 DIAGNOSIS — C7B03 Secondary carcinoid tumors of bone: Secondary | ICD-10-CM | POA: Insufficient documentation

## 2023-01-10 DIAGNOSIS — M899 Disorder of bone, unspecified: Secondary | ICD-10-CM | POA: Diagnosis not present

## 2023-01-10 LAB — CBC WITH DIFFERENTIAL (CANCER CENTER ONLY)
Abs Immature Granulocytes: 0.02 10*3/uL (ref 0.00–0.07)
Basophils Absolute: 0 10*3/uL (ref 0.0–0.1)
Basophils Relative: 0 %
Eosinophils Absolute: 0.2 10*3/uL (ref 0.0–0.5)
Eosinophils Relative: 2 %
HCT: 37.7 % (ref 36.0–46.0)
Hemoglobin: 12.2 g/dL (ref 12.0–15.0)
Immature Granulocytes: 0 %
Lymphocytes Relative: 35 %
Lymphs Abs: 2.2 10*3/uL (ref 0.7–4.0)
MCH: 28 pg (ref 26.0–34.0)
MCHC: 32.4 g/dL (ref 30.0–36.0)
MCV: 86.5 fL (ref 80.0–100.0)
Monocytes Absolute: 0.6 10*3/uL (ref 0.1–1.0)
Monocytes Relative: 10 %
Neutro Abs: 3.3 10*3/uL (ref 1.7–7.7)
Neutrophils Relative %: 53 %
Platelet Count: 233 10*3/uL (ref 150–400)
RBC: 4.36 MIL/uL (ref 3.87–5.11)
RDW: 14.5 % (ref 11.5–15.5)
WBC Count: 6.3 10*3/uL (ref 4.0–10.5)
nRBC: 0 % (ref 0.0–0.2)

## 2023-01-10 LAB — CMP (CANCER CENTER ONLY)
ALT: 22 U/L (ref 0–44)
AST: 22 U/L (ref 15–41)
Albumin: 3.8 g/dL (ref 3.5–5.0)
Alkaline Phosphatase: 63 U/L (ref 38–126)
Anion gap: 6 (ref 5–15)
BUN: 12 mg/dL (ref 6–20)
CO2: 31 mmol/L (ref 22–32)
Calcium: 9.4 mg/dL (ref 8.9–10.3)
Chloride: 104 mmol/L (ref 98–111)
Creatinine: 0.75 mg/dL (ref 0.44–1.00)
GFR, Estimated: >60 mL/min (ref >60)
Glucose, Bld: 130 mg/dL — ABNORMAL HIGH (ref 70–99)
Potassium: 4.2 mmol/L (ref 3.5–5.1)
Sodium: 141 mmol/L (ref 135–145)
Total Bilirubin: 0.4 mg/dL (ref 0.3–1.2)
Total Protein: 7.2 g/dL (ref 6.5–8.1)

## 2023-01-10 MED ORDER — LANREOTIDE ACETATE 120 MG/0.5ML ~~LOC~~ SOLN
120.0000 mg | Freq: Once | SUBCUTANEOUS | Status: AC
  Administered 2023-01-10: 120 mg via SUBCUTANEOUS
  Filled 2023-01-10: qty 120

## 2023-01-10 MED ORDER — ZOLEDRONIC ACID 4 MG/100ML IV SOLN
4.0000 mg | INTRAVENOUS | Status: DC
  Administered 2023-01-10: 4 mg via INTRAVENOUS
  Filled 2023-01-10: qty 100

## 2023-01-10 NOTE — Patient Instructions (Signed)
[place] [facility]  Discharge Instructions: Thank you for choosing [facility] to provide your oncology and hematology care.   If you have a lab appointment with the Cancer Center, please go directly to the Cancer Center and check in at the registration area.   Wear comfortable clothing and clothing appropriate for easy access to any Portacath or PICC line.   We strive to give you quality time with your provider. You may need to reschedule your appointment if you arrive late (15 or more minutes).  Arriving late affects you and other patients whose appointments are after yours.  Also, if you miss three or more appointments without notifying the office, you may be dismissed from the clinic at the provider's discretion.      For prescription refill requests, have your pharmacy contact our office and allow 72 hours for refills to be completed.    Today you received the following chemotherapy and/or immunotherapy agent: Zometa  , Lanreotide   To help prevent nausea and vomiting after your treatment, we encourage you to take your nausea medication as directed.  BELOW ARE SYMPTOMS THAT SHOULD BE REPORTED IMMEDIATELY: *FEVER GREATER THAN 100.4 F (38 C) OR HIGHER *CHILLS OR SWEATING *NAUSEA AND VOMITING THAT IS NOT CONTROLLED WITH YOUR NAUSEA MEDICATION *UNUSUAL SHORTNESS OF BREATH *UNUSUAL BRUISING OR BLEEDING *URINARY PROBLEMS (pain or burning when urinating, or frequent urination) *BOWEL PROBLEMS (unusual diarrhea, constipation, pain near the anus) TENDERNESS IN MOUTH AND THROAT WITH OR WITHOUT PRESENCE OF ULCERS (sore throat, sores in mouth, or a toothache) UNUSUAL RASH, SWELLING OR PAIN  UNUSUAL VAGINAL DISCHARGE OR ITCHING   Items with * indicate a potential emergency and should be followed up as soon as possible or go to the Emergency Department if any problems should occur.  Please show the CHEMOTHERAPY ALERT CARD or IMMUNOTHERAPY ALERT CARD  at check-in to the Emergency Department and triage nurse.  Should you have questions after your visit or need to cancel or reschedule your appointment, please contact [place] [facility]  Dept: [phone]  and follow the prompts.  Office hours are 8:00 a.m. to 4:30 p.m. Monday - Friday. Please note that voicemails left after 4:00 p.m. may not be returned until the following business day.  We are closed weekends and major holidays. You have access to a nurse at all times for urgent questions. Please call the main number to the clinic Dept: [phone] and follow the prompts.   For any non-urgent questions, you may also contact your provider using MyChart. We now offer e-Visits for anyone 43 and older to request care online for non-urgent symptoms. For details visit [email or web address].   Also download the MyChart app! Go to the app store, search "MyChart", open the app, select [place], and log in with your MyChart username and password.  Masks are optional in the cancer centers. If you would like for your care team to wear a mask while they are taking care of you, please let them know. For doctor visits, patients may have with them one support person who is at least 61 years old. At this time, visitors are not allowed in the infusion area.

## 2023-01-10 NOTE — Progress Notes (Signed)
Patient seen by Dr. Cruzita Lederer are not all within treatment parameters. Elevated BP of 148/100.   Labs reviewed: and are within treatment parameters.  Per physician team, patient is ready for treatment and there are NO modifications to the treatment plan. Okay to proceed with elevated BP.

## 2023-01-11 ENCOUNTER — Telehealth: Payer: Self-pay | Admitting: Hematology and Oncology

## 2023-01-11 LAB — CHROMOGRANIN A: Chromogranin A (ng/mL): 22.8 ng/mL (ref 0.0–101.8)

## 2023-02-06 ENCOUNTER — Encounter (HOSPITAL_COMMUNITY): Admission: RE | Admit: 2023-02-06 | Payer: 59 | Source: Ambulatory Visit

## 2023-02-06 ENCOUNTER — Telehealth: Payer: Self-pay | Admitting: Hematology and Oncology

## 2023-02-06 ENCOUNTER — Telehealth: Payer: Self-pay | Admitting: *Deleted

## 2023-02-06 NOTE — Telephone Encounter (Signed)
Received call from pt requesting to have her labs and lanreotide injection rescheduled to after her PET scan. Her PET scan is scheduled for 02/08/23. Scheduling message sent to have labs and injection done on either 8/8 or 02/10/2023

## 2023-02-07 ENCOUNTER — Inpatient Hospital Stay: Payer: 59

## 2023-02-08 ENCOUNTER — Encounter (HOSPITAL_COMMUNITY)
Admission: RE | Admit: 2023-02-08 | Discharge: 2023-02-08 | Disposition: A | Discharge status: Home / Self Care | Payer: 59 | Source: Ambulatory Visit | Attending: Hematology and Oncology | Admitting: Hematology and Oncology

## 2023-02-08 DIAGNOSIS — C7B8 Other secondary neuroendocrine tumors: Secondary | ICD-10-CM | POA: Insufficient documentation

## 2023-02-08 DIAGNOSIS — C7A8 Other malignant neuroendocrine tumors: Secondary | ICD-10-CM | POA: Insufficient documentation

## 2023-02-08 MED ORDER — COPPER CU 64 DOTATATE 1 MCI/ML IV SOLN
4.0000 | Freq: Once | INTRAVENOUS | Status: AC
  Administered 2023-02-08: 4 via INTRAVENOUS

## 2023-02-09 ENCOUNTER — Telehealth: Payer: Self-pay | Admitting: *Deleted

## 2023-02-09 NOTE — Telephone Encounter (Signed)
TCT patient regarding recent PET scan results. No answer. Left vm message for pt to return this call at her convenience to [phone]

## 2023-02-09 NOTE — Telephone Encounter (Signed)
-----   Message from Ulysees Barns IV sent at 02/09/2023  8:31 AM EDT ----- Please let Michelle Solomon know that her PET dotatate scan was stable overall. There were no changes in the bone lesions. There was one new small spot in the lungs which we will continue to monitor. We will plan to see her back as scheduled in Oct 2024. ----- Message ----- From: Leory Plowman, Rad Results In Sent: 02/08/2023   3:37 PM EDT To: Jaci Standard, MD

## 2023-02-10 ENCOUNTER — Other Ambulatory Visit: Payer: Self-pay

## 2023-02-10 ENCOUNTER — Inpatient Hospital Stay: Payer: 59

## 2023-02-10 ENCOUNTER — Inpatient Hospital Stay: Payer: 59 | Attending: Hematology and Oncology

## 2023-02-10 VITALS — BP 149/97 | HR 83 | Temp 98.2°F | Resp 16

## 2023-02-10 DIAGNOSIS — C7B8 Other secondary neuroendocrine tumors: Secondary | ICD-10-CM

## 2023-02-10 DIAGNOSIS — Z79899 Other long term (current) drug therapy: Secondary | ICD-10-CM | POA: Diagnosis not present

## 2023-02-10 DIAGNOSIS — C7A025 Malignant carcinoid tumor of the sigmoid colon: Secondary | ICD-10-CM | POA: Insufficient documentation

## 2023-02-10 DIAGNOSIS — C7B03 Secondary carcinoid tumors of bone: Secondary | ICD-10-CM | POA: Insufficient documentation

## 2023-02-10 LAB — CBC WITH DIFFERENTIAL (CANCER CENTER ONLY)
Abs Immature Granulocytes: 0.02 10*3/uL (ref 0.00–0.07)
Basophils Absolute: 0 10*3/uL (ref 0.0–0.1)
Basophils Relative: 1 %
Eosinophils Absolute: 0.1 10*3/uL (ref 0.0–0.5)
Eosinophils Relative: 1 %
HCT: 36.1 % (ref 36.0–46.0)
Hemoglobin: 12.3 g/dL (ref 12.0–15.0)
Immature Granulocytes: 0 %
Lymphocytes Relative: 39 %
Lymphs Abs: 2.8 10*3/uL (ref 0.7–4.0)
MCH: 28.7 pg (ref 26.0–34.0)
MCHC: 34.1 g/dL (ref 30.0–36.0)
MCV: 84.1 fL (ref 80.0–100.0)
Monocytes Absolute: 0.6 10*3/uL (ref 0.1–1.0)
Monocytes Relative: 9 %
Neutro Abs: 3.5 10*3/uL (ref 1.7–7.7)
Neutrophils Relative %: 50 %
Platelet Count: 246 10*3/uL (ref 150–400)
RBC: 4.29 MIL/uL (ref 3.87–5.11)
RDW: 13.9 % (ref 11.5–15.5)
WBC Count: 7.1 10*3/uL (ref 4.0–10.5)
nRBC: 0 % (ref 0.0–0.2)

## 2023-02-10 LAB — CMP (CANCER CENTER ONLY)
ALT: 10 U/L (ref 0–44)
AST: 12 U/L — ABNORMAL LOW (ref 15–41)
Albumin: 4 g/dL (ref 3.5–5.0)
Alkaline Phosphatase: 67 U/L (ref 38–126)
Anion gap: 6 (ref 5–15)
BUN: 14 mg/dL (ref 6–20)
CO2: 29 mmol/L (ref 22–32)
Calcium: 8.8 mg/dL — ABNORMAL LOW (ref 8.9–10.3)
Chloride: 106 mmol/L (ref 98–111)
Creatinine: 0.9 mg/dL (ref 0.44–1.00)
GFR, Estimated: >60 mL/min (ref >60)
Glucose, Bld: 129 mg/dL — ABNORMAL HIGH (ref 70–99)
Potassium: 3.6 mmol/L (ref 3.5–5.1)
Sodium: 141 mmol/L (ref 135–145)
Total Bilirubin: 0.3 mg/dL (ref 0.3–1.2)
Total Protein: 7 g/dL (ref 6.5–8.1)

## 2023-02-10 MED ORDER — LANREOTIDE ACETATE 120 MG/0.5ML ~~LOC~~ SOLN
120.0000 mg | Freq: Once | SUBCUTANEOUS | Status: AC
  Administered 2023-02-10: 120 mg via SUBCUTANEOUS
  Filled 2023-02-10: qty 120

## 2023-02-10 NOTE — Telephone Encounter (Signed)
Received call back from pt regarding recent PET scan.  Advised that that her PET dotatate scan was stable overall. There were no changes in the bone lesions. There was one new small spot in the lungs which we will continue to monitor. We will plan to see her back as scheduled in Oct 2024.  Pt voiced understanding and is aware of her upcoming appts.

## 2023-03-07 ENCOUNTER — Inpatient Hospital Stay: Payer: 59

## 2023-03-07 ENCOUNTER — Inpatient Hospital Stay: Payer: 59 | Attending: Hematology and Oncology

## 2023-03-07 VITALS — BP 157/102 | HR 94 | Temp 98.7°F | Resp 18

## 2023-03-07 DIAGNOSIS — C7A025 Malignant carcinoid tumor of the sigmoid colon: Secondary | ICD-10-CM | POA: Insufficient documentation

## 2023-03-07 DIAGNOSIS — Z79899 Other long term (current) drug therapy: Secondary | ICD-10-CM | POA: Insufficient documentation

## 2023-03-07 DIAGNOSIS — C7B8 Other secondary neuroendocrine tumors: Secondary | ICD-10-CM | POA: Insufficient documentation

## 2023-03-07 DIAGNOSIS — C7A8 Other malignant neuroendocrine tumors: Secondary | ICD-10-CM | POA: Insufficient documentation

## 2023-03-07 DIAGNOSIS — C7B03 Secondary carcinoid tumors of bone: Secondary | ICD-10-CM | POA: Insufficient documentation

## 2023-03-07 LAB — CBC WITH DIFFERENTIAL (CANCER CENTER ONLY)
Abs Immature Granulocytes: 0.01 10*3/uL (ref 0.00–0.07)
Basophils Absolute: 0 10*3/uL (ref 0.0–0.1)
Basophils Relative: 0 %
Eosinophils Absolute: 0.1 10*3/uL (ref 0.0–0.5)
Eosinophils Relative: 2 %
HCT: 38.9 % (ref 36.0–46.0)
Hemoglobin: 12.7 g/dL (ref 12.0–15.0)
Immature Granulocytes: 0 %
Lymphocytes Relative: 30 %
Lymphs Abs: 2 10*3/uL (ref 0.7–4.0)
MCH: 27.7 pg (ref 26.0–34.0)
MCHC: 32.6 g/dL (ref 30.0–36.0)
MCV: 84.7 fL (ref 80.0–100.0)
Monocytes Absolute: 0.6 10*3/uL (ref 0.1–1.0)
Monocytes Relative: 9 %
Neutro Abs: 4.1 10*3/uL (ref 1.7–7.7)
Neutrophils Relative %: 59 %
Platelet Count: 248 10*3/uL (ref 150–400)
RBC: 4.59 MIL/uL (ref 3.87–5.11)
RDW: 14.1 % (ref 11.5–15.5)
WBC Count: 6.8 10*3/uL (ref 4.0–10.5)
nRBC: 0 % (ref 0.0–0.2)

## 2023-03-07 LAB — CMP (CANCER CENTER ONLY)
ALT: 13 U/L (ref 0–44)
AST: 15 U/L (ref 15–41)
Albumin: 4.5 g/dL (ref 3.5–5.0)
Alkaline Phosphatase: 69 U/L (ref 38–126)
Anion gap: 8 (ref 5–15)
BUN: 8 mg/dL (ref 6–20)
CO2: 29 mmol/L (ref 22–32)
Calcium: 9.6 mg/dL (ref 8.9–10.3)
Chloride: 103 mmol/L (ref 98–111)
Creatinine: 0.67 mg/dL (ref 0.44–1.00)
GFR, Estimated: >60 mL/min (ref >60)
Glucose, Bld: 122 mg/dL — ABNORMAL HIGH (ref 70–99)
Potassium: 3.6 mmol/L (ref 3.5–5.1)
Sodium: 140 mmol/L (ref 135–145)
Total Bilirubin: 0.5 mg/dL (ref 0.3–1.2)
Total Protein: 7.9 g/dL (ref 6.5–8.1)

## 2023-03-07 MED ORDER — LANREOTIDE ACETATE 120 MG/0.5ML ~~LOC~~ SOLN
120.0000 mg | Freq: Once | SUBCUTANEOUS | Status: AC
  Administered 2023-03-07: 120 mg via SUBCUTANEOUS
  Filled 2023-03-07: qty 120

## 2023-03-07 NOTE — Patient Instructions (Signed)
Lanreotide Injection What is this medication? LANREOTIDE (lan REE oh tide) treats high levels of growth hormone (acromegaly). It is used when other therapies have not worked well enough or cannot be tolerated. It works by reducing the amount of growth hormone your body makes. This reduces symptoms and the risk of health problems caused by too much growth hormone, such as diabetes and heart disease. It may also be used to treat neuroendocrine tumors, a cancer of the cells that release hormones and other substances in your body. It works by slowing down the release of these substances from the cells. This slows tumor growth. It also decreases the symptoms of carcinoid syndrome, such as flushing or diarrhea. This medicine may be used for other purposes; ask your health care provider or pharmacist if you have questions. COMMON BRAND NAME(S): Somatuline Depot What should I tell my care team before I take this medication? They need to know if you have any of these conditions: Diabetes Gallbladder disease Heart disease Kidney disease Liver disease Thyroid disease An unusual or allergic reaction to lanreotide, other medications, foods, dyes, or preservatives Pregnant or trying to get pregnant Breast-feeding How should I use this medication? This medication is injected under the skin. It is given by your care team in a hospital or clinic setting. Talk to your care team about the use of this medication in children. Special care may be needed. Overdosage: If you think you have taken too much of this medicine contact a poison control center or emergency room at once. NOTE: This medicine is only for you. Do not share this medicine with others. What if I miss a dose? Keep appointments for follow-up doses. It is important not to miss your dose. Call your care team if you are unable to keep an appointment. What may interact with this medication? Bromocriptine Cyclosporine Certain medications for blood  pressure, heart disease, irregular heartbeat Certain medications for diabetes Quinidine Terfenadine This list may not describe all possible interactions. Give your health care provider a list of all the medicines, herbs, non-prescription drugs, or dietary supplements you use. Also tell them if you smoke, drink alcohol, or use illegal drugs. Some items may interact with your medicine. What should I watch for while using this medication? Visit your care team for regular checks on your progress. Tell your care team if your symptoms do not start to get better or if they get worse. Your condition will be monitored carefully while you are receiving this medication. You may need blood work while you are taking this medication. This medication may increase blood sugar. The risk may be higher in patients who already have diabetes. Ask your care team what you can do to lower your risk of diabetes while taking this medication. Talk to your care team if you wish to become pregnant or think you may be pregnant. This medication can cause serious birth defects. Do not breast-feed while taking this medication and for 6 months after stopping therapy. This medication may cause infertility. Talk to your care team if you are concerned about your fertility. What side effects may I notice from receiving this medication? Side effects that you should report to your care team as soon as possible: Allergic reactions--skin rash, itching, hives, swelling of the face, lips, tongue, or throat Gallbladder problems--severe stomach pain, nausea, vomiting, fever High blood sugar (hyperglycemia)--increased thirst or amount of urine, unusual weakness or fatigue, blurry vision Increase in blood pressure Low blood sugar (hypoglycemia)--tremors or shaking, anxiety, sweating, cold   or clammy skin, confusion, dizziness, rapid heartbeat Low thyroid levels (hypothyroidism)--unusual weakness or fatigue, increased sensitivity to cold,  constipation, hair loss, dry skin, weight gain, feelings of depression Slow heartbeat--dizziness, feeling faint or lightheaded, confusion, trouble breathing, unusual weakness or fatigue Side effects that usually do not require medical attention (report to your care team if they continue or are bothersome): Diarrhea Dizziness Headache Muscle spasms Nausea Pain, redness, irritation, or bruising at the injection site Stomach pain This list may not describe all possible side effects. Call your doctor for medical advice about side effects. You may report side effects to FDA at 1-800-FDA-1088. Where should I keep my medication? This medication is given in a hospital or clinic. It will not be stored at home. NOTE: This sheet is a summary. It may not cover all possible information. If you have questions about this medicine, talk to your doctor, pharmacist, or health care provider.  2024 Elsevier/Gold Standard (2021-11-10 00:00:00)  

## 2023-03-08 LAB — CHROMOGRANIN A: Chromogranin A (ng/mL): 27.5 ng/mL (ref 0.0–101.8)

## 2023-04-05 ENCOUNTER — Inpatient Hospital Stay: Payer: 59 | Attending: Hematology and Oncology

## 2023-04-05 ENCOUNTER — Inpatient Hospital Stay: Payer: 59 | Attending: Hematology and Oncology | Admitting: Hematology and Oncology

## 2023-04-05 ENCOUNTER — Inpatient Hospital Stay: Payer: 59

## 2023-04-05 VITALS — BP 148/108 | HR 97 | Temp 97.8°F | Resp 16 | Wt 178.6 lb

## 2023-04-05 DIAGNOSIS — C7B03 Secondary carcinoid tumors of bone: Secondary | ICD-10-CM | POA: Diagnosis present

## 2023-04-05 DIAGNOSIS — C7B8 Other secondary neuroendocrine tumors: Secondary | ICD-10-CM

## 2023-04-05 DIAGNOSIS — C7A8 Other malignant neuroendocrine tumors: Secondary | ICD-10-CM

## 2023-04-05 DIAGNOSIS — F17210 Nicotine dependence, cigarettes, uncomplicated: Secondary | ICD-10-CM | POA: Diagnosis not present

## 2023-04-05 DIAGNOSIS — C7A025 Malignant carcinoid tumor of the sigmoid colon: Secondary | ICD-10-CM | POA: Insufficient documentation

## 2023-04-05 DIAGNOSIS — Z8572 Personal history of non-Hodgkin lymphomas: Secondary | ICD-10-CM | POA: Diagnosis not present

## 2023-04-05 LAB — CBC WITH DIFFERENTIAL (CANCER CENTER ONLY)
Abs Immature Granulocytes: 0.01 10*3/uL (ref 0.00–0.07)
Basophils Absolute: 0 10*3/uL (ref 0.0–0.1)
Basophils Relative: 1 %
Eosinophils Absolute: 0.1 10*3/uL (ref 0.0–0.5)
Eosinophils Relative: 2 %
HCT: 36 % (ref 36.0–46.0)
Hemoglobin: 11.8 g/dL — ABNORMAL LOW (ref 12.0–15.0)
Immature Granulocytes: 0 %
Lymphocytes Relative: 37 %
Lymphs Abs: 2.4 10*3/uL (ref 0.7–4.0)
MCH: 27.6 pg (ref 26.0–34.0)
MCHC: 32.8 g/dL (ref 30.0–36.0)
MCV: 84.3 fL (ref 80.0–100.0)
Monocytes Absolute: 0.6 10*3/uL (ref 0.1–1.0)
Monocytes Relative: 10 %
Neutro Abs: 3.3 10*3/uL (ref 1.7–7.7)
Neutrophils Relative %: 50 %
Platelet Count: 281 10*3/uL (ref 150–400)
RBC: 4.27 MIL/uL (ref 3.87–5.11)
RDW: 13.9 % (ref 11.5–15.5)
WBC Count: 6.6 10*3/uL (ref 4.0–10.5)
nRBC: 0 % (ref 0.0–0.2)

## 2023-04-05 LAB — CMP (CANCER CENTER ONLY)
ALT: 10 U/L (ref 0–44)
AST: 12 U/L — ABNORMAL LOW (ref 15–41)
Albumin: 4.1 g/dL (ref 3.5–5.0)
Alkaline Phosphatase: 74 U/L (ref 38–126)
Anion gap: 7 (ref 5–15)
BUN: 14 mg/dL (ref 6–20)
CO2: 29 mmol/L (ref 22–32)
Calcium: 10.3 mg/dL (ref 8.9–10.3)
Chloride: 103 mmol/L (ref 98–111)
Creatinine: 0.86 mg/dL (ref 0.44–1.00)
GFR, Estimated: >60 mL/min (ref >60)
Glucose, Bld: 123 mg/dL — ABNORMAL HIGH (ref 70–99)
Potassium: 3.6 mmol/L (ref 3.5–5.1)
Sodium: 139 mmol/L (ref 135–145)
Total Bilirubin: 0.3 mg/dL (ref 0.3–1.2)
Total Protein: 7.4 g/dL (ref 6.5–8.1)

## 2023-04-05 MED ORDER — SODIUM CHLORIDE 0.9 % IV SOLN: INTRAVENOUS | Status: DC

## 2023-04-05 MED ORDER — ZOLEDRONIC ACID 4 MG/100ML IV SOLN
4.0000 mg | INTRAVENOUS | Status: DC
  Administered 2023-04-05: 4 mg via INTRAVENOUS
  Filled 2023-04-05: qty 100

## 2023-04-05 MED ORDER — LANREOTIDE ACETATE 120 MG/0.5ML ~~LOC~~ SOLN
120.0000 mg | Freq: Once | SUBCUTANEOUS | Status: AC
  Administered 2023-04-05: 120 mg via SUBCUTANEOUS
  Filled 2023-04-05: qty 120

## 2023-04-05 NOTE — Progress Notes (Signed)
[facility] [phone]   [phone]  PROGRESS NOTE  Patient Care Team: Corwin Levins, MD as PCP - General  Hematological/Oncological History # Metastatic Neuroendocrine Tumor, Metastasis to the Spine 1) 08/26/2019: CT Abdomen Pelvis performed due to RUQ abdominal pain. Imaging showed a soft tissue mass abutting the right posterior eighth rib 2) 09/06/2019: CT chest performed which showed Ill-defined soft tissue nodule along the medial right posterior thoracic wall at the 8th intercostal space to the right of the spine. MRI was recommended. 3) 10/19/2019: MRI Thoracic spine showed enhancing lesions throughout the visualized spine consistent with metastatic disease. Additionally there was a 1.5 cm soft tissue nodule in the posteromedial right eighth intercostal space, more concerning for a metastasis 4) 10/25/2019: establish care with Dr. Leonides Schanz   5) 11/05/2019: attempted biopsy of soft tissue mass at right posterior 8th rib, but lesion had dissipated at time of biopsy attempt 6) 11/27/2019: PET CT scan performed showing right inguinal lymph nodes and osseous lesions, indicative of metastatic disease of unknown primary. 7) 12/11/2019: CT bone marrow biopsy showed metastatic neuroendocrine neoplasm 8) 12/27/2019: start of lanreotide 120mg  sub q28 days  9) 10/15/2020: NM PET (CU-64) scan showed widespread osseous and lymphatic uptake consistent with metastatic spread of neuroendocrine tumor.  This is more avid than prior. 01/20/2021: clinic visit with Dr. Nolen Mu due to concern for possible mantle cell lymphoma seen on pathology review of previous biopsy 04/15/2021: Dose 1 (of 4) of lutathera treatment with nuclear medicine.  06/10/2021: Dose 2 (of 4) of lutathera treatment with nuclear medicine.  08/05/2021: Dose 3 (of 4) of lutathera treatment with nuclear medicine.  09/03/2021:Dose 4 (of 4) of lutathera treatment with nuclear medicine.  11/26/2021: NM Copper Dotatate Scan  shows dramatic reduction in number and activity of skeletal metastasis.   #Low Grade Neuroendocrine Tumor 1) 2006: reportedly had rectal carcinoid tumor removed 2) 08/14/2008: patient had a flexibile sigmoidoscopy with endoscopic ultrasound which resected an 8mm, subepithelial lesion in rectum. Findings consistent with low grade neuroendocrine tumor. 3) 04/08/2010: repeat colonoscopy, no residual tumor. Repeat recommended in 2016.  4) 12/11/2019: metastatic recurrence found on biopsy, noted above. 5) 11/26/2021:  NM PET CU-64 showed a dramatic reduction in the number and activity as skeletal metastases.  #Mantle Cell Lymphoma 11/24/2020: incidental findings of low level mantle cell lymphoma on bone marrow biopsy. Noted on outside review of pathology at [facility].  01/20/2021: clinic visit with Dr. Nolen Mu due to concern for possible mantle cell lymphoma seen on pathology review of previous biopsy  # Meningioma  08/09/2022: MRI Brain ordered due to PET scan showing concern for meningioma. Scan showed dural-based mass at the right posterior frontal vertex, performed at [facility]  10/24/2022: 2nd opinion at [facility] 11/04/2022: MRI Brain showed meningioma measuring 3.5 x 3.5 x 2.1 cm.   Interval History:  Michelle Solomon 62 y.o. female with medical history significant for metastatic neuroendocrine tumor of the colon who presents for a follow up visit. The patient's last visit was on 01/10/2023. In the interim since the last visit she has had no major changes in her health.  On exam today Ms. Berish reports she is being followed for her meningioma at [facility].  She reports using the visit coming up in November 2024.  She notes that they have requested that we get the scan here locally.  She notes that she is tolerating her lanreotide therapy well with no nausea, vomiting, diarrhea.  She reports she does have fatigue on  occasion and has to rest after exertion.  She does that she is not having any new bone pain or  back pain.  She notes that her appetite has been a little lower than usual.  She is lost about 5 pounds interim since her last visit in July.  Overall she is willing and able to proceed with lanreotide shots at this time.  She denies any fevers, chills, shortness of breath, chest pain, palpitations or cough. She has no other complaints.   A full 10 point ROS is listed below.  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   Cancer (HCC)    Neuro endocrine tumor   CONJUNCTIVITIS, ALLERGIC 11/27/2008   HEMORRHOIDS 06/16/2008   HTN (hypertension)    Hx of adenomatous colonic polyps 11/2019   NIPPLE DISCHARGE 01/21/2010   Other specified forms of hearing loss 10/08/2009   Overweight(278.02) 05/29/2007   RASH-NONVESICULAR 05/29/2007   Wheezing 06/22/2010    SURGICAL HISTORY: Past Surgical History:  Procedure Laterality Date   COLONOSCOPY      SOCIAL HISTORY: Social History   Socioeconomic History   Marital status: Widowed    Spouse name: Not on file   Number of children: 3   Years of education: Not on file   Highest education level: Not on file  Occupational History   Occupation: CARD REPLACEMEN    Employer: [employer]   Occupation: customer service  Tobacco Use   Smoking status: Some Days    Current packs/day: 0.00    Average packs/day: 0.5 packs/day for 40.0 years (20.0 ttl pk-yrs)    Types: Cigarettes    Start date: 10/22/1979    Last attempt to quit: 10/22/2019    Years since quitting: 3.4   Smokeless tobacco: Never   Tobacco comments:    1 pack weekly.  started smoking at 62 years old  Vaping Use   Vaping status: Never Used  Substance and Sexual Activity   Alcohol use: Yes    Comment: ocassional   Drug use:  No   Sexual activity: Not Currently  Other Topics Concern   Not on file  Social History Narrative   Daily caffeine use 3   Patient does not get regular exercise   Social Determinants of Health   Financial Resource Strain: Not on file  Food Insecurity: Not on file  Transportation Needs: Not on file  Physical Activity: Not on file  Stress: Not on file  Social Connections: Not on file  Intimate Partner Violence: Not on file    FAMILY HISTORY: Family History  Problem Relation Age of Onset   Allergies Sister    Diabetes Sister    Hypertension Sister    Hypertension Mother    Prostate cancer Father    Hypertension Brother    Hypertension Sister    Diabetes Paternal Aunt    Cancer Paternal Aunt        type unknown   Prostate cancer Brother    Stroke Maternal Aunt    Hypertension Maternal Aunt    Colon cancer Neg Hx    Esophageal cancer Neg Hx    Rectal cancer Neg Hx    Stomach cancer Neg Hx  ALLERGIES:  is allergic to shellfish allergy.  MEDICATIONS:  Current Outpatient Medications  Medication Sig Dispense Refill   loratadine (CLARITIN) 10 MG tablet Take 10 mg by mouth daily as needed for allergies.     buPROPion (WELLBUTRIN XL) 300 MG 24 hr tablet TAKE 1 TABLET(300 MG) BY MOUTH DAILY 90 tablet 3   cholecalciferol (VITAMIN D3) 25 MCG (1000 UNIT) tablet Take 1,000 Units by mouth daily.     GARLIC PO Take by mouth.     GINKGO BILOBA EXTRACT PO Take by mouth.     hydrochlorothiazide (HYDRODIURIL) 12.5 MG tablet TAKE 1 TABLET(12.5 MG) BY MOUTH DAILY 30 tablet 5   lanreotide acetate (SOMATULINE DEPOT) 120 MG/0.5ML injection Inject 120 mg into the skin every 28 (twenty-eight) days. 0.5 mL 12   losartan (COZAAR) 50 MG tablet Take 1 tablet (50 mg total) by mouth daily. 90 tablet 3   ondansetron (ZOFRAN) 8 MG tablet Take 1 tablet (8 mg total) by mouth 2 (two) times daily as needed for nausea or vomiting. (Patient not taking: Reported on 01/10/2023) 20 tablet 0    oxyCODONE-acetaminophen (PERCOCET/ROXICET) 5-325 MG tablet Take 1 tablet by mouth every 6 (six) hours as needed for severe pain. (Patient not taking: Reported on 01/10/2023) 30 tablet 0   Turmeric 500 MG CAPS Take 500 mg by mouth daily.     vitamin B-12 (CYANOCOBALAMIN) 100 MCG tablet Take 100 mcg by mouth daily.     VITAMIN D-VITAMIN K PO Take 1 tablet by mouth daily.     zolpidem (AMBIEN) 10 MG tablet Take 1 tablet (10 mg total) by mouth at bedtime as needed for sleep. 90 tablet 1   No current facility-administered medications for this visit.   Facility-Administered Medications Ordered in Other Visits  Medication Dose Route Frequency Provider Last Rate Last Admin   octreotide (SANDOSTATIN LAR) 30 MG IM injection             REVIEW OF SYSTEMS:   Constitutional: ( - ) fevers, ( - )  chills , ( - ) night sweats Eyes: ( - ) blurriness of vision, ( - ) double vision, ( - ) watery eyes Ears, nose, mouth, throat, and face: ( - ) mucositis, ( - ) sore throat Respiratory: ( - ) cough, ( - ) dyspnea, ( - ) wheezes Cardiovascular: ( - ) palpitation, ( - ) chest discomfort, ( - ) lower extremity swelling Gastrointestinal:  ( - ) nausea, ( - ) heartburn, ( - ) change in bowel habits Skin: ( - ) abnormal skin rashes Lymphatics: ( - ) new lymphadenopathy, ( - ) easy bruising Neurological: ( - ) numbness, ( - ) tingling, ( - ) new weaknesses Behavioral/Psych: ( - ) mood change, ( - ) new changes  All other systems were reviewed with the patient and are negative.  PHYSICAL EXAMINATION: ECOG PERFORMANCE STATUS: 0 - Asymptomatic  Vitals:   04/05/23 0936  BP: (!) 148/108  Pulse: 97  Resp: 16  Temp: 97.8 F (36.6 C)  SpO2: 100%       Filed Weights   04/05/23 0936  Weight: 178 lb 9.6 oz (81 kg)     GENERAL: well appearing middle aged [place] American female in NAD  SKIN: skin color, texture, turgor are normal, no rashes or significant lesions EYES: conjunctiva are pink and non-injected,  sclera clear LUNGS: clear to auscultation and percussion with normal breathing effort HEART: regular rate & rhythm and no murmurs and no lower extremity  edema Musculoskeletal: no cyanosis of digits and no clubbing  PSYCH: alert & oriented x 3, fluent speech NEURO: no focal motor/sensory deficits  LABORATORY DATA:  I have reviewed the data as listed    Latest Ref Rng & Units 04/05/2023    8:57 AM 03/07/2023   12:30 PM 02/10/2023    9:12 AM  CBC  WBC 4.0 - 10.5 K/uL 6.6  6.8  7.1   Hemoglobin 12.0 - 15.0 g/dL 16.1  09.6  04.5   Hematocrit 36.0 - 46.0 % 36.0  38.9  36.1   Platelets 150 - 400 K/uL 281  248  246        Latest Ref Rng & Units 04/05/2023    8:57 AM 03/07/2023   12:30 PM 02/10/2023    9:12 AM  CMP  Glucose 70 - 99 mg/dL 409  811  914   BUN 6 - 20 mg/dL 14  8  14    Creatinine 0.44 - 1.00 mg/dL 7.82  9.56  2.13   Sodium 135 - 145 mmol/L 139  140  141   Potassium 3.5 - 5.1 mmol/L 3.6  3.6  3.6   Chloride 98 - 111 mmol/L 103  103  106   CO2 22 - 32 mmol/L 29  29  29    Calcium 8.9 - 10.3 mg/dL 08.6  9.6  8.8   Total Protein 6.5 - 8.1 g/dL 7.4  7.9  7.0   Total Bilirubin 0.3 - 1.2 mg/dL 0.3  0.5  0.3   Alkaline Phos 38 - 126 U/L 74  69  67   AST 15 - 41 U/L 12  15  12    ALT 0 - 44 U/L 10  13  10      RADIOGRAPHIC STUDIES: I have personally reviewed the radiological images as listed and agreed with the findings in the report.  No results found.   ASSESSMENT & PLAN Michelle Solomon 62 y.o. female with medical history significant for metastatic neuroendocrine tumor of the colon who presents for a follow up visit.    # Metastatic Neuroendocrine Tumor, Metastasis to the Spine  --previously discussed with patient that the options would include observation with serial imaging or starting lanreotide therapy. The patient opted to start treatment --will plan for continued lanreotide 120mg  subq q28 days. Started therapy on 12/27/2019. This will be continued until progression or  intolerance to therapy. Next dose due on 02/19/2021 --Due to the extensive involvement of her skeleton and increased activity on her last nuclear medicine scan it was recommended that she be considered for Lu 177 dotatate treatment. Initiated treatment on 04/15/2021 with plans to complete a total of 4 treatments.  --CU-64 scan q 3 months. Last in April 2022. Completed PET CT scan at [facility] on 02/02/2021, no evidence of lymphoma.  --Patient expressed interest in a second opinion.  She saw Dr. Lattie Corns at [facility].  He provided excellent advice regarding options moving forward.  After hearing all the different options the patient noted she would like to continue her current lanreotide shots with consideration of increased frequency if she were to be found to be progressing --patient underwent treatment with Lutathera therapy. She is s/p treatment 4 of 4. They administered 30 mg IM long acting sandostatin on days where she undergoes Lutathera treatment (q 8 weeks x 4 doses). Completed the 4th Lutathera treatment on 09/30/21  Plan:  --continue lanreotide 120 mg q 28 days with q 3 month zometa --Last Zometa infusion completed  on 01/10/2023. Received Zometa as scheduled today. --Labs today show white blood cell count 6.6, hemoglobin 11.8, MCV 84.3, and platelets of 281. Creatinine and LFTs normal. Chromogranin levels pending today. Last chromogranin level from 09/16/2022 was normal at 26.2. --Last PET dotatate scan did show concern for progression with new lung nodule and some new areas of FDG avidity, however the underlying sclerotic lesions were stable.  Will order repeat scan earlier in order to reassess --RTC in 12 weeks with interval continued monthly lanreotide injections.   #Mantle Cell Lymphoma -- Incidental finding from prior biopsy results.  This was detected on secondary pathological review at [facility] when the patient went for second opinion --Unclear if this  represents a small low-grade clinically insignificant population of mantle cell lymphoma or a more concerning issue --Patient following with Dr. Nolen Mu at [facility] --PET CT scan on 02/02/2021 showed no evidence of lymphoma.  --We will continue to monitor and appreciate the guidance of Dr. Nolen Mu  #Possible Meningioma: --Seen on MRI brain  from 08/09/2022 at [facility], measuring 3.6 cm along the right frontal convexity with broad-based dural attachment.  --Evaluated by Dr. Sharilyn Sites from [facility] who recommended surgical resection.  --Patient seen for a second opinion at [facility] on 10/30/2022 --current plan for repeat scan in Oct/Nov 2024  No orders of the defined types were placed in this encounter.  All questions were answered. The patient knows to call the clinic with any problems, questions or concerns.  I have spent a total of 30 minutes minutes of face-to-face and non-face-to-face time, preparing to see the patient,performing a medically appropriate examination, counseling and educating the patient, documenting clinical information in the electronic health record, and care coordination.   Ulysees Barns, MD Department of Hematology/Oncology [facility] Phone: [phone] Pager: [phone] Email: [email or web address]    04/09/2023 8:32 PM

## 2023-04-05 NOTE — Progress Notes (Signed)
Per Dr. Leonides Schanz, OK to administer Zometa and lanreotide injection today.  Pt denies dental issues, dental/jaw pain, upcoming or recent dental procedures.

## 2023-04-05 NOTE — Patient Instructions (Signed)
Zoledronic Acid Injection (Cancer) What is this medication? ZOLEDRONIC ACID (ZOE le dron ik AS id) treats high calcium levels in the blood caused by cancer. It may also be used with chemotherapy to treat weakened bones caused by cancer. It works by slowing down the release of calcium from bones. This lowers calcium levels in your blood. It also makes your bones stronger and less likely to break (fracture). It belongs to a group of medications called bisphosphonates. This medicine may be used for other purposes; ask your health care provider or pharmacist if you have questions. COMMON BRAND NAME(S): Zometa, Zometa Powder What should I tell my care team before I take this medication? They need to know if you have any of these conditions: Dehydration Dental disease Kidney disease Liver disease Low levels of calcium in the blood Lung or breathing disease, such as asthma Receiving steroids, such as dexamethasone or prednisone An unusual or allergic reaction to zoledronic acid, other medications, foods, dyes, or preservatives Pregnant or trying to get pregnant Breast-feeding How should I use this medication? This medication is injected into a vein. It is given by your care team in a hospital or clinic setting. Talk to your care team about the use of this medication in children. Special care may be needed. Overdosage: If you think you have taken too much of this medicine contact a poison control center or emergency room at once. NOTE: This medicine is only for you. Do not share this medicine with others. What if I miss a dose? Keep appointments for follow-up doses. It is important not to miss your dose. Call your care team if you are unable to keep an appointment. What may interact with this medication? Certain antibiotics given by injection Diuretics, such as bumetanide, furosemide NSAIDs, medications for pain and inflammation, such as ibuprofen or naproxen Teriparatide Thalidomide This list  may not describe all possible interactions. Give your health care provider a list of all the medicines, herbs, non-prescription drugs, or dietary supplements you use. Also tell them if you smoke, drink alcohol, or use illegal drugs. Some items may interact with your medicine. What should I watch for while using this medication? Visit your care team for regular checks on your progress. It may be some time before you see the benefit from this medication. Some people who take this medication have severe bone, joint, or muscle pain. This medication may also increase your risk for jaw problems or a broken thigh bone. Tell your care team right away if you have severe pain in your jaw, bones, joints, or muscles. Tell you care team if you have any pain that does not go away or that gets worse. Tell your dentist and dental surgeon that you are taking this medication. You should not have major dental surgery while on this medication. See your dentist to have a dental exam and fix any dental problems before starting this medication. Take good care of your teeth while on this medication. Make sure you see your dentist for regular follow-up appointments. You should make sure you get enough calcium and vitamin D while you are taking this medication. Discuss the foods you eat and the vitamins you take with your care team. Check with your care team if you have severe diarrhea, nausea, and vomiting, or if you sweat a lot. The loss of too much body fluid may make it dangerous for you to take this medication. You may need bloodwork while taking this medication. Talk to your care team if  you wish to become pregnant or think you might be pregnant. This medication can cause serious birth defects. What side effects may I notice from receiving this medication? Side effects that you should report to your care team as soon as possible: Allergic reactions--skin rash, itching, hives, swelling of the face, lips, tongue, or  throat Kidney injury--decrease in the amount of urine, swelling of the ankles, hands, or feet Low calcium level--muscle pain or cramps, confusion, tingling, or numbness in the hands or feet Osteonecrosis of the jaw--pain, swelling, or redness in the mouth, numbness of the jaw, poor healing after dental work, unusual discharge from the mouth, visible bones in the mouth Severe bone, joint, or muscle pain Side effects that usually do not require medical attention (report to your care team if they continue or are bothersome): Constipation Fatigue Fever Loss of appetite Nausea Stomach pain This list may not describe all possible side effects. Call your doctor for medical advice about side effects. You may report side effects to FDA at 1-800-FDA-1088. Where should I keep my medication? This medication is given in a hospital or clinic. It will not be stored at home. NOTE: This sheet is a summary. It may not cover all possible information. If you have questions about this medicine, talk to your doctor, pharmacist, or health care provider.  2024 [employer] Standard (2021-08-13 00:00:00)  Lanreotide Injection What is this medication? LANREOTIDE (lan REE oh tide) treats high levels of growth hormone (acromegaly). It is used when other therapies have not worked well enough or cannot be tolerated. It works by reducing the amount of growth hormone your body makes. This reduces symptoms and the risk of health problems caused by too much growth hormone, such as diabetes and heart disease. It may also be used to treat neuroendocrine tumors, a cancer of the cells that release hormones and other substances in your body. It works by slowing down the release of these substances from the cells. This slows tumor growth. It also decreases the symptoms of carcinoid syndrome, such as flushing or diarrhea. This medicine may be used for other purposes; ask your health care provider or pharmacist if you have  questions. COMMON BRAND NAME(S): Somatuline Depot What should I tell my care team before I take this medication? They need to know if you have any of these conditions: Diabetes Gallbladder disease Heart disease Kidney disease Liver disease Thyroid disease An unusual or allergic reaction to lanreotide, other medications, foods, dyes, or preservatives Pregnant or trying to get pregnant Breast-feeding How should I use this medication? This medication is injected under the skin. It is given by your care team in a hospital or clinic setting. Talk to your care team about the use of this medication in children. Special care may be needed. Overdosage: If you think you have taken too much of this medicine contact a poison control center or emergency room at once. NOTE: This medicine is only for you. Do not share this medicine with others. What if I miss a dose? Keep appointments for follow-up doses. It is important not to miss your dose. Call your care team if you are unable to keep an appointment. What may interact with this medication? Bromocriptine Cyclosporine Certain medications for blood pressure, heart disease, irregular heartbeat Certain medications for diabetes Quinidine Terfenadine This list may not describe all possible interactions. Give your health care provider a list of all the medicines, herbs, non-prescription drugs, or dietary supplements you use. Also tell them if you smoke, drink  alcohol, or use illegal drugs. Some items may interact with your medicine. What should I watch for while using this medication? Visit your care team for regular checks on your progress. Tell your care team if your symptoms do not start to get better or if they get worse. Your condition will be monitored carefully while you are receiving this medication. You may need blood work while you are taking this medication. This medication may increase blood sugar. The risk may be higher in patients who  already have diabetes. Ask your care team what you can do to lower your risk of diabetes while taking this medication. Talk to your care team if you wish to become pregnant or think you may be pregnant. This medication can cause serious birth defects. Do not breast-feed while taking this medication and for 6 months after stopping therapy. This medication may cause infertility. Talk to your care team if you are concerned about your fertility. What side effects may I notice from receiving this medication? Side effects that you should report to your care team as soon as possible: Allergic reactions--skin rash, itching, hives, swelling of the face, lips, tongue, or throat Gallbladder problems--severe stomach pain, nausea, vomiting, fever High blood sugar (hyperglycemia)--increased thirst or amount of urine, unusual weakness or fatigue, blurry vision Increase in blood pressure Low blood sugar (hypoglycemia)--tremors or shaking, anxiety, sweating, cold or clammy skin, confusion, dizziness, rapid heartbeat Low thyroid levels (hypothyroidism)--unusual weakness or fatigue, increased sensitivity to cold, constipation, hair loss, dry skin, weight gain, feelings of depression Slow heartbeat--dizziness, feeling faint or lightheaded, confusion, trouble breathing, unusual weakness or fatigue Side effects that usually do not require medical attention (report to your care team if they continue or are bothersome): Diarrhea Dizziness Headache Muscle spasms Nausea Pain, redness, irritation, or bruising at the injection site Stomach pain This list may not describe all possible side effects. Call your doctor for medical advice about side effects. You may report side effects to FDA at 1-800-FDA-1088. Where should I keep my medication? This medication is given in a hospital or clinic. It will not be stored at home. NOTE: This sheet is a summary. It may not cover all possible information. If you have questions about  this medicine, talk to your doctor, pharmacist, or health care provider.  2024 [employer] Standard (2021-11-10 00:00:00)

## 2023-04-06 ENCOUNTER — Telehealth: Payer: Self-pay | Admitting: Hematology and Oncology

## 2023-04-06 LAB — CHROMOGRANIN A: Chromogranin A (ng/mL): 26.2 ng/mL (ref 0.0–101.8)

## 2023-04-09 ENCOUNTER — Encounter: Payer: Self-pay | Admitting: Hematology and Oncology

## 2023-04-19 ENCOUNTER — Telehealth: Payer: Self-pay

## 2023-04-19 NOTE — Telephone Encounter (Signed)
Spoke to patient informing her that her [employer] F[employer]A documents had been completed and faxed to the company. Fax confirmation received. Copy of documents mailed to patient per request. No further concerns mentioned at this time.

## 2023-04-22 ENCOUNTER — Other Ambulatory Visit: Payer: Self-pay | Admitting: Internal Medicine

## 2023-04-24 ENCOUNTER — Other Ambulatory Visit: Payer: Self-pay

## 2023-04-25 ENCOUNTER — Telehealth: Payer: Self-pay

## 2023-04-25 NOTE — Telephone Encounter (Signed)
Patient called in with questions about FMLA that was completed. Per her job there was an answer missing from one of the questions. FMLA updated and sent to provider for new signature. Patient aware and has no other concerns at this time

## 2023-04-27 ENCOUNTER — Telehealth: Payer: Self-pay

## 2023-04-27 NOTE — Telephone Encounter (Signed)
Informed patient that her [employer] FMLA documents had been updated and faxed to the company. Fax conformation received. Patient had no further concerns at this time.

## 2023-05-02 ENCOUNTER — Inpatient Hospital Stay: Payer: 59

## 2023-05-02 ENCOUNTER — Other Ambulatory Visit: Payer: Self-pay | Admitting: Hematology and Oncology

## 2023-05-02 ENCOUNTER — Encounter: Payer: Self-pay | Admitting: Hematology and Oncology

## 2023-05-02 VITALS — BP 153/103 | HR 93 | Temp 98.5°F | Resp 18

## 2023-05-02 DIAGNOSIS — C7A025 Malignant carcinoid tumor of the sigmoid colon: Secondary | ICD-10-CM | POA: Diagnosis not present

## 2023-05-02 DIAGNOSIS — C7B8 Other secondary neuroendocrine tumors: Secondary | ICD-10-CM

## 2023-05-02 LAB — CMP (CANCER CENTER ONLY)
ALT: 18 U/L (ref 0–44)
AST: 26 U/L (ref 15–41)
Albumin: 4.3 g/dL (ref 3.5–5.0)
Alkaline Phosphatase: 80 U/L (ref 38–126)
Anion gap: 6 (ref 5–15)
BUN: 9 mg/dL (ref 6–20)
CO2: 31 mmol/L (ref 22–32)
Calcium: 9.8 mg/dL (ref 8.9–10.3)
Chloride: 100 mmol/L (ref 98–111)
Creatinine: 0.77 mg/dL (ref 0.44–1.00)
GFR, Estimated: >60 mL/min (ref >60)
Glucose, Bld: 144 mg/dL — ABNORMAL HIGH (ref 70–99)
Potassium: 4.2 mmol/L (ref 3.5–5.1)
Sodium: 137 mmol/L (ref 135–145)
Total Bilirubin: 0.6 mg/dL (ref 0.3–1.2)
Total Protein: 7.9 g/dL (ref 6.5–8.1)

## 2023-05-02 LAB — CBC WITH DIFFERENTIAL (CANCER CENTER ONLY)
Abs Immature Granulocytes: 0.01 10*3/uL (ref 0.00–0.07)
Basophils Absolute: 0 10*3/uL (ref 0.0–0.1)
Basophils Relative: 0 %
Eosinophils Absolute: 0.1 10*3/uL (ref 0.0–0.5)
Eosinophils Relative: 1 %
HCT: 37.2 % (ref 36.0–46.0)
Hemoglobin: 12.4 g/dL (ref 12.0–15.0)
Immature Granulocytes: 0 %
Lymphocytes Relative: 25 %
Lymphs Abs: 1.8 10*3/uL (ref 0.7–4.0)
MCH: 28.1 pg (ref 26.0–34.0)
MCHC: 33.3 g/dL (ref 30.0–36.0)
MCV: 84.2 fL (ref 80.0–100.0)
Monocytes Absolute: 0.8 10*3/uL (ref 0.1–1.0)
Monocytes Relative: 11 %
Neutro Abs: 4.4 10*3/uL (ref 1.7–7.7)
Neutrophils Relative %: 63 %
Platelet Count: 241 10*3/uL (ref 150–400)
RBC: 4.42 MIL/uL (ref 3.87–5.11)
RDW: 14.1 % (ref 11.5–15.5)
WBC Count: 7.1 10*3/uL (ref 4.0–10.5)
nRBC: 0 % (ref 0.0–0.2)

## 2023-05-02 MED ORDER — LANREOTIDE ACETATE 120 MG/0.5ML ~~LOC~~ SOLN
120.0000 mg | Freq: Once | SUBCUTANEOUS | Status: AC
  Administered 2023-05-02: 120 mg via SUBCUTANEOUS
  Filled 2023-05-02: qty 120

## 2023-05-10 ENCOUNTER — Other Ambulatory Visit: Payer: Self-pay | Admitting: Internal Medicine

## 2023-05-10 ENCOUNTER — Other Ambulatory Visit: Payer: Self-pay

## 2023-05-11 LAB — CHROMOGRANIN A: Chromogranin A (ng/mL): 22.2 ng/mL (ref 0.0–101.8)

## 2023-05-12 ENCOUNTER — Other Ambulatory Visit (HOSPITAL_BASED_OUTPATIENT_CLINIC_OR_DEPARTMENT_OTHER): Payer: Self-pay | Admitting: Neurological Surgery

## 2023-05-12 DIAGNOSIS — D329 Benign neoplasm of meninges, unspecified: Secondary | ICD-10-CM

## 2023-05-19 ENCOUNTER — Ambulatory Visit (HOSPITAL_BASED_OUTPATIENT_CLINIC_OR_DEPARTMENT_OTHER)
Admission: RE | Admit: 2023-05-19 | Discharge: 2023-05-19 | Disposition: A | Discharge status: Home / Self Care | Payer: 59 | Source: Ambulatory Visit | Attending: Neurological Surgery | Admitting: Neurological Surgery

## 2023-05-19 DIAGNOSIS — R900 Intracranial space-occupying lesion found on diagnostic imaging of central nervous system: Secondary | ICD-10-CM | POA: Diagnosis not present

## 2023-05-19 DIAGNOSIS — D329 Benign neoplasm of meninges, unspecified: Secondary | ICD-10-CM | POA: Insufficient documentation

## 2023-05-19 MED ORDER — GADOBUTROL 1 MMOL/ML IV SOLN
7.5000 mL | Freq: Once | INTRAVENOUS | Status: AC | PRN
  Administered 2023-05-19: 7.5 mL via INTRAVENOUS
  Filled 2023-05-19: qty 7.5

## 2023-05-23 ENCOUNTER — Telehealth: Payer: Self-pay | Admitting: Hematology

## 2023-05-30 ENCOUNTER — Inpatient Hospital Stay: Payer: 59

## 2023-06-06 ENCOUNTER — Other Ambulatory Visit: Payer: Self-pay | Admitting: Hematology and Oncology

## 2023-06-06 ENCOUNTER — Inpatient Hospital Stay: Payer: 59 | Attending: Hematology and Oncology

## 2023-06-06 ENCOUNTER — Inpatient Hospital Stay: Payer: 59

## 2023-06-06 VITALS — BP 157/95 | HR 85 | Temp 98.0°F | Resp 18

## 2023-06-06 DIAGNOSIS — F17210 Nicotine dependence, cigarettes, uncomplicated: Secondary | ICD-10-CM | POA: Diagnosis not present

## 2023-06-06 DIAGNOSIS — I10 Essential (primary) hypertension: Secondary | ICD-10-CM | POA: Insufficient documentation

## 2023-06-06 DIAGNOSIS — C7B03 Secondary carcinoid tumors of bone: Secondary | ICD-10-CM | POA: Insufficient documentation

## 2023-06-06 DIAGNOSIS — C7A025 Malignant carcinoid tumor of the sigmoid colon: Secondary | ICD-10-CM | POA: Diagnosis present

## 2023-06-06 DIAGNOSIS — J45909 Unspecified asthma, uncomplicated: Secondary | ICD-10-CM | POA: Diagnosis not present

## 2023-06-06 DIAGNOSIS — Z79899 Other long term (current) drug therapy: Secondary | ICD-10-CM | POA: Diagnosis not present

## 2023-06-06 DIAGNOSIS — F419 Anxiety disorder, unspecified: Secondary | ICD-10-CM | POA: Insufficient documentation

## 2023-06-06 DIAGNOSIS — C7B8 Other secondary neuroendocrine tumors: Secondary | ICD-10-CM

## 2023-06-06 DIAGNOSIS — C7A8 Other malignant neuroendocrine tumors: Secondary | ICD-10-CM

## 2023-06-06 LAB — CMP (CANCER CENTER ONLY)
ALT: 22 U/L (ref 0–44)
AST: 14 U/L — ABNORMAL LOW (ref 15–41)
Albumin: 3.9 g/dL (ref 3.5–5.0)
Alkaline Phosphatase: 82 U/L (ref 38–126)
Anion gap: 6 (ref 5–15)
BUN: 10 mg/dL (ref 6–20)
CO2: 30 mmol/L (ref 22–32)
Calcium: 9.4 mg/dL (ref 8.9–10.3)
Chloride: 105 mmol/L (ref 98–111)
Creatinine: 0.7 mg/dL (ref 0.44–1.00)
GFR, Estimated: >60 mL/min (ref >60)
Glucose, Bld: 86 mg/dL (ref 70–99)
Potassium: 3.6 mmol/L (ref 3.5–5.1)
Sodium: 141 mmol/L (ref 135–145)
Total Bilirubin: 0.4 mg/dL (ref <1.2)
Total Protein: 7.1 g/dL (ref 6.5–8.1)

## 2023-06-06 LAB — CBC WITH DIFFERENTIAL (CANCER CENTER ONLY)
Abs Immature Granulocytes: 0.02 10*3/uL (ref 0.00–0.07)
Basophils Absolute: 0 10*3/uL (ref 0.0–0.1)
Basophils Relative: 0 %
Eosinophils Absolute: 0.2 10*3/uL (ref 0.0–0.5)
Eosinophils Relative: 3 %
HCT: 34.8 % — ABNORMAL LOW (ref 36.0–46.0)
Hemoglobin: 11.2 g/dL — ABNORMAL LOW (ref 12.0–15.0)
Immature Granulocytes: 0 %
Lymphocytes Relative: 36 %
Lymphs Abs: 2.5 10*3/uL (ref 0.7–4.0)
MCH: 27.3 pg (ref 26.0–34.0)
MCHC: 32.2 g/dL (ref 30.0–36.0)
MCV: 84.7 fL (ref 80.0–100.0)
Monocytes Absolute: 0.9 10*3/uL (ref 0.1–1.0)
Monocytes Relative: 13 %
Neutro Abs: 3.4 10*3/uL (ref 1.7–7.7)
Neutrophils Relative %: 48 %
Platelet Count: 293 10*3/uL (ref 150–400)
RBC: 4.11 MIL/uL (ref 3.87–5.11)
RDW: 14.2 % (ref 11.5–15.5)
WBC Count: 6.9 10*3/uL (ref 4.0–10.5)
nRBC: 0 % (ref 0.0–0.2)

## 2023-06-06 MED ORDER — LANREOTIDE ACETATE 120 MG/0.5ML ~~LOC~~ SOLN
120.0000 mg | Freq: Once | SUBCUTANEOUS | Status: AC
  Administered 2023-06-06: 120 mg via SUBCUTANEOUS
  Filled 2023-06-06: qty 120

## 2023-06-08 LAB — CHROMOGRANIN A: Chromogranin A (ng/mL): 20.2 ng/mL (ref 0.0–101.8)

## 2023-06-27 ENCOUNTER — Inpatient Hospital Stay: Payer: 59

## 2023-06-27 ENCOUNTER — Inpatient Hospital Stay: Payer: 59 | Admitting: Hematology and Oncology

## 2023-07-03 NOTE — Progress Notes (Signed)
 [facility] [phone]   [phone]  PROGRESS NOTE  Patient Care Team: Norleen Lynwood ORN, MD as PCP - General  Hematological/Oncological History # Metastatic Neuroendocrine Tumor, Metastasis to the Spine 1) 08/26/2019: CT Abdomen Pelvis performed due to RUQ abdominal pain. Imaging showed a soft tissue mass abutting the right posterior eighth rib 2) 09/06/2019: CT chest performed which showed Ill-defined soft tissue nodule along the medial right posterior thoracic wall at the 8th intercostal space to the right of the spine. MRI was recommended. 3) 10/19/2019: MRI Thoracic spine showed enhancing lesions throughout the visualized spine consistent with metastatic disease. Additionally there was a 1.5 cm soft tissue nodule in the posteromedial right eighth intercostal space, more concerning for a metastasis 4) 10/25/2019: establish care with Dr. Federico   5) 11/05/2019: attempted biopsy of soft tissue mass at right posterior 8th rib, but lesion had dissipated at time of biopsy attempt 6) 11/27/2019: PET CT scan performed showing right inguinal lymph nodes and osseous lesions, indicative of metastatic disease of unknown primary. 7) 12/11/2019: CT bone marrow biopsy showed metastatic neuroendocrine neoplasm 8) 12/27/2019: start of lanreotide 120mg  sub q28 days  9) 10/15/2020: NM PET (CU-64) scan showed widespread osseous and lymphatic uptake consistent with metastatic spread of neuroendocrine tumor.  This is more avid than prior. 01/20/2021: clinic visit with Dr. Blinda due to concern for possible mantle cell lymphoma seen on pathology review of previous biopsy 04/15/2021: Dose 1 (of 4) of lutathera  treatment with nuclear medicine.  06/10/2021: Dose 2 (of 4) of lutathera  treatment with nuclear medicine.  08/05/2021: Dose 3 (of 4) of lutathera  treatment with nuclear medicine.  09/03/2021:Dose 4 (of 4) of lutathera  treatment with nuclear medicine.  11/26/2021: NM Copper  Dotatate Scan  shows dramatic reduction in number and activity of skeletal metastasis.   #Low Grade Neuroendocrine Tumor 1) 2006: reportedly had rectal carcinoid tumor removed 2) 08/14/2008: patient had a flexibile sigmoidoscopy with endoscopic ultrasound which resected an 8mm, subepithelial lesion in rectum. Findings consistent with low grade neuroendocrine tumor. 3) 04/08/2010: repeat colonoscopy, no residual tumor. Repeat recommended in 2016.  4) 12/11/2019: metastatic recurrence found on biopsy, noted above. 5) 11/26/2021:  NM PET CU-64 showed a dramatic reduction in the number and activity as skeletal metastases.  #Mantle Cell Lymphoma 11/24/2020: incidental findings of low level mantle cell lymphoma on bone marrow biopsy. Noted on outside review of pathology at [facility].  01/20/2021: clinic visit with Dr. Blinda due to concern for possible mantle cell lymphoma seen on pathology review of previous biopsy  # Meningioma  08/09/2022: MRI Brain ordered due to PET scan showing concern for meningioma. Scan showed dural-based mass at the right posterior frontal vertex, performed at [facility]  10/24/2022: 2nd opinion at [facility] 11/04/2022: MRI Brain showed meningioma measuring 3.5 x 3.5 x 2.1 cm.   Interval History:  Michelle Solomon 62 y.o. female with medical history significant for metastatic neuroendocrine tumor of the colon who presents for a follow up visit. The patient's last visit was on 04/05/2023. In the interim since the last visit she has had no major changes in her health.  On exam today Michelle Solomon reports he will unfortunately be working through the holiday.  She reports that she works at home so is not that bad.  She notes that she continues taking her lanreotide and is not having any difficulty with the medication.  She reports her appetite has been somewhat poor and sometimes she only eats about once per day with  some occasional snacking.  Fortunately her weight has only dropped about 2 pounds in  the interim since her last visit, though she is about 7 pounds down from July.  She reports her energy levels are good.  She notes that she has not had any recent infectious symptoms such as runny nose, sore throat, or cough.  She also reports that she has some occasional body aches and recently went on trip to [place]  where she walked so much her legs were like spaghetti.  She does that she did stop taking her iron  pills but now knowing that her hemoglobin is slightly lower she will begin taking it again.. Overall she is willing and able to proceed with lanreotide shots at this time.  She denies any fevers, chills, shortness of breath, chest pain, palpitations or cough. She has no other complaints.   A full 10 point ROS is listed below.  MEDICAL HISTORY:  Past Medical History:  Diagnosis Date   Acute bronchitis 06/06/2010   ALLERGIC RHINITIS 05/29/2007   Allergy    seasonal   Anal or rectal pain 06/23/2008   ANEMIA-IRON  DEFICIENCY 05/29/2007   Anxiety    ASTHMA 05/29/2007   Asthma    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   Cancer (HCC)    Neuro endocrine tumor   CONJUNCTIVITIS, ALLERGIC 11/27/2008   HEMORRHOIDS 06/16/2008   HTN (hypertension)    Hx of adenomatous colonic polyps 11/2019   NIPPLE DISCHARGE 01/21/2010   Other specified forms of hearing loss 10/08/2009   Overweight(278.02) 05/29/2007   RASH-NONVESICULAR 05/29/2007   Wheezing 06/22/2010    SURGICAL HISTORY: Past Surgical History:  Procedure Laterality Date   COLONOSCOPY      SOCIAL HISTORY: Social History   Socioeconomic History   Marital status: Widowed    Spouse name: Not on file   Number of children: 3   Years of education: Not on file   Highest education level: Not on file  Occupational History   Occupation: CARD REPLACEMEN    Employer: [employer]   Occupation: customer service  Tobacco Use   Smoking status: Some Days    Current packs/day: 0.00    Average packs/day: 0.5 packs/day for  40.0 years (20.0 ttl pk-yrs)    Types: Cigarettes    Start date: 10/22/1979    Last attempt to quit: 10/22/2019    Years since quitting: 3.7   Smokeless tobacco: Never   Tobacco comments:    1 pack weekly.  started smoking at 62 years old  Vaping Use   Vaping status: Never Used  Substance and Sexual Activity   Alcohol use: Yes    Comment: ocassional   Drug use: No   Sexual activity: Not Currently  Other Topics Concern   Not on file  Social History Narrative   Daily caffeine use 3   Patient does not get regular exercise   Social Drivers of Health   Financial Resource Strain: Not on file  Food Insecurity: Not on file  Transportation Needs: Not on file  Physical Activity: Not on file  Stress: Not on file  Social Connections: Not on file  Intimate Partner Violence: Not on file    FAMILY HISTORY: Family History  Problem Relation Age of Onset   Allergies Sister    Diabetes Sister    Hypertension Sister    Hypertension Mother    Prostate cancer Father    Hypertension Brother    Hypertension Sister    Diabetes Paternal Engineer, Mining  Cancer Paternal Aunt        type unknown   Prostate cancer Brother    Stroke Maternal Aunt    Hypertension Maternal Aunt    Colon cancer Neg Hx    Esophageal cancer Neg Hx    Rectal cancer Neg Hx    Stomach cancer Neg Hx     ALLERGIES:  is allergic to shellfish allergy.  MEDICATIONS:  Current Outpatient Medications  Medication Sig Dispense Refill   ferrous sulfate  325 (65 FE) MG tablet Take 1 tablet (325 mg total) by mouth daily with breakfast. Please take with a source of Vitamin C 90 tablet 3   buPROPion  (WELLBUTRIN  XL) 300 MG 24 hr tablet TAKE 1 TABLET(300 MG) BY MOUTH DAILY 90 tablet 3   cholecalciferol (VITAMIN D3) 25 MCG (1000 UNIT) tablet Take 1,000 Units by mouth daily.     GARLIC PO Take by mouth.     GINKGO BILOBA EXTRACT PO Take by mouth.     hydrochlorothiazide  (HYDRODIURIL ) 12.5 MG tablet TAKE 1 TABLET(12.5 MG) BY MOUTH DAILY  30 tablet 0   lanreotide acetate  (SOMATULINE DEPOT ) 120 MG/0.5ML injection Inject 120 mg into the skin every 28 (twenty-eight) days. 0.5 mL 12   loratadine (CLARITIN) 10 MG tablet Take 10 mg by mouth daily as needed for allergies.     losartan  (COZAAR ) 50 MG tablet TAKE 1 TABLET(50 MG) BY MOUTH DAILY 90 tablet 3   ondansetron  (ZOFRAN ) 8 MG tablet Take 1 tablet (8 mg total) by mouth 2 (two) times daily as needed for nausea or vomiting. (Patient not taking: Reported on 01/10/2023) 20 tablet 0   oxyCODONE -acetaminophen  (PERCOCET/ROXICET) 5-325 MG tablet Take 1 tablet by mouth every 6 (six) hours as needed for severe pain. (Patient not taking: Reported on 01/10/2023) 30 tablet 0   Turmeric 500 MG CAPS Take 500 mg by mouth daily.     vitamin B-12 (CYANOCOBALAMIN ) 100 MCG tablet Take 100 mcg by mouth daily.     VITAMIN D -VITAMIN K PO Take 1 tablet by mouth daily.     zolpidem  (AMBIEN ) 10 MG tablet Take 1 tablet (10 mg total) by mouth at bedtime as needed for sleep. 90 tablet 1   No current facility-administered medications for this visit.   Facility-Administered Medications Ordered in Other Visits  Medication Dose Route Frequency Provider Last Rate Last Admin   octreotide  (SANDOSTATIN  LAR) 30 MG IM injection             REVIEW OF SYSTEMS:   Constitutional: ( - ) fevers, ( - )  chills , ( - ) night sweats Eyes: ( - ) blurriness of vision, ( - ) double vision, ( - ) watery eyes Ears, nose, mouth, throat, and face: ( - ) mucositis, ( - ) sore throat Respiratory: ( - ) cough, ( - ) dyspnea, ( - ) wheezes Cardiovascular: ( - ) palpitation, ( - ) chest discomfort, ( - ) lower extremity swelling Gastrointestinal:  ( - ) nausea, ( - ) heartburn, ( - ) change in bowel habits Skin: ( - ) abnormal skin rashes Lymphatics: ( - ) new lymphadenopathy, ( - ) easy bruising Neurological: ( - ) numbness, ( - ) tingling, ( - ) new weaknesses Behavioral/Psych: ( - ) mood change, ( - ) new changes  All other systems  were reviewed with the patient and are negative.  PHYSICAL EXAMINATION: ECOG PERFORMANCE STATUS: 0 - Asymptomatic  Vitals:   07/04/23 0941  BP: 129/85  Pulse: 88  Resp: 16  Temp: 98.1 F (36.7 C)  SpO2: 100%        Filed Weights   07/04/23 0941  Weight: 176 lb 4.8 oz (80 kg)      GENERAL: well appearing middle aged African American female in NAD  SKIN: skin color, texture, turgor are normal, no rashes or significant lesions EYES: conjunctiva are pink and non-injected, sclera clear LUNGS: clear to auscultation and percussion with normal breathing effort HEART: regular rate & rhythm and no murmurs and no lower extremity edema Musculoskeletal: no cyanosis of digits and no clubbing  PSYCH: alert & oriented x 3, fluent speech NEURO: no focal motor/sensory deficits  LABORATORY DATA:  I have reviewed the data as listed    Latest Ref Rng & Units 07/04/2023    9:11 AM 06/06/2023    9:30 AM 05/02/2023   12:37 PM  CBC  WBC 4.0 - 10.5 K/uL 8.4  6.9  7.1   Hemoglobin 12.0 - 15.0 g/dL 89.0  88.7  87.5   Hematocrit 36.0 - 46.0 % 31.9  34.8  37.2   Platelets 150 - 400 K/uL 225  293  241        Latest Ref Rng & Units 07/04/2023    9:11 AM 06/06/2023    9:30 AM 05/02/2023   12:37 PM  CMP  Glucose 70 - 99 mg/dL 881  86  855   BUN 8 - 23 mg/dL 12  10  9    Creatinine 0.44 - 1.00 mg/dL 9.26  9.29  9.22   Sodium 135 - 145 mmol/L 139  141  137   Potassium 3.5 - 5.1 mmol/L 3.6  3.6  4.2   Chloride 98 - 111 mmol/L 105  105  100   CO2 22 - 32 mmol/L 27  30  31    Calcium 8.9 - 10.3 mg/dL 9.0  9.4  9.8   Total Protein 6.5 - 8.1 g/dL 6.8  7.1  7.9   Total Bilirubin 0.0 - 1.2 mg/dL 0.3  0.4  0.6   Alkaline Phos 38 - 126 U/L 77  82  80   AST 15 - 41 U/L 14  14  26    ALT 0 - 44 U/L 13  22  18      RADIOGRAPHIC STUDIES: I have personally reviewed the radiological images as listed and agreed with the findings in the report.  No results found.   ASSESSMENT & PLAN Michelle Solomon  62 y.o. female with medical history significant for metastatic neuroendocrine tumor of the colon who presents for a follow up visit.    # Metastatic Neuroendocrine Tumor, Metastasis to the Spine  --previously discussed with patient that the options would include observation with serial imaging or starting lanreotide therapy. The patient opted to start treatment --will plan for continued lanreotide 120mg  subq q28 days. Started therapy on 12/27/2019. This will be continued until progression or intolerance to therapy. Next dose due on 02/19/2021 --Due to the extensive involvement of her skeleton and increased activity on her last nuclear medicine scan it was recommended that she be considered for Lu 177 dotatate treatment. Initiated treatment on 04/15/2021 with plans to complete a total of 4 treatments.  --CU-64 scan q 3 months. Last in April 2022. Completed PET CT scan at [facility] on 02/02/2021, no evidence of lymphoma.  --Patient expressed interest in a second opinion.  She saw Dr. Markham at [facility].  He provided excellent advice regarding options moving forward.  After hearing  all the different options the patient noted she would like to continue her current lanreotide shots with consideration of increased frequency if she were to be found to be progressing --patient underwent treatment with Lutathera  therapy. She is s/p treatment 4 of 4. They administered 30 mg IM long acting sandostatin  on days where she undergoes Lutathera  treatment (q 8 weeks x 4 doses). Completed the 4th Lutathera  treatment on 09/30/21  Plan:  --continue lanreotide 120 mg q 28 days with q 3 month zometa  --Last Zometa  infusion completed on 04/05/2023. Next dose in Jan 2025.  --Labs today show white blood cell count 8.4, hemoglobin 10.9, MCV 82.2, platelets 225. Creatinine and LFTs normal. Chromogranin levels pending today. Last chromogranin level from 06/06/2023 was normal at 20.2. --Last PET dotatate scan did show  concern for progression with new lung nodule and some new areas of FDG avidity, however the underlying sclerotic lesions were stable.  Will order repeat scan earlier in order to reassess. Order placed for early Jan 2025.  --RTC in 12 weeks with interval continued monthly lanreotide injections.   #Mantle Cell Lymphoma -- Incidental finding from prior biopsy results.  This was detected on secondary pathological review at [facility] when the patient went for second opinion --Unclear if this represents a small low-grade clinically insignificant population of mantle cell lymphoma or a more concerning issue --Patient following with Dr. Blinda at [facility] --PET CT scan on 02/02/2021 showed no evidence of lymphoma.  --We will continue to monitor and appreciate the guidance of Dr. Blinda  #Possible Meningioma: --Seen on MRI brain  from 08/09/2022 at [facility], measuring 3.6 cm along the right frontal convexity with broad-based dural attachment.  --Evaluated by Dr. Prentice Beckers from [facility] who recommended surgical resection.  --Patient seen for a second opinion at [facility] on 10/30/2022 --current plan for repeat scan in Oct/Nov 2024  Orders Placed This Encounter  Procedures   NM PET DOTATATE SKULL BASE TO MID THIGH    Standing Status:   Future    Expected Date:   07/12/2023    Expiration Date:   07/04/2024    If indicated for the ordered procedure, I authorize the administration of a radiopharmaceutical per Radiology protocol:   Yes    Preferred imaging location?:   [place]   CBC with Differential (Cancer Center Only)    Standing Status:   Future    Expiration Date:   07/03/2024   CMP (Cancer Center only)    Standing Status:   Future    Expiration Date:   07/03/2024   Ferritin    Standing Status:   Future    Expiration Date:   07/03/2024   Iron  and Iron  Binding Capacity ([facility] only)    Standing Status:   Future    Expiration Date:   07/03/2024   Retic  Panel    Standing Status:   Future    Expiration Date:   07/03/2024   All questions were answered. The patient knows to call the clinic with any problems, questions or concerns.  I have spent a total of 30 minutes minutes of face-to-face and non-face-to-face time, preparing to see the patient,performing a medically appropriate examination, counseling and educating the patient, documenting clinical information in the electronic health record, and care coordination.   Norleen IVAR Kidney, MD Department of Hematology/Oncology [facility] Phone: [phone] Pager: [phone] Email: [email or web address]    07/05/2023 3:46 PM

## 2023-07-04 ENCOUNTER — Inpatient Hospital Stay (HOSPITAL_BASED_OUTPATIENT_CLINIC_OR_DEPARTMENT_OTHER): Payer: 59 | Admitting: Hematology and Oncology

## 2023-07-04 ENCOUNTER — Inpatient Hospital Stay: Payer: 59

## 2023-07-04 ENCOUNTER — Other Ambulatory Visit: Payer: Self-pay | Admitting: *Deleted

## 2023-07-04 VITALS — BP 129/85 | HR 88 | Temp 98.1°F | Resp 16 | Wt 176.3 lb

## 2023-07-04 DIAGNOSIS — C7B8 Other secondary neuroendocrine tumors: Secondary | ICD-10-CM

## 2023-07-04 DIAGNOSIS — C7A8 Other malignant neuroendocrine tumors: Secondary | ICD-10-CM

## 2023-07-04 DIAGNOSIS — C7A025 Malignant carcinoid tumor of the sigmoid colon: Secondary | ICD-10-CM | POA: Diagnosis not present

## 2023-07-04 LAB — CMP (CANCER CENTER ONLY)
ALT: 13 U/L (ref 0–44)
AST: 14 U/L — ABNORMAL LOW (ref 15–41)
Albumin: 3.8 g/dL (ref 3.5–5.0)
Alkaline Phosphatase: 77 U/L (ref 38–126)
Anion gap: 7 (ref 5–15)
BUN: 12 mg/dL (ref 8–23)
CO2: 27 mmol/L (ref 22–32)
Calcium: 9 mg/dL (ref 8.9–10.3)
Chloride: 105 mmol/L (ref 98–111)
Creatinine: 0.73 mg/dL (ref 0.44–1.00)
GFR, Estimated: >60 mL/min (ref >60)
Glucose, Bld: 118 mg/dL — ABNORMAL HIGH (ref 70–99)
Potassium: 3.6 mmol/L (ref 3.5–5.1)
Sodium: 139 mmol/L (ref 135–145)
Total Bilirubin: 0.3 mg/dL (ref 0.0–1.2)
Total Protein: 6.8 g/dL (ref 6.5–8.1)

## 2023-07-04 LAB — CBC WITH DIFFERENTIAL (CANCER CENTER ONLY)
Abs Immature Granulocytes: 0.02 10*3/uL (ref 0.00–0.07)
Basophils Absolute: 0 10*3/uL (ref 0.0–0.1)
Basophils Relative: 0 %
Eosinophils Absolute: 0.1 10*3/uL (ref 0.0–0.5)
Eosinophils Relative: 1 %
HCT: 31.9 % — ABNORMAL LOW (ref 36.0–46.0)
Hemoglobin: 10.9 g/dL — ABNORMAL LOW (ref 12.0–15.0)
Immature Granulocytes: 0 %
Lymphocytes Relative: 39 %
Lymphs Abs: 3.3 10*3/uL (ref 0.7–4.0)
MCH: 28.1 pg (ref 26.0–34.0)
MCHC: 34.2 g/dL (ref 30.0–36.0)
MCV: 82.2 fL (ref 80.0–100.0)
Monocytes Absolute: 0.7 10*3/uL (ref 0.1–1.0)
Monocytes Relative: 9 %
Neutro Abs: 4.2 10*3/uL (ref 1.7–7.7)
Neutrophils Relative %: 51 %
Platelet Count: 225 10*3/uL (ref 150–400)
RBC: 3.88 MIL/uL (ref 3.87–5.11)
RDW: 14.4 % (ref 11.5–15.5)
WBC Count: 8.4 10*3/uL (ref 4.0–10.5)
nRBC: 0 % (ref 0.0–0.2)

## 2023-07-04 MED ORDER — LANREOTIDE ACETATE 120 MG/0.5ML ~~LOC~~ SOLN
120.0000 mg | Freq: Once | SUBCUTANEOUS | Status: AC
Start: 2023-07-04 — End: 2023-07-04
  Administered 2023-07-04: 120 mg via SUBCUTANEOUS
  Filled 2023-07-04: qty 120

## 2023-07-04 MED ORDER — FERROUS SULFATE 325 (65 FE) MG PO TABS: 325.0000 mg | ORAL_TABLET | Freq: Every day | ORAL | 3 refills | Status: DC

## 2023-07-05 ENCOUNTER — Encounter: Payer: Self-pay | Admitting: Hematology and Oncology

## 2023-07-06 ENCOUNTER — Other Ambulatory Visit: Payer: Self-pay | Admitting: *Deleted

## 2023-07-06 LAB — CHROMOGRANIN A: Chromogranin A (ng/mL): 25.6 ng/mL (ref 0.0–101.8)

## 2023-07-06 MED ORDER — FERROUS SULFATE 325 (65 FE) MG PO TABS: 325.0000 mg | ORAL_TABLET | Freq: Every day | ORAL | 3 refills | Status: AC

## 2023-07-08 ENCOUNTER — Other Ambulatory Visit: Payer: Self-pay | Admitting: Internal Medicine

## 2023-07-08 DIAGNOSIS — F419 Anxiety disorder, unspecified: Secondary | ICD-10-CM

## 2023-07-25 ENCOUNTER — Inpatient Hospital Stay: Payer: 59

## 2023-07-31 ENCOUNTER — Encounter (HOSPITAL_COMMUNITY)
Admission: RE | Admit: 2023-07-31 | Discharge: 2023-07-31 | Disposition: A | Discharge status: Home / Self Care | Payer: 59 | Source: Ambulatory Visit | Attending: Hematology and Oncology | Admitting: Hematology and Oncology

## 2023-07-31 DIAGNOSIS — C7A8 Other malignant neuroendocrine tumors: Secondary | ICD-10-CM | POA: Diagnosis present

## 2023-07-31 DIAGNOSIS — C7B8 Other secondary neuroendocrine tumors: Secondary | ICD-10-CM | POA: Insufficient documentation

## 2023-07-31 MED ORDER — COPPER CU 64 DOTATATE 1 MCI/ML IV SOLN
4.0000 | Freq: Once | INTRAVENOUS | Status: AC
  Administered 2023-07-31: 4.19 via INTRAVENOUS

## 2023-08-01 ENCOUNTER — Inpatient Hospital Stay: Payer: 59

## 2023-08-01 ENCOUNTER — Inpatient Hospital Stay: Payer: 59 | Attending: Hematology and Oncology

## 2023-08-01 VITALS — BP 156/109 | HR 83 | Temp 98.0°F | Resp 18

## 2023-08-01 DIAGNOSIS — C7B8 Other secondary neuroendocrine tumors: Secondary | ICD-10-CM

## 2023-08-01 DIAGNOSIS — C7A8 Other malignant neuroendocrine tumors: Secondary | ICD-10-CM

## 2023-08-01 DIAGNOSIS — C7B03 Secondary carcinoid tumors of bone: Secondary | ICD-10-CM | POA: Insufficient documentation

## 2023-08-01 DIAGNOSIS — C7A025 Malignant carcinoid tumor of the sigmoid colon: Secondary | ICD-10-CM | POA: Insufficient documentation

## 2023-08-01 LAB — CMP (CANCER CENTER ONLY)
ALT: 17 U/L (ref 0–44)
AST: 17 U/L (ref 15–41)
Albumin: 3.8 g/dL (ref 3.5–5.0)
Alkaline Phosphatase: 74 U/L (ref 38–126)
Anion gap: 6 (ref 5–15)
BUN: 11 mg/dL (ref 8–23)
CO2: 28 mmol/L (ref 22–32)
Calcium: 9.2 mg/dL (ref 8.9–10.3)
Chloride: 105 mmol/L (ref 98–111)
Creatinine: 0.71 mg/dL (ref 0.44–1.00)
GFR, Estimated: >60 mL/min (ref >60)
Glucose, Bld: 140 mg/dL — ABNORMAL HIGH (ref 70–99)
Potassium: 3.9 mmol/L (ref 3.5–5.1)
Sodium: 139 mmol/L (ref 135–145)
Total Bilirubin: 0.3 mg/dL (ref 0.0–1.2)
Total Protein: 7.1 g/dL (ref 6.5–8.1)

## 2023-08-01 LAB — FERRITIN: Ferritin: 172 ng/mL (ref 11–307)

## 2023-08-01 LAB — CBC WITH DIFFERENTIAL (CANCER CENTER ONLY)
Abs Immature Granulocytes: 0.05 10*3/uL (ref 0.00–0.07)
Basophils Absolute: 0 10*3/uL (ref 0.0–0.1)
Basophils Relative: 0 %
Eosinophils Absolute: 0.2 10*3/uL (ref 0.0–0.5)
Eosinophils Relative: 2 %
HCT: 33 % — ABNORMAL LOW (ref 36.0–46.0)
Hemoglobin: 10.6 g/dL — ABNORMAL LOW (ref 12.0–15.0)
Immature Granulocytes: 1 %
Lymphocytes Relative: 43 %
Lymphs Abs: 3.3 10*3/uL (ref 0.7–4.0)
MCH: 27.1 pg (ref 26.0–34.0)
MCHC: 32.1 g/dL (ref 30.0–36.0)
MCV: 84.4 fL (ref 80.0–100.0)
Monocytes Absolute: 0.7 10*3/uL (ref 0.1–1.0)
Monocytes Relative: 9 %
Neutro Abs: 3.5 10*3/uL (ref 1.7–7.7)
Neutrophils Relative %: 45 %
Platelet Count: 338 10*3/uL (ref 150–400)
RBC: 3.91 MIL/uL (ref 3.87–5.11)
RDW: 14.4 % (ref 11.5–15.5)
WBC Count: 7.8 10*3/uL (ref 4.0–10.5)
nRBC: 0 % (ref 0.0–0.2)

## 2023-08-01 LAB — RETIC PANEL
Immature Retic Fract: 15.7 % (ref 2.3–15.9)
RBC.: 3.87 MIL/uL (ref 3.87–5.11)
Retic Count, Absolute: 38.7 10*3/uL (ref 19.0–186.0)
Retic Ct Pct: 1 % (ref 0.4–3.1)
Reticulocyte Hemoglobin: 31.2 pg (ref >27.9)

## 2023-08-01 LAB — IRON AND IRON BINDING CAPACITY (CC-WL,HP ONLY)
Iron: 72 ug/dL (ref 28–170)
Saturation Ratios: 24 % (ref 10.4–31.8)
TIBC: 304 ug/dL (ref 250–450)
UIBC: 232 ug/dL (ref 148–442)

## 2023-08-01 MED ORDER — LANREOTIDE ACETATE 120 MG/0.5ML ~~LOC~~ SOLN
120.0000 mg | Freq: Once | SUBCUTANEOUS | Status: AC
  Administered 2023-08-01: 120 mg via SUBCUTANEOUS
  Filled 2023-08-01: qty 120

## 2023-08-01 NOTE — Progress Notes (Signed)
Pt. Here for injection.  Blood pressure elevated.  Advised to see PCP

## 2023-08-03 ENCOUNTER — Telehealth: Payer: Self-pay | Admitting: Hematology and Oncology

## 2023-08-03 NOTE — Telephone Encounter (Signed)
Scheduled appointments per 1/29 scheduling message. Patient is aware of the made appointments and is active on MyChart.

## 2023-08-08 ENCOUNTER — Inpatient Hospital Stay: Payer: 59 | Attending: Hematology and Oncology | Admitting: Nurse Practitioner

## 2023-08-08 ENCOUNTER — Other Ambulatory Visit: Payer: Self-pay | Admitting: Hematology and Oncology

## 2023-08-08 ENCOUNTER — Telehealth: Payer: Self-pay | Admitting: Physician Assistant

## 2023-08-08 ENCOUNTER — Inpatient Hospital Stay: Payer: 59

## 2023-08-08 VITALS — BP 138/90 | HR 88 | Temp 97.6°F | Resp 18 | Wt 175.0 lb

## 2023-08-08 DIAGNOSIS — C7B8 Other secondary neuroendocrine tumors: Secondary | ICD-10-CM

## 2023-08-08 DIAGNOSIS — M51379 Other intervertebral disc degeneration, lumbosacral region without mention of lumbar back pain or lower extremity pain: Secondary | ICD-10-CM | POA: Insufficient documentation

## 2023-08-08 DIAGNOSIS — I10 Essential (primary) hypertension: Secondary | ICD-10-CM | POA: Diagnosis not present

## 2023-08-08 DIAGNOSIS — Z860101 Personal history of adenomatous and serrated colon polyps: Secondary | ICD-10-CM | POA: Insufficient documentation

## 2023-08-08 DIAGNOSIS — J45909 Unspecified asthma, uncomplicated: Secondary | ICD-10-CM | POA: Diagnosis not present

## 2023-08-08 DIAGNOSIS — R0781 Pleurodynia: Secondary | ICD-10-CM | POA: Insufficient documentation

## 2023-08-08 DIAGNOSIS — Z79899 Other long term (current) drug therapy: Secondary | ICD-10-CM | POA: Insufficient documentation

## 2023-08-08 DIAGNOSIS — F17210 Nicotine dependence, cigarettes, uncomplicated: Secondary | ICD-10-CM | POA: Insufficient documentation

## 2023-08-08 DIAGNOSIS — C7A025 Malignant carcinoid tumor of the sigmoid colon: Secondary | ICD-10-CM | POA: Insufficient documentation

## 2023-08-08 DIAGNOSIS — K5900 Constipation, unspecified: Secondary | ICD-10-CM | POA: Diagnosis not present

## 2023-08-08 DIAGNOSIS — R232 Flushing: Secondary | ICD-10-CM | POA: Insufficient documentation

## 2023-08-08 DIAGNOSIS — C7B03 Secondary carcinoid tumors of bone: Secondary | ICD-10-CM | POA: Diagnosis not present

## 2023-08-08 DIAGNOSIS — M5136 Other intervertebral disc degeneration, lumbar region with discogenic back pain only: Secondary | ICD-10-CM | POA: Diagnosis not present

## 2023-08-08 DIAGNOSIS — C7A8 Other malignant neuroendocrine tumors: Secondary | ICD-10-CM

## 2023-08-08 NOTE — Telephone Encounter (Signed)
 [name] Kitchen

## 2023-08-08 NOTE — Progress Notes (Signed)
 Patient Care Team: Norleen Lynwood ORN, MD as PCP - General  Date of Service: 08/08/2023  CHIEF COMPLAINT: Follow-up neuroendocrine tumor and mantle cell lymphoma  CURRENT THERAPY: Monthly lanreotide injections for metastatic NET, last given 08/01/23 and zometa  q12 weeks  INTERVAL HISTORY Michelle Solomon returns for follow-up with her daughter.  Last seen by Dr. Federico 07/04/2023 and continues monthly lanreotide which she tolerates well.  She has regular bowel movement every 2 days, only has abdominal cramping if she takes something to have a BM.  She has occasional hot flash/sweat.  A month ago she had pain in the right hip that radiated down her leg but this has essentially resolved, no other significant or chronic pains.  ROS  All other systems reviewed and negative  Past Medical History:  Diagnosis Date   Acute bronchitis 06/06/2010   ALLERGIC RHINITIS 05/29/2007   Allergy    seasonal   Anal or rectal pain 06/23/2008   ANEMIA-IRON  DEFICIENCY 05/29/2007   Anxiety    ASTHMA 05/29/2007   Asthma    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   Cancer (HCC)    Neuro endocrine tumor   CONJUNCTIVITIS, ALLERGIC 11/27/2008   HEMORRHOIDS 06/16/2008   HTN (hypertension)    Hx of adenomatous colonic polyps 11/2019   NIPPLE DISCHARGE 01/21/2010   Other specified forms of hearing loss 10/08/2009   Overweight(278.02) 05/29/2007   RASH-NONVESICULAR 05/29/2007   Wheezing 06/22/2010     Past Surgical History:  Procedure Laterality Date   COLONOSCOPY       Outpatient Encounter Medications as of 08/08/2023  Medication Sig Note   buPROPion  (WELLBUTRIN  XL) 300 MG 24 hr tablet TAKE 1 TABLET(300 MG) BY MOUTH DAILY    cholecalciferol (VITAMIN D3) 25 MCG (1000 UNIT) tablet Take 1,000 Units by mouth daily.    ferrous sulfate  325 (65 FE) MG tablet Take 1 tablet (325 mg total) by mouth daily with breakfast. Please take with a source of Vitamin C    GARLIC PO Take by mouth. 01/10/2023: Use: supplement    GINKGO BILOBA EXTRACT PO Take by mouth. 01/10/2023: Use: supplement   hydrochlorothiazide  (HYDRODIURIL ) 12.5 MG tablet TAKE 1 TABLET(12.5 MG) BY MOUTH DAILY    lanreotide acetate  (SOMATULINE DEPOT ) 120 MG/0.5ML injection Inject 120 mg into the skin every 28 (twenty-eight) days.    loratadine (CLARITIN) 10 MG tablet Take 10 mg by mouth daily as needed for allergies.    losartan  (COZAAR ) 50 MG tablet TAKE 1 TABLET(50 MG) BY MOUTH DAILY    ondansetron  (ZOFRAN ) 8 MG tablet Take 1 tablet (8 mg total) by mouth 2 (two) times daily as needed for nausea or vomiting.    oxyCODONE -acetaminophen  (PERCOCET/ROXICET) 5-325 MG tablet Take 1 tablet by mouth every 6 (six) hours as needed for severe pain.    Turmeric 500 MG CAPS Take 500 mg by mouth daily.    vitamin B-12 (CYANOCOBALAMIN ) 100 MCG tablet Take 100 mcg by mouth daily.    VITAMIN D -VITAMIN K PO Take 1 tablet by mouth daily.    zolpidem  (AMBIEN ) 10 MG tablet Take 1 tablet (10 mg total) by mouth at bedtime as needed for sleep.    Facility-Administered Encounter Medications as of 08/08/2023  Medication   octreotide  (SANDOSTATIN  LAR) 30 MG IM injection     Today's Vitals   08/08/23 1042 08/08/23 1048  BP: (!) 151/100 (!) 138/90  Pulse: 88   Resp: 18   Temp: 97.6 F (36.4 C)   TempSrc: Temporal  SpO2: 100%   Weight: 175 lb (79.4 kg)   PainSc:  0-No pain   Body mass index is 31 kg/m.   PHYSICAL EXAM GENERAL:alert, no distress and comfortable SKIN: no rash  EYES: sclera clear NECK: without mass LYMPH:  no palpable cervical or supraclavicular lymphadenopathy  LUNGS: clear with normal breathing effort HEART: regular rate & rhythm, no lower extremity edema ABDOMEN: abdomen soft, non-tender and normal bowel sounds NEURO: alert & oriented x 3 with fluent speech, no focal motor/sensory deficits    CBC    Component Value Date/Time   WBC 7.8 08/01/2023 0923   WBC 6.5 09/30/2021 0804   RBC 3.87 08/01/2023 0923   RBC 3.91 08/01/2023 0923    HGB 10.6 (L) 08/01/2023 0923   HCT 33.0 (L) 08/01/2023 0923   PLT 338 08/01/2023 0923   MCV 84.4 08/01/2023 0923   MCH 27.1 08/01/2023 0923   MCHC 32.1 08/01/2023 0923   RDW 14.4 08/01/2023 0923   LYMPHSABS 3.3 08/01/2023 0923   MONOABS 0.7 08/01/2023 0923   EOSABS 0.2 08/01/2023 0923   BASOSABS 0.0 08/01/2023 0923     CMP     Component Value Date/Time   NA 139 08/01/2023 0923   K 3.9 08/01/2023 0923   CL 105 08/01/2023 0923   CO2 28 08/01/2023 0923   GLUCOSE 140 (H) 08/01/2023 0923   BUN 11 08/01/2023 0923   CREATININE 0.71 08/01/2023 0923   CALCIUM 9.2 08/01/2023 0923   PROT 7.1 08/01/2023 0923   ALBUMIN 3.8 08/01/2023 0923   AST 17 08/01/2023 0923   ALT 17 08/01/2023 0923   ALKPHOS 74 08/01/2023 0923   BILITOT 0.3 08/01/2023 0923   GFRNONAA >60 08/01/2023 0923   GFRAA >60 04/01/2020 1004     ASSESSMENT & PLAN: 63 yo female   Metastatic neuroendocrine tumor -Initial diagnosis 2006 with rectal carcinoid tumor that was removed -Recurrence: 08/14/08 flex sig with 8 mm subepithelial lesion in the rectum, removed via endoscopic ultrasound -12/11/2019 metastatic recurrence via bone marrow biopsy; bone lesions avid on dotatate pet scans -12/27/19 started first line lanreotide inj q28 days -10/2020 progression of disease  -Second line lutathera  04/15/21 - 09/03/21 x 4 treatments  -11/26/21 PET showed dramatic response  -Continues monthly lanreotide and q3 month zometa   -Michelle Solomon appears stable. Tolerating lanreotide injections. She has mildly increased rib pain but overall tolerable. Otherwise asymptomatic of NET -I reviewed her PET scan independently and discussed it with Dr. Federico. Unfortunately she has progression of skeletal mets and 2 new liver lesions. -Given the liver lesions are hypermetabolic on dotatate PET, we are not recommending a liver biopsy at this time. -Dr. Federico recommends to change therapy to third line everolimus. We discussed potential risk/benefit, side  effects, and symptom management -We also discussed re-treatment with lutathera  given her good response with initial treatment  -Will arrange phone visit for her to discuss with Dr. Federico [place] Cell Lymphoma  -11/24/20 incidental findings of low level mantle cell lymphoma on bone marrow biopsy -Followed by Dr. Blinda at [facility]    PLAN: -Restaging PET and recent labs reviewed -Stop lanreotide due to disease progression -Discussed case with Dr. Federico who recommends next line Everolimus. I reviewed this with pt, also reviewed re-treatment with lutathera  vs everolimus, pt interested in multidisciplinary collaboration and would like to discuss with Dr. Federico. -Phone visit with Dr. Federico to discuss treatment, will discuss in tumor board if needed  -General discourse and a flyer in the room lead  pt to inquire about palliative care, she is interested in the service and requested a referral to palliative care and nutritionist  -F/up pending treatment discussion with Dr. Federico    Orders Placed This Encounter  Procedures   Amb Referral to Palliative Care    Referral Priority:   Routine    Referral Type:   Consultation    Number of Visits Requested:   1   Ambulatory Referral to [facility]    Referral Priority:   Routine    Referral Type:   Consultation    Referral Reason:   Specialty Services Required    Number of Visits Requested:   1      All questions were answered. The patient knows to call the clinic with any problems, questions or concerns. No barriers to learning were detected. I spent 20 minutes counseling the patient face to face. The total time spent in the appointment was 30 minutes and more than 50% was on counseling, review of test results, and coordination of care.   Kateri Balch, NP-C 08/14/2023

## 2023-08-10 ENCOUNTER — Telehealth: Payer: Self-pay | Admitting: Nurse Practitioner

## 2023-08-10 NOTE — Telephone Encounter (Signed)
 Scheduled appointment per 2/4 los. Patient is aware of the made appointment.

## 2023-08-14 ENCOUNTER — Encounter: Payer: Self-pay | Admitting: Nurse Practitioner

## 2023-08-14 ENCOUNTER — Encounter: Payer: Self-pay | Admitting: Hematology and Oncology

## 2023-08-15 ENCOUNTER — Inpatient Hospital Stay (HOSPITAL_BASED_OUTPATIENT_CLINIC_OR_DEPARTMENT_OTHER): Payer: 59 | Admitting: Hematology and Oncology

## 2023-08-15 ENCOUNTER — Telehealth: Payer: Self-pay | Admitting: Internal Medicine

## 2023-08-15 DIAGNOSIS — C7B8 Other secondary neuroendocrine tumors: Secondary | ICD-10-CM

## 2023-08-15 DIAGNOSIS — C7A8 Other malignant neuroendocrine tumors: Secondary | ICD-10-CM | POA: Diagnosis not present

## 2023-08-15 DIAGNOSIS — M899 Disorder of bone, unspecified: Secondary | ICD-10-CM | POA: Diagnosis not present

## 2023-08-15 NOTE — Progress Notes (Signed)
Hematology/Oncology Progress Note  Clinical Summary: Michelle Solomon 63 y.o. female with medical history significant for metastatic neuroendocrine tumor of the colon who presents for a follow up visit.   Interval History: Today we had a telephone visit with Michelle Solomon who is accompanied by her sister.  She reports that she has been well overall and since her last visit.  We discussed the findings of progression on her scan and the options moving forward.  The 2 treatment options would include a repeat treatment of Lutathera versus transitioning to everolimus.  We discussed the risks and benefits of both treatment and the patient and she would like to consider these options.  We noted that we can touch base again next week in order to discuss next steps.  In the event she would like to pursue Lutathera will make referral to nuclear medicine, in the event that she wants to pursue everolimus we will order 10 mg p.o. daily called into the [employer].  O:  There were no vitals filed for this visit.    Latest Ref Rng & Units 08/01/2023    9:23 AM 07/04/2023    9:11 AM 06/06/2023    9:30 AM  CMP  Glucose 70 - 99 mg/dL 119  147  86   BUN 8 - 23 mg/dL 11  12  10    Creatinine 0.44 - 1.00 mg/dL 8.29  5.62  1.30   Sodium 135 - 145 mmol/L 139  139  141   Potassium 3.5 - 5.1 mmol/L 3.9  3.6  3.6   Chloride 98 - 111 mmol/L 105  105  105   CO2 22 - 32 mmol/L 28  27  30    Calcium 8.9 - 10.3 mg/dL 9.2  9.0  9.4   Total Protein 6.5 - 8.1 g/dL 7.1  6.8  7.1   Total Bilirubin 0.0 - 1.2 mg/dL 0.3  0.3  0.4   Alkaline Phos 38 - 126 U/L 74  77  82   AST 15 - 41 U/L 17  14  14    ALT 0 - 44 U/L 17  13  22        Latest Ref Rng & Units 08/01/2023    9:23 AM 07/04/2023    9:11 AM 06/06/2023    9:30 AM  CBC  WBC 4.0 - 10.5 K/uL 7.8  8.4  6.9   Hemoglobin 12.0 - 15.0 g/dL 86.5  78.4  69.6   Hematocrit 36.0 - 46.0 % 33.0  31.9  34.8   Platelets 150 - 400 K/uL 338  225  293       Telephone  Visit: no physical exam   Assessment/Plan:  Michelle Solomon 63 y.o. female with medical history significant for metastatic neuroendocrine tumor of the colon who presents for a follow up visit.     # Metastatic Neuroendocrine Tumor, Metastasis to the Spine  --previously discussed with patient that the options would include observation with serial imaging or starting lanreotide therapy. The patient opted to start treatment --will plan for continued lanreotide 120mg  subq q28 days. Started therapy on 12/27/2019. This will be continued until progression or intolerance to therapy. Next dose due on 02/19/2021 --Due to the extensive involvement of her skeleton and increased activity on her last nuclear medicine scan it was recommended that she be considered for Lu 177 dotatate treatment. Initiated treatment on 04/15/2021 with plans to complete a total of 4 treatments.  --CU-64 scan q 3 months. Last in April 2022.  Completed PET CT scan at [facility] on 02/02/2021, no evidence of lymphoma.  --Patient expressed interest in a second opinion.  She saw Dr. Lattie Corns at [facility].  He provided excellent advice regarding options moving forward.  After hearing all the different options the patient noted she would like to continue her current lanreotide shots with consideration of increased frequency if she were to be found to be progressing --patient underwent treatment with Lutathera therapy. She is s/p treatment 4 of 4. They administered 30 mg IM long acting sandostatin on days where she undergoes Lutathera treatment (q 8 weeks x 4 doses). Completed the 4th Lutathera treatment on 09/30/21  Plan:  --HOLD lanreotide 120 mg q 28 days with q 3 month zometa --Labs today show white blood cell count 7.8, Hgb 1.6, MCV 84.4, Plt 338. Creatinine and LFTs normal. Chromogranin levels pending today. Last chromogranin level from 07/04/2023 was normal at 25.6 --Last PET dotatate scan did show concern for progression  with new liver nodule and some new areas of FDG avidity in the bone.   --Offered treatment with everolimus 10 mg p.o. daily versus Lutathera.  The patient would like time to consider this treatment option. --RTC pending the patient's decision for treatment.   Ulysees Barns, MD Department of Hematology/Oncology [facility] at [facility] Phone: [phone] Pager: [phone] Email: [email or web address]

## 2023-08-15 NOTE — Telephone Encounter (Signed)
Scheduled appointments per referral. Patient is aware of the appointment time and date as well as the address. Patient was informed to arrive 10-15 minutes prior with updated insurance information. All questions were answered.

## 2023-08-16 ENCOUNTER — Emergency Department (HOSPITAL_COMMUNITY): Payer: 59

## 2023-08-16 ENCOUNTER — Other Ambulatory Visit: Payer: Self-pay

## 2023-08-16 ENCOUNTER — Observation Stay (HOSPITAL_COMMUNITY)
Admission: EM | Admit: 2023-08-16 | Discharge: 2023-08-18 | Disposition: A | Discharge status: Home / Self Care | Payer: 59 | Attending: Internal Medicine | Admitting: Internal Medicine

## 2023-08-16 DIAGNOSIS — C7B8 Other secondary neuroendocrine tumors: Secondary | ICD-10-CM | POA: Insufficient documentation

## 2023-08-16 DIAGNOSIS — K56609 Unspecified intestinal obstruction, unspecified as to partial versus complete obstruction: Principal | ICD-10-CM

## 2023-08-16 DIAGNOSIS — K644 Residual hemorrhoidal skin tags: Secondary | ICD-10-CM | POA: Diagnosis not present

## 2023-08-16 DIAGNOSIS — D175 Benign lipomatous neoplasm of intra-abdominal organs: Secondary | ICD-10-CM | POA: Insufficient documentation

## 2023-08-16 DIAGNOSIS — I10 Essential (primary) hypertension: Secondary | ICD-10-CM | POA: Diagnosis not present

## 2023-08-16 DIAGNOSIS — D123 Benign neoplasm of transverse colon: Secondary | ICD-10-CM

## 2023-08-16 DIAGNOSIS — Z8589 Personal history of malignant neoplasm of other organs and systems: Secondary | ICD-10-CM | POA: Diagnosis not present

## 2023-08-16 DIAGNOSIS — K56699 Other intestinal obstruction unspecified as to partial versus complete obstruction: Secondary | ICD-10-CM | POA: Diagnosis not present

## 2023-08-16 DIAGNOSIS — K648 Other hemorrhoids: Secondary | ICD-10-CM | POA: Diagnosis not present

## 2023-08-16 DIAGNOSIS — E669 Obesity, unspecified: Secondary | ICD-10-CM | POA: Diagnosis not present

## 2023-08-16 DIAGNOSIS — R933 Abnormal findings on diagnostic imaging of other parts of digestive tract: Secondary | ICD-10-CM | POA: Diagnosis not present

## 2023-08-16 DIAGNOSIS — K5730 Diverticulosis of large intestine without perforation or abscess without bleeding: Secondary | ICD-10-CM | POA: Diagnosis not present

## 2023-08-16 DIAGNOSIS — F17210 Nicotine dependence, cigarettes, uncomplicated: Secondary | ICD-10-CM | POA: Diagnosis not present

## 2023-08-16 DIAGNOSIS — Z79899 Other long term (current) drug therapy: Secondary | ICD-10-CM | POA: Diagnosis not present

## 2023-08-16 DIAGNOSIS — R109 Unspecified abdominal pain: Secondary | ICD-10-CM | POA: Diagnosis present

## 2023-08-16 DIAGNOSIS — Z716 Tobacco abuse counseling: Secondary | ICD-10-CM

## 2023-08-16 DIAGNOSIS — J45909 Unspecified asthma, uncomplicated: Secondary | ICD-10-CM | POA: Insufficient documentation

## 2023-08-16 DIAGNOSIS — K5900 Constipation, unspecified: Secondary | ICD-10-CM | POA: Diagnosis not present

## 2023-08-16 DIAGNOSIS — C7A8 Other malignant neuroendocrine tumors: Secondary | ICD-10-CM | POA: Insufficient documentation

## 2023-08-16 DIAGNOSIS — K56600 Partial intestinal obstruction, unspecified as to cause: Secondary | ICD-10-CM | POA: Diagnosis not present

## 2023-08-16 LAB — URINALYSIS, ROUTINE W REFLEX MICROSCOPIC
Bilirubin Urine: NEGATIVE
Glucose, UA: NEGATIVE mg/dL
Ketones, ur: 20 mg/dL — AB
Leukocytes,Ua: NEGATIVE
Nitrite: NEGATIVE
Protein, ur: 30 mg/dL — AB
Specific Gravity, Urine: 1.011 (ref 1.005–1.030)
pH: 7 (ref 5.0–8.0)

## 2023-08-16 LAB — COMPREHENSIVE METABOLIC PANEL
ALT: 30 U/L (ref 0–44)
AST: 31 U/L (ref 15–41)
Albumin: 3.8 g/dL (ref 3.5–5.0)
Alkaline Phosphatase: 93 U/L (ref 38–126)
Anion gap: 10 (ref 5–15)
BUN: 9 mg/dL (ref 8–23)
CO2: 25 mmol/L (ref 22–32)
Calcium: 9.1 mg/dL (ref 8.9–10.3)
Chloride: 101 mmol/L (ref 98–111)
Creatinine, Ser: 0.6 mg/dL (ref 0.44–1.00)
GFR, Estimated: >60 mL/min (ref >60)
Glucose, Bld: 145 mg/dL — ABNORMAL HIGH (ref 70–99)
Potassium: 3.9 mmol/L (ref 3.5–5.1)
Sodium: 136 mmol/L (ref 135–145)
Total Bilirubin: 0.8 mg/dL (ref 0.0–1.2)
Total Protein: 8.1 g/dL (ref 6.5–8.1)

## 2023-08-16 LAB — CBC WITH DIFFERENTIAL/PLATELET
Abs Immature Granulocytes: 0.1 10*3/uL — ABNORMAL HIGH (ref 0.00–0.07)
Basophils Absolute: 0 10*3/uL (ref 0.0–0.1)
Basophils Relative: 0 %
Eosinophils Absolute: 0 10*3/uL (ref 0.0–0.5)
Eosinophils Relative: 0 %
HCT: 38.3 % (ref 36.0–46.0)
Hemoglobin: 11.9 g/dL — ABNORMAL LOW (ref 12.0–15.0)
Immature Granulocytes: 1 %
Lymphocytes Relative: 20 %
Lymphs Abs: 2.3 10*3/uL (ref 0.7–4.0)
MCH: 26.6 pg (ref 26.0–34.0)
MCHC: 31.1 g/dL (ref 30.0–36.0)
MCV: 85.5 fL (ref 80.0–100.0)
Monocytes Absolute: 1.2 10*3/uL — ABNORMAL HIGH (ref 0.1–1.0)
Monocytes Relative: 11 %
Neutro Abs: 7.9 10*3/uL — ABNORMAL HIGH (ref 1.7–7.7)
Neutrophils Relative %: 68 %
Platelets: 217 10*3/uL (ref 150–400)
RBC: 4.48 MIL/uL (ref 3.87–5.11)
RDW: 14.6 % (ref 11.5–15.5)
WBC: 11.6 10*3/uL — ABNORMAL HIGH (ref 4.0–10.5)
nRBC: 0 % (ref 0.0–0.2)

## 2023-08-16 LAB — LIPASE, BLOOD: Lipase: 20 U/L (ref 11–51)

## 2023-08-16 MED ORDER — BUPROPION HCL ER (XL) 300 MG PO TB24
300.0000 mg | ORAL_TABLET | Freq: Every day | ORAL | Status: DC
  Administered 2023-08-17 – 2023-08-18 (×2): 300 mg via ORAL
  Filled 2023-08-16: qty 1
  Filled 2023-08-16: qty 2

## 2023-08-16 MED ORDER — HYDROMORPHONE HCL 1 MG/ML IJ SOLN
1.0000 mg | Freq: Once | INTRAMUSCULAR | Status: AC
  Administered 2023-08-16: 1 mg via INTRAVENOUS
  Filled 2023-08-16: qty 1

## 2023-08-16 MED ORDER — SENNA 8.6 MG PO TABS
2.0000 | ORAL_TABLET | Freq: Two times a day (BID) | ORAL | Status: DC
  Administered 2023-08-16 – 2023-08-17 (×3): 17.2 mg via ORAL
  Filled 2023-08-16 (×3): qty 2

## 2023-08-16 MED ORDER — HYDROMORPHONE HCL 1 MG/ML IJ SOLN
0.5000 mg | Freq: Once | INTRAMUSCULAR | Status: AC
  Administered 2023-08-16: 0.5 mg via INTRAVENOUS
  Filled 2023-08-16: qty 1

## 2023-08-16 MED ORDER — GADOBUTROL 1 MMOL/ML IV SOLN
8.0000 mL | Freq: Once | INTRAVENOUS | Status: AC | PRN
  Administered 2023-08-16: 8 mL via INTRAVENOUS

## 2023-08-16 MED ORDER — SODIUM CHLORIDE 0.9 % IV BOLUS
500.0000 mL | Freq: Once | INTRAVENOUS | Status: AC
  Administered 2023-08-16: 500 mL via INTRAVENOUS

## 2023-08-16 MED ORDER — MINERAL OIL RE ENEM
1.0000 | ENEMA | Freq: Once | RECTAL | Status: AC
  Administered 2023-08-16: 1 via RECTAL
  Filled 2023-08-16: qty 1

## 2023-08-16 MED ORDER — IOHEXOL 300 MG/ML  SOLN
100.0000 mL | Freq: Once | INTRAMUSCULAR | Status: AC | PRN
  Administered 2023-08-16: 100 mL via INTRAVENOUS

## 2023-08-16 MED ORDER — BISACODYL 10 MG RE SUPP: 10.0000 mg | Freq: Every day | RECTAL | Status: DC | PRN

## 2023-08-16 MED ORDER — POLYETHYLENE GLYCOL 3350 17 G PO PACK
17.0000 g | PACK | Freq: Two times a day (BID) | ORAL | Status: DC
Start: 2023-08-16 — End: 2023-08-18
  Administered 2023-08-16 – 2023-08-17 (×3): 17 g via ORAL
  Filled 2023-08-16 (×3): qty 1

## 2023-08-16 NOTE — ED Triage Notes (Signed)
Pt reports lower back pain and lower abdominal pain. No bm in 3 days, states not passing flatus either. Also thinks she may have a UTI.

## 2023-08-16 NOTE — ED Provider Notes (Signed)
  Physical Exam  BP 133/89   Pulse 68   Temp 98.8 F (37.1 C) (Oral)   Resp 17   Ht 5\' 3"  (1.6 m)   Wt 79.4 kg   LMP 10/26/2017   SpO2 97%   BMI 31.00 kg/m   Physical Exam  Procedures  Procedures  ED Course / MDM    Medical Decision Making Care assumed at 4 PM.  Patient has a history of metastatic neuroendocrine tumor.  Patient is here with constipation and back pain.  Signout pending CT abdomen pelvis and MRI.  8:43 PM CT showed fecal distention of the colon and colonic obstruction.  Patient is a patient of Dr. Leone Payor.  I discussed case with Dr. Chales Abrahams from [facility].  He recommend admission and he will see patient tomorrow for colonoscopy.  MRI of the thoracic lumbar spine showed diffuse osseous metastatic disease with no obvious cord compression.  At this point patient will be admitted for colonic obstruction secondary to stricture versus mass  Problems Addressed: Colonic obstruction [facility]): acute illness or injury  Amount and/or Complexity of Data Reviewed Radiology: ordered and independent interpretation performed. Decision-making details documented in ED Course.  Risk Prescription drug management. Decision regarding hospitalization.   CRITICAL CARE Performed by: Richardean Canal   Total critical care time: 37 minutes  Critical care time was exclusive of separately billable procedures and treating other patients.  Critical care was necessary to treat or prevent imminent or life-threatening deterioration.  Critical care was time spent personally by me on the following activities: development of treatment plan with patient and/or surrogate as well as nursing, discussions with consultants, evaluation of patient's response to treatment, examination of patient, obtaining history from patient or surrogate, ordering and performing treatments and interventions, ordering and review of laboratory studies, ordering and review of radiographic studies, pulse oximetry and  re-evaluation of patient's condition.        Charlynne Pander, MD 08/16/23 2045

## 2023-08-16 NOTE — ED Provider Notes (Signed)
[facility] EMERGENCY DEPARTMENT AT [facility] Provider Note   CSN: [id number] Arrival date & time: 08/16/23  1213     History  Chief Complaint  Patient presents with   Abdominal Pain   Back Pain    Michelle Solomon is a 63 y.o. female.   Abdominal Pain Back Pain Associated symptoms: abdominal pain   Patient presents with abdominal pain and back pain.  Today is Wednesday and states around 3 days ago began to have more pain.  Pain in the back and then went to abdomen.  No weakness.  Has had constipation for around 5 days now.  Patient states she normally goes every other day.  No dysuria.  Has a neuroendocrine tumor that is metastatic to the the bone also.  Last imaging was in December.    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 Authorizing Provider  buPROPion (WELLBUTRIN XL) 300 MG 24 hr tablet TAKE 1 TABLET(300 MG) BY MOUTH DAILY 04/13/22   Corwin Levins, MD  cholecalciferol (VITAMIN D3) 25 MCG (1000 UNIT) tablet Take 1,000 Units by mouth daily.    [provider]  ferrous sulfate 325 (65 FE) MG tablet Take 1 tablet (325 mg total) by mouth daily with breakfast. Please take with a source of Vitamin C 07/06/23   Jaci Standard, MD  GARLIC PO Take by mouth. 10/26/22   [provider]   Edrick Oh BILOBA EXTRACT PO Take by mouth. 10/26/22   [provider]  hydrochlorothiazide (HYDRODIURIL) 12.5 MG tablet TAKE 1 TABLET(12.5 MG) BY MOUTH DAILY 05/10/23   Corwin Levins, MD  lanreotide acetate (SOMATULINE DEPOT) 120 MG/0.5ML injection Inject 120 mg into the skin every 28 (twenty-eight) days. 07/06/21   Briant Cedar, PA-C  loratadine (CLARITIN) 10 MG tablet Take 10 mg by mouth daily as needed for allergies.    [provider]  losartan (COZAAR) 50 MG tablet TAKE 1 TABLET(50 MG) BY MOUTH DAILY 04/24/23   Corwin Levins, MD  ondansetron (ZOFRAN) 8 MG tablet Take 1 tablet (8 mg total) by mouth 2 (two) times daily as needed for nausea or vomiting. 06/10/21   Lorna Few, MD  oxyCODONE-acetaminophen (PERCOCET/ROXICET) 5-325 MG tablet Take 1 tablet by mouth every 6 (six) hours as needed for severe pain. 04/13/22   Corwin Levins, MD  Turmeric 500 MG CAPS Take 500 mg by mouth daily.    [provider]  vitamin B-12 (CYANOCOBALAMIN) 100 MCG tablet Take 100 mcg by mouth daily.    [provider]  VITAMIN D-VITAMIN K PO Take 1 tablet by mouth daily.    [provider]  zolpidem (AMBIEN) 10 MG tablet Take 1 tablet (10 mg total) by mouth at bedtime as needed for sleep. 04/13/22  05/13/22  Corwin Levins, MD      Allergies    Shellfish allergy    Review of Systems   Review of Systems  Gastrointestinal:  Positive for abdominal pain.  Musculoskeletal:  Positive for back pain.    Physical Exam Updated Vital Signs BP (!) 164/113   Pulse (!) 105   Temp 98.5 F (36.9 C) (Oral)   Resp 16   Ht 5\' 3"  (1.6 m)   Wt 79.4 kg   LMP 10/26/2017   SpO2 99%   BMI 31.00 kg/m  Physical Exam Vitals and nursing note reviewed.  Cardiovascular:     Rate and Rhythm: Tachycardia present.  Abdominal:     Tenderness: There is abdominal tenderness.     Comments: Left lower quadrant tenderness without rebound or guarding.  No hernia palpated.  Neurological:      General: No focal deficit present.     Mental Status: She is alert.   Some tenderness over lower back, not necessarily on midline however.  ED Results / Procedures / Treatments   Labs (all labs ordered are listed, but only abnormal results are displayed) Labs Reviewed  CBC WITH DIFFERENTIAL/PLATELET - Abnormal; Notable for the following components:      Result Value   WBC 11.6 (*)    Hemoglobin 11.9 (*)    Neutro Abs 7.9 (*)    Monocytes Absolute 1.2 (*)    Abs Immature Granulocytes 0.10 (*)    All other components within normal limits  COMPREHENSIVE METABOLIC PANEL - Abnormal; Notable for the following components:   Glucose, Bld 145 (*)    All other components within normal limits  URINALYSIS, ROUTINE W REFLEX MICROSCOPIC - Abnormal; Notable for the following components:   Hgb urine dipstick SMALL (*)    Ketones, ur 20 (*)    Protein, ur 30 (*)    Bacteria, UA RARE (*)    All other components within normal limits  CULTURE, BLOOD (ROUTINE X 2)  CULTURE, BLOOD (ROUTINE X 2)  LIPASE, BLOOD    EKG None  Radiology No results found.  Procedures Procedures    Medications Ordered in ED Medications  HYDROmorphone (DILAUDID) injection 1 mg (has no administration in time range)  sodium chloride 0.9 % bolus 500 mL (500 mLs Intravenous New Bag/Given 08/16/23 1440)  iohexol (OMNIPAQUE) 300 MG/ML solution 100 mL (100 mLs Intravenous Contrast Given 08/16/23 1549)  HYDROmorphone (DILAUDID) injection 0.5 mg (0.5 mg Intravenous Given 08/16/23 1540)    ED Course/ Medical Decision Making/ A&P                                 Medical Decision Making Amount and/or Complexity of Data Reviewed Radiology: ordered.  Risk Prescription drug management.   Patient's abdominal pain.  Constipation.  Also back pain.  High risk features to include metastatic cancer.  Reviewed recent oncology note.  No vomiting.  Does have tenderness in the abdomen.  Causes to include cancer, however causes  such as diverticulitis also considered.  Will get basic blood work and CT scan to begin with.  Has taken laxatives without relief.  If negative CT scan may require MRI.  Care turned over to Dr. Silverio Lay.        Final Clinical Impression(s) / ED Diagnoses Final diagnoses:  None    Rx / DC Orders ED Discharge Orders     None  Benjiman Core, MD 08/16/23 1659

## 2023-08-16 NOTE — ED Provider Triage Note (Signed)
Emergency Medicine Provider Triage Evaluation Note  Michelle Solomon , a 63 y.o. female  was evaluated in triage.  Pt complains of lower abdominal pain ongoing for past 3 days.  Also reports that she had not had a bowel movement in the past 3 days.  Usually goes every other day.  She thinks she may have a UTI.  Reports she is undergoing treatment for a neuroendocrine tumor that is also has mets to the liver.  She is getting monthly injections, last one was at the end of January  Review of Systems  Positive: As above Negative: As above  Physical Exam  BP (!) 172/117 (BP Location: Left Arm)   Pulse (!) 119   Temp 98.5 F (36.9 C) (Oral)   Resp 16   Ht 5\' 3"  (1.6 m)   Wt 79.4 kg   LMP 10/26/2017   SpO2 99%   BMI 31.00 kg/m  Gen:   Awake, no distress   Resp:  Normal effort  MSK:   Moves extremities without difficulty  Other:  No abdominal tenderness palpation  Tachycardic on cardiac auscultation  Medical Decision Making  Medically screening exam initiated at 12:33 PM.  Appropriate orders placed.  Michelle Solomon was informed that the remainder of the evaluation will be completed by another provider, this initial triage assessment does not replace that evaluation, and the importance of remaining in the ED until their evaluation is complete.     Arabella Merles, PA-C 08/16/23 1233

## 2023-08-17 ENCOUNTER — Inpatient Hospital Stay (HOSPITAL_COMMUNITY): Payer: 59

## 2023-08-17 ENCOUNTER — Ambulatory Visit: Payer: 59 | Admitting: Nurse Practitioner

## 2023-08-17 ENCOUNTER — Encounter (HOSPITAL_COMMUNITY): Payer: Self-pay | Admitting: Internal Medicine

## 2023-08-17 DIAGNOSIS — R933 Abnormal findings on diagnostic imaging of other parts of digestive tract: Secondary | ICD-10-CM | POA: Diagnosis not present

## 2023-08-17 DIAGNOSIS — K56699 Other intestinal obstruction unspecified as to partial versus complete obstruction: Secondary | ICD-10-CM | POA: Diagnosis not present

## 2023-08-17 DIAGNOSIS — Z716 Tobacco abuse counseling: Secondary | ICD-10-CM

## 2023-08-17 DIAGNOSIS — K5900 Constipation, unspecified: Secondary | ICD-10-CM | POA: Diagnosis not present

## 2023-08-17 DIAGNOSIS — C7A8 Other malignant neuroendocrine tumors: Secondary | ICD-10-CM | POA: Insufficient documentation

## 2023-08-17 LAB — CBC
HCT: 35.4 % — ABNORMAL LOW (ref 36.0–46.0)
Hemoglobin: 10.9 g/dL — ABNORMAL LOW (ref 12.0–15.0)
MCH: 26.6 pg (ref 26.0–34.0)
MCHC: 30.8 g/dL (ref 30.0–36.0)
MCV: 86.3 fL (ref 80.0–100.0)
Platelets: 206 10*3/uL (ref 150–400)
RBC: 4.1 MIL/uL (ref 3.87–5.11)
RDW: 14.7 % (ref 11.5–15.5)
WBC: 9.4 10*3/uL (ref 4.0–10.5)
nRBC: 0 % (ref 0.0–0.2)

## 2023-08-17 LAB — BASIC METABOLIC PANEL
Anion gap: 10 (ref 5–15)
BUN: 9 mg/dL (ref 8–23)
CO2: 25 mmol/L (ref 22–32)
Calcium: 9 mg/dL (ref 8.9–10.3)
Chloride: 102 mmol/L (ref 98–111)
Creatinine, Ser: 0.43 mg/dL — ABNORMAL LOW (ref 0.44–1.00)
GFR, Estimated: >60 mL/min (ref >60)
Glucose, Bld: 134 mg/dL — ABNORMAL HIGH (ref 70–99)
Potassium: 3.8 mmol/L (ref 3.5–5.1)
Sodium: 137 mmol/L (ref 135–145)

## 2023-08-17 LAB — PROTIME-INR
INR: 1.1 (ref 0.8–1.2)
Prothrombin Time: 14 s (ref 11.4–15.2)

## 2023-08-17 LAB — PHOSPHORUS: Phosphorus: 2.4 mg/dL — ABNORMAL LOW (ref 2.5–4.6)

## 2023-08-17 LAB — HIV ANTIBODY (ROUTINE TESTING W REFLEX): HIV Screen 4th Generation wRfx: NONREACTIVE

## 2023-08-17 LAB — MAGNESIUM: Magnesium: 2.1 mg/dL (ref 1.7–2.4)

## 2023-08-17 MED ORDER — PEG-KCL-NACL-NASULF-NA ASC-C 100 G PO SOLR
0.5000 | Freq: Two times a day (BID) | ORAL | Status: AC
  Administered 2023-08-17 – 2023-08-18 (×2): 100 g via ORAL
  Filled 2023-08-17: qty 1

## 2023-08-17 MED ORDER — HYDROMORPHONE HCL 1 MG/ML IJ SOLN
0.5000 mg | INTRAMUSCULAR | Status: DC | PRN
  Administered 2023-08-17 – 2023-08-18 (×4): 0.5 mg via INTRAVENOUS
  Filled 2023-08-17: qty 1
  Filled 2023-08-17 (×2): qty 0.5
  Filled 2023-08-17: qty 1

## 2023-08-17 MED ORDER — PEG 3350-KCL-NA BICARB-NACL 420 G PO SOLR
4000.0000 mL | Freq: Once | ORAL | Status: AC
  Administered 2023-08-17: 4000 mL via ORAL
  Filled 2023-08-17: qty 4000

## 2023-08-17 MED ORDER — LOSARTAN POTASSIUM 25 MG PO TABS
25.0000 mg | ORAL_TABLET | Freq: Every day | ORAL | Status: DC
  Administered 2023-08-17 – 2023-08-18 (×2): 25 mg via ORAL
  Filled 2023-08-17 (×2): qty 1

## 2023-08-17 MED ORDER — NICOTINE POLACRILEX 2 MG MT GUM
2.0000 mg | CHEWING_GUM | OROMUCOSAL | Status: DC | PRN
  Filled 2023-08-17: qty 1

## 2023-08-17 MED ORDER — OXYCODONE HCL 5 MG PO TABS: 2.5000 mg | ORAL_TABLET | ORAL | Status: DC | PRN

## 2023-08-17 MED ORDER — POTASSIUM PHOSPHATES 15 MMOLE/5ML IV SOLN
15.0000 mmol | Freq: Once | INTRAVENOUS | Status: AC
  Administered 2023-08-17: 15 mmol via INTRAVENOUS
  Filled 2023-08-17: qty 5

## 2023-08-17 MED ORDER — OXYCODONE HCL 5 MG PO TABS
5.0000 mg | ORAL_TABLET | ORAL | Status: DC | PRN
  Administered 2023-08-17 (×3): 5 mg via ORAL
  Filled 2023-08-17 (×3): qty 1

## 2023-08-17 MED ORDER — SODIUM CHLORIDE 0.9 % IV SOLN: INTRAVENOUS | Status: AC

## 2023-08-17 MED ORDER — ONDANSETRON HCL 4 MG/2ML IJ SOLN: 4.0000 mg | Freq: Four times a day (QID) | INTRAMUSCULAR | Status: DC | PRN

## 2023-08-17 MED ORDER — ACETAMINOPHEN 500 MG PO TABS
1000.0000 mg | ORAL_TABLET | Freq: Four times a day (QID) | ORAL | Status: DC | PRN
Start: 2023-08-17 — End: 2023-08-18
  Administered 2023-08-17: 1000 mg via ORAL
  Filled 2023-08-17: qty 2

## 2023-08-17 MED ORDER — SMOG ENEMA
960.0000 mL | Freq: Once | RECTAL | Status: AC
  Administered 2023-08-17: 960 mL via RECTAL
  Filled 2023-08-17: qty 960

## 2023-08-17 NOTE — Progress Notes (Addendum)
PROGRESS NOTE  Michelle Solomon ZOX:[id number] DOB: [birth year] DOA: 08/16/2023 PCP: Corwin Levins, MD   LOS: 1 day   Brief Narrative / Interim history: 63 year old female with history of metastatic neuroendocrine tumor with mets to the bone, lymph nodes, liver, HTN, comes into the hospital with lower abdominal discomfort, lack of bowel movements over the last several days along with nausea.  She also has been having flank pain.  Prior to these her bowel movements were normal, she denies any blood in her stool or melena.  Last colonoscopy was 2021 with polyps that were resected.  Workup in the ED was concerning for chronic obstruction in the descending colon possible stricture or mass.  GI was consulted, and she was admitted to the hospital.  Subjective / 24h Interval events: She is doing well this morning, reports some abdominal discomfort, no nausea or vomiting.  Has been passing gas but no BMs yet.  Her sister is at bedside.  Assesement and Plan: Principal Problem:   Colonic stricture [facility]) Active Problems:   Neuroendocrine carcinoma metastatic to multiple sites [facility])   Encounter for smoking cessation counseling  Principal problem Partial large bowel obstruction, descending colon -CT scan raising concern for colonic stricture versus mass.  She has a history of a neuroendocrine tumor and had colon surgery 20+ years ago.  Gastroenterology consulted on admission, appreciate input.  May need surgical evaluation, will await GI opinion first -Continue supportive care for now, she has no significant nausea, vomiting  Active problems Stage IV neuroendocrine tumor -with metastasis to the bone, lymph nodes, liver.  Looks like liver mets seem to be new.  Currently getting treatment and follows with Dr. Leonides Schanz with oncology as an outpatient.  Imaging in the ED showed worsening bony metastasis and as above new onset liver mets  Essential hypertension-resume home dose losartan, blood pressure on the  high side  Hypophosphatemia-replace phosphorus  History of asthma-no wheezing, this is stable  History of mood disorder-continue Wellbutrin  Tobacco use-2 cigarettes a day.  She is trying to quit  Obesity, class I-noted  Scheduled Meds:  buPROPion  300 mg Oral Daily   polyethylene glycol  17 g Oral BID   senna  2 tablet Oral BID   Continuous Infusions:  sodium chloride 75 mL/hr at 08/17/23 0639   PRN Meds:.acetaminophen, bisacodyl, HYDROmorphone (DILAUDID) injection, nicotine polacrilex, ondansetron (ZOFRAN) IV, oxyCODONE **OR** oxyCODONE  Current Outpatient Medications  Medication Instructions   buPROPion (WELLBUTRIN XL) 300 MG 24 hr tablet TAKE 1 TABLET(300 MG) BY MOUTH DAILY   cholecalciferol (VITAMIN D3) 1,000 Units, Daily   ferrous sulfate 325 mg, Oral, Daily with breakfast, Please take with a source of Vitamin C   GARLIC PO 1 tablet, Oral, Daily   GINKGO BILOBA EXTRACT PO 1 tablet, Oral, Daily   hydrochlorothiazide (HYDRODIURIL) 12.5 MG tablet TAKE 1 TABLET(12.5 MG) BY MOUTH DAILY   lanreotide acetate (SOMATULINE DEPOT) 120 mg, Subcutaneous, Every 28 days   loratadine (CLARITIN) 10 mg, Daily PRN   losartan (COZAAR) 50 MG tablet TAKE 1 TABLET(50 MG) BY MOUTH DAILY   ondansetron (ZOFRAN) 8 mg, Oral, 2 times daily PRN   oxyCODONE-acetaminophen (PERCOCET/ROXICET) 5-325 MG tablet 1 tablet, Oral, Every 6 hours PRN   Turmeric 500 mg, Daily   VITAMIN D-VITAMIN K PO 1 tablet, Daily   zolpidem (AMBIEN) 10 mg, Oral, At bedtime PRN    Diet Orders (From admission, onward)     Start     Ordered   08/16/23 2117  Diet  clear liquid Room service appropriate? Yes; Fluid consistency: Thin  Diet effective now       Question Answer Comment  Room service appropriate? Yes   Fluid consistency: Thin      08/16/23 2117            DVT prophylaxis: SCDs Start: 08/16/23 2117   Lab Results  Component Value Date   PLT 206 08/17/2023      Code Status: Full Code  Family  Communication: Sister present at bedside  Status is: Inpatient Remains inpatient appropriate because: Large intestinal obstruction  Level of care: Med-Surg  Consultants:  Gastroenterology  Objective: Vitals:   08/17/23 0234 08/17/23 0330 08/17/23 0535 08/17/23 0720  BP: (!) 159/135 (!) 128/96 (!) 154/87 (!) 160/99  Pulse: (!) 108  94 92  Resp: 18  18 16   Temp: 99.2 F (37.3 C)   98.6 F (37 C)  TempSrc: Oral   Oral  SpO2: 98%  97% 100%  Weight:      Height:        Intake/Output Summary (Last 24 hours) at 08/17/2023 0956 Last data filed at 08/16/2023 1741 Gross per 24 hour  Intake 500 ml  Output --  Net 500 ml   Wt Readings from Last 3 Encounters:  08/16/23 79.4 kg  08/08/23 79.4 kg  07/04/23 80 kg    Examination:  Constitutional: NAD Eyes: no scleral icterus ENMT: Mucous membranes are moist.  Neck: normal, supple Respiratory: clear to auscultation bilaterally, no wheezing, no crackles. Normal respiratory effort.  Cardiovascular: Regular rate and rhythm, no murmurs / rubs / gallops. No LE edema.  Abdomen: non distended, no tenderness. Bowel sounds positive.  Musculoskeletal: no clubbing / cyanosis.    Data Reviewed: I have independently reviewed following labs and imaging studies   CBC Recent Labs  Lab 08/16/23 1355 08/17/23 0640  WBC 11.6* 9.4  HGB 11.9* 10.9*  HCT 38.3 35.4*  PLT 217 206  MCV 85.5 86.3  MCH 26.6 26.6  MCHC 31.1 30.8  RDW 14.6 14.7  LYMPHSABS 2.3  --   MONOABS 1.2*  --   EOSABS 0.0  --   BASOSABS 0.0  --     Recent Labs  Lab 08/16/23 1355 08/17/23 0640  NA 136 137  K 3.9 3.8  CL 101 102  CO2 25 25  GLUCOSE 145* 134*  BUN 9 9  CREATININE 0.60 0.43*  CALCIUM 9.1 9.0  AST 31  --   ALT 30  --   ALKPHOS 93  --   BILITOT 0.8  --   ALBUMIN 3.8  --   MG  --  2.1  INR  --  1.1    ------------------------------------------------------------------------------------------------------------------ No results for input(s):  "CHOL", "HDL", "LDLCALC", "TRIG", "CHOLHDL", "LDLDIRECT" in the last 72 hours.  Lab Results  Component Value Date   HGBA1C 6.7 (H) 04/13/2022   ------------------------------------------------------------------------------------------------------------------ No results for input(s): "TSH", "T4TOTAL", "T3FREE", "THYROIDAB" in the last 72 hours.  Invalid input(s): "FREET3"  Cardiac Enzymes No results for input(s): "CKMB", "TROPONINI", "MYOGLOBIN" in the last 168 hours.  Invalid input(s): "CK" ------------------------------------------------------------------------------------------------------------------ No results found for: "BNP"  CBG: No results for input(s): "GLUCAP" in the last 168 hours.  Recent Results (from the past 240 hours)  Blood culture (routine x 2)     Status: None (Preliminary result)   Collection Time: 08/16/23  1:55 PM   Specimen: BLOOD  Result Value Ref Range Status   Specimen Description   Final    BLOOD SITE  NOT SPECIFIED Performed at [facility], [address], [place], [place] [address]    Special Requests   Final    BOTTLES DRAWN AEROBIC AND ANAEROBIC Blood Culture adequate volume Performed at [facility], [address], [place], [place] [address]    Culture   Final    NO GROWTH < 24 HOURS Performed at [facility], [address] [place], [place] [address]    Report Status PENDING  Incomplete     Radiology Studies: DG Abd 2 Views Result Date: 08/17/2023 CLINICAL DATA:  63 year old female with history of colonic stricture. EXAM: ABDOMEN - 2 VIEW COMPARISON:  No priors. FINDINGS: The bowel gas pattern is normal. There is no evidence of free air. No radio-opaque calculi or other significant radiographic abnormality is seen. IMPRESSION: Negative. Electronically Signed   By: Trudie Reed M.D.   On: 08/17/2023 06:20   MR THORACIC SPINE W WO CONTRAST Result Date: 08/16/2023 CLINICAL DATA:  Metastatic  neuroendocrine tumor EXAM: MRI THORACIC WITHOUT AND WITH CONTRAST TECHNIQUE: Multiplanar and multiecho pulse sequences of the thoracic spine were obtained without and with intravenous contrast. CONTRAST:  8mL GADAVIST GADOBUTROL 1 MMOL/ML IV SOLN COMPARISON:  MRI 10/18/2019 FINDINGS: Alignment:  No malalignment. Vertebrae: No regional fracture. Marked worsening of widespread metastatic disease throughout the bones of the region including the spine and ribs. No pathologic fracture is seen. No visible extraosseous extension of tumor. Cord:  No primary cord lesion. Paraspinal and other soft tissues: Multiple T2 bright lesions seen throughout the liver. Disc levels: No disc herniation. There is marked facet and ligamentous hypertrophy at the T9-10 level, right more than left, with encroachment upon the spinal canal but no actual compression of the cord. IMPRESSION: 1. Marked worsening of widespread metastatic disease throughout the bones of the region including the spine and ribs. No pathologic fracture is seen. No visible extraosseous extension of tumor. 2. Multiple T2 bright lesions throughout the liver, consistent with metastatic disease. 3. Marked facet and ligamentous hypertrophy at the T9-10 level, right more than left, with encroachment upon the spinal canal but no actual compression of the cord. Electronically Signed   By: Paulina Fusi M.D.   On: 08/16/2023 20:28   MR Lumbar Spine W Wo Contrast Result Date: 08/16/2023 CLINICAL DATA:  Low back pain. Cancer suspected. History of neuroendocrine tumor. EXAM: MRI LUMBAR SPINE WITHOUT AND WITH CONTRAST TECHNIQUE: Multiplanar and multiecho pulse sequences of the lumbar spine were obtained without and with intravenous contrast. CONTRAST:  8mL GADAVIST GADOBUTROL 1 MMOL/ML IV SOLN COMPARISON:  CT abdomen same day.  PET scan 07/31/2023. FINDINGS: Segmentation:  5 lumbar type vertebral bodies. Alignment:  Normal Vertebrae: No evidence regional fracture. The study shows  complete marrow replacement by tumor throughout the skeletal system. There is no evidence of acute pathologic fracture. No extraosseous tumor extension is identified. Conus medullaris and cauda equina: Conus extends to the L1 level. Conus and cauda equina appear normal. Paraspinal and other soft tissues: Negative Disc levels: No significant disc level finding at L3-4 or above. L4-5: Mild bulging of the disc. Mild facet and ligamentous hypertrophy. No compressive stenosis. L5-S1: Moderate bulging of the disc. Mild facet and ligamentous hypertrophy. No compressive stenosis. IMPRESSION: 1. Widespread osseous metastatic disease throughout the regional osseous structures. No evidence of acute pathologic fracture. No extraosseous tumor extension. 2. L4-5: Mild bulging of the disc. Mild facet and ligamentous hypertrophy. No compressive stenosis. 3. L5-S1: Moderate bulging of the disc. Mild facet  and ligamentous hypertrophy. No compressive stenosis. Electronically Signed   By: Paulina Fusi M.D.   On: 08/16/2023 20:21   CT ABDOMEN PELVIS W CONTRAST Result Date: 08/16/2023 CLINICAL DATA:  No bowel movements EXAM: CT ABDOMEN AND PELVIS WITH CONTRAST TECHNIQUE: Multidetector CT imaging of the abdomen and pelvis was performed using the standard protocol following bolus administration of intravenous contrast. RADIATION DOSE REDUCTION: This exam was performed according to the departmental dose-optimization program which includes automated exposure control, adjustment of the mA and/or kV according to patient size and/or use of iterative reconstruction technique. CONTRAST:  OMNIPAQUE IOHEXOL 300 MG/ML  SOLN COMPARISON:  CT 08/26/2019, PET CT 07/31/2023, 06/07/2022 FINDINGS: Lower chest: Lung bases demonstrate no acute airspace disease. Than like areas of atelectasis at the right middle lobe and bilateral bases. Hepatobiliary: Numerous hypodense liver lesions, mostly subcentimeter, not clearly seen on more remote prior  exams. Right hepatic lobe lesion on series 2, image 15 measures 15 mm, previously 11 mm on PET CT from January. No calcified gallstone or biliary dilatation Pancreas: Unremarkable. No pancreatic ductal dilatation or surrounding inflammatory changes. Spleen: Normal in size without focal abnormality. Adrenals/Urinary Tract: Adrenal glands are unremarkable. Kidneys are normal, without renal calculi, focal lesion, or hydronephrosis. Bladder is unremarkable. Stomach/Bowel: Stomach is nonenlarged. There is no dilated small bowel. Air and fecal distension of the colon measuring up to 7.3 cm, with relative caliber change at the descending colon. Focal narrowed area at the proximal descending colon on coronal series 8, image 85 through 90. Vascular/Lymphatic: Nonaneurysmal aorta.  No suspicious lymph nodes. Reproductive: Lobulated soft tissue masses, some with calcification measuring up to 5.2 cm, presumably representing uterine fibroids. Other: Negative for pelvic effusion or free air. Numerous small subcutaneous nodules within the posterior flank and gluteal regions. These were not hypermetabolic on recent PET CT and are unchanged. Musculoskeletal: Widespread skeletal metastatic disease. No acute osseous abnormality IMPRESSION: 1. Air and fecal distension of the colon measuring up to 7.3 cm, with relative caliber change at the descending colon, suggesting a degree of colonic obstruction. Focal narrowed area at the proximal descending colon, possible stricture or mass, but some air and stool seen distal to this. Correlation with colonoscopy is suggested. 2. Numerous hypodense liver lesions, mostly subcentimeter, not clearly seen on more remote prior exams. Findings are suspicious for progressed metastatic disease. The lesion seen on recent PET CT in the right hepatic lobe appears slightly increased in size. 3. Widespread skeletal metastatic disease. 4. Uterine fibroids Electronically Signed   By: Jasmine Pang M.D.   On:  08/16/2023 18:08     Pamella Pert, MD, PhD [facility]  Between 7 am - 7 pm I am available, please contact me via Amion (for emergencies) or Securechat (non urgent messages)  Between 7 pm - 7 am I am not available, please contact night coverage MD/APP via Amion

## 2023-08-17 NOTE — ED Notes (Signed)
ED TO INPATIENT HANDOFF REPORT  ED Nurse Name and Phone #: Crist Infante RN [phone]  S Name/Age/Gender Michelle Solomon 63 y.o. female Room/Bed: WA13/WA13  Code Status   Code Status: Full Code  Home/SNF/Other Home Patient oriented to: self, place, time, and situation Is this baseline? Yes   Triage Complete: Triage complete  Chief Complaint Colonic stricture [facility]) [K56.699]  Triage Note Pt reports lower back pain and lower abdominal pain. No bm in 3 days, states not passing flatus either. Also thinks she may have a UTI.   Allergies Allergies  Allergen Reactions   Shellfish Allergy Anaphylaxis    Level of Care/Admitting Diagnosis ED Disposition     ED Disposition  Admit   Condition  --   Comment  Hospital Area: [facility] [[id number]]  Level of Care: Med-Surg [[id number]]  May admit patient to [facility] or [facility] if equivalent level of care is available:: No  Covid Evaluation: Asymptomatic - no recent exposure (last 10 days) testing not required  Diagnosis: Colonic stricture [facility]) [[id number]]  Admitting Physician: Dolly Rias [[id number]]  Attending Physician: Dolly Rias [[id number]]  Certification:: I certify this patient will need inpatient services for at least 2 midnights  Expected Medical Readiness: 08/18/2023          B Medical/Surgery History 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   Cancer (HCC)    Neuro endocrine tumor   CONJUNCTIVITIS, ALLERGIC 11/27/2008   HEMORRHOIDS 06/16/2008   HTN (hypertension)    Hx of adenomatous colonic polyps 11/2019   NIPPLE DISCHARGE 01/21/2010   Other specified forms of hearing loss 10/08/2009   Overweight(278.02) 05/29/2007   RASH-NONVESICULAR 05/29/2007    Wheezing 06/22/2010   Past Surgical History:  Procedure Laterality Date   COLONOSCOPY       A IV Location/Drains/Wounds Patient Lines/Drains/Airways Status     Active Line/Drains/Airways     Name Placement date Placement time Site Days   Peripheral IV 08/17/23 20 G 1" Anterior;Left;Upper Arm 08/17/23  1143  Arm  less than 1            Intake/Output Last 24 hours  Intake/Output Summary (Last 24 hours) at 08/17/2023 1200 Last data filed at 08/16/2023 1741 Gross per 24 hour  Intake 500 ml  Output --  Net 500 ml    Labs/Imaging Results for orders placed or performed during the hospital encounter of 08/16/23 (from the past 48 hours)  CBC with Differential     Status: Abnormal   Collection Time: 08/16/23  1:55 PM  Result Value Ref Range   WBC 11.6 (H) 4.0 - 10.5 K/uL   RBC 4.48 3.87 - 5.11 MIL/uL   Hemoglobin 11.9 (L) 12.0 - 15.0 g/dL   HCT 08.6 57.8 - 46.9 %   MCV 85.5 80.0 - 100.0 fL   MCH 26.6 26.0 - 34.0 pg   MCHC 31.1 30.0 - 36.0 g/dL   RDW 62.9 52.8 - 41.3 %   Platelets 217 150 - 400 K/uL   nRBC 0.0 0.0 - 0.2 %   Neutrophils Relative % 68 %   Neutro Abs 7.9 (H) 1.7 - 7.7 K/uL   Lymphocytes Relative 20 %   Lymphs Abs 2.3 0.7 -  4.0 K/uL   Monocytes Relative 11 %   Monocytes Absolute 1.2 (H) 0.1 - 1.0 K/uL   Eosinophils Relative 0 %   Eosinophils Absolute 0.0 0.0 - 0.5 K/uL   Basophils Relative 0 %   Basophils Absolute 0.0 0.0 - 0.1 K/uL   Immature Granulocytes 1 %   Abs Immature Granulocytes 0.10 (H) 0.00 - 0.07 K/uL    Comment: Performed at [facility], [address], [place], [place] [address]  Comprehensive metabolic panel     Status: Abnormal   Collection Time: 08/16/23  1:55 PM  Result Value Ref Range   Sodium 136 135 - 145 mmol/L   Potassium 3.9 3.5 - 5.1 mmol/L   Chloride 101 98 - 111 mmol/L   CO2 25 22 - 32 mmol/L   Glucose, Bld 145 (H) 70 - 99 mg/dL    Comment: Glucose reference range applies only to samples taken after  fasting for at least 8 hours.   BUN 9 8 - 23 mg/dL   Creatinine, Ser 6.04 0.44 - 1.00 mg/dL   Calcium 9.1 8.9 - 54.0 mg/dL   Total Protein 8.1 6.5 - 8.1 g/dL   Albumin 3.8 3.5 - 5.0 g/dL   AST 31 15 - 41 U/L   ALT 30 0 - 44 U/L   Alkaline Phosphatase 93 38 - 126 U/L   Total Bilirubin 0.8 0.0 - 1.2 mg/dL   GFR, Estimated >98 >11 mL/min    Comment: (NOTE) Calculated using the CKD-EPI Creatinine Equation (2021)    Anion gap 10 5 - 15    Comment: Performed at [facility], [address], [place], [place] [address]  Lipase, blood     Status: None   Collection Time: 08/16/23  1:55 PM  Result Value Ref Range   Lipase 20 11 - 51 U/L    Comment: Performed at [facility], [address] [place], [place] [address]  Blood culture (routine x 2)     Status: None (Preliminary result)   Collection Time: 08/16/23  1:55 PM   Specimen: BLOOD  Result Value Ref Range   Specimen Description      BLOOD SITE NOT SPECIFIED Performed at [facility], [address] [place], [place] [address]    Special Requests      BOTTLES DRAWN AEROBIC AND ANAEROBIC Blood Culture adequate volume Performed at [facility], [address], [place], [place] [address]    Culture      NO GROWTH < 24 HOURS Performed at [facility], [address], [place], [place] [address]    Report Status PENDING   Urinalysis, Routine w reflex microscopic -Urine, Clean Catch     Status: Abnormal   Collection Time: 08/16/23  2:39 PM  Result Value Ref Range   Color, Urine YELLOW YELLOW   APPearance CLEAR CLEAR   Specific Gravity, Urine 1.011 1.005 - 1.030   pH 7.0 5.0 - 8.0   Glucose, UA NEGATIVE NEGATIVE mg/dL   Hgb urine dipstick SMALL (A) NEGATIVE   Bilirubin Urine NEGATIVE NEGATIVE   Ketones, ur 20 (A) NEGATIVE mg/dL   Protein, ur 30 (A) NEGATIVE mg/dL   Nitrite NEGATIVE NEGATIVE   Leukocytes,Ua NEGATIVE NEGATIVE   RBC / HPF 6-10 0 - 5  RBC/hpf   WBC, UA 0-5 0 - 5 WBC/hpf   Bacteria, UA RARE (A) NONE SEEN   Squamous Epithelial / HPF 0-5 0 - 5 /HPF  Mucus PRESENT     Comment: Performed at [facility], [address] [place], [place] [address]  Basic metabolic panel     Status: Abnormal   Collection Time: 08/17/23  6:40 AM  Result Value Ref Range   Sodium 137 135 - 145 mmol/L   Potassium 3.8 3.5 - 5.1 mmol/L   Chloride 102 98 - 111 mmol/L   CO2 25 22 - 32 mmol/L   Glucose, Bld 134 (H) 70 - 99 mg/dL    Comment: Glucose reference range applies only to samples taken after fasting for at least 8 hours.   BUN 9 8 - 23 mg/dL   Creatinine, Ser 0.98 (L) 0.44 - 1.00 mg/dL   Calcium 9.0 8.9 - 11.9 mg/dL   GFR, Estimated >14 >78 mL/min    Comment: (NOTE) Calculated using the CKD-EPI Creatinine Equation (2021)    Anion gap 10 5 - 15    Comment: Performed at [facility], [address] [place], [place] [address]  Magnesium     Status: None   Collection Time: 08/17/23  6:40 AM  Result Value Ref Range   Magnesium 2.1 1.7 - 2.4 mg/dL    Comment: Performed at [facility], [address], [place], [place] [address]  Phosphorus     Status: Abnormal   Collection Time: 08/17/23  6:40 AM  Result Value Ref Range   Phosphorus 2.4 (L) 2.5 - 4.6 mg/dL    Comment: Performed at [facility], [address] [place], [place] [address]  CBC     Status: Abnormal   Collection Time: 08/17/23  6:40 AM  Result Value Ref Range   WBC 9.4 4.0 - 10.5 K/uL   RBC 4.10 3.87 - 5.11 MIL/uL   Hemoglobin 10.9 (L) 12.0 - 15.0 g/dL   HCT 96.2 (L) 95.2 - 84.1 %   MCV 86.3 80.0 - 100.0 fL   MCH 26.6 26.0 - 34.0 pg   MCHC 30.8 30.0 - 36.0 g/dL   RDW 32.4 40.1 - 02.7 %   Platelets 206 150 - 400 K/uL   nRBC 0.0 0.0 - 0.2 %    Comment: Performed at [facility], [address] [place], [place] [address]  Protime-INR     Status: None   Collection Time:  08/17/23  6:40 AM  Result Value Ref Range   Prothrombin Time 14.0 11.4 - 15.2 seconds   INR 1.1 0.8 - 1.2    Comment: (NOTE) INR goal varies based on device and disease states. Performed at [facility], [address] [place], [place] [address]    DG Abd 2 Views Result Date: 08/17/2023 CLINICAL DATA:  63 year old female with history of colonic stricture. EXAM: ABDOMEN - 2 VIEW COMPARISON:  No priors. FINDINGS: The bowel gas pattern is normal. There is no evidence of free air. No radio-opaque calculi or other significant radiographic abnormality is seen. IMPRESSION: Negative. Electronically Signed   By: Trudie Reed M.D.   On: 08/17/2023 06:20   MR THORACIC SPINE W WO CONTRAST Result Date: 08/16/2023 CLINICAL DATA:  Metastatic neuroendocrine tumor EXAM: MRI THORACIC WITHOUT AND WITH CONTRAST TECHNIQUE: Multiplanar and multiecho pulse sequences of the thoracic spine were obtained without and with intravenous contrast. CONTRAST:  8mL GADAVIST GADOBUTROL 1 MMOL/ML IV SOLN COMPARISON:  MRI 10/18/2019 FINDINGS: Alignment:  No malalignment. Vertebrae: No regional fracture. Marked worsening of widespread metastatic disease throughout the bones of the region including the spine  and ribs. No pathologic fracture is seen. No visible extraosseous extension of tumor. Cord:  No primary cord lesion. Paraspinal and other soft tissues: Multiple T2 bright lesions seen throughout the liver. Disc levels: No disc herniation. There is marked facet and ligamentous hypertrophy at the T9-10 level, right more than left, with encroachment upon the spinal canal but no actual compression of the cord. IMPRESSION: 1. Marked worsening of widespread metastatic disease throughout the bones of the region including the spine and ribs. No pathologic fracture is seen. No visible extraosseous extension of tumor. 2. Multiple T2 bright lesions throughout the liver, consistent with metastatic disease. 3. Marked facet  and ligamentous hypertrophy at the T9-10 level, right more than left, with encroachment upon the spinal canal but no actual compression of the cord. Electronically Signed   By: Paulina Fusi M.D.   On: 08/16/2023 20:28   MR Lumbar Spine W Wo Contrast Result Date: 08/16/2023 CLINICAL DATA:  Low back pain. Cancer suspected. History of neuroendocrine tumor. EXAM: MRI LUMBAR SPINE WITHOUT AND WITH CONTRAST TECHNIQUE: Multiplanar and multiecho pulse sequences of the lumbar spine were obtained without and with intravenous contrast. CONTRAST:  8mL GADAVIST GADOBUTROL 1 MMOL/ML IV SOLN COMPARISON:  CT abdomen same day.  PET scan 07/31/2023. FINDINGS: Segmentation:  5 lumbar type vertebral bodies. Alignment:  Normal Vertebrae: No evidence regional fracture. The study shows complete marrow replacement by tumor throughout the skeletal system. There is no evidence of acute pathologic fracture. No extraosseous tumor extension is identified. Conus medullaris and cauda equina: Conus extends to the L1 level. Conus and cauda equina appear normal. Paraspinal and other soft tissues: Negative Disc levels: No significant disc level finding at L3-4 or above. L4-5: Mild bulging of the disc. Mild facet and ligamentous hypertrophy. No compressive stenosis. L5-S1: Moderate bulging of the disc. Mild facet and ligamentous hypertrophy. No compressive stenosis. IMPRESSION: 1. Widespread osseous metastatic disease throughout the regional osseous structures. No evidence of acute pathologic fracture. No extraosseous tumor extension. 2. L4-5: Mild bulging of the disc. Mild facet and ligamentous hypertrophy. No compressive stenosis. 3. L5-S1: Moderate bulging of the disc. Mild facet and ligamentous hypertrophy. No compressive stenosis. Electronically Signed   By: Paulina Fusi M.D.   On: 08/16/2023 20:21   CT ABDOMEN PELVIS W CONTRAST Result Date: 08/16/2023 CLINICAL DATA:  No bowel movements EXAM: CT ABDOMEN AND PELVIS WITH CONTRAST TECHNIQUE:  Multidetector CT imaging of the abdomen and pelvis was performed using the standard protocol following bolus administration of intravenous contrast. RADIATION DOSE REDUCTION: This exam was performed according to the departmental dose-optimization program which includes automated exposure control, adjustment of the mA and/or kV according to patient size and/or use of iterative reconstruction technique. CONTRAST:  OMNIPAQUE IOHEXOL 300 MG/ML  SOLN COMPARISON:  CT 08/26/2019, PET CT 07/31/2023, 06/07/2022 FINDINGS: Lower chest: Lung bases demonstrate no acute airspace disease. Than like areas of atelectasis at the right middle lobe and bilateral bases. Hepatobiliary: Numerous hypodense liver lesions, mostly subcentimeter, not clearly seen on more remote prior exams. Right hepatic lobe lesion on series 2, image 15 measures 15 mm, previously 11 mm on PET CT from January. No calcified gallstone or biliary dilatation Pancreas: Unremarkable. No pancreatic ductal dilatation or surrounding inflammatory changes. Spleen: Normal in size without focal abnormality. Adrenals/Urinary Tract: Adrenal glands are unremarkable. Kidneys are normal, without renal calculi, focal lesion, or hydronephrosis. Bladder is unremarkable. Stomach/Bowel: Stomach is nonenlarged. There is no dilated small bowel. Air and fecal distension of the colon measuring up  to 7.3 cm, with relative caliber change at the descending colon. Focal narrowed area at the proximal descending colon on coronal series 8, image 85 through 90. Vascular/Lymphatic: Nonaneurysmal aorta.  No suspicious lymph nodes. Reproductive: Lobulated soft tissue masses, some with calcification measuring up to 5.2 cm, presumably representing uterine fibroids. Other: Negative for pelvic effusion or free air. Numerous small subcutaneous nodules within the posterior flank and gluteal regions. These were not hypermetabolic on recent PET CT and are unchanged. Musculoskeletal: Widespread  skeletal metastatic disease. No acute osseous abnormality IMPRESSION: 1. Air and fecal distension of the colon measuring up to 7.3 cm, with relative caliber change at the descending colon, suggesting a degree of colonic obstruction. Focal narrowed area at the proximal descending colon, possible stricture or mass, but some air and stool seen distal to this. Correlation with colonoscopy is suggested. 2. Numerous hypodense liver lesions, mostly subcentimeter, not clearly seen on more remote prior exams. Findings are suspicious for progressed metastatic disease. The lesion seen on recent PET CT in the right hepatic lobe appears slightly increased in size. 3. Widespread skeletal metastatic disease. 4. Uterine fibroids Electronically Signed   By: Jasmine Pang M.D.   On: 08/16/2023 18:08    Pending Labs Unresulted Labs (From admission, onward)     Start     Ordered   08/18/23 0500  Comprehensive metabolic panel  Tomorrow morning,   R        08/17/23 1004   08/18/23 0500  CBC  Tomorrow morning,   R        08/17/23 1004   08/18/23 0500  Magnesium  Tomorrow morning,   R        08/17/23 1004   08/18/23 0500  Phosphorus  Tomorrow morning,   R        08/17/23 1004   08/17/23 0500  HIV Antibody (routine testing w rflx)  (HIV Antibody (Routine testing w reflex) panel)  Tomorrow morning,   R        08/16/23 2117   08/16/23 1231  Blood culture (routine x 2)  BLOOD CULTURE X 2,   R (with STAT occurrences)      08/16/23 1231            Vitals/Pain Today's Vitals   08/17/23 1033 08/17/23 1100 08/17/23 1112 08/17/23 1120  BP:  (!) 148/89    Pulse:  96    Resp:  18    Temp:   98.2 F (36.8 C)   TempSrc:   Oral   SpO2:  100%    Weight:      Height:      PainSc: 8    5     Isolation Precautions No active isolations  Medications Medications  polyethylene glycol (MIRALAX / GLYCOLAX) packet 17 g (17 g Oral Given 08/17/23 0951)  senna (SENOKOT) tablet 17.2 mg (17.2 mg Oral Given 08/17/23 0951)   bisacodyl (DULCOLAX) suppository 10 mg (has no administration in time range)  buPROPion (WELLBUTRIN XL) 24 hr tablet 300 mg (300 mg Oral Given 08/17/23 0951)  acetaminophen (TYLENOL) tablet 1,000 mg (has no administration in time range)  ondansetron (ZOFRAN) injection 4 mg (has no administration in time range)  oxyCODONE (Oxy IR/ROXICODONE) immediate release tablet 2.5 mg ( Oral See Alternative 08/17/23 1042)    Or  oxyCODONE (Oxy IR/ROXICODONE) immediate release tablet 5 mg (5 mg Oral Given 08/17/23 1042)  HYDROmorphone (DILAUDID) injection 0.5 mg (0.5 mg Intravenous Given 08/17/23 0953)  0.9 %  sodium  chloride infusion ( Intravenous New Bag/Given 08/17/23 0639)  nicotine polacrilex (NICORETTE) gum 2 mg (has no administration in time range)  losartan (COZAAR) tablet 25 mg (25 mg Oral Given 08/17/23 1042)  potassium PHOSPHATE 15 mmol in dextrose 5 % 250 mL infusion (15 mmol Intravenous New Bag/Given 08/17/23 1123)  peg 3350 powder (MOVIPREP) kit 100 g (has no administration in time range)  sodium chloride 0.9 % bolus 500 mL (0 mLs Intravenous Stopped 08/16/23 1741)  iohexol (OMNIPAQUE) 300 MG/ML solution 100 mL (100 mLs Intravenous Contrast Given 08/16/23 1549)  HYDROmorphone (DILAUDID) injection 0.5 mg (0.5 mg Intravenous Given 08/16/23 1540)  HYDROmorphone (DILAUDID) injection 1 mg (1 mg Intravenous Given 08/16/23 1737)  gadobutrol (GADAVIST) 1 MMOL/ML injection 8 mL (8 mLs Intravenous Contrast Given 08/16/23 1903)  sodium chloride 0.9 % bolus 500 mL (500 mLs Intravenous Bolus 08/16/23 2244)  mineral oil enema 1 enema (1 enema Rectal Given 08/16/23 2246)  polyethylene glycol-electrolytes (NuLYTELY) solution 4,000 mL (4,000 mLs Oral Given 08/17/23 1043)    Mobility walks     Focused Assessments Neuro Assessment Handoff:  Swallow screen pass?  N/A         Neuro Assessment: Within Defined Limits Neuro Checks:      Has TPA been given? No If patient is a Neuro Trauma and patient is going to OR  before floor call report to 4N Charge nurse: [phone] or [phone]   R Recommendations: See Admitting Provider Note  Report given to:   Additional Notes:

## 2023-08-17 NOTE — H&P (Signed)
History and Physical    Michelle Solomon ZHY:[id number] DOB: Apr 13, 1961 DOA: 08/16/2023  PCP: Corwin Levins, MD   Patient coming from: Home   Chief Complaint:  Chief Complaint  Patient presents with   Abdominal Pain   Back Pain    HPI:  Michelle Solomon is a 63 y.o. female with hx of metastatic neuroendocrine tumor with mets to bone, lymph nodes, liver, hypertension, asthma, who presented with symptoms of obstipation and lower abdominal pain.  Reports last BM was on Saturday and normally has a bowel movement every other day.  Noted that has not been passing gas since Monday.  She has associated flank and lower abdominal pain.  Abdominal pain has become more constant.  No nausea, vomiting.  Previously bowel movements were normal, no hematochezia or melena.  No history of abdominal surgeries.  Last colonoscopy included below in 2021 with polyps that were resected.    Review of Systems:  ROS complete and negative except as marked above   Allergies  Allergen Reactions   Shellfish Allergy Anaphylaxis    Prior to Admission medications   Medication Sig Start Date End Date Taking? Authorizing Provider  buPROPion (WELLBUTRIN XL) 300 MG 24 hr tablet TAKE 1 TABLET(300 MG) BY MOUTH DAILY 04/13/22  Yes Corwin Levins, MD  cholecalciferol (VITAMIN D3) 25 MCG (1000 UNIT) tablet Take 1,000 Units by mouth daily.   Yes [provider]  ferrous sulfate 325 (65 FE) MG tablet Take 1 tablet (325 mg total) by mouth daily with breakfast. Please take with a source of Vitamin C 07/06/23  Yes Jaci Standard, MD  GARLIC PO Take 1 tablet by mouth daily. 10/26/22  Yes [provider]  Edrick Oh BILOBA EXTRACT PO Take 1 tablet by mouth daily. 10/26/22  Yes [provider]  hydrochlorothiazide (HYDRODIURIL) 12.5 MG tablet TAKE 1 TABLET(12.5 MG) BY MOUTH DAILY 05/10/23  Yes Corwin Levins, MD  lanreotide acetate (SOMATULINE DEPOT) 120 MG/0.5ML injection Inject 120 mg into the skin every 28  (twenty-eight) days. 07/06/21  Yes Georga Kaufmann T, PA-C  loratadine (CLARITIN) 10 MG tablet Take 10 mg by mouth daily as needed for allergies.   Yes [provider]  losartan (COZAAR) 50 MG tablet TAKE 1 TABLET(50 MG) BY MOUTH DAILY 04/24/23  Yes Corwin Levins, MD  ondansetron (ZOFRAN) 8 MG tablet Take 1 tablet (8 mg total) by mouth 2 (two) times daily as needed for nausea or vomiting. 06/10/21  Yes Lorna Few, MD  Turmeric 500 MG CAPS Take 500 mg by mouth daily.   Yes [provider]  VITAMIN D-VITAMIN K PO Take 1 tablet by mouth daily.   Yes [provider]  oxyCODONE-acetaminophen (PERCOCET/ROXICET) 5-325 MG tablet Take 1 tablet by mouth every 6 (six) hours as needed for severe pain. Patient not taking: Reported on 08/17/2023 04/13/22   Corwin Levins, MD  zolpidem (AMBIEN) 10 MG tablet Take 1 tablet (10 mg total) by mouth at bedtime as needed for sleep. 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 She started smoking about 43 years ago. She has a 20 pack-year smoking history. She has never used smokeless tobacco. She reports current alcohol use. She reports that she does not use drugs.  Family History  Problem Relation Age of Onset    Allergies Sister    Diabetes Sister    Hypertension Sister    Hypertension Mother    Prostate cancer Father    Hypertension Brother    Hypertension Sister    Diabetes Paternal Aunt    Cancer Paternal Aunt        type unknown   Prostate cancer Brother    Stroke Maternal Aunt    Hypertension Maternal Aunt    Colon cancer Neg Hx    Esophageal cancer Neg Hx    Rectal cancer Neg Hx    Stomach cancer Neg Hx      Physical Exam: Vitals:   08/16/23 2327 08/17/23 0044 08/17/23 0234 08/17/23 0330  BP:  (!) 157/102 (!) 159/135 (!) 128/96  Pulse:  97 (!) 108   Resp:  18 18   Temp: 99.1 F (37.3 C)  99.2 F (37.3 C)   TempSrc: Oral  Oral   SpO2:  99% 98%   Weight:      Height:        Gen: Awake, alert, NAD   CV: Regular, normal S1, S2, no murmurs  Resp: Normal WOB, CTAB  Abd: Round, nondistended, hypoactive, mild diffuse tenderness.  No rebound, guarding, rigidity. MSK: Symmetric, no edema  Skin: No rashes or lesions to exposed skin  Neuro: Alert and interactive  Psych: euthymic, appropriate    Data review:   Labs reviewed, notable for:   WBC 11 UA positive ketone, rare bacteria not consistent with infection   Micro:  Results for orders placed or performed in visit on 11/08/19  SARS Coronavirus 2 (TAT 6-24 hrs)     Status: None   Collection Time: 11/08/19 12:00 AM  Result Value Ref Range Status   SARS Coronavirus 2 RESULT: NEGATIVE  Final    Comment: RESULT: NEGATIVESARS-CoV-2 INTERPRETATION:A NEGATIVE  test result means that SARS-CoV-2 RNA was not present in the specimen above the limit of detection of this test. This does not preclude a possible SARS-CoV-2 infection and should not be used as the  sole basis for patient management decisions. Negative results must be combined with clinical observations, patient history, and epidemiological information. Optimum specimen types and timing for peak viral levels during infections caused by SARS-CoV-2  have not been  determined. Collection of multiple specimens or types of specimens may be necessary to detect virus. Improper specimen collection and handling, sequence variability under primers/probes, or organism present below the limit of detection may  lead to false negative results. Positive and negative predictive values of testing are highly dependent on prevalence. False negative test results are more likely when prevalence of disease is high.The expected result is NEGATIVE.Fact S heet for  Healthcare Providers: [employer] Sheet for Patients: https://poole-fre[employer].org/ Reference Range - Negative     Imaging reviewed:  MR THORACIC SPINE W WO CONTRAST Result Date: 08/16/2023 CLINICAL DATA:  Metastatic neuroendocrine tumor  EXAM: MRI THORACIC WITHOUT AND WITH CONTRAST TECHNIQUE: Multiplanar and multiecho pulse sequences of the thoracic spine were obtained without and with intravenous contrast. CONTRAST:  8mL GADAVIST GADOBUTROL 1 MMOL/ML IV SOLN COMPARISON:  MRI 10/18/2019 FINDINGS: Alignment:  No malalignment. Vertebrae: No regional fracture. Marked worsening of widespread metastatic disease throughout the bones of the region including the spine and ribs. No pathologic fracture is seen. No visible extraosseous extension of tumor. Cord:  No primary cord lesion. Paraspinal and other soft tissues: Multiple T2 bright lesions seen throughout the liver. Disc levels: No disc herniation. There is marked facet and ligamentous hypertrophy at the T9-10 level, right more than left, with encroachment upon the spinal canal but no actual compression of the cord. IMPRESSION: 1. Marked worsening of widespread metastatic disease throughout the bones of the region including the spine and ribs. No pathologic fracture is seen. No visible extraosseous extension of tumor. 2. Multiple T2 bright lesions throughout the liver, consistent with metastatic disease. 3. Marked facet and  ligamentous hypertrophy at the T9-10 level, right more than left, with encroachment upon the spinal canal but no actual compression of the cord. Electronically Signed   By: Paulina Fusi M.D.   On: 08/16/2023 20:28   MR Lumbar Spine W Wo Contrast Result Date: 08/16/2023 CLINICAL DATA:  Low back pain. Cancer suspected. History of neuroendocrine tumor. EXAM: MRI LUMBAR SPINE WITHOUT AND WITH CONTRAST TECHNIQUE: Multiplanar and multiecho pulse sequences of the lumbar spine were obtained without and with intravenous contrast. CONTRAST:  8mL GADAVIST GADOBUTROL 1 MMOL/ML IV SOLN COMPARISON:  CT abdomen same day.  PET scan 07/31/2023. FINDINGS: Segmentation:  5 lumbar type vertebral bodies. Alignment:  Normal Vertebrae: No evidence regional fracture. The study shows complete marrow replacement by tumor throughout the skeletal system. There is no evidence of acute pathologic fracture. No extraosseous tumor extension is identified. Conus medullaris and cauda equina: Conus extends to the L1 level. Conus and cauda equina appear normal. Paraspinal and other soft tissues: Negative Disc levels: No significant disc level finding at L3-4 or above. L4-5: Mild bulging of the disc. Mild facet and ligamentous hypertrophy. No compressive stenosis. L5-S1: Moderate bulging of the disc. Mild facet and ligamentous hypertrophy. No compressive stenosis. IMPRESSION: 1. Widespread osseous metastatic disease throughout the regional osseous structures. No evidence of acute pathologic fracture. No extraosseous tumor extension. 2. L4-5: Mild bulging of the disc. Mild facet and ligamentous hypertrophy. No compressive stenosis. 3. L5-S1: Moderate bulging of the disc. Mild facet and ligamentous hypertrophy. No compressive stenosis. Electronically Signed   By: Paulina Fusi M.D.   On: 08/16/2023 20:21   CT ABDOMEN PELVIS W CONTRAST Result Date: 08/16/2023 CLINICAL DATA:  No bowel movements EXAM: CT ABDOMEN AND PELVIS WITH CONTRAST TECHNIQUE:  Multidetector CT imaging of the abdomen and pelvis was performed using the standard protocol following bolus administration of intravenous contrast. RADIATION DOSE REDUCTION: This exam was performed according to the departmental dose-optimization program which includes automated exposure control, adjustment of the mA and/or kV according to patient size and/or use of iterative reconstruction technique. CONTRAST:  OMNIPAQUE IOHEXOL 300 MG/ML  SOLN COMPARISON:  CT 08/26/2019, PET CT 07/31/2023, 06/07/2022 FINDINGS: Lower chest: Lung bases demonstrate no acute airspace disease. Than like areas of atelectasis at the right middle lobe and bilateral bases. Hepatobiliary: Numerous hypodense liver lesions, mostly subcentimeter, not clearly seen on more remote prior exams. Right hepatic lobe lesion on series 2, image 15 measures 15 mm, previously 11 mm on PET CT from January.  No calcified gallstone or biliary dilatation Pancreas: Unremarkable. No pancreatic ductal dilatation or surrounding inflammatory changes. Spleen: Normal in size without focal abnormality. Adrenals/Urinary Tract: Adrenal glands are unremarkable. Kidneys are normal, without renal calculi, focal lesion, or hydronephrosis. Bladder is unremarkable. Stomach/Bowel: Stomach is nonenlarged. There is no dilated small bowel. Air and fecal distension of the colon measuring up to 7.3 cm, with relative caliber change at the descending colon. Focal narrowed area at the proximal descending colon on coronal series 8, image 85 through 90. Vascular/Lymphatic: Nonaneurysmal aorta.  No suspicious lymph nodes. Reproductive: Lobulated soft tissue masses, some with calcification measuring up to 5.2 cm, presumably representing uterine fibroids. Other: Negative for pelvic effusion or free air. Numerous small subcutaneous nodules within the posterior flank and gluteal regions. These were not hypermetabolic on recent PET CT and are unchanged. Musculoskeletal: Widespread  skeletal metastatic disease. No acute osseous abnormality IMPRESSION: 1. Air and fecal distension of the colon measuring up to 7.3 cm, with relative caliber change at the descending colon, suggesting a degree of colonic obstruction. Focal narrowed area at the proximal descending colon, possible stricture or mass, but some air and stool seen distal to this. Correlation with colonoscopy is suggested. 2. Numerous hypodense liver lesions, mostly subcentimeter, not clearly seen on more remote prior exams. Findings are suspicious for progressed metastatic disease. The lesion seen on recent PET CT in the right hepatic lobe appears slightly increased in size. 3. Widespread skeletal metastatic disease. 4. Uterine fibroids Electronically Signed   By: Jasmine Pang M.D.   On: 08/16/2023 18:08     ED Course:  Treated with 500 cc fluids, Dilaudid.  EDP discussed case with [facility] on-call Dr. Chales Abrahams, their group will consult in the morning   I called to clarify recommendations re: possible bowel prep. recommend for clear liquid diet as tolerates and hold off on colonoscopy prep until their evaluation.   Assessment/Plan:  63 y.o. female with hx metastatic neuroendocrine tumor with mets to bone, lymph nodes, liver, hypertension, asthma, who presented with symptoms of obstipation and lower abdominal pain.  Found to have partial large bowel obstruction from area of stricture or mass in the descending colon.  Additional findings with progression of NET metastasis.   Partial large bowel obstruction, descending colon Colonic stricture versus mass Last BM 2/8, reports no flatus since 2/10. CT A/P with "Air and fecal distension of the colon measuring up to 7.3 cm, with relative caliber change at the descending colon, suggesting a degree of colonic obstruction. Focal narrowed area at the proximal descending colon, possible stricture or mass, but some air and stool seen distal to this." Concern for malignant stricture with  her hx of NET.  -GI consult, [place]. Spoke wiith Dr. Chales Abrahams recommends for clear liquid diet for now, wants to hold off on prep for colonoscopy until after their evaluation  -Bowel regimen with MiraLAX twice daily, senna twice daily, bisacodyl suppository as needed, one-time mineral oil enema tonight. -Clear liquid diet for now -Give additional 500 cc fluid then start maintenance IV fluid NS at 75 cc an hour -Symptomatic management Zofran as needed, Tylenol prn Mild, oxycodone 2.5/5 mg for moderate/severe, Dilaudid 0.5 mg IV every 4 as needed for breakthrough.   Progression of NET metastasis Followed by Dr. Leonides Schanz, currently treated with monthly lanreotide injections and Zometa q12 weeks; but due to disease progression plan to switch to everolimus versus Lutathera. underwent MRI of T and L-spine in the ED due to complaint of back pain and  known NET mets.  MRI notable for marked worsening of bony metastasis; no pathologic fracture or extraosseous extension noted.  Also on the MRI T-spine as well as CT abdomen pelvis shows new numerous hypodense liver lesions mostly subcentimeter not seen on previous exams, PET/CT lesion seen in the right hepatic lobe increased in size; T2 hyperintense of MRI; Suggestive of liver metastases.  -Please contact her oncologist office in a.m. to notify of hospitalization and progression of metastasis; added Dr. Leonides Schanz to treatment team -I have discussed findings including progression of metastatic disease with the patient.  Smoking cessation  Currently smoking ~ 2 cigarettes per day.  -Counseled on smoking cessation -Nicotine gum as needed, asked for Rx at time of discharge  Incidental findings on imaging:  T9-10 spondylosis with spinal stenosis Uterine fibroids  Chronic medical problems: Hypertension: Hold her home losartan, hydrochlorothiazide with suspected volume depletion  Asthma: albuterol prn  Mood disorder: Continue home bupropion   Body mass index is 31  kg/m. Obesity class I, affecting medical care per above    DVT prophylaxis:  SCDs Code Status:  Full Code Diet:  Diet Orders (From admission, onward)     Start     Ordered   08/16/23 2117  Diet clear liquid Room service appropriate? Yes; Fluid consistency: Thin  Diet effective now       Question Answer Comment  Room service appropriate? Yes   Fluid consistency: Thin      08/16/23 2117           Family Communication:  Yes discussed with sisters at the bedside   Consults:  GI, [place]   Admission status:   Inpatient, Med-Surg  Severity of Illness: The appropriate patient status for this patient is INPATIENT. Inpatient status is judged to be reasonable and necessary in order to provide the required intensity of service to ensure the patient's safety. The patient's presenting symptoms, physical exam findings, and initial radiographic and laboratory data in the context of their chronic comorbidities is felt to place them at high risk for further clinical deterioration. Furthermore, it is not anticipated that the patient will be medically stable for discharge from the hospital within 2 midnights of admission.   * I certify that at the point of admission it is my clinical judgment that the patient will require inpatient hospital care spanning beyond 2 midnights from the point of admission due to high intensity of service, high risk for further deterioration and high frequency of surveillance required.*   Dolly Rias, MD [facility]  How to contact the [facility] Attending or Consulting provider 7A - 7P or covering provider during after hours 7P -7A, for this patient.  Check the care team in [facility] and look for a) attending/consulting [facility] provider listed and b) the [facility] team listed Log into [email or web address] and use [place]'s universal password to access. If you do not have the password, please contact the hospital operator. Locate the [facility] provider you are looking for under [facility] and page to a number that you can be directly reached. If you still have difficulty reaching the provider, please page the [facility] (Director on Call) for the Hospitalists listed on amion for assistance.  08/17/2023, 5:24 AM

## 2023-08-17 NOTE — Plan of Care (Signed)

## 2023-08-17 NOTE — Consult Note (Signed)
 Consultation  Referring Provider:   [facility] Primary Care Physician:  Corwin Levins, MD Primary Gastroenterologist:  Dr. Leone Payor       Reason for Consultation:   Obstipation and abdominal pain with abnormal CT DOA: 08/16/2023         Hospital Day: 2         HPI:   Michelle Solomon is a 63 y.o. female with past medical history significant for hypertension, asthma, colon polyps, metastatic neuroendocrine tumor with mets to bone, lymph nodes, liver follows with Dr. Leonides Schanz.   Patient previous low-grade neuroendocrine tumor of the rectum August 2006 during colonoscopy for rectal bleeding.  Surveillance flexible sigmoidoscopy December 2009 10 mm rectal nodule recurrent carcinoid but path compatible with rectal prolapse.  Subsequent flexible sigmoidoscopy with EUS showed recurrent low-grade neuroendocrine tumor.  Subsequent flexible sigmoidoscopy October 2011 without reoccurrence, repeat colonoscopy 11/2019 showed 5 diminutive polyps mixed adenomatous and hyperplastic recall 2026.  Presents to the ER with 2/12 with symptoms of obstipation and lower abdominal pain.  Patient's last bowel movement Saturday, having no flatulence since Monday.  Denies nausea vomiting Work up notable for  BUN 9, creatinine 0.60 normal electrolytes, unremarkable liver function WBC 11.6, Hgb 11.9, normal MCV 85.5, platelets normal 217 Urine small hemoglobin, ketones and protein rare bacteria Blood culture pending CT abdomen pelvis revealed colonic obstruction focal narrowing proximal descending colon possible stricture versus mass progress metastatic disease and liver, widespread skeletal metastatic disease.  Patient with family at bedside, sister. Provided some of the history.  Patient states at baseline she has a bowel movement every other day she would have worsening constipation when she is taking iron which she is currently on.  She did miss a dose of iron Friday so took 2 doses on Saturday. Patient normally does not  take anything for bowel movements at home.  She has been having some back pain and general overall pain which she has had some decreased movement and harder stools for as well.  She is on oxycodone at home as needed but normally a prescription can last her 6 months. She began to have worsening lower back pain and lower abdominal pain Sunday that got progressively worse.  She was taking Advil and Tylenol without any help.  She was having some left leg pain on Saturday denies any numbness, tingling, weakness. States her last bowel movement was on Saturday, soft brown no straining.  She began to not pass gas Monday with some minor abdominal distention. She denies any nausea, vomiting, fever, chills, urinary issues. Patient is not on a blood thinner. Patient had enema yesterday states there was no stool with that she also had a dose of MiraLAX and Dulcolax and has been passing gas.  Still no bowel movement since being here.  States currently her worst pain is her lower back diffuse across her back.  Abnormal ED labs: CT AB and pelvis 02/12 IMPRESSION: 1. Air and fecal distension of the colon measuring up to 7.3 cm, with relative caliber change at the descending colon, suggesting a degree of colonic obstruction. Focal narrowed area at the proximal descending colon, possible stricture or mass, but some air and stool seen distal to this. Correlation with colonoscopy is suggested. 2. Numerous hypodense liver lesions, mostly subcentimeter, not clearly seen on more remote prior exams. Findings are suspicious for progressed metastatic disease. The lesion seen on recent PET CT in the right hepatic lobe appears slightly increased in size. 3. Widespread skeletal metastatic  disease. 4. Uterine fibroids  11/12/2019 colonoscopy for surveillance with excellent prep with MiraLAX split dose  - Five diminutive polyps in the rectum and in the ascending colon, removed with a cold snare. Resected and retrieved.  -  The examination was otherwise normal on direct and retroflexion views. Mix adenomas and hyperplastic rectal polyps Repeat colonoscopy 2026 - 5 yrs  NM PET scan 07/31/2023 IMPRESSION: 1. Overall mild progression of multifocal skeletal metastasis involving the axillary and and appendicular skeleton. These intense radiotracer avid consistent well differentiated tumor. 2. New lesion in the RIGHT hepatic lobe consistent with progressive hepatic metastasis. 3. Interval increase in size of radiotracer avid nodule in the medial RIGHT upper chest. 4. Patient may be a good candidate for re-treatment with the Lu 177 DOTATATE after initial positive response.  Abnormal Labs Reviewed  CBC WITH DIFFERENTIAL/PLATELET - Abnormal; Notable for the following components:      Result Value   WBC 11.6 (*)    Hemoglobin 11.9 (*)    Neutro Abs 7.9 (*)    Monocytes Absolute 1.2 (*)    Abs Immature Granulocytes 0.10 (*)    All other components within normal limits  COMPREHENSIVE METABOLIC PANEL - Abnormal; Notable for the following components:   Glucose, Bld 145 (*)    All other components within normal limits  URINALYSIS, ROUTINE W REFLEX MICROSCOPIC - Abnormal; Notable for the following components:   Hgb urine dipstick SMALL (*)    Ketones, ur 20 (*)    Protein, ur 30 (*)    Bacteria, UA RARE (*)    All other components within normal limits  CBC - Abnormal; Notable for the following components:   Hemoglobin 10.9 (*)    HCT 35.4 (*)    All other components within normal limits    Past Medical History:  Diagnosis Date  . Acute bronchitis 06/06/2010  . ALLERGIC RHINITIS 05/29/2007  . Allergy    seasonal  . Anal or rectal pain 06/23/2008  . ANEMIA-IRON DEFICIENCY 05/29/2007  . Anxiety   . ASTHMA 05/29/2007  . Asthma   . 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 Cancer [facility])    Neuro endocrine tumor  . CONJUNCTIVITIS, ALLERGIC 11/27/2008  . HEMORRHOIDS 06/16/2008  . HTN (hypertension)    . Hx of adenomatous colonic polyps 11/2019  . NIPPLE DISCHARGE 01/21/2010  . Other specified forms of hearing loss 10/08/2009  . Overweight(278.02) 05/29/2007  . RASH-NONVESICULAR 05/29/2007  . Wheezing 06/22/2010    Surgical History:  She  has a past surgical history that includes Colonoscopy. Family History:  Her family history includes Allergies in her sister; Cancer in her paternal aunt; Diabetes in her paternal aunt and sister; Hypertension in her brother, maternal aunt, mother, sister, and sister; Prostate cancer in her brother and father; Stroke in her maternal aunt. Social History:   reports that she has been smoking cigarettes. She started smoking about 43 years ago. She has a 20 pack-year smoking history. She has never used smokeless tobacco. She reports current alcohol use. She reports that she does not use drugs.  Prior to Admission medications   Medication Sig Start Date End Date Taking? Authorizing Provider  buPROPion (WELLBUTRIN XL) 300 MG 24 hr tablet TAKE 1 TABLET(300 MG) BY MOUTH DAILY 04/13/22  Yes Corwin Levins, MD  cholecalciferol (VITAMIN D3) 25 MCG (1000 UNIT) tablet Take 1,000 Units by mouth daily.   Yes [provider]  ferrous sulfate 325 (65 FE) MG tablet Take 1 tablet (  325 mg total) by mouth daily with breakfast. Please take with a source of Vitamin C 07/06/23  Yes Jaci Standard, MD  GARLIC PO Take 1 tablet by mouth daily. 10/26/22  Yes [provider]  Edrick Oh BILOBA EXTRACT PO Take 1 tablet by mouth daily. 10/26/22  Yes [provider]  hydrochlorothiazide (HYDRODIURIL) 12.5 MG tablet TAKE 1 TABLET(12.5 MG) BY MOUTH DAILY 05/10/23  Yes Corwin Levins, MD  lanreotide acetate (SOMATULINE DEPOT) 120 MG/0.5ML injection Inject 120 mg into the skin every 28 (twenty-eight) days. 07/06/21  Yes Georga Kaufmann T, PA-C  loratadine (CLARITIN) 10 MG tablet Take 10 mg by mouth daily as needed for allergies.   Yes [provider]  losartan  (COZAAR) 50 MG tablet TAKE 1 TABLET(50 MG) BY MOUTH DAILY 04/24/23  Yes Corwin Levins, MD  ondansetron (ZOFRAN) 8 MG tablet Take 1 tablet (8 mg total) by mouth 2 (two) times daily as needed for nausea or vomiting. 06/10/21  Yes Lorna Few, MD  Turmeric 500 MG CAPS Take 500 mg by mouth daily.   Yes [provider]  VITAMIN D-VITAMIN K PO Take 1 tablet by mouth daily.   Yes [provider]  oxyCODONE-acetaminophen (PERCOCET/ROXICET) 5-325 MG tablet Take 1 tablet by mouth every 6 (six) hours as needed for severe pain. Patient not taking: Reported on 08/17/2023 04/13/22   Corwin Levins, MD  zolpidem (AMBIEN) 10 MG tablet Take 1 tablet (10 mg total) by mouth at bedtime as needed for sleep. 04/13/22 05/13/22  Corwin Levins, MD    Current Facility-Administered Medications  Medication Dose Route Frequency Provider Last Rate Last Admin  . 0.9 %  sodium chloride infusion   Intravenous Continuous Dolly Rias, MD 75 mL/hr at 08/17/23 0639 New Bag at 08/17/23 1610  . acetaminophen (TYLENOL) tablet 1,000 mg  1,000 mg Oral Q6H PRN Dolly Rias, MD      . bisacodyl (DULCOLAX) suppository 10 mg  10 mg Rectal Daily PRN Dolly Rias, MD      . buPROPion (WELLBUTRIN XL) 24 hr tablet 300 mg  300 mg Oral Daily Segars, Jonathan, MD      . HYDROmorphone (DILAUDID) injection 0.5 mg  0.5 mg Intravenous Q4H PRN Dolly Rias, MD   0.5 mg at 08/17/23 0259  . nicotine polacrilex (NICORETTE) gum 2 mg  2 mg Oral PRN Dolly Rias, MD      . ondansetron [facility]) injection 4 mg  4 mg Intravenous Q6H PRN Dolly Rias, MD      . oxyCODONE (Oxy IR/ROXICODONE) immediate release tablet 2.5 mg  2.5 mg Oral Q4H PRN Dolly Rias, MD       Or  . oxyCODONE (Oxy IR/ROXICODONE) immediate release tablet 5 mg  5 mg Oral Q4H PRN Dolly Rias, MD   5 mg at 08/17/23 9604  . polyethylene glycol (MIRALAX / GLYCOLAX) packet 17 g  17 g Oral BID Dolly Rias, MD   17 g at 08/16/23 2247  . senna  (SENOKOT) tablet 17.2 mg  2 tablet Oral BID Dolly Rias, MD   17.2 mg at 08/16/23 2247   Current Outpatient Medications  Medication Sig Dispense Refill  . buPROPion (WELLBUTRIN XL) 300 MG 24 hr tablet TAKE 1 TABLET(300 MG) BY MOUTH DAILY 90 tablet 3  . cholecalciferol (VITAMIN D3) 25 MCG (1000 UNIT) tablet Take 1,000 Units by mouth daily.    . ferrous sulfate 325 (65 FE) MG tablet Take 1 tablet (325 mg total) by mouth daily  with breakfast. Please take with a source of Vitamin C 90 tablet 3  . GARLIC PO Take 1 tablet by mouth daily.    [name] Kitchen GINKGO BILOBA EXTRACT PO Take 1 tablet by mouth daily.    . hydrochlorothiazide (HYDRODIURIL) 12.5 MG tablet TAKE 1 TABLET(12.5 MG) BY MOUTH DAILY 30 tablet 0  . lanreotide acetate (SOMATULINE DEPOT) 120 MG/0.5ML injection Inject 120 mg into the skin every 28 (twenty-eight) days. 0.5 mL 12  . loratadine (CLARITIN) 10 MG tablet Take 10 mg by mouth daily as needed for allergies.    [name] Kitchen losartan (COZAAR) 50 MG tablet TAKE 1 TABLET(50 MG) BY MOUTH DAILY 90 tablet 3  . ondansetron (ZOFRAN) 8 MG tablet Take 1 tablet (8 mg total) by mouth 2 (two) times daily as needed for nausea or vomiting. 20 tablet 0  . Turmeric 500 MG CAPS Take 500 mg by mouth daily.    [name] Kitchen VITAMIN D-VITAMIN K PO Take 1 tablet by mouth daily.    [name] Kitchen oxyCODONE-acetaminophen (PERCOCET/ROXICET) 5-325 MG tablet Take 1 tablet by mouth every 6 (six) hours as needed for severe pain. (Patient not taking: Reported on 08/17/2023) 30 tablet 0  . zolpidem (AMBIEN) 10 MG tablet Take 1 tablet (10 mg total) by mouth at bedtime as needed for sleep. 90 tablet 1   Facility-Administered Medications Ordered in Other Encounters  Medication Dose Route Frequency Provider Last Rate Last Admin  . octreotide (SANDOSTATIN LAR) 30 MG IM injection             Allergies as of 08/16/2023 - Review Complete 08/16/2023  Allergen Reaction Noted  . Shellfish allergy Anaphylaxis 10/19/2020    Review of Systems:     Constitutional: No weight loss, fever, chills, weakness or fatigue HEENT: Eyes: No change in vision               Ears, Nose, Throat:  No change in hearing or congestion Skin: No rash or itching Cardiovascular: No chest pain, chest pressure or palpitations   Respiratory: No SOB or cough Gastrointestinal: See HPI and otherwise negative Genitourinary: No dysuria or change in urinary frequency Neurological: No headache, dizziness or syncope Musculoskeletal: No new muscle or joint pain Hematologic: No bleeding or bruising Psychiatric: No history of depression or anxiety     Physical Exam:  Vital signs in last 24 hours: Temp:  [98.5 F (36.9 C)-99.2 F (37.3 C)] 98.6 F (37 C) (02/13 0720) Pulse Rate:  [68-119] 92 (02/13 0720) Resp:  [16-18] 16 (02/13 0720) BP: (128-172)/(87-135) 160/99 (02/13 0720) SpO2:  [94 %-100 %] 100 % (02/13 0720) Weight:  [79.4 kg] 79.4 kg (02/12 1224) Last BM Date : 08/13/23 Last BM recorded by nurses in past 5 days No data recorded  General:   Pleasant, well developed female in no acute distress Head:  Normocephalic and atraumatic.  Dry mucosa. Eyes: sclerae anicteric,conjunctive pink  Heart:  regular rate and rhythm, no murmurs or gallops Pulm: Clear anteriorly; no wheezing Abdomen:  Soft, Obese AB, nondistended Active bowel sounds. mild tenderness in the lower abdomen. Without guarding and Without rebound, No organomegaly appreciated. Extremities:  Without edema. Msk:  Symmetrical without gross deformities. Peripheral pulses intact.  Neurologic:  Alert and  oriented x4;  No focal deficits.  Skin:   Dry and intact without significant lesions or rashes. Psychiatric:  Cooperative. Normal mood and affect.  LAB RESULTS: Recent Labs    08/16/23 1355 08/17/23 0640  WBC 11.6* 9.4  HGB 11.9* 10.9*  HCT 38.3 35.4*  PLT 217 206   BMET Recent Labs    08/16/23 1355  NA 136  K 3.9  CL 101  CO2 25  GLUCOSE 145*  BUN 9  CREATININE 0.60  CALCIUM  9.1   LFT Recent Labs    08/16/23 1355  PROT 8.1  ALBUMIN 3.8  AST 31  ALT 30  ALKPHOS 93  BILITOT 0.8   PT/INR No results for input(s): "LABPROT", "INR" in the last 72 hours.  STUDIES: DG Abd 2 Views Result Date: 08/17/2023 CLINICAL DATA:  63 year old female with history of colonic stricture. EXAM: ABDOMEN - 2 VIEW COMPARISON:  No priors. FINDINGS: The bowel gas pattern is normal. There is no evidence of free air. No radio-opaque calculi or other significant radiographic abnormality is seen. IMPRESSION: Negative. Electronically Signed   By: Trudie Reed M.D.   On: 08/17/2023 06:20   MR THORACIC SPINE W WO CONTRAST Result Date: 08/16/2023 CLINICAL DATA:  Metastatic neuroendocrine tumor EXAM: MRI THORACIC WITHOUT AND WITH CONTRAST TECHNIQUE: Multiplanar and multiecho pulse sequences of the thoracic spine were obtained without and with intravenous contrast. CONTRAST:  8mL GADAVIST GADOBUTROL 1 MMOL/ML IV SOLN COMPARISON:  MRI 10/18/2019 FINDINGS: Alignment:  No malalignment. Vertebrae: No regional fracture. Marked worsening of widespread metastatic disease throughout the bones of the region including the spine and ribs. No pathologic fracture is seen. No visible extraosseous extension of tumor. Cord:  No primary cord lesion. Paraspinal and other soft tissues: Multiple T2 bright lesions seen throughout the liver. Disc levels: No disc herniation. There is marked facet and ligamentous hypertrophy at the T9-10 level, right more than left, with encroachment upon the spinal canal but no actual compression of the cord. IMPRESSION: 1. Marked worsening of widespread metastatic disease throughout the bones of the region including the spine and ribs. No pathologic fracture is seen. No visible extraosseous extension of tumor. 2. Multiple T2 bright lesions throughout the liver, consistent with metastatic disease. 3. Marked facet and ligamentous hypertrophy at the T9-10 level, right more than left, with  encroachment upon the spinal canal but no actual compression of the cord. Electronically Signed   By: Paulina Fusi M.D.   On: 08/16/2023 20:28   MR Lumbar Spine W Wo Contrast Result Date: 08/16/2023 CLINICAL DATA:  Low back pain. Cancer suspected. History of neuroendocrine tumor. EXAM: MRI LUMBAR SPINE WITHOUT AND WITH CONTRAST TECHNIQUE: Multiplanar and multiecho pulse sequences of the lumbar spine were obtained without and with intravenous contrast. CONTRAST:  8mL GADAVIST GADOBUTROL 1 MMOL/ML IV SOLN COMPARISON:  CT abdomen same day.  PET scan 07/31/2023. FINDINGS: Segmentation:  5 lumbar type vertebral bodies. Alignment:  Normal Vertebrae: No evidence regional fracture. The study shows complete marrow replacement by tumor throughout the skeletal system. There is no evidence of acute pathologic fracture. No extraosseous tumor extension is identified. Conus medullaris and cauda equina: Conus extends to the L1 level. Conus and cauda equina appear normal. Paraspinal and other soft tissues: Negative Disc levels: No significant disc level finding at L3-4 or above. L4-5: Mild bulging of the disc. Mild facet and ligamentous hypertrophy. No compressive stenosis. L5-S1: Moderate bulging of the disc. Mild facet and ligamentous hypertrophy. No compressive stenosis. IMPRESSION: 1. Widespread osseous metastatic disease throughout the regional osseous structures. No evidence of acute pathologic fracture. No extraosseous tumor extension. 2. L4-5: Mild bulging of the disc. Mild facet and ligamentous hypertrophy. No compressive stenosis. 3. L5-S1: Moderate bulging of the disc. Mild facet and ligamentous hypertrophy. No compressive stenosis. Electronically Signed  By: Paulina Fusi M.D.   On: 08/16/2023 20:21   CT ABDOMEN PELVIS W CONTRAST Result Date: 08/16/2023 CLINICAL DATA:  No bowel movements EXAM: CT ABDOMEN AND PELVIS WITH CONTRAST TECHNIQUE: Multidetector CT imaging of the abdomen and pelvis was performed using the  standard protocol following bolus administration of intravenous contrast. RADIATION DOSE REDUCTION: This exam was performed according to the departmental dose-optimization program which includes automated exposure control, adjustment of the mA and/or kV according to patient size and/or use of iterative reconstruction technique. CONTRAST:  OMNIPAQUE IOHEXOL 300 MG/ML  SOLN COMPARISON:  CT 08/26/2019, PET CT 07/31/2023, 06/07/2022 FINDINGS: Lower chest: Lung bases demonstrate no acute airspace disease. Than like areas of atelectasis at the right middle lobe and bilateral bases. Hepatobiliary: Numerous hypodense liver lesions, mostly subcentimeter, not clearly seen on more remote prior exams. Right hepatic lobe lesion on series 2, image 15 measures 15 mm, previously 11 mm on PET CT from January. No calcified gallstone or biliary dilatation Pancreas: Unremarkable. No pancreatic ductal dilatation or surrounding inflammatory changes. Spleen: Normal in size without focal abnormality. Adrenals/Urinary Tract: Adrenal glands are unremarkable. Kidneys are normal, without renal calculi, focal lesion, or hydronephrosis. Bladder is unremarkable. Stomach/Bowel: Stomach is nonenlarged. There is no dilated small bowel. Air and fecal distension of the colon measuring up to 7.3 cm, with relative caliber change at the descending colon. Focal narrowed area at the proximal descending colon on coronal series 8, image 85 through 90. Vascular/Lymphatic: Nonaneurysmal aorta.  No suspicious lymph nodes. Reproductive: Lobulated soft tissue masses, some with calcification measuring up to 5.2 cm, presumably representing uterine fibroids. Other: Negative for pelvic effusion or free air. Numerous small subcutaneous nodules within the posterior flank and gluteal regions. These were not hypermetabolic on recent PET CT and are unchanged. Musculoskeletal: Widespread skeletal metastatic disease. No acute osseous abnormality IMPRESSION: 1. Air and  fecal distension of the colon measuring up to 7.3 cm, with relative caliber change at the descending colon, suggesting a degree of colonic obstruction. Focal narrowed area at the proximal descending colon, possible stricture or mass, but some air and stool seen distal to this. Correlation with colonoscopy is suggested. 2. Numerous hypodense liver lesions, mostly subcentimeter, not clearly seen on more remote prior exams. Findings are suspicious for progressed metastatic disease. The lesion seen on recent PET CT in the right hepatic lobe appears slightly increased in size. 3. Widespread skeletal metastatic disease. 4. Uterine fibroids Electronically Signed   By: Jasmine Pang M.D.   On: 08/16/2023 18:08      Impression    Obstipation and abdominal pain with CT showing focal narrowing proximal descending colon with colonic obstruction, rule out stricture versus mass  Last colonoscopy 2021 with 5 diminutive polyps recall 11/20/2024 No nausea and vomiting at this time, tolerating clear liquid diet Given 1 dose MiraLAX 1 dose Senokot 2/12, 1 enema KUB 2/13 shows normal bowel gas pattern no free air and unremarkable  Metastatic neuroendocrine tumor with mets to bone, lymph nodes, liver Following with Dr. Leonides Schanz recent PET scan 1/27 showed progression of hepatic lobe and skeletal metastasis  Leukocytosis Afebrile likely reactive  Normocytic anemia No evidence of GI bleeding No IDA, normal iron and ferritin 08/01/2023  Principal Problem:   Colonic stricture (HCC) Active Problems:   Neuroendocrine carcinoma metastatic to multiple sites [facility])   Encounter for smoking cessation counseling    LOS: 1 day     Plan   Patient without nausea and vomiting no need for NG tube,  repeat KUB today showed good response to MiraLAX, Senokot and enema. Last colonoscopy 2021 with diminutive polyps no evidence of stricture or reoccurrence of NET at that time With CT scan we will plan on colonoscopy to evaluate  for potential reoccurrence NET versus malignant stricture versus severe constipation/dysmotility - continue supprotive care -Please obtain early AM CBC, CMET, INR -Will plan for tentative Colonoscopy on 02/14 if patient is unable to prep can consider switching to a flexible sigmoidoscopy -Will have patient sip on GoLytely today with clear liquids then plan on doing full movie prep tonight starting at around 6 or 7 PM split dose with second dose around 4 AM, n.p.o. at 5 AM -Give 10 mg PO Dulcolax today, can repeat enemas in the morning  We have discussed the risks of bleeding, infection, perforation, medication reactions, and remote risk of death associated with colonoscopy. All questions were answered and the patient acknowledges these risk and wishes to proceed.,  Thank you for your kind consultation, we will continue to follow.   Doree Albee  08/17/2023, 7:45 AM

## 2023-08-18 ENCOUNTER — Inpatient Hospital Stay (HOSPITAL_COMMUNITY): Payer: 59 | Admitting: Certified Registered"

## 2023-08-18 ENCOUNTER — Encounter (HOSPITAL_COMMUNITY)
Admission: EM | Disposition: A | Discharge status: Home / Self Care | Payer: Self-pay | Source: Home / Self Care | Attending: Emergency Medicine

## 2023-08-18 ENCOUNTER — Encounter (HOSPITAL_COMMUNITY): Payer: Self-pay | Admitting: Internal Medicine

## 2023-08-18 DIAGNOSIS — R933 Abnormal findings on diagnostic imaging of other parts of digestive tract: Secondary | ICD-10-CM

## 2023-08-18 DIAGNOSIS — D123 Benign neoplasm of transverse colon: Secondary | ICD-10-CM

## 2023-08-18 DIAGNOSIS — D175 Benign lipomatous neoplasm of intra-abdominal organs: Secondary | ICD-10-CM | POA: Diagnosis not present

## 2023-08-18 DIAGNOSIS — I10 Essential (primary) hypertension: Secondary | ICD-10-CM

## 2023-08-18 DIAGNOSIS — K648 Other hemorrhoids: Secondary | ICD-10-CM | POA: Diagnosis not present

## 2023-08-18 DIAGNOSIS — K5730 Diverticulosis of large intestine without perforation or abscess without bleeding: Secondary | ICD-10-CM

## 2023-08-18 DIAGNOSIS — K644 Residual hemorrhoidal skin tags: Secondary | ICD-10-CM

## 2023-08-18 DIAGNOSIS — K5900 Constipation, unspecified: Secondary | ICD-10-CM | POA: Diagnosis not present

## 2023-08-18 DIAGNOSIS — K56699 Other intestinal obstruction unspecified as to partial versus complete obstruction: Secondary | ICD-10-CM | POA: Diagnosis not present

## 2023-08-18 HISTORY — PX: POLYPECTOMY: SHX5525

## 2023-08-18 HISTORY — PX: COLONOSCOPY WITH PROPOFOL: SHX5780

## 2023-08-18 LAB — CBC
HCT: 32.6 % — ABNORMAL LOW (ref 36.0–46.0)
Hemoglobin: 10.2 g/dL — ABNORMAL LOW (ref 12.0–15.0)
MCH: 26.8 pg (ref 26.0–34.0)
MCHC: 31.3 g/dL (ref 30.0–36.0)
MCV: 85.6 fL (ref 80.0–100.0)
Platelets: 197 10*3/uL (ref 150–400)
RBC: 3.81 MIL/uL — ABNORMAL LOW (ref 3.87–5.11)
RDW: 14.7 % (ref 11.5–15.5)
WBC: 9.5 10*3/uL (ref 4.0–10.5)
nRBC: 0 % (ref 0.0–0.2)

## 2023-08-18 LAB — COMPREHENSIVE METABOLIC PANEL
ALT: 24 U/L (ref 0–44)
AST: 19 U/L (ref 15–41)
Albumin: 3.2 g/dL — ABNORMAL LOW (ref 3.5–5.0)
Alkaline Phosphatase: 72 U/L (ref 38–126)
Anion gap: 10 (ref 5–15)
BUN: <5 mg/dL — ABNORMAL LOW (ref 8–23)
CO2: 23 mmol/L (ref 22–32)
Calcium: 8.5 mg/dL — ABNORMAL LOW (ref 8.9–10.3)
Chloride: 108 mmol/L (ref 98–111)
Creatinine, Ser: 0.36 mg/dL — ABNORMAL LOW (ref 0.44–1.00)
GFR, Estimated: >60 mL/min (ref >60)
Glucose, Bld: 146 mg/dL — ABNORMAL HIGH (ref 70–99)
Potassium: 3.6 mmol/L (ref 3.5–5.1)
Sodium: 141 mmol/L (ref 135–145)
Total Bilirubin: 0.7 mg/dL (ref 0.0–1.2)
Total Protein: 6.6 g/dL (ref 6.5–8.1)

## 2023-08-18 LAB — MAGNESIUM: Magnesium: 1.8 mg/dL (ref 1.7–2.4)

## 2023-08-18 LAB — PHOSPHORUS: Phosphorus: 2.3 mg/dL — ABNORMAL LOW (ref 2.5–4.6)

## 2023-08-18 SURGERY — COLONOSCOPY WITH PROPOFOL
Anesthesia: Monitor Anesthesia Care

## 2023-08-18 MED ORDER — SODIUM CHLORIDE 0.9 % IV SOLN: INTRAVENOUS | Status: DC | PRN

## 2023-08-18 MED ORDER — POLYETHYLENE GLYCOL 3350 17 G PO PACK: 17.0000 g | PACK | Freq: Two times a day (BID) | ORAL | 0 refills | Status: AC

## 2023-08-18 MED ORDER — PROPOFOL 500 MG/50ML IV EMUL
INTRAVENOUS | Status: DC | PRN
Start: 2023-08-18 — End: 2023-08-18
  Administered 2023-08-18 (×2): 50 mg via INTRAVENOUS
  Administered 2023-08-18: 100 ug/kg/min via INTRAVENOUS

## 2023-08-18 MED ORDER — PHENYLEPHRINE HCL (PRESSORS) 10 MG/ML IV SOLN
INTRAVENOUS | Status: DC | PRN
  Administered 2023-08-18: 100 ug via INTRAVENOUS

## 2023-08-18 MED ORDER — POTASSIUM PHOSPHATES 15 MMOLE/5ML IV SOLN
15.0000 mmol | Freq: Once | INTRAVENOUS | Status: DC
  Filled 2023-08-18: qty 5

## 2023-08-18 MED ORDER — BISACODYL 5 MG PO TBEC
10.0000 mg | DELAYED_RELEASE_TABLET | Freq: Once | ORAL | Status: AC
  Administered 2023-08-18: 10 mg via ORAL
  Filled 2023-08-18: qty 2

## 2023-08-18 MED ORDER — PROPOFOL 10 MG/ML IV BOLUS
INTRAVENOUS | Status: AC
  Filled 2023-08-18: qty 20

## 2023-08-18 MED ORDER — FLEET ENEMA RE ENEM
1.0000 | ENEMA | RECTAL | Status: AC
  Administered 2023-08-18 (×2): 1 via RECTAL
  Filled 2023-08-18 (×2): qty 1

## 2023-08-18 MED ORDER — PEG-KCL-NACL-NASULF-NA ASC-C 100 G PO SOLR
0.5000 | Freq: Once | ORAL | Status: AC
  Administered 2023-08-18: 100 g via ORAL
  Filled 2023-08-18: qty 1

## 2023-08-18 SURGICAL SUPPLY — 20 items

## 2023-08-18 NOTE — Transfer of Care (Signed)
Immediate Anesthesia Transfer of Care Note  Patient: Michelle Solomon  Procedure(s) Performed: COLONOSCOPY WITH PROPOFOL POLYPECTOMY  Patient Location: Endoscopy Unit  Anesthesia Type:MAC and General  Level of Consciousness: awake and alert   Airway & Oxygen Therapy: Patient Spontanous Breathing and Patient connected to nasal cannula oxygen  Post-op Assessment: Report given to RN and Post -op Vital signs reviewed and stable  Post vital signs: Reviewed and stable  Last Vitals:  Vitals Value Taken Time  BP    Temp    Pulse    Resp    SpO2      Last Pain:  Vitals:   08/18/23 1208  TempSrc: Temporal  PainSc: 0-No pain         Complications: No notable events documented.

## 2023-08-18 NOTE — Op Note (Signed)
[facility] Patient Name: Neenah Canter Procedure Date: 08/18/2023 MRN: [id number] Attending MD: Willaim Rayas. Adela Lank , MD, [id number] Date of Birth: April 13, 1961 CSN: [id number] Age: 63 Admit Type: Inpatient Procedure:                Colonoscopy Indications:              Abnormal CT of the GI tract - stricture vs. mass                            lesion descending colon, admitted with obstipation.                            Patient drank 2-3 bowel preps, finally cleared her                            colon, feels significantly improved Providers:                Willaim Rayas. Adela Lank, MD, Fransisca Connors, Rhodia Albright, Technician Referring MD:              Medicines:                Monitored Anesthesia Care Complications:            No immediate complications. Estimated blood loss:                            Minimal. Estimated Blood Loss:     Estimated blood loss was minimal. Procedure:                Pre-Anesthesia Assessment:                           - Prior to the procedure, a History and Physical                            was performed, and patient medications and                            allergies were reviewed. The patient's tolerance of                            previous anesthesia was also reviewed. The risks                            and benefits of the procedure and the sedation                            options and risks were discussed with the patient.                            All questions were answered, and informed consent  was obtained. Prior Anticoagulants: The patient has                            taken no anticoagulant or antiplatelet agents. ASA                            Grade Assessment: III - A patient with severe                            systemic disease. After reviewing the risks and                            benefits, the patient was deemed in satisfactory                             condition to undergo the procedure.                           After obtaining informed consent, the colonoscope                            was passed under direct vision. Throughout the                            procedure, the patient's blood pressure, pulse, and                            oxygen saturations were monitored continuously. The                            [id number]-HQ190L ([id number] Olympus colonoscope was                            introduced through the anus and advanced to the the                            cecum, identified by appendiceal orifice and                            ileocecal valve. The colonoscopy was performed                            without difficulty. The patient tolerated the                            procedure well. The quality of the bowel                            preparation was adequate. The ileocecal valve,                            appendiceal orifice, and rectum were photographed. Scope In: 1:22:30 PM Scope Out: 1:38:31 PM Scope Withdrawal Time: 0 hours 8 minutes 54 seconds  Total Procedure Duration: 0 hours  16 minutes 1 second  Findings:      Skin tags were found on perianal exam.      A 3 mm polyp was found in the transverse colon. The polyp was sessile.       The polyp was removed with a cold snare. Resection and retrieval were       complete.      There was a medium-sized lipoma, in the ascending colon.      A few small-mouthed diverticula were found in the ascending colon.      Internal hemorrhoids were found during retroflexion.      The exam was otherwise without abnormality. No stenosis / stricture or       mass lesion in the left colon in regards to CT scan findings. Residual       liquid stool in the colon which led to lavage with good visualization. Impression:               - Perianal skin tags found on perianal exam.                           - One 3 mm polyp in the transverse colon, removed                            with a cold  snare. Resected and retrieved.                           - Medium-sized lipoma in the ascending colon.                           - Diverticulosis in the ascending colon.                           - Internal hemorrhoids.                           - The examination was otherwise normal. Moderate Sedation:      No moderate sedation, case performed with MAC Recommendation:           - Return patient to hospital ward for ongoing care.                           - Advance diet as tolerated.                           - Continue present medications.                           - Okay for patient to discharge home from our                            perspective                           - Miralax twice daily at home and titrate up as                            needed for goal BM  daily                           - If this regimen is not working please call the                            office to discus alternatives.                           - Await pathology results. Procedure Code(s):        --- Professional ---                           [phone], Colonoscopy, flexible; with removal of                            tumor(s), polyp(s), or other lesion(s) by snare                            technique Diagnosis Code(s):        --- Professional ---                           D12.3, Benign neoplasm of transverse colon (hepatic                            flexure or splenic flexure)                           D17.5, Benign lipomatous neoplasm of                            intra-abdominal organs                           K64.8, Other hemorrhoids                           K64.4, Residual hemorrhoidal skin tags                           K57.30, Diverticulosis of large intestine without                            perforation or abscess without bleeding                           R93.3, Abnormal findings on diagnostic imaging of                            other parts of digestive tract CPT copyright 2022 American  Medical Association. All rights reserved. The codes documented in this report are preliminary and upon coder review may  be revised to meet current compliance requirements. Viviann Spare P. Serrena Linderman, MD 08/18/2023 1:47:03 PM This report has been signed electronically. Number of Addenda: 0

## 2023-08-18 NOTE — Plan of Care (Signed)
Discharge instructions given to the patient including medications and follow up.

## 2023-08-18 NOTE — Anesthesia Postprocedure Evaluation (Signed)
Anesthesia Post Note  Patient: Michelle Solomon  Procedure(s) Performed: COLONOSCOPY WITH PROPOFOL POLYPECTOMY     Patient location during evaluation: PACU Anesthesia Type: MAC Level of consciousness: awake and alert Pain management: pain level controlled Vital Signs Assessment: post-procedure vital signs reviewed and stable Respiratory status: spontaneous breathing, nonlabored ventilation, respiratory function stable and patient connected to nasal cannula oxygen Cardiovascular status: stable and blood pressure returned to baseline Postop Assessment: no apparent nausea or vomiting Anesthetic complications: no   No notable events documented.  Last Vitals:  Vitals:   08/18/23 1429 08/18/23 1452  BP: (!) 157/112 (!) 184/99  Pulse: 95 94  Resp: 16 17  Temp: 36.7 C 36.8 C  SpO2: 100% 100%    Last Pain:  Vitals:   08/18/23 1452  TempSrc: Oral  PainSc:                  [place]

## 2023-08-18 NOTE — Progress Notes (Deleted)
PROGRESS NOTE  Michelle Solomon ZOX:[id number] DOB: [birth year] DOA: 08/16/2023 PCP: Corwin Levins, MD   LOS: 2 days   Brief Narrative / Interim history: 63 year old female with history of metastatic neuroendocrine tumor with mets to the bone, lymph nodes, liver, HTN, comes into the hospital with lower abdominal discomfort, lack of bowel movements over the last several days along with nausea.  She also has been having flank pain.  Prior to these her bowel movements were normal, she denies any blood in her stool or melena.  Last colonoscopy was 2021 with polyps that were resected.  Workup in the ED was concerning for chronic obstruction in the descending colon possible stricture or mass.  GI was consulted, and she was admitted to the hospital.  Subjective / 24h Interval events: Doing well today.  Undergoing bowel prep.  Scheduled for colonoscopy this afternoon  Assesement and Plan: Principal Problem:   Colonic stricture [facility]) Active Problems:   Neuroendocrine carcinoma metastatic to multiple sites [facility])   Encounter for smoking cessation counseling   Obstipation   Abnormal CT scan, gastrointestinal tract  Principal problem Partial large bowel obstruction, descending colon -CT scan raising concern for colonic stricture versus mass.  She has a history of a neuroendocrine tumor and had colon surgery 20+ years ago.  Gastroenterology consulted on admission, appreciate input.  Plan for colonoscopy this afternoon, next step will be decided based on results -Continue supportive care for now, she has no significant nausea, vomiting  Active problems Stage IV neuroendocrine tumor -with metastasis to the bone, lymph nodes, liver.  Looks like liver mets seem to be new.  Currently getting treatment and follows with Dr. Leonides Schanz with oncology as an outpatient.  Imaging in the ED showed worsening bony metastasis and as above new onset liver mets -Dr. Leonides Schanz aware that she is hospitalized  Essential  hypertension-continue home losartan  Hypophosphatemia-replace phosphorus again today  History of asthma-no wheezing, this is stable  History of mood disorder-continue Wellbutrin  Tobacco use-2 cigarettes a day.  She is trying to quit  Obesity, class I-noted  Scheduled Meds:  buPROPion  300 mg Oral Daily   losartan  25 mg Oral Daily   Continuous Infusions:  potassium PHOSPHATE IVPB (in mmol)     PRN Meds:.acetaminophen, bisacodyl, HYDROmorphone (DILAUDID) injection, nicotine polacrilex, ondansetron (ZOFRAN) IV, oxyCODONE **OR** oxyCODONE  Current Outpatient Medications  Medication Instructions   buPROPion (WELLBUTRIN XL) 300 MG 24 hr tablet TAKE 1 TABLET(300 MG) BY MOUTH DAILY   cholecalciferol (VITAMIN D3) 1,000 Units, Daily   ferrous sulfate 325 mg, Oral, Daily with breakfast, Please take with a source of Vitamin C   GARLIC PO 1 tablet, Oral, Daily   GINKGO BILOBA EXTRACT PO 1 tablet, Oral, Daily   hydrochlorothiazide (HYDRODIURIL) 12.5 MG tablet TAKE 1 TABLET(12.5 MG) BY MOUTH DAILY   lanreotide acetate (SOMATULINE DEPOT) 120 mg, Subcutaneous, Every 28 days   loratadine (CLARITIN) 10 mg, Daily PRN   losartan (COZAAR) 50 MG tablet TAKE 1 TABLET(50 MG) BY MOUTH DAILY   ondansetron (ZOFRAN) 8 mg, Oral, 2 times daily PRN   oxyCODONE-acetaminophen (PERCOCET/ROXICET) 5-325 MG tablet 1 tablet, Oral, Every 6 hours PRN   Turmeric 500 mg, Daily   VITAMIN D-VITAMIN K PO 1 tablet, Daily   zolpidem (AMBIEN) 10 mg, Oral, At bedtime PRN    Diet Orders (From admission, onward)     Start     Ordered   08/18/23 1100  Diet NPO time specified Except for: Sips with Meds  Diet effective ____       Question:  Except for  Answer:  Sips with Meds   08/18/23 0905            DVT prophylaxis: SCDs Start: 08/16/23 2117   Lab Results  Component Value Date   PLT 197 08/18/2023      Code Status: Full Code  Family Communication: Sister present at bedside  Status is:  Inpatient Remains inpatient appropriate because: Large intestinal obstruction  Level of care: Med-Surg  Consultants:  Gastroenterology  Objective: Vitals:   08/18/23 0134 08/18/23 0514 08/18/23 0730 08/18/23 0738  BP: (!) 142/100 (!) 154/100  (!) 147/104  Pulse: 90 (!) 101  92  Resp: 15 16  15   Temp: 98 F (36.7 C) 97.7 F (36.5 C) 97.6 F (36.4 C) 97.6 F (36.4 C)  TempSrc: Oral Oral Oral Oral  SpO2: 99% 99%  100%  Weight:      Height:        Intake/Output Summary (Last 24 hours) at 08/18/2023 1145 Last data filed at 08/18/2023 0600 Gross per 24 hour  Intake 1269.52 ml  Output 0 ml  Net 1269.52 ml   Wt Readings from Last 3 Encounters:  08/16/23 79.4 kg  08/08/23 79.4 kg  07/04/23 80 kg    Examination:  Constitutional: NAD Eyes: lids and conjunctivae normal, no scleral icterus ENMT: mmm Neck: normal, supple Respiratory: clear to auscultation bilaterally, no wheezing, no crackles. Normal respiratory effort.  Cardiovascular: Regular rate and rhythm, no murmurs / rubs / gallops. No LE edema. Abdomen: soft, no distention, no tenderness. Bowel sounds positive.    Data Reviewed: I have independently reviewed following labs and imaging studies   CBC Recent Labs  Lab 08/16/23 1355 08/17/23 0640 08/18/23 0333  WBC 11.6* 9.4 9.5  HGB 11.9* 10.9* 10.2*  HCT 38.3 35.4* 32.6*  PLT 217 206 197  MCV 85.5 86.3 85.6  MCH 26.6 26.6 26.8  MCHC 31.1 30.8 31.3  RDW 14.6 14.7 14.7  LYMPHSABS 2.3  --   --   MONOABS 1.2*  --   --   EOSABS 0.0  --   --   BASOSABS 0.0  --   --     Recent Labs  Lab 08/16/23 1355 08/17/23 0640 08/18/23 0333  NA 136 137 141  K 3.9 3.8 3.6  CL 101 102 108  CO2 25 25 23   GLUCOSE 145* 134* 146*  BUN 9 9 <5*  CREATININE 0.60 0.43* 0.36*  CALCIUM 9.1 9.0 8.5*  AST 31  --  19  ALT 30  --  24  ALKPHOS 93  --  72  BILITOT 0.8  --  0.7  ALBUMIN 3.8  --  3.2*  MG  --  2.1 1.8  INR  --  1.1  --      ------------------------------------------------------------------------------------------------------------------ No results for input(s): "CHOL", "HDL", "LDLCALC", "TRIG", "CHOLHDL", "LDLDIRECT" in the last 72 hours.  Lab Results  Component Value Date   HGBA1C 6.7 (H) 04/13/2022   ------------------------------------------------------------------------------------------------------------------ No results for input(s): "TSH", "T4TOTAL", "T3FREE", "THYROIDAB" in the last 72 hours.  Invalid input(s): "FREET3"  Cardiac Enzymes No results for input(s): "CKMB", "TROPONINI", "MYOGLOBIN" in the last 168 hours.  Invalid input(s): "CK" ------------------------------------------------------------------------------------------------------------------ No results found for: "BNP"  CBG: No results for input(s): "GLUCAP" in the last 168 hours.  Recent Results (from the past 240 hours)  Blood culture (routine x 2)     Status: None (Preliminary result)   Collection Time: 08/16/23  1:55 PM   Specimen: BLOOD  Result Value Ref Range Status   Specimen Description   Final    BLOOD SITE NOT SPECIFIED Performed at [facility], [address] [place], [place] [address]    Special Requests   Final    BOTTLES DRAWN AEROBIC AND ANAEROBIC Blood Culture adequate volume Performed at [facility], [address] [place], [place] [address]    Culture   Final    NO GROWTH 2 DAYS Performed at [facility], [address], [place], [place] [address]    Report Status PENDING  Incomplete     Radiology Studies: No results found.    Pamella Pert, MD, PhD [facility]  Between 7 am - 7 pm I am available, please contact me via Amion (for emergencies) or Securechat (non urgent messages)  Between 7 pm - 7 am I am not available, please contact night coverage MD/APP via Amion

## 2023-08-18 NOTE — Discharge Summary (Signed)
Physician Discharge Summary  Michelle Solomon:[id number] DOB: 1960/07/27 DOA: 08/16/2023  PCP: Corwin Levins, MD  Admit date: 08/16/2023 Discharge date: 08/18/2023  Admitted From: home Disposition:  home  Recommendations for Outpatient Follow-up:  Follow up with PCP in 1-2 weeks  Home Health: none Equipment/Devices: none  Discharge Condition: stable CODE STATUS: Full code Diet Orders (From admission, onward)     Start     Ordered   08/18/23 1431  Diet regular Fluid consistency: Thin  Diet effective now       Question:  Fluid consistency:  Answer:  Thin   08/18/23 1430            HPI: Per admitting MD, Michelle Solomon is a 63 y.o. female with hx of metastatic neuroendocrine tumor with mets to bone, lymph nodes, liver, hypertension, asthma, who presented with symptoms of obstipation and lower abdominal pain.  Reports last BM was on Saturday and normally has a bowel movement every other day.  Noted that has not been passing gas since Monday.  She has associated flank and lower abdominal pain.  Abdominal pain has become more constant.  No nausea, vomiting.  Previously bowel movements were normal, no hematochezia or melena.  No history of abdominal surgeries.  Last colonoscopy included below in 2021 with polyps that were resected.   Hospital Course / Discharge diagnoses: Principal Problem:   Colonic stricture [facility]) Active Problems:   Neuroendocrine carcinoma metastatic to multiple sites [facility])   Encounter for smoking cessation counseling   Obstipation   Abnormal CT scan, gastrointestinal tract   Benign neoplasm of transverse colon   Principal problem Partial large bowel obstruction, descending colon -CT scan raising concern for colonic stricture versus mass.  She has a history of a neuroendocrine tumor and had colon surgery 20+ years ago.  Gastroenterology consulted and followed patient while hospitalized.  She was taken to the endoscopy suite on 08/18/2023, and no strictures  or tumors were found, examination was fairly unremarkable and he was believed to be related to severe constipation.  This resolved after bowel prep.  Given no significant pathology found, she will be discharged home in stable condition on a daily bowel regimen per GI.   Active problems Stage IV neuroendocrine tumor -with metastasis to the bone, lymph nodes, liver.  Looks like liver mets seem to be new.  Currently getting treatment and follows with Dr. Leonides Schanz with oncology as an outpatient.  Imaging in the ED showed worsening bony metastasis and as above new onset liver mets. Dr. Leonides Schanz aware that she is hospitalized, continue outpatient follow-up  Essential hypertension-continue home losartan Hypophosphatemia-replaced History of asthma-no wheezing, this is stable History of mood disorder-continue Wellbutrin Tobacco use-2 cigarettes a day.  She is trying to quit Obesity, class I-noted  Sepsis ruled out   Discharge Instructions   Allergies as of 08/18/2023       Reactions   Shellfish Allergy Anaphylaxis        Medication List     TAKE these medications    buPROPion 300 MG 24 hr tablet Commonly known as: WELLBUTRIN XL TAKE 1 TABLET(300 MG) BY MOUTH DAILY   cholecalciferol 25 MCG (1000 UNIT) tablet Commonly known as: VITAMIN D3 Take 1,000 Units by mouth daily.   ferrous sulfate 325 (65 FE) MG tablet Take 1 tablet (325 mg total) by mouth daily with breakfast. Please take with a source of Vitamin C   GARLIC PO Take 1 tablet by mouth daily.   GINKGO BILOBA EXTRACT  PO Take 1 tablet by mouth daily.   hydrochlorothiazide 12.5 MG tablet Commonly known as: HYDRODIURIL TAKE 1 TABLET(12.5 MG) BY MOUTH DAILY   lanreotide acetate 120 MG/0.5ML injection Commonly known as: Somatuline Depot Inject 120 mg into the skin every 28 (twenty-eight) days.   loratadine 10 MG tablet Commonly known as: CLARITIN Take 10 mg by mouth daily as needed for allergies.   losartan 50 MG  tablet Commonly known as: COZAAR TAKE 1 TABLET(50 MG) BY MOUTH DAILY   ondansetron 8 MG tablet Commonly known as: ZOFRAN Take 1 tablet (8 mg total) by mouth 2 (two) times daily as needed for nausea or vomiting.   oxyCODONE-acetaminophen 5-325 MG tablet Commonly known as: PERCOCET/ROXICET Take 1 tablet by mouth every 6 (six) hours as needed for severe pain.   polyethylene glycol 17 g packet Commonly known as: MiraLax Take 17 g by mouth 2 (two) times daily.   Turmeric 500 MG Caps Take 500 mg by mouth daily.   VITAMIN D-VITAMIN K PO Take 1 tablet by mouth daily.   zolpidem 10 MG tablet Commonly known as: AMBIEN Take 1 tablet (10 mg total) by mouth at bedtime as needed for sleep.       Consultations: GI  Procedures/Studies:  DG Abd 2 Views Result Date: 08/17/2023 CLINICAL DATA:  63 year old female with history of colonic stricture. EXAM: ABDOMEN - 2 VIEW COMPARISON:  No priors. FINDINGS: The bowel gas pattern is normal. There is no evidence of free air. No radio-opaque calculi or other significant radiographic abnormality is seen. IMPRESSION: Negative. Electronically Signed   By: Trudie Reed M.D.   On: 08/17/2023 06:20   MR THORACIC SPINE W WO CONTRAST Result Date: 08/16/2023 CLINICAL DATA:  Metastatic neuroendocrine tumor EXAM: MRI THORACIC WITHOUT AND WITH CONTRAST TECHNIQUE: Multiplanar and multiecho pulse sequences of the thoracic spine were obtained without and with intravenous contrast. CONTRAST:  8mL GADAVIST GADOBUTROL 1 MMOL/ML IV SOLN COMPARISON:  MRI 10/18/2019 FINDINGS: Alignment:  No malalignment. Vertebrae: No regional fracture. Marked worsening of widespread metastatic disease throughout the bones of the region including the spine and ribs. No pathologic fracture is seen. No visible extraosseous extension of tumor. Cord:  No primary cord lesion. Paraspinal and other soft tissues: Multiple T2 bright lesions seen throughout the liver. Disc levels: No disc  herniation. There is marked facet and ligamentous hypertrophy at the T9-10 level, right more than left, with encroachment upon the spinal canal but no actual compression of the cord. IMPRESSION: 1. Marked worsening of widespread metastatic disease throughout the bones of the region including the spine and ribs. No pathologic fracture is seen. No visible extraosseous extension of tumor. 2. Multiple T2 bright lesions throughout the liver, consistent with metastatic disease. 3. Marked facet and ligamentous hypertrophy at the T9-10 level, right more than left, with encroachment upon the spinal canal but no actual compression of the cord. Electronically Signed   By: Paulina Fusi M.D.   On: 08/16/2023 20:28   MR Lumbar Spine W Wo Contrast Result Date: 08/16/2023 CLINICAL DATA:  Low back pain. Cancer suspected. History of neuroendocrine tumor. EXAM: MRI LUMBAR SPINE WITHOUT AND WITH CONTRAST TECHNIQUE: Multiplanar and multiecho pulse sequences of the lumbar spine were obtained without and with intravenous contrast. CONTRAST:  8mL GADAVIST GADOBUTROL 1 MMOL/ML IV SOLN COMPARISON:  CT abdomen same day.  PET scan 07/31/2023. FINDINGS: Segmentation:  5 lumbar type vertebral bodies. Alignment:  Normal Vertebrae: No evidence regional fracture. The study shows complete marrow replacement by tumor throughout  the skeletal system. There is no evidence of acute pathologic fracture. No extraosseous tumor extension is identified. Conus medullaris and cauda equina: Conus extends to the L1 level. Conus and cauda equina appear normal. Paraspinal and other soft tissues: Negative Disc levels: No significant disc level finding at L3-4 or above. L4-5: Mild bulging of the disc. Mild facet and ligamentous hypertrophy. No compressive stenosis. L5-S1: Moderate bulging of the disc. Mild facet and ligamentous hypertrophy. No compressive stenosis. IMPRESSION: 1. Widespread osseous metastatic disease throughout the regional osseous structures. No  evidence of acute pathologic fracture. No extraosseous tumor extension. 2. L4-5: Mild bulging of the disc. Mild facet and ligamentous hypertrophy. No compressive stenosis. 3. L5-S1: Moderate bulging of the disc. Mild facet and ligamentous hypertrophy. No compressive stenosis. Electronically Signed   By: Paulina Fusi M.D.   On: 08/16/2023 20:21   CT ABDOMEN PELVIS W CONTRAST Result Date: 08/16/2023 CLINICAL DATA:  No bowel movements EXAM: CT ABDOMEN AND PELVIS WITH CONTRAST TECHNIQUE: Multidetector CT imaging of the abdomen and pelvis was performed using the standard protocol following bolus administration of intravenous contrast. RADIATION DOSE REDUCTION: This exam was performed according to the departmental dose-optimization program which includes automated exposure control, adjustment of the mA and/or kV according to patient size and/or use of iterative reconstruction technique. CONTRAST:  OMNIPAQUE IOHEXOL 300 MG/ML  SOLN COMPARISON:  CT 08/26/2019, PET CT 07/31/2023, 06/07/2022 FINDINGS: Lower chest: Lung bases demonstrate no acute airspace disease. Than like areas of atelectasis at the right middle lobe and bilateral bases. Hepatobiliary: Numerous hypodense liver lesions, mostly subcentimeter, not clearly seen on more remote prior exams. Right hepatic lobe lesion on series 2, image 15 measures 15 mm, previously 11 mm on PET CT from January. No calcified gallstone or biliary dilatation Pancreas: Unremarkable. No pancreatic ductal dilatation or surrounding inflammatory changes. Spleen: Normal in size without focal abnormality. Adrenals/Urinary Tract: Adrenal glands are unremarkable. Kidneys are normal, without renal calculi, focal lesion, or hydronephrosis. Bladder is unremarkable. Stomach/Bowel: Stomach is nonenlarged. There is no dilated small bowel. Air and fecal distension of the colon measuring up to 7.3 cm, with relative caliber change at the descending colon. Focal narrowed area at the proximal  descending colon on coronal series 8, image 85 through 90. Vascular/Lymphatic: Nonaneurysmal aorta.  No suspicious lymph nodes. Reproductive: Lobulated soft tissue masses, some with calcification measuring up to 5.2 cm, presumably representing uterine fibroids. Other: Negative for pelvic effusion or free air. Numerous small subcutaneous nodules within the posterior flank and gluteal regions. These were not hypermetabolic on recent PET CT and are unchanged. Musculoskeletal: Widespread skeletal metastatic disease. No acute osseous abnormality IMPRESSION: 1. Air and fecal distension of the colon measuring up to 7.3 cm, with relative caliber change at the descending colon, suggesting a degree of colonic obstruction. Focal narrowed area at the proximal descending colon, possible stricture or mass, but some air and stool seen distal to this. Correlation with colonoscopy is suggested. 2. Numerous hypodense liver lesions, mostly subcentimeter, not clearly seen on more remote prior exams. Findings are suspicious for progressed metastatic disease. The lesion seen on recent PET CT in the right hepatic lobe appears slightly increased in size. 3. Widespread skeletal metastatic disease. 4. Uterine fibroids Electronically Signed   By: Jasmine Pang M.D.   On: 08/16/2023 18:08   NM PET DOTATATE SKULL BASE TO MID THIGH Result Date: 07/31/2023 CLINICAL DATA:  Well differentiated neuroendocrine tumor. Prior peptide receptor radiotherapy completed March 2023. Evidence of skeletal metastasis on most recent  DOTATATE PET scan as well as a lesion in the medial RIGHT breast. EXAM: NUCLEAR MEDICINE PET SKULL BASE TO THIGH TECHNIQUE: 4.2 mCi copper 64 DOTATATE was injected intravenously. Full-ring PET imaging was performed from the skull base to thigh after the radiotracer. CT data was obtained and used for attenuation correction and anatomic localization. COMPARISON:  DOTATATE PET scan 02/08/2023 FINDINGS: NECK No radiotracer avid nodes  in the neck. Incidental CT findings: None CHEST No radiotracer accumulation within mediastinal or hilar lymph nodes. No suspicious pulmonary nodules on the CT scan. Previous described lesion in the medial RIGHT breast measures 8 mm with SUV max equal 23 compared to 4 mm with SUV max equal 11.5. Incidental CT finding:None ABDOMEN/PELVIS There is a new solitary lesion radiotracer activity within the RIGHT hepatic lobe with SUV max equal 13.0 on image 79. Chest is subtle low-density lesion identified on CT portion exam measuring 12 mm on image 79/series 4) intense activity through the pancreas is similar prior. No radiotracer avid abdominopelvic lymph nodes. No mesenteric lesion. No abnormal activity in the bowel. Physiologic activity noted in the liver, spleen, adrenal glands and kidneys. Incidental CT findings:None SKELETON Overall there is interval increase in the number of radiotracer avid skeletal metastasis. For example in the posterior RIGHT iliac bone broader lesion with SUV max equal 34 compared SUV max 11.4 prior. Lesions in the proximal femurs appear multiple fluid. Several new lesions in the ribs and sternum. Iliac vertebral body is involved with malignancy radiotracer avid number tumor. Of the widespread sclerotic lesions on the CT portion exam. Incidental CT findings:None IMPRESSION: 1. Overall mild progression of multifocal skeletal metastasis involving the axillary and and appendicular skeleton. These intense radiotracer avid consistent well differentiated tumor. 2. New lesion in the RIGHT hepatic lobe consistent with progressive hepatic metastasis. 3. Interval increase in size of radiotracer avid nodule in the medial RIGHT upper chest. 4. Patient may be a good candidate for re-treatment with the Lu 177 DOTATATE after initial positive response. Electronically Signed   By: Genevive Bi M.D.   On: 07/31/2023 17:03     Subjective: - no chest pain, shortness of breath, no abdominal pain, nausea or  vomiting.   Discharge Exam: BP (!) 157/112 (BP Location: Right Arm)   Pulse 95   Temp 98.1 F (36.7 C) (Oral)   Resp 16   Ht 5\' 3"  (1.6 m)   Wt 79.4 kg   LMP 10/26/2017   SpO2 100%   BMI 31.00 kg/m   General: Pt is alert, awake, not in acute distress Cardiovascular: RRR, S1/S2 +, no rubs, no gallops Respiratory: CTA bilaterally, no wheezing, no rhonchi Abdominal: Soft, NT, ND, bowel sounds + Extremities: no edema, no cyanosis    The results of significant diagnostics from this hospitalization (including imaging, microbiology, ancillary and laboratory) are listed below for reference.     Microbiology: Recent Results (from the past 240 hours)  Blood culture (routine x 2)     Status: None (Preliminary result)   Collection Time: 08/16/23  1:55 PM   Specimen: BLOOD  Result Value Ref Range Status   Specimen Description   Final    BLOOD SITE NOT SPECIFIED Performed at [facility], [address], [place], [place] [address]    Special Requests   Final    BOTTLES DRAWN AEROBIC AND ANAEROBIC Blood Culture adequate volume Performed at [facility], [address], [place], [place] [address]    Culture   Final  NO GROWTH 2 DAYS Performed at [facility], [address], [place], [place] [address]    Report Status PENDING  Incomplete     Labs: Basic Metabolic Panel: Recent Labs  Lab 08/16/23 1355 08/17/23 0640 08/18/23 0333  NA 136 137 141  K 3.9 3.8 3.6  CL 101 102 108  CO2 25 25 23   GLUCOSE 145* 134* 146*  BUN 9 9 <5*  CREATININE 0.60 0.43* 0.36*  CALCIUM 9.1 9.0 8.5*  MG  --  2.1 1.8  PHOS  --  2.4* 2.3*   Liver Function Tests: Recent Labs  Lab 08/16/23 1355 08/18/23 0333  AST 31 19  ALT 30 24  ALKPHOS 93 72  BILITOT 0.8 0.7  PROT 8.1 6.6  ALBUMIN 3.8 3.2*   CBC: Recent Labs  Lab 08/16/23 1355 08/17/23 0640 08/18/23 0333  WBC 11.6* 9.4 9.5  NEUTROABS 7.9*  --   --   HGB 11.9* 10.9* 10.2*  HCT  38.3 35.4* 32.6*  MCV 85.5 86.3 85.6  PLT 217 206 197   CBG: No results for input(s): "GLUCAP" in the last 168 hours. Hgb A1c No results for input(s): "HGBA1C" in the last 72 hours. Lipid Profile No results for input(s): "CHOL", "HDL", "LDLCALC", "TRIG", "CHOLHDL", "LDLDIRECT" in the last 72 hours. Thyroid function studies No results for input(s): "TSH", "T4TOTAL", "T3FREE", "THYROIDAB" in the last 72 hours.  Invalid input(s): "FREET3" Urinalysis    Component Value Date/Time   COLORURINE YELLOW 08/16/2023 1439   APPEARANCEUR CLEAR 08/16/2023 1439   LABSPEC 1.011 08/16/2023 1439   PHURINE 7.0 08/16/2023 1439   GLUCOSEU NEGATIVE 08/16/2023 1439   GLUCOSEU NEGATIVE 04/13/2022 1047   HGBUR SMALL (A) 08/16/2023 1439   BILIRUBINUR NEGATIVE 08/16/2023 1439   KETONESUR 20 (A) 08/16/2023 1439   PROTEINUR 30 (A) 08/16/2023 1439   UROBILINOGEN 1.0 04/13/2022 1047   NITRITE NEGATIVE 08/16/2023 1439   LEUKOCYTESUR NEGATIVE 08/16/2023 1439    FURTHER DISCHARGE INSTRUCTIONS:   Get Medicines reviewed and adjusted: Please take all your medications with you for your next visit with your Primary MD   Laboratory/radiological data: Please request your Primary MD to go over all hospital tests and procedure/radiological results at the follow up, please ask your Primary MD to get all Hospital records sent to his/her office.   In some cases, they will be blood work, cultures and biopsy results pending at the time of your discharge. Please request that your primary care M.D. goes through all the records of your hospital data and follows up on these results.   Also Note the following: If you experience worsening of your admission symptoms, develop shortness of breath, life threatening emergency, suicidal or homicidal thoughts you must seek medical attention immediately by calling 911 or calling your MD immediately  if symptoms less severe.   You must read complete instructions/literature along with  all the possible adverse reactions/side effects for all the Medicines you take and that have been prescribed to you. Take any new Medicines after you have completely understood and accpet all the possible adverse reactions/side effects.    Do not drive when taking Pain medications or sleeping medications (Benzodaizepines)   Do not take more than prescribed Pain, Sleep and Anxiety Medications. It is not advisable to combine anxiety,sleep and pain medications without talking with your primary care practitioner   Special Instructions: If you have smoked or chewed Tobacco  in the last 2 yrs please stop smoking, stop any regular Alcohol  and or  any Recreational drug use.   Wear Seat belts while driving.   Please note: You were cared for by a hospitalist during your hospital stay. Once you are discharged, your primary care physician will handle any further medical issues. Please note that NO REFILLS for any discharge medications will be authorized once you are discharged, as it is imperative that you return to your primary care physician (or establish a relationship with a primary care physician if you do not have one) for your post hospital discharge needs so that they can reassess your need for medications and monitor your lab values.  Time coordinating discharge: 35 minutes  SIGNED:  Pamella Pert, MD, PhD 08/18/2023, 2:37 PM

## 2023-08-18 NOTE — Anesthesia Preprocedure Evaluation (Signed)
Anesthesia Evaluation  Patient identified by MRN, date of birth, ID band Patient awake    Reviewed: Allergy & Precautions, H&P , NPO status , Patient's Chart, lab work & pertinent test results  Airway Mallampati: II  TM Distance: >3 FB Neck ROM: Full    Dental no notable dental hx.    Pulmonary asthma , Current Smoker and Patient abstained from smoking.   Pulmonary exam normal breath sounds clear to auscultation       Cardiovascular hypertension, negative cardio ROS Normal cardiovascular exam Rhythm:Regular Rate:Normal     Neuro/Psych  PSYCHIATRIC DISORDERS Anxiety Depression    negative neurological ROS     GI/Hepatic Neg liver ROS,,,colonic obstruction, abdominal pain   Endo/Other  negative endocrine ROS    Renal/GU negative Renal ROS  negative genitourinary   Musculoskeletal negative musculoskeletal ROS (+)    Abdominal   Peds negative pediatric ROS (+)  Hematology  (+) Blood dyscrasia, anemia   Anesthesia Other Findings   Reproductive/Obstetrics negative OB ROS                              Anesthesia Physical Anesthesia Plan  ASA: 3  Anesthesia Plan: MAC   Post-op Pain Management:    Induction: Intravenous  PONV Risk Score and Plan: Propofol infusion and Treatment may vary due to age or medical condition  Airway Management Planned: Natural Airway  Additional Equipment:   Intra-op Plan:   Post-operative Plan:   Informed Consent: I have reviewed the patients History and Physical, chart, labs and discussed the procedure including the risks, benefits and alternatives for the proposed anesthesia with the patient or authorized representative who has indicated his/her understanding and acceptance.     Dental advisory given  Plan Discussed with: CRNA  Anesthesia Plan Comments:          Anesthesia Quick Evaluation

## 2023-08-18 NOTE — Plan of Care (Signed)
Problem: Education: Goal: Knowledge of General Education information will improve Description Including pain rating scale, medication(s)/side effects and non-pharmacologic comfort measures Outcome: Progressing

## 2023-08-18 NOTE — Progress Notes (Addendum)
Patient was able to tolerate all of colon prep. Up and down to the bathroom. Patient stool is still brown with some stool. Will continue to monitor.

## 2023-08-18 NOTE — TOC Initial Note (Signed)
Transition of Care [facility]) - Initial/Assessment Note    Patient Details  Name: Michelle Solomon MRN: [id number] Date of Birth: 08/17/1960  Transition of Care [facility]) CM/SW Contact:    Howell Rucks, RN Phone Number: 08/18/2023, 10:29 AM  Clinical Narrative:  Met with pt at bedside to introduce role of TOC/NCM and review for dc planning. Pt reports she has an established PCP and pharmacy, no current home care services or home DME, reports she feels safe returning home with support from family, confirmed transportation available at discharge. TOC will continue to follow.                 Expected Discharge Plan: Home/Self Care Barriers to Discharge: Continued Medical Work up   Patient Goals and CMS Choice Patient states their goals for this hospitalization and ongoing recovery are:: return home          Expected Discharge Plan and Services                                              Prior Living Arrangements/Services   Lives with:: Self Patient language and need for interpreter reviewed:: Yes Do you feel safe going back to the place where you live?: Yes      Need for Family Participation in Patient Care: Yes (Comment) Care giver support system in place?: Yes (comment)   Criminal Activity/Legal Involvement Pertinent to Current Situation/Hospitalization: No - Comment as needed  Activities of Daily Living   ADL Screening (condition at time of admission) Independently performs ADLs?: Yes (appropriate for developmental age) Is the patient deaf or have difficulty hearing?: No Does the patient have difficulty seeing, even when wearing glasses/contacts?: No Does the patient have difficulty concentrating, remembering, or making decisions?: No  Permission Sought/Granted                  Emotional Assessment Appearance:: Appears stated age Attitude/Demeanor/Rapport: Gracious Affect (typically observed): Accepting Orientation: : Oriented to Self, Oriented to  Place, Oriented to  Time, Oriented to Situation Alcohol / Substance Use: Not Applicable Psych Involvement: No (comment)  Admission diagnosis:  Colonic obstruction (HCC) [K56.609] Colonic stricture (HCC) [K56.699] Patient Active Problem List   Diagnosis Date Noted   Neuroendocrine carcinoma metastatic to multiple sites (HCC) 08/17/2023   Encounter for smoking cessation counseling 08/17/2023   Obstipation 08/17/2023   Abnormal CT scan, gastrointestinal tract 08/17/2023   Colonic stricture (HCC) 08/16/2023   Microhematuria 04/13/2022   HTN (hypertension) 04/13/2022   Aortic atherosclerosis (HCC) 01/14/2021   Chest pain 01/14/2021   Metastatic malignant neuroendocrine tumor to lymph node (HCC) 12/20/2019   Pulmonary mass 08/30/2019   Vitamin D deficiency 08/30/2019   Insomnia 04/18/2018   Hot flashes 12/28/2017   Hyperglycemia 11/09/2017   Grief reaction 09/20/2017   Left sided sciatica 03/14/2017   Acne 06/10/2016   Frozen shoulder syndrome 05/12/2015   Right shoulder pain 04/30/2015   Breast pain, left 12/16/2014   Former smoker 12/16/2014   Anxiety and depression 12/16/2014   Hearing loss 04/05/2012   Encounter for well adult exam with abnormal findings 12/24/2010   Back pain 12/24/2010   CONJUNCTIVITIS, ALLERGIC 11/27/2008   Anal or rectal pain 06/23/2008   Hemorrhoids 06/16/2008   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   ANEMIA-IRON DEFICIENCY 05/29/2007   Allergic rhinitis 05/29/2007   Asthma 05/29/2007   PCP:  Corwin Levins, MD Pharmacy:   [facility] [address] [place] - [address] AT [facility] OF [facility] & [facility] [address] [place] [address] Phone: [phone] Fax: [phone]  [id number] - [place], [place] - [address] AT CORNER OF [facility] [address] [place] [address] Phone: [phone] Fax: [phone]  [facility] [place], [place] - [address] [facility] [address] [place] [address] Phone: [phone] Fax: [phone]     Social Drivers of Health (SDOH) Social History: SDOH Screenings   Food Insecurity: No Food Insecurity (08/17/2023)  Housing: Unknown (08/17/2023)  Transportation Needs: No Transportation Needs (08/17/2023)  Utilities: Not At Risk (08/17/2023)  Depression (PHQ2-9): Low Risk  (04/13/2022)  Tobacco Use: High Risk (08/17/2023)   SDOH Interventions:     Readmission Risk Interventions    08/18/2023   10:27 AM  Readmission Risk Prevention Plan  Post Dischage Appt Complete  Medication Screening Complete  Transportation Screening Complete

## 2023-08-18 NOTE — Progress Notes (Addendum)
   Patient Name: Michelle Solomon Date of Encounter: 08/18/2023, 8:10 AM    Subjective  Patient doing well. Had bowel prep over night with brown liquid stool with mucus, last BM was about 740AM. States AB pain has improved, continues with back pain.  No nausea, vomiting, fever, chills.    Objective  BP (!) 147/104   Pulse 92   Temp 97.6 F (36.4 C) (Oral)   Resp 15   Ht 5\' 3"  (1.6 m)   Wt 79.4 kg   LMP 10/26/2017   SpO2 100%   BMI 31.00 kg/m   General:   Pleasant, well developed female in no acute distress Heart:  regular rate and rhythm, no murmurs or gallops Pulm: Clear anteriorly; no wheezing Abdomen:  Soft, Obese AB, nondistended, Active bowel sounds. Non tender Extremities:  Without edema. Neurologic:  Alert and  oriented x4;  No focal deficits.  Psychiatric:  Cooperative. Normal mood and affect.    Assessment and Plan  Obstipation and abdominal pain with CT showing focal narrowing proximal descending colon with colonic obstruction, rule out stricture versus mass  Last colonoscopy 2021 with 5 diminutive polyps recall 11/20/2024 No nausea and vomiting at this time, AB pain improved Given 1 dose MiraLAX 1 dose Senokot 2/12, 1 enema KUB 2/13 shows normal bowel gas pattern no free air and unremarkable  - patient had golytely yesterday with moviprep last night - continues with brown liquids stool, plan for colonoscopy today at 1 pm - will give 1/2 dose of moviprep now, fleet enema x 2 before colonoscopy and dulcolax this AM - if there is still too much stool in the colon, can proceed with flex sig only with plan for complete colonoscopy outpatient.  - patient agrees with this plan - NPO at 11 AM -We have discussed the risks of bleeding, infection, perforation, medication reactions, and remote risk of death associated with colonoscopy. All questions were answered and the patient acknowledges these risk and wishes to proceed.  Metastatic neuroendocrine tumor with mets to  bone, lymph nodes, liver Following with Dr. Leonides Schanz recent PET scan 1/27 showed progression of hepatic lobe and skeletal metastasis Continue follow up with oncology   Leukocytosis Afebrile likely reactive, improved to 9.5  Normocytic anemia HGB 10.2 No evidence of GI bleeding No IDA, normal iron and ferritin 08/01/2023 Likely anemia of chronic disease

## 2023-08-21 ENCOUNTER — Encounter (HOSPITAL_COMMUNITY): Payer: Self-pay | Admitting: Gastroenterology

## 2023-08-21 LAB — CULTURE, BLOOD (ROUTINE X 2)
Culture: NO GROWTH
Special Requests: ADEQUATE

## 2023-08-22 ENCOUNTER — Telehealth: Payer: Self-pay | Admitting: Hematology and Oncology

## 2023-08-23 ENCOUNTER — Telehealth: Payer: Self-pay

## 2023-08-23 ENCOUNTER — Other Ambulatory Visit: Payer: 59

## 2023-08-23 ENCOUNTER — Inpatient Hospital Stay: Payer: 59

## 2023-08-23 ENCOUNTER — Inpatient Hospital Stay (HOSPITAL_BASED_OUTPATIENT_CLINIC_OR_DEPARTMENT_OTHER): Payer: 59 | Admitting: Hematology and Oncology

## 2023-08-23 ENCOUNTER — Encounter: Payer: Self-pay | Admitting: Gastroenterology

## 2023-08-23 ENCOUNTER — Encounter (HOSPITAL_COMMUNITY): Payer: Self-pay | Admitting: Hematology and Oncology

## 2023-08-23 ENCOUNTER — Inpatient Hospital Stay: Payer: 59 | Admitting: Hematology and Oncology

## 2023-08-23 VITALS — BP 127/95 | HR 99 | Temp 98.0°F | Resp 17 | Wt 167.1 lb

## 2023-08-23 DIAGNOSIS — R0781 Pleurodynia: Secondary | ICD-10-CM | POA: Diagnosis not present

## 2023-08-23 DIAGNOSIS — Z79899 Other long term (current) drug therapy: Secondary | ICD-10-CM | POA: Diagnosis not present

## 2023-08-23 DIAGNOSIS — K5900 Constipation, unspecified: Secondary | ICD-10-CM | POA: Diagnosis not present

## 2023-08-23 DIAGNOSIS — C7A8 Other malignant neuroendocrine tumors: Secondary | ICD-10-CM

## 2023-08-23 DIAGNOSIS — R232 Flushing: Secondary | ICD-10-CM | POA: Diagnosis not present

## 2023-08-23 DIAGNOSIS — F17210 Nicotine dependence, cigarettes, uncomplicated: Secondary | ICD-10-CM | POA: Diagnosis not present

## 2023-08-23 DIAGNOSIS — C7B8 Other secondary neuroendocrine tumors: Secondary | ICD-10-CM

## 2023-08-23 DIAGNOSIS — C7B03 Secondary carcinoid tumors of bone: Secondary | ICD-10-CM | POA: Diagnosis not present

## 2023-08-23 DIAGNOSIS — M51379 Other intervertebral disc degeneration, lumbosacral region without mention of lumbar back pain or lower extremity pain: Secondary | ICD-10-CM | POA: Diagnosis not present

## 2023-08-23 DIAGNOSIS — J45909 Unspecified asthma, uncomplicated: Secondary | ICD-10-CM | POA: Diagnosis not present

## 2023-08-23 DIAGNOSIS — C7A025 Malignant carcinoid tumor of the sigmoid colon: Secondary | ICD-10-CM | POA: Diagnosis present

## 2023-08-23 DIAGNOSIS — Z860101 Personal history of adenomatous and serrated colon polyps: Secondary | ICD-10-CM | POA: Diagnosis not present

## 2023-08-23 DIAGNOSIS — M5136 Other intervertebral disc degeneration, lumbar region with discogenic back pain only: Secondary | ICD-10-CM | POA: Diagnosis not present

## 2023-08-23 DIAGNOSIS — I10 Essential (primary) hypertension: Secondary | ICD-10-CM | POA: Diagnosis not present

## 2023-08-23 LAB — CMP (CANCER CENTER ONLY)
ALT: 19 U/L (ref 0–44)
AST: 16 U/L (ref 15–41)
Albumin: 4.1 g/dL (ref 3.5–5.0)
Alkaline Phosphatase: 89 U/L (ref 38–126)
Anion gap: 8 (ref 5–15)
BUN: 6 mg/dL — ABNORMAL LOW (ref 8–23)
CO2: 28 mmol/L (ref 22–32)
Calcium: 9.6 mg/dL (ref 8.9–10.3)
Chloride: 102 mmol/L (ref 98–111)
Creatinine: 0.76 mg/dL (ref 0.44–1.00)
GFR, Estimated: >60 mL/min (ref >60)
Glucose, Bld: 148 mg/dL — ABNORMAL HIGH (ref 70–99)
Potassium: 4 mmol/L (ref 3.5–5.1)
Sodium: 138 mmol/L (ref 135–145)
Total Bilirubin: 0.4 mg/dL (ref 0.0–1.2)
Total Protein: 7.4 g/dL (ref 6.5–8.1)

## 2023-08-23 LAB — CBC WITH DIFFERENTIAL (CANCER CENTER ONLY)
Abs Immature Granulocytes: 0.02 10*3/uL (ref 0.00–0.07)
Basophils Absolute: 0 10*3/uL (ref 0.0–0.1)
Basophils Relative: 0 %
Eosinophils Absolute: 0.2 10*3/uL (ref 0.0–0.5)
Eosinophils Relative: 3 %
HCT: 35.5 % — ABNORMAL LOW (ref 36.0–46.0)
Hemoglobin: 11.4 g/dL — ABNORMAL LOW (ref 12.0–15.0)
Immature Granulocytes: 0 %
Lymphocytes Relative: 49 %
Lymphs Abs: 3.3 10*3/uL (ref 0.7–4.0)
MCH: 26.5 pg (ref 26.0–34.0)
MCHC: 32.1 g/dL (ref 30.0–36.0)
MCV: 82.6 fL (ref 80.0–100.0)
Monocytes Absolute: 0.7 10*3/uL (ref 0.1–1.0)
Monocytes Relative: 10 %
Neutro Abs: 2.6 10*3/uL (ref 1.7–7.7)
Neutrophils Relative %: 38 %
Platelet Count: 340 10*3/uL (ref 150–400)
RBC: 4.3 MIL/uL (ref 3.87–5.11)
RDW: 14.2 % (ref 11.5–15.5)
WBC Count: 6.9 10*3/uL (ref 4.0–10.5)
nRBC: 0 % (ref 0.0–0.2)

## 2023-08-23 LAB — SURGICAL PATHOLOGY

## 2023-08-23 NOTE — Transitions of Care (Post Inpatient/ED Visit) (Signed)
   08/23/2023  Name: Kyleena Scheirer MRN: [id number] DOB: 1960-09-18  Today's TOC FU Call Status: Today's TOC FU Call Status:: Unsuccessful Call (1st Attempt) Unsuccessful Call (1st Attempt) Date: 08/23/23  Attempted to reach the patient regarding the most recent Inpatient/ED visit.  Left HIPAA compliant VM  Follow Up Plan: Additional outreach attempts will be made to reach the patient to complete the Transitions of Care (Post Inpatient/ED visit) call.   Signature Elyse Jarvis, [place]

## 2023-08-23 NOTE — Progress Notes (Signed)
 [facility] [phone]   [phone]  PROGRESS NOTE  Patient Care Team: Corwin Levins, MD as PCP - General  Hematological/Oncological History # Metastatic Neuroendocrine Tumor, Metastasis to the Spine 1) 08/26/2019: CT Abdomen Pelvis performed due to RUQ abdominal pain. Imaging showed a soft tissue mass abutting the right posterior eighth rib 2) 09/06/2019: CT chest performed which showed Ill-defined soft tissue nodule along the medial right posterior thoracic wall at the 8th intercostal space to the right of the spine. MRI was recommended. 3) 10/19/2019: MRI Thoracic spine showed enhancing lesions throughout the visualized spine consistent with metastatic disease. Additionally there was a 1.5 cm soft tissue nodule in the posteromedial right eighth intercostal space, more concerning for a metastasis 4) 10/25/2019: establish care with Dr. Leonides Schanz   5) 11/05/2019: attempted biopsy of soft tissue mass at right posterior 8th rib, but lesion had dissipated at time of biopsy attempt 6) 11/27/2019: PET CT scan performed showing right inguinal lymph nodes and osseous lesions, indicative of metastatic disease of unknown primary. 7) 12/11/2019: CT bone marrow biopsy showed metastatic neuroendocrine neoplasm 8) 12/27/2019: start of lanreotide 120mg  sub q28 days  9) 10/15/2020: NM PET (CU-64) scan showed widespread osseous and lymphatic uptake consistent with metastatic spread of neuroendocrine tumor.  This is more avid than prior. 01/20/2021: clinic visit with Dr. Nolen Mu due to concern for possible mantle cell lymphoma seen on pathology review of previous biopsy 04/15/2021: Dose 1 (of 4) of lutathera treatment with nuclear medicine.  06/10/2021: Dose 2 (of 4) of lutathera treatment with nuclear medicine.  08/05/2021: Dose 3 (of 4) of lutathera treatment with nuclear medicine.  09/03/2021:Dose 4 (of 4) of lutathera treatment with nuclear medicine.  11/26/2021: NM Copper Dotatate Scan  shows dramatic reduction in number and activity of skeletal metastasis.   #Low Grade Neuroendocrine Tumor 1) 2006: reportedly had rectal carcinoid tumor removed 2) 08/14/2008: patient had a flexibile sigmoidoscopy with endoscopic ultrasound which resected an 8mm, subepithelial lesion in rectum. Findings consistent with low grade neuroendocrine tumor. 3) 04/08/2010: repeat colonoscopy, no residual tumor. Repeat recommended in 2016.  4) 12/11/2019: metastatic recurrence found on biopsy, noted above. 5) 11/26/2021:  NM PET CU-64 showed a dramatic reduction in the number and activity as skeletal metastases.  #Mantle Cell Lymphoma 11/24/2020: incidental findings of low level mantle cell lymphoma on bone marrow biopsy. Noted on outside review of pathology at [facility].  01/20/2021: clinic visit with Dr. Nolen Mu due to concern for possible mantle cell lymphoma seen on pathology review of previous biopsy  # Meningioma  08/09/2022: MRI Brain ordered due to PET scan showing concern for meningioma. Scan showed dural-based mass at the right posterior frontal vertex, performed at [facility]  10/24/2022: 2nd opinion at [facility] 11/04/2022: MRI Brain showed meningioma measuring 3.5 x 3.5 x 2.1 cm.   Interval History:  Michelle Solomon 63 y.o. female with medical history significant for metastatic neuroendocrine tumor of the colon who presents for a follow up visit. The patient's last visit was on 08/15/2023. In the interim since the last visit she was hospitalized with concern for obstruction, found to have constipation.   On exam today Ms. Behl reports her bowels are now moving normally after her hospitalization.  She reports she had no bowel movements for 5 days and subsequently has made changes to her diet trying to incorporate more veggies, fruit, and water.  She reports that she is not currently having any pain and has not passed any blood in the  urine or stool.  Overall she feels well..  She denies any fevers,  chills, shortness of breath, chest pain, palpitations or cough. She has no other complaints.   A full 10 point ROS is listed below.  Today we discussed extensively treatment options moving forward.  We discussed everolimus versus repeat Lutathera.  We discussed all the risk and benefits of both treatments and after our discussion the patient decided she would opted to proceed with Lutathera.  We will make arrangements to make this occur.  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   Cancer (HCC)    Neuro endocrine tumor   CONJUNCTIVITIS, ALLERGIC 11/27/2008   HEMORRHOIDS 06/16/2008   HTN (hypertension)    Hx of adenomatous colonic polyps 11/2019   NIPPLE DISCHARGE 01/21/2010   Other specified forms of hearing loss 10/08/2009   Overweight(278.02) 05/29/2007   RASH-NONVESICULAR 05/29/2007   Wheezing 06/22/2010    SURGICAL HISTORY: Past Surgical History:  Procedure Laterality Date   COLONOSCOPY     COLONOSCOPY WITH PROPOFOL N/A 08/18/2023   Procedure: COLONOSCOPY WITH PROPOFOL;  Surgeon: Benancio Deeds, MD;  Location: [facility];  Service: Gastroenterology;  Laterality: N/A;   POLYPECTOMY  08/18/2023   Procedure: POLYPECTOMY;  Surgeon: Benancio Deeds, MD;  Location: [facility];  Service: Gastroenterology;;    SOCIAL HISTORY: Social History   Socioeconomic History   Marital status: Widowed    Spouse name: Not on file   Number of children: 3   Years of education: Not on file   Highest education level: Not on file  Occupational History   Occupation: CARD REPLACEMEN    Employer: [employer]   Occupation: customer service  Tobacco Use   Smoking status: Some Days    Current packs/day: 0.00    Average  packs/day: 0.5 packs/day for 40.0 years (20.0 ttl pk-yrs)    Types: Cigarettes    Start date: 10/22/1979    Last attempt to quit: 10/22/2019    Years since quitting: 3.8   Smokeless tobacco: Never   Tobacco comments:    1 pack weekly.  started smoking at 63 years old  Vaping Use   Vaping status: Never Used  Substance and Sexual Activity   Alcohol use: Yes    Comment: ocassional   Drug use: No   Sexual activity: Not Currently  Other Topics Concern   Not on file  Social History Narrative   Daily caffeine use 3   Patient does not get regular exercise   Social Drivers of Health   Financial Resource Strain: Not on file  Food Insecurity: No Food Insecurity (08/17/2023)   Hunger Vital Sign    Worried About Running Out of Food in the Last Year: Never true    Ran Out of Food in the Last Year: Never true  Transportation Needs: No Transportation Needs (08/17/2023)   PRAPARE - Administrator, Civil Service (Medical): No    Lack of Transportation (Non-Medical): No  Physical Activity: Not on file  Stress: Not on file  Social Connections: Not on file  Intimate Partner Violence: Not At Risk (08/17/2023)   Humiliation, Afraid, Rape, and Kick questionnaire    Fear of Current or  Ex-Partner: No    Emotionally Abused: No    Physically Abused: No    Sexually Abused: No    FAMILY HISTORY: Family History  Problem Relation Age of Onset   Allergies Sister    Diabetes Sister    Hypertension Sister    Hypertension Mother    Prostate cancer Father    Hypertension Brother    Hypertension Sister    Diabetes Paternal Aunt    Cancer Paternal Aunt        type unknown   Prostate cancer Brother    Stroke Maternal Aunt    Hypertension Maternal Aunt    Colon cancer Neg Hx    Esophageal cancer Neg Hx    Rectal cancer Neg Hx    Stomach cancer Neg Hx     ALLERGIES:  is allergic to shellfish allergy.  MEDICATIONS:  Current Outpatient Medications  Medication Sig Dispense Refill    buPROPion (WELLBUTRIN XL) 300 MG 24 hr tablet TAKE 1 TABLET(300 MG) BY MOUTH DAILY 90 tablet 3   cholecalciferol (VITAMIN D3) 25 MCG (1000 UNIT) tablet Take 1,000 Units by mouth daily.     ferrous sulfate 325 (65 FE) MG tablet Take 1 tablet (325 mg total) by mouth daily with breakfast. Please take with a source of Vitamin C 90 tablet 3   GARLIC PO Take 1 tablet by mouth daily.     GINKGO BILOBA EXTRACT PO Take 1 tablet by mouth daily.     hydrochlorothiazide (HYDRODIURIL) 12.5 MG tablet TAKE 1 TABLET(12.5 MG) BY MOUTH DAILY 30 tablet 0   lanreotide acetate (SOMATULINE DEPOT) 120 MG/0.5ML injection Inject 120 mg into the skin every 28 (twenty-eight) days. 0.5 mL 12   loratadine (CLARITIN) 10 MG tablet Take 10 mg by mouth daily as needed for allergies.     losartan (COZAAR) 50 MG tablet TAKE 1 TABLET(50 MG) BY MOUTH DAILY 90 tablet 3   ondansetron (ZOFRAN) 8 MG tablet Take 1 tablet (8 mg total) by mouth 2 (two) times daily as needed for nausea or vomiting. 20 tablet 0   oxyCODONE-acetaminophen (PERCOCET/ROXICET) 5-325 MG tablet Take 1 tablet by mouth every 6 (six) hours as needed for severe pain. (Patient not taking: Reported on 08/17/2023) 30 tablet 0   polyethylene glycol (MIRALAX) 17 g packet Take 17 g by mouth 2 (two) times daily. 60 packet 0   Turmeric 500 MG CAPS Take 500 mg by mouth daily.     VITAMIN D-VITAMIN K PO Take 1 tablet by mouth daily.     zolpidem (AMBIEN) 10 MG tablet Take 1 tablet (10 mg total) by mouth at bedtime as needed for sleep. 90 tablet 1   No current facility-administered medications for this visit.   Facility-Administered Medications Ordered in Other Visits  Medication Dose Route Frequency Provider Last Rate Last Admin   octreotide (SANDOSTATIN LAR) 30 MG IM injection             REVIEW OF SYSTEMS:   Constitutional: ( - ) fevers, ( - )  chills , ( - ) night sweats Eyes: ( - ) blurriness of vision, ( - ) double vision, ( - ) watery eyes Ears, nose, mouth, throat,  and face: ( - ) mucositis, ( - ) sore throat Respiratory: ( - ) cough, ( - ) dyspnea, ( - ) wheezes Cardiovascular: ( - ) palpitation, ( - ) chest discomfort, ( - ) lower extremity swelling Gastrointestinal:  ( - ) nausea, ( - ) heartburn, ( - )  change in bowel habits Skin: ( - ) abnormal skin rashes Lymphatics: ( - ) new lymphadenopathy, ( - ) easy bruising Neurological: ( - ) numbness, ( - ) tingling, ( - ) new weaknesses Behavioral/Psych: ( - ) mood change, ( - ) new changes  All other systems were reviewed with the patient and are negative.  PHYSICAL EXAMINATION: ECOG PERFORMANCE STATUS: 0 - Asymptomatic  Vitals:   08/23/23 0844  BP: (!) 127/95  Pulse: 99  Resp: 17  Temp: 98 F (36.7 C)  SpO2: 100%        Filed Weights   08/23/23 0844  Weight: 167 lb 1.6 oz (75.8 kg)      GENERAL: well appearing middle aged [place] American female in NAD  SKIN: skin color, texture, turgor are normal, no rashes or significant lesions EYES: conjunctiva are pink and non-injected, sclera clear LUNGS: clear to auscultation and percussion with normal breathing effort HEART: regular rate & rhythm and no murmurs and no lower extremity edema Musculoskeletal: no cyanosis of digits and no clubbing  PSYCH: alert & oriented x 3, fluent speech NEURO: no focal motor/sensory deficits  LABORATORY DATA:  I have reviewed the data as listed    Latest Ref Rng & Units 08/23/2023    8:15 AM 08/18/2023    3:33 AM 08/17/2023    6:40 AM  CBC  WBC 4.0 - 10.5 K/uL 6.9  9.5  9.4   Hemoglobin 12.0 - 15.0 g/dL 82.9  56.2  13.0   Hematocrit 36.0 - 46.0 % 35.5  32.6  35.4   Platelets 150 - 400 K/uL 340  197  206        Latest Ref Rng & Units 08/23/2023    8:15 AM 08/18/2023    3:33 AM 08/17/2023    6:40 AM  CMP  Glucose 70 - 99 mg/dL 865  784  696   BUN 8 - 23 mg/dL 6  <5  9   Creatinine 2.95 - 1.00 mg/dL 2.84  1.32  4.40   Sodium 135 - 145 mmol/L 138  141  137   Potassium 3.5 - 5.1 mmol/L 4.0  3.6   3.8   Chloride 98 - 111 mmol/L 102  108  102   CO2 22 - 32 mmol/L 28  23  25    Calcium 8.9 - 10.3 mg/dL 9.6  8.5  9.0   Total Protein 6.5 - 8.1 g/dL 7.4  6.6    Total Bilirubin 0.0 - 1.2 mg/dL 0.4  0.7    Alkaline Phos 38 - 126 U/L 89  72    AST 15 - 41 U/L 16  19    ALT 0 - 44 U/L 19  24      RADIOGRAPHIC STUDIES: I have personally reviewed the radiological images as listed and agreed with the findings in the report.  DG Abd 2 Views Result Date: 08/17/2023 CLINICAL DATA:  63 year old female with history of colonic stricture. EXAM: ABDOMEN - 2 VIEW COMPARISON:  No priors. FINDINGS: The bowel gas pattern is normal. There is no evidence of free air. No radio-opaque calculi or other significant radiographic abnormality is seen. IMPRESSION: Negative. Electronically Signed   By: Trudie Reed M.D.   On: 08/17/2023 06:20   MR THORACIC SPINE W WO CONTRAST Result Date: 08/16/2023 CLINICAL DATA:  Metastatic neuroendocrine tumor EXAM: MRI THORACIC WITHOUT AND WITH CONTRAST TECHNIQUE: Multiplanar and multiecho pulse sequences of the thoracic spine were obtained without and with intravenous contrast. CONTRAST:  8mL GADAVIST GADOBUTROL 1 MMOL/ML IV SOLN COMPARISON:  MRI 10/18/2019 FINDINGS: Alignment:  No malalignment. Vertebrae: No regional fracture. Marked worsening of widespread metastatic disease throughout the bones of the region including the spine and ribs. No pathologic fracture is seen. No visible extraosseous extension of tumor. Cord:  No primary cord lesion. Paraspinal and other soft tissues: Multiple T2 bright lesions seen throughout the liver. Disc levels: No disc herniation. There is marked facet and ligamentous hypertrophy at the T9-10 level, right more than left, with encroachment upon the spinal canal but no actual compression of the cord. IMPRESSION: 1. Marked worsening of widespread metastatic disease throughout the bones of the region including the spine and ribs. No pathologic fracture  is seen. No visible extraosseous extension of tumor. 2. Multiple T2 bright lesions throughout the liver, consistent with metastatic disease. 3. Marked facet and ligamentous hypertrophy at the T9-10 level, right more than left, with encroachment upon the spinal canal but no actual compression of the cord. Electronically Signed   By: Paulina Fusi M.D.   On: 08/16/2023 20:28   MR Lumbar Spine W Wo Contrast Result Date: 08/16/2023 CLINICAL DATA:  Low back pain. Cancer suspected. History of neuroendocrine tumor. EXAM: MRI LUMBAR SPINE WITHOUT AND WITH CONTRAST TECHNIQUE: Multiplanar and multiecho pulse sequences of the lumbar spine were obtained without and with intravenous contrast. CONTRAST:  8mL GADAVIST GADOBUTROL 1 MMOL/ML IV SOLN COMPARISON:  CT abdomen same day.  PET scan 07/31/2023. FINDINGS: Segmentation:  5 lumbar type vertebral bodies. Alignment:  Normal Vertebrae: No evidence regional fracture. The study shows complete marrow replacement by tumor throughout the skeletal system. There is no evidence of acute pathologic fracture. No extraosseous tumor extension is identified. Conus medullaris and cauda equina: Conus extends to the L1 level. Conus and cauda equina appear normal. Paraspinal and other soft tissues: Negative Disc levels: No significant disc level finding at L3-4 or above. L4-5: Mild bulging of the disc. Mild facet and ligamentous hypertrophy. No compressive stenosis. L5-S1: Moderate bulging of the disc. Mild facet and ligamentous hypertrophy. No compressive stenosis. IMPRESSION: 1. Widespread osseous metastatic disease throughout the regional osseous structures. No evidence of acute pathologic fracture. No extraosseous tumor extension. 2. L4-5: Mild bulging of the disc. Mild facet and ligamentous hypertrophy. No compressive stenosis. 3. L5-S1: Moderate bulging of the disc. Mild facet and ligamentous hypertrophy. No compressive stenosis. Electronically Signed   By: Paulina Fusi M.D.   On:  08/16/2023 20:21   CT ABDOMEN PELVIS W CONTRAST Result Date: 08/16/2023 CLINICAL DATA:  No bowel movements EXAM: CT ABDOMEN AND PELVIS WITH CONTRAST TECHNIQUE: Multidetector CT imaging of the abdomen and pelvis was performed using the standard protocol following bolus administration of intravenous contrast. RADIATION DOSE REDUCTION: This exam was performed according to the departmental dose-optimization program which includes automated exposure control, adjustment of the mA and/or kV according to patient size and/or use of iterative reconstruction technique. CONTRAST:  OMNIPAQUE IOHEXOL 300 MG/ML  SOLN COMPARISON:  CT 08/26/2019, PET CT 07/31/2023, 06/07/2022 FINDINGS: Lower chest: Lung bases demonstrate no acute airspace disease. Than like areas of atelectasis at the right middle lobe and bilateral bases. Hepatobiliary: Numerous hypodense liver lesions, mostly subcentimeter, not clearly seen on more remote prior exams. Right hepatic lobe lesion on series 2, image 15 measures 15 mm, previously 11 mm on PET CT from January. No calcified gallstone or biliary dilatation Pancreas: Unremarkable. No pancreatic ductal dilatation or surrounding inflammatory changes. Spleen: Normal in size without focal abnormality. Adrenals/Urinary Tract: Adrenal  glands are unremarkable. Kidneys are normal, without renal calculi, focal lesion, or hydronephrosis. Bladder is unremarkable. Stomach/Bowel: Stomach is nonenlarged. There is no dilated small bowel. Air and fecal distension of the colon measuring up to 7.3 cm, with relative caliber change at the descending colon. Focal narrowed area at the proximal descending colon on coronal series 8, image 85 through 90. Vascular/Lymphatic: Nonaneurysmal aorta.  No suspicious lymph nodes. Reproductive: Lobulated soft tissue masses, some with calcification measuring up to 5.2 cm, presumably representing uterine fibroids. Other: Negative for pelvic effusion or free air. Numerous small  subcutaneous nodules within the posterior flank and gluteal regions. These were not hypermetabolic on recent PET CT and are unchanged. Musculoskeletal: Widespread skeletal metastatic disease. No acute osseous abnormality IMPRESSION: 1. Air and fecal distension of the colon measuring up to 7.3 cm, with relative caliber change at the descending colon, suggesting a degree of colonic obstruction. Focal narrowed area at the proximal descending colon, possible stricture or mass, but some air and stool seen distal to this. Correlation with colonoscopy is suggested. 2. Numerous hypodense liver lesions, mostly subcentimeter, not clearly seen on more remote prior exams. Findings are suspicious for progressed metastatic disease. The lesion seen on recent PET CT in the right hepatic lobe appears slightly increased in size. 3. Widespread skeletal metastatic disease. 4. Uterine fibroids Electronically Signed   By: Jasmine Pang M.D.   On: 08/16/2023 18:08   NM PET DOTATATE SKULL BASE TO MID THIGH Result Date: 07/31/2023 CLINICAL DATA:  Well differentiated neuroendocrine tumor. Prior peptide receptor radiotherapy completed March 2023. Evidence of skeletal metastasis on most recent DOTATATE PET scan as well as a lesion in the medial RIGHT breast. EXAM: NUCLEAR MEDICINE PET SKULL BASE TO THIGH TECHNIQUE: 4.2 mCi copper 64 DOTATATE was injected intravenously. Full-ring PET imaging was performed from the skull base to thigh after the radiotracer. CT data was obtained and used for attenuation correction and anatomic localization. COMPARISON:  DOTATATE PET scan 02/08/2023 FINDINGS: NECK No radiotracer avid nodes in the neck. Incidental CT findings: None CHEST No radiotracer accumulation within mediastinal or hilar lymph nodes. No suspicious pulmonary nodules on the CT scan. Previous described lesion in the medial RIGHT breast measures 8 mm with SUV max equal 23 compared to 4 mm with SUV max equal 11.5. Incidental CT finding:None  ABDOMEN/PELVIS There is a new solitary lesion radiotracer activity within the RIGHT hepatic lobe with SUV max equal 13.0 on image 79. Chest is subtle low-density lesion identified on CT portion exam measuring 12 mm on image 79/series 4) intense activity through the pancreas is similar prior. No radiotracer avid abdominopelvic lymph nodes. No mesenteric lesion. No abnormal activity in the bowel. Physiologic activity noted in the liver, spleen, adrenal glands and kidneys. Incidental CT findings:None SKELETON Overall there is interval increase in the number of radiotracer avid skeletal metastasis. For example in the posterior RIGHT iliac bone broader lesion with SUV max equal 34 compared SUV max 11.4 prior. Lesions in the proximal femurs appear multiple fluid. Several new lesions in the ribs and sternum. Iliac vertebral body is involved with malignancy radiotracer avid number tumor. Of the widespread sclerotic lesions on the CT portion exam. Incidental CT findings:None IMPRESSION: 1. Overall mild progression of multifocal skeletal metastasis involving the axillary and and appendicular skeleton. These intense radiotracer avid consistent well differentiated tumor. 2. New lesion in the RIGHT hepatic lobe consistent with progressive hepatic metastasis. 3. Interval increase in size of radiotracer avid nodule in the medial RIGHT upper chest.  4. Patient may be a good candidate for re-treatment with the Lu 177 DOTATATE after initial positive response. Electronically Signed   By: Genevive Bi M.D.   On: 07/31/2023 17:03     ASSESSMENT & PLAN Michelle Solomon 63 y.o. female with medical history significant for metastatic neuroendocrine tumor of the colon who presents for a follow up visit.    # Metastatic Neuroendocrine Tumor, Metastasis to the Spine  --previously discussed with patient that the options would include observation with serial imaging or starting lanreotide therapy. The patient opted to start  treatment --will plan for continued lanreotide 120mg  subq q28 days. Started therapy on 12/27/2019. This will be continued until progression or intolerance to therapy. Next dose due on 02/19/2021 --Due to the extensive involvement of her skeleton and increased activity on her last nuclear medicine scan it was recommended that she be considered for Lu 177 dotatate treatment. Initiated treatment on 04/15/2021 with plans to complete a total of 4 treatments.  --CU-64 scan q 3 months. Last in April 2022. Completed PET CT scan at [facility] on 02/02/2021, no evidence of lymphoma.  --Patient expressed interest in a second opinion.  She saw Dr. Lattie Corns at [facility].  He provided excellent advice regarding options moving forward.  After hearing all the different options the patient noted she would like to continue her current lanreotide shots with consideration of increased frequency if she were to be found to be progressing --patient underwent treatment with Lutathera therapy. She is s/p treatment 4 of 4. They administered 30 mg IM long acting sandostatin on days where she undergoes Lutathera treatment (q 8 weeks x 4 doses). Completed the 4th Lutathera treatment on 09/30/21  Plan:  --continue lanreotide 120 mg q 28 days with q 3 month zometa --Last Zometa infusion completed on Oct 2024. Next due  --Labs today show white blood cell count 6.9, hemoglobin 11.4, MCV 82.6, platelets 340. Creatinine and LFTs normal. Chromogranin levels pending today. Last chromogranin level from 06/06/2023 was normal at 20.2. --Last PET dotatate scan did show concern for progression with new lung nodule and some new areas of FDG avidity -- Discussed treatment options moving forward including repeat Lutathera versus everolimus.  The patient notes she would like to pursue Lutathera at this time.  Will reach out to nuclear medicine. --RTC in 12 weeks with interval continued monthly lanreotide injections.   #Mantle Cell  Lymphoma -- Incidental finding from prior biopsy results.  This was detected on secondary pathological review at [facility] when the patient went for second opinion --Unclear if this represents a small low-grade clinically insignificant population of mantle cell lymphoma or a more concerning issue --Patient following with Dr. Nolen Mu at [facility] --PET CT scan on 02/02/2021 showed no evidence of lymphoma.  --We will continue to monitor and appreciate the guidance of Dr. Nolen Mu  #Possible Meningioma: --Seen on MRI brain  from 08/09/2022 at [facility], measuring 3.6 cm along the right frontal convexity with broad-based dural attachment.  --Evaluated by Dr. Sharilyn Sites from [facility] who recommended surgical resection.  --Patient seen for a second opinion at [facility] on 10/30/2022 --current plan for repeat scan in Oct/Nov 2024  No orders of the defined types were placed in this encounter.  All questions were answered. The patient knows to call the clinic with any problems, questions or concerns.  I have spent a total of 30 minutes minutes of face-to-face and non-face-to-face time, preparing to see the patient,performing a medically appropriate  examination, counseling and educating the patient, documenting clinical information in the electronic health record, and care coordination.   Ulysees Barns, MD Department of Hematology/Oncology [facility] at [facility] Phone: [phone] Pager: [phone] Email: [email or web address]    08/23/2023 12:15 PM

## 2023-08-24 ENCOUNTER — Other Ambulatory Visit: Payer: Self-pay | Admitting: Hematology and Oncology

## 2023-08-24 ENCOUNTER — Telehealth: Payer: Self-pay | Admitting: Hematology and Oncology

## 2023-08-24 DIAGNOSIS — C7B8 Other secondary neuroendocrine tumors: Secondary | ICD-10-CM

## 2023-08-24 NOTE — Telephone Encounter (Signed)
 Scheduled appointments per 2/19 LOS notes. Patient is aware of the scheduled appointments.

## 2023-08-25 LAB — CHROMOGRANIN A: Chromogranin A (ng/mL): 20.7 ng/mL (ref 0.0–101.8)

## 2023-08-28 ENCOUNTER — Inpatient Hospital Stay (HOSPITAL_BASED_OUTPATIENT_CLINIC_OR_DEPARTMENT_OTHER): Payer: 59 | Admitting: Nurse Practitioner

## 2023-08-28 ENCOUNTER — Encounter: Payer: Self-pay | Admitting: Nurse Practitioner

## 2023-08-28 VITALS — BP 122/90 | HR 97 | Temp 97.6°F | Resp 16 | Wt 168.4 lb

## 2023-08-28 DIAGNOSIS — Z7189 Other specified counseling: Secondary | ICD-10-CM | POA: Diagnosis not present

## 2023-08-28 DIAGNOSIS — C7A8 Other malignant neuroendocrine tumors: Secondary | ICD-10-CM | POA: Diagnosis not present

## 2023-08-28 DIAGNOSIS — Z515 Encounter for palliative care: Secondary | ICD-10-CM

## 2023-08-28 DIAGNOSIS — K5900 Constipation, unspecified: Secondary | ICD-10-CM

## 2023-08-28 DIAGNOSIS — C7B8 Other secondary neuroendocrine tumors: Secondary | ICD-10-CM

## 2023-08-28 NOTE — Progress Notes (Signed)
 Palliative Medicine [facility]  [phone] [phone]   Name: [name] Date: 08/28/2023 MRN: [id number]  DOB: 02-Dec-1960  Patient Care Team: Corwin Levins, MD as PCP - General    REASON FOR CONSULTATION: Michelle Solomon is a 63 y.o. 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 Palliative ask to see for symptom management and goals of care.     SOCIAL HISTORY:     reports that she has been smoking cigarettes. She started smoking about 43 years ago. She has a 20 pack-year smoking history. She has never used smokeless tobacco. She reports current alcohol use. She reports that she does not use drugs.  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   Cancer (HCC)    Neuro endocrine tumor   CONJUNCTIVITIS, ALLERGIC 11/27/2008   HEMORRHOIDS 06/16/2008   HTN (hypertension)    Hx of adenomatous colonic polyps 11/2019   NIPPLE DISCHARGE 01/21/2010   Other specified forms of hearing loss 10/08/2009   Overweight(278.02) 05/29/2007   RASH-NONVESICULAR 05/29/2007   Wheezing 06/22/2010    PAST SURGICAL HISTORY:  Past Surgical History:  Procedure Laterality Date   COLONOSCOPY     COLONOSCOPY WITH PROPOFOL N/A 08/18/2023   Procedure: COLONOSCOPY WITH PROPOFOL;  Surgeon: Benancio Deeds, MD;  Location: [facility];  Service: Gastroenterology;  Laterality: N/A;   POLYPECTOMY  08/18/2023   Procedure: POLYPECTOMY;  Surgeon: Benancio Deeds, MD;  Location: [facility];  Service:  Gastroenterology;;    HEMATOLOGY/ONCOLOGY HISTORY:  Oncology History   No history exists.    ALLERGIES:  is allergic to shellfish allergy.  MEDICATIONS:  Current Outpatient Medications  Medication Sig Dispense Refill   buPROPion (WELLBUTRIN XL) 300 MG 24 hr tablet TAKE 1 TABLET(300 MG) BY MOUTH DAILY 90 tablet 3   cholecalciferol (VITAMIN D3) 25 MCG (1000 UNIT) tablet Take 1,000 Units by mouth daily.     ferrous sulfate 325 (65 FE) MG tablet Take 1 tablet (325 mg total) by mouth daily with breakfast. Please take with a source of Vitamin C 90 tablet 3   GARLIC PO Take 1 tablet by mouth daily.     GINKGO BILOBA EXTRACT PO Take 1 tablet by mouth daily.     hydrochlorothiazide (HYDRODIURIL) 12.5 MG tablet TAKE 1 TABLET(12.5 MG) BY MOUTH DAILY 30 tablet 0   lanreotide acetate (SOMATULINE DEPOT) 120 MG/0.5ML injection Inject 120 mg into the skin every 28 (twenty-eight) days. 0.5 mL 12   loratadine (CLARITIN) 10 MG tablet Take 10 mg by mouth daily as needed for allergies.     losartan (COZAAR) 50 MG tablet TAKE 1 TABLET(50 MG) BY MOUTH DAILY 90 tablet 3   ondansetron (ZOFRAN) 8 MG tablet Take 1 tablet (8 mg total) by mouth 2 (two) times daily as needed for nausea or vomiting. 20 tablet 0   oxyCODONE-acetaminophen (PERCOCET/ROXICET) 5-325 MG tablet Take 1 tablet by mouth every 6 (six) hours as needed for severe pain. (Patient not taking: Reported on 08/17/2023) 30 tablet 0  polyethylene glycol (MIRALAX) 17 g packet Take 17 g by mouth 2 (two) times daily. 60 packet 0   Turmeric 500 MG CAPS Take 500 mg by mouth daily.     VITAMIN D-VITAMIN K PO Take 1 tablet by mouth daily.     zolpidem (AMBIEN) 10 MG tablet Take 1 tablet (10 mg total) by mouth at bedtime as needed for sleep. 90 tablet 1   No current facility-administered medications for this visit.   Facility-Administered Medications Ordered in Other Visits  Medication Dose Route Frequency Provider Last Rate Last Admin   octreotide  (SANDOSTATIN LAR) 30 MG IM injection             VITAL SIGNS: LMP 10/26/2017  There were no vitals filed for this visit.  Estimated body mass index is 29.6 kg/m as calculated from the following:   Height as of 08/16/23: 5\' 3"  (1.6 m).   Weight as of 08/23/23: 167 lb 1.6 oz (75.8 kg).  LABS: CBC:    Component Value Date/Time   WBC 6.9 08/23/2023 0815   WBC 9.5 08/18/2023 0333   HGB 11.4 (L) 08/23/2023 0815   HCT 35.5 (L) 08/23/2023 0815   PLT 340 08/23/2023 0815   MCV 82.6 08/23/2023 0815   NEUTROABS 2.6 08/23/2023 0815   LYMPHSABS 3.3 08/23/2023 0815   MONOABS 0.7 08/23/2023 0815   EOSABS 0.2 08/23/2023 0815   BASOSABS 0.0 08/23/2023 0815   Comprehensive Metabolic Panel:    Component Value Date/Time   NA 138 08/23/2023 0815   K 4.0 08/23/2023 0815   CL 102 08/23/2023 0815   CO2 28 08/23/2023 0815   BUN 6 (L) 08/23/2023 0815   CREATININE 0.76 08/23/2023 0815   GLUCOSE 148 (H) 08/23/2023 0815   CALCIUM 9.6 08/23/2023 0815   AST 16 08/23/2023 0815   ALT 19 08/23/2023 0815   ALKPHOS 89 08/23/2023 0815   BILITOT 0.4 08/23/2023 0815   PROT 7.4 08/23/2023 0815   ALBUMIN 4.1 08/23/2023 0815    RADIOGRAPHIC STUDIES: NM PET DOTATATE SKULL BASE TO MID THIGH Result Date: 07/31/2023 CLINICAL DATA:  Well differentiated neuroendocrine tumor. Prior peptide receptor radiotherapy completed March 2023. Evidence of skeletal metastasis on most recent DOTATATE PET scan as well as a lesion in the medial RIGHT breast. EXAM: NUCLEAR MEDICINE PET SKULL BASE TO THIGH TECHNIQUE: 4.2 mCi copper 64 DOTATATE was injected intravenously. Full-ring PET imaging was performed from the skull base to thigh after the radiotracer. CT data was obtained and used for attenuation correction and anatomic localization. COMPARISON:  DOTATATE PET scan 02/08/2023 FINDINGS: NECK No radiotracer avid nodes in the neck. Incidental CT findings: None CHEST No radiotracer accumulation within mediastinal or hilar lymph nodes.  No suspicious pulmonary nodules on the CT scan. Previous described lesion in the medial RIGHT breast measures 8 mm with SUV max equal 23 compared to 4 mm with SUV max equal 11.5. Incidental CT finding:None ABDOMEN/PELVIS There is a new solitary lesion radiotracer activity within the RIGHT hepatic lobe with SUV max equal 13.0 on image 79. Chest is subtle low-density lesion identified on CT portion exam measuring 12 mm on image 79/series 4) intense activity through the pancreas is similar prior. No radiotracer avid abdominopelvic lymph nodes. No mesenteric lesion. No abnormal activity in the bowel. Physiologic activity noted in the liver, spleen, adrenal glands and kidneys. Incidental CT findings:None SKELETON Overall there is interval increase in the number of radiotracer avid skeletal metastasis. For example in the posterior RIGHT iliac bone broader lesion  with SUV max equal 34 compared SUV max 11.4 prior. Lesions in the proximal femurs appear multiple fluid. Several new lesions in the ribs and sternum. Iliac vertebral body is involved with malignancy radiotracer avid number tumor. Of the widespread sclerotic lesions on the CT portion exam. Incidental CT findings:None IMPRESSION: 1. Overall mild progression of multifocal skeletal metastasis involving the axillary and and appendicular skeleton. These intense radiotracer avid consistent well differentiated tumor. 2. New lesion in the RIGHT hepatic lobe consistent with progressive hepatic metastasis. 3. Interval increase in size of radiotracer avid nodule in the medial RIGHT upper chest. 4. Patient may be a good candidate for re-treatment with the Lu 177 DOTATATE after initial positive response. Electronically Signed   By: Genevive Bi M.D.   On: 07/31/2023 17:03    PERFORMANCE STATUS (ECOG) : 1 - Symptomatic but completely ambulatory  Review of Systems  Constitutional:  Positive for appetite change.  Gastrointestinal:  Positive for constipation.  Unless  otherwise noted, a complete review of systems is negative.  Physical Exam General: NAD Cardiovascular: regular rate and rhythm Pulmonary: normal breathing pattern Extremities: no edema, no joint deformities Skin: no rashes Neurological: Alert and oriented x3  Discussed the use of AI scribe software for clinical note transcription with the patient, who gave verbal consent to proceed.  IMPRESSION:  This is my initial visit with [name]. She is a 63 year old female with metastatic neuroendocrine tumor of the colon. No family present. Ambulatory without assistance. She is alert and able to appropriately engage in discussions.   I introduced myself, [place] RN, and Palliative's role in collaboration with the oncology team. Concept of Palliative Care was introduced as specialized medical care for people and their families living with serious illness.  It focuses on providing relief from the symptoms and stress of a serious illness.  The goal is to improve quality of life for both the patient and the family. Values and goals of care important to patient and family were attempted to be elicited.     Ms. Kissoon works from home for [employer] and has been with the company for 35 years. She has three grown children and two grandchildren. Her spouse is deceased, and she celebrated his life with a balloon release recently with family.    She has recently changed her diet to an alkaline diet, focusing on fruits, vegetables, and salmon, and has been exercising regularly. She reports a weight loss of about 20 pounds over the past few years. Reports experiencing changes in her appetite but is now eating to maintain health, even when not hungry.  She experienced severe constipation recently, leading to an emergency room visit where she required multiple enemas and a colonoscopy. She now takes two doses of Miralax daily, which is helping. No nausea or vomiting. No current pain is reported,  noting that previous lower back pain has resolved since changing her diet. Much appreciative of this. She was previously taking oxycodone for pain management but now only uses Tylenol as needed. No symptom needs at this time.   Goals of Care We discussed her current illness and what it means in the larger context of her on-going co-morbidities. Natural disease trajectory and expectations were discussed. Ms. Eyster is realistic in her understanding of current cancer diagnosis and plan of care. She shares she has been under medical care for the past three to four years for cancer. A recent PET scan indicated progression of her cancer. She is actively receiving support and  medical management under the care of multiple specialists, including those at [facility] and [facility], for monitoring of her condition.  She has opted for radiation treatment again, which she found effective in the past. She is exploring treatment options and has expressed concerns about the side effects of the recommended oral treatment.   Camelle speaks to her spiritual strength in [place] and taking life one day at a time. She is grateful that she does not feel "sick" despite her diagnosis. Her current quality of life is good. The patient is clear in her expressed wishes to continue to treat the treatable allowing her every opportunity to continue thriving. Her sisters have recently come into to town offering her support. She tries not to spend time pondering on the "what ifs" but taking the moments as they come while making informed and complex medical decisions.   I empathetically approached discussions regarding healthcare limitations. Ms. Pritt reports she does not have a documented advanced directive.  A copy of advance directives provided to patient.  Extensive education provided as we discussed her wishes for life-sustaining measures such as CPR, artificial nutrition and hydration, and organ donation.  She expresses  appreciation of discussions as this is something she should consider in the near future.  Information also provided on our advanced directive clinic.  Patient expresses interest in completing.  MOST also discussed and a copy has been provided for patient to review.  She is aware we can complete this at any appointment when she is ready.  I discussed the importance of continued conversation with family and their medical providers regarding overall plan of care and treatment options, ensuring decisions are within the context of the patients values and GOCs.  PLAN: Established therapeutic relationship. Education provided on palliative's role in collaboration with their Oncology/Radiation team.  Stage 4 Metastatic Cancer Patient reports feeling well despite diagnosis. Recent PET scan showed progression of disease. Patient undecided about oral chemotherapy due to potential side effects and has opted for radiation therapy at this time. Patient has made significant dietary changes and reports improvement in previously experienced lower back pain. -Continue current management plan and monitor response to radiation therapy. -Consider referral to a therapist for emotional support if patient desires in the future. Is appropriately adjusting to diagnosis and coping appropriately. She has a good family support system.   Constipation Recent hospitalization for severe constipation. Currently taking two Miralax daily. -Continue Miralax as prescribed. -Monitor bowel habits and adjust treatment as necessary.  Pain Management Previously experienced lower back pain managed with Oxycodone as needed and Tylenol. Pain has improved with dietary changes. -Continue current pain management plan. -Consider use of Oxycodone for pain management if pain worsens or becomes uncontrolled with Tylenol.  Goals of Care/Advanced Directives Patient does not currently have advanced directives, healthcare power of attorney, or living  will documents. -Ms. Baity is realistic in her understanding of current cancer diagnosis and plan of care. The patient is clear in her expressed wishes to continue to treat the treatable allowing her every opportunity to continue thriving.  -Encourage patient to consider establishing advanced directives and discuss options at next visit. -Education provided on [facility], healthcare limitations, and MOST form. Patient provided with all documents.  -Contact information provided to advanced directives clinic as she is interested in completing.   Follow-up Patient to be seen every 4-6 weeks unless issues arise requiring sooner appointment. Patient encouraged to use MyChart for communication between visits or contact the office directly at [phone]. -Will  plan to see back on March 26th in collaboration with other oncology appointments.  Patient expressed understanding and was in agreement with this plan. She also understands that She can call the clinic at any time with any questions, concerns, or complaints.   Thank you for your referral and allowing Palliative to assist in Mrs. Karuna Buchan's care.   Number and complexity of problems addressed: HIGH - 1 or more chronic illnesses with SEVERE exacerbation, progression, or side effects of treatment - advanced cancer, pain. Any controlled substances utilized were prescribed in the context of palliative care.   Visit consisted of counseling and education dealing with the complex and emotionally intense issues of symptom management and palliative care in the setting of serious and potentially life-threatening illness.  Signed by: Willette Alma, AGPCNP-BC Palliative Medicine Team/[facility]

## 2023-08-29 ENCOUNTER — Ambulatory Visit (HOSPITAL_COMMUNITY)
Admission: RE | Admit: 2023-08-29 | Discharge: 2023-08-29 | Disposition: A | Discharge status: Home / Self Care | Payer: 59 | Source: Ambulatory Visit | Attending: Hematology and Oncology | Admitting: Hematology and Oncology

## 2023-08-29 ENCOUNTER — Other Ambulatory Visit: Payer: Self-pay | Admitting: Hematology and Oncology

## 2023-08-29 ENCOUNTER — Inpatient Hospital Stay: Payer: 59

## 2023-08-29 ENCOUNTER — Inpatient Hospital Stay: Payer: 59 | Admitting: Dietician

## 2023-08-29 VITALS — BP 121/89 | HR 78 | Temp 98.0°F | Resp 16

## 2023-08-29 DIAGNOSIS — C7A8 Other malignant neuroendocrine tumors: Secondary | ICD-10-CM

## 2023-08-29 DIAGNOSIS — C7B8 Other secondary neuroendocrine tumors: Secondary | ICD-10-CM | POA: Insufficient documentation

## 2023-08-29 DIAGNOSIS — C7A025 Malignant carcinoid tumor of the sigmoid colon: Secondary | ICD-10-CM | POA: Diagnosis not present

## 2023-08-29 LAB — CMP (CANCER CENTER ONLY)
ALT: 25 U/L (ref 0–44)
AST: 20 U/L (ref 15–41)
Albumin: 4 g/dL (ref 3.5–5.0)
Alkaline Phosphatase: 85 U/L (ref 38–126)
Anion gap: 6 (ref 5–15)
BUN: 11 mg/dL (ref 8–23)
CO2: 30 mmol/L (ref 22–32)
Calcium: 9.3 mg/dL (ref 8.9–10.3)
Chloride: 104 mmol/L (ref 98–111)
Creatinine: 0.78 mg/dL (ref 0.44–1.00)
GFR, Estimated: >60 mL/min (ref >60)
Glucose, Bld: 180 mg/dL — ABNORMAL HIGH (ref 70–99)
Potassium: 3.8 mmol/L (ref 3.5–5.1)
Sodium: 140 mmol/L (ref 135–145)
Total Bilirubin: 0.3 mg/dL (ref 0.0–1.2)
Total Protein: 7.2 g/dL (ref 6.5–8.1)

## 2023-08-29 LAB — CBC WITH DIFFERENTIAL (CANCER CENTER ONLY)
Abs Immature Granulocytes: 0.03 10*3/uL (ref 0.00–0.07)
Basophils Absolute: 0 10*3/uL (ref 0.0–0.1)
Basophils Relative: 0 %
Eosinophils Absolute: 0.2 10*3/uL (ref 0.0–0.5)
Eosinophils Relative: 2 %
HCT: 35.9 % — ABNORMAL LOW (ref 36.0–46.0)
Hemoglobin: 11.3 g/dL — ABNORMAL LOW (ref 12.0–15.0)
Immature Granulocytes: 1 %
Lymphocytes Relative: 39 %
Lymphs Abs: 2.5 10*3/uL (ref 0.7–4.0)
MCH: 26.7 pg (ref 26.0–34.0)
MCHC: 31.5 g/dL (ref 30.0–36.0)
MCV: 84.7 fL (ref 80.0–100.0)
Monocytes Absolute: 0.6 10*3/uL (ref 0.1–1.0)
Monocytes Relative: 9 %
Neutro Abs: 3.1 10*3/uL (ref 1.7–7.7)
Neutrophils Relative %: 49 %
Platelet Count: 329 10*3/uL (ref 150–400)
RBC: 4.24 MIL/uL (ref 3.87–5.11)
RDW: 14.7 % (ref 11.5–15.5)
WBC Count: 6.3 10*3/uL (ref 4.0–10.5)
nRBC: 0 % (ref 0.0–0.2)

## 2023-08-29 MED ORDER — LANREOTIDE ACETATE 120 MG/0.5ML ~~LOC~~ SOLN
120.0000 mg | Freq: Once | SUBCUTANEOUS | Status: AC
  Administered 2023-08-29: 120 mg via SUBCUTANEOUS
  Filled 2023-08-29: qty 120

## 2023-08-29 NOTE — Progress Notes (Signed)
 Nutrition Assessment   Reason for Assessment: Wt loss    ASSESSMENT: 63 year old female with malignant neuroendocrine tumor of colon metastatic to lymph and bone. She is currently receiving lanreotide q28d + zometa q12w. Pt is under the care of Dr. Leonides Schanz.   Past medical history includes hemorrhoids, HTN, asthma, colonic stricture, IDA, anxiety/depression, vit D deficiency  2/12-2/14 admission with partial large bowel obstruction  Met with patient and son in office. Patient is doing well today. She is tolerating a regular diet. Bowels are moving q2d with daily miralax BID. Patient reports good appetite. She has been following alkaline diet. Reports juicing and lots of raw fruit and vegetable intake. Recalls oatmeal with blueberries for breakfast. Snacks through out the day on celery, broccoli, strawberries. She ate salmon on bed of spinach with [place] dressing for dinner. Patient drinks 4 bottles of water.   Nutrition Focused Physical Exam: deferred    Medications: wellbutrin, D3, hydrodiuril, cozaar, zofran, percocet, miralax   Labs: glucose 180   Anthropometrics:   Height: 5'3" Weight: 168 lb 6.4 oz  UBW: 183 lb (July 2024) BMI: 29.83    NUTRITION DIAGNOSIS: Food and nutrition related knowledge deficit related to cancer as evidenced by no prior need for associated nutrition information    INTERVENTION:  Educated on increased risk for bowel obstruction given recent history. Recommend decreasing high fiber foods, cooking vegetables vs raw or add to smoothies Educated on low protein intake per dietary recall, recommend protein source at every meal - list of foods and offered ideas  Suggested daily ONS as tolerated - samples of Ensure Plant, KF provided  Contact information provided   MONITORING, EVALUATION, GOAL: Pt will tolerate increased calories and protein to minimize further wt loss    Next Visit: To be scheduled as needed. Contact information provided and  encouraged to reach out with nutrition questions/concerns

## 2023-08-29 NOTE — Consult Note (Signed)
 Chief Complaint: Patient with metastatic neuroendocrine tumor was for  evaluation Peptide receptor radiotherapy (PRRT) with ZO109 DOTATATE (Lutathera).    Referring Physician(s):Dorsey    Patient Status: [facility] - Out-pt  History of Present Illness: Michelle Solomon is a 63 y.o. female well differentiated NET.  Patient with prior peptide receptor radiotherapy (Lu 177 DOTATATE).  Patient completed 4 cycles of treatment finishing on 09/30/2021.  Follow-up DOTATATE PET scan demonstrate marked reduction in skeletal metastasis (DOTATATE PET 11/26/2021).  Patient had a long period of non progression.   Most recent DOTATATE PET scan (07/31/2023) demonstrates progression of skeletal metastasis, new liver lesion in lung lesion.     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   Cancer [facility])    Neuro endocrine tumor   CONJUNCTIVITIS, ALLERGIC 11/27/2008   HEMORRHOIDS 06/16/2008   HTN (hypertension)    Hx of adenomatous colonic polyps 11/2019   NIPPLE DISCHARGE 01/21/2010   Other specified forms of hearing loss 10/08/2009   Overweight(278.02) 05/29/2007   RASH-NONVESICULAR 05/29/2007   Wheezing 06/22/2010    Past Surgical History:  Procedure Laterality Date   COLONOSCOPY     COLONOSCOPY WITH PROPOFOL N/A 08/18/2023   Procedure: COLONOSCOPY WITH PROPOFOL;  Surgeon: Benancio Deeds, MD;  Location: [facility];  Service: Gastroenterology;  Laterality: N/A;   POLYPECTOMY  08/18/2023   Procedure: POLYPECTOMY;  Surgeon: Benancio Deeds, MD;  Location: [facility];  Service: Gastroenterology;;    Allergies: Shellfish allergy  Medications: Prior to Admission medications   Medication Sig Start Date End Date Taking? Authorizing Provider   buPROPion (WELLBUTRIN XL) 300 MG 24 hr tablet TAKE 1 TABLET(300 MG) BY MOUTH DAILY 04/13/22   Corwin Levins, MD  cholecalciferol (VITAMIN D3) 25 MCG (1000 UNIT) tablet Take 1,000 Units by mouth daily.    [provider]  ferrous sulfate 325 (65 FE) MG tablet Take 1 tablet (325 mg total) by mouth daily with breakfast. Please take with a source of Vitamin C 07/06/23   Jaci Standard, MD  GARLIC PO Take 1 tablet by mouth daily. 10/26/22   [provider]  Edrick Oh BILOBA EXTRACT PO Take 1 tablet by mouth daily. 10/26/22   [provider]  hydrochlorothiazide (HYDRODIURIL) 12.5 MG tablet TAKE 1 TABLET(12.5 MG) BY MOUTH DAILY 05/10/23   Corwin Levins, MD  lanreotide acetate (SOMATULINE DEPOT) 120 MG/0.5ML injection Inject 120 mg into the skin every 28 (twenty-eight) days. 07/06/21   Briant Cedar, PA-C  loratadine (CLARITIN) 10 MG tablet Take 10 mg by mouth daily as needed for allergies.    [provider]  losartan (COZAAR) 50 MG tablet TAKE 1 TABLET(50 MG) BY MOUTH DAILY 04/24/23   Corwin Levins, MD  ondansetron (ZOFRAN) 8 MG tablet Take 1 tablet (8 mg total) by mouth 2 (two) times daily as needed for nausea or vomiting. 06/10/21   Lorna Few, MD  oxyCODONE-acetaminophen (PERCOCET/ROXICET) 5-325 MG tablet Take 1 tablet by mouth every 6 (six) hours as needed for severe pain. Patient not taking: Reported on 08/17/2023 04/13/22   Corwin Levins, MD  polyethylene glycol (MIRALAX) 17 g packet Take 17 g by mouth 2 (two) times daily. 08/18/23   Gherghe,  Daylene Katayama, MD  Turmeric 500 MG CAPS Take 500 mg by mouth daily.    [provider]  VITAMIN D-VITAMIN K PO Take 1 tablet by mouth daily.    [provider]  zolpidem (AMBIEN) 10 MG tablet Take 1 tablet (10 mg total) by mouth at bedtime as needed for sleep. 04/13/22 05/13/22  Corwin Levins, MD     Family History  Problem Relation Age of Onset   Allergies Sister    Diabetes Sister    Hypertension Sister     Hypertension Mother    Prostate cancer Father    Hypertension Brother    Hypertension Sister    Diabetes Paternal Aunt    Cancer Paternal Aunt        type unknown   Prostate cancer Brother    Stroke Maternal Aunt    Hypertension Maternal Aunt    Colon cancer Neg Hx    Esophageal cancer Neg Hx    Rectal cancer Neg Hx    Stomach cancer Neg Hx     Social History   Socioeconomic History   Marital status: Widowed    Spouse name: Not on file   Number of children: 3   Years of education: Not on file   Highest education level: Not on file  Occupational History   Occupation: CARD REPLACEMEN    Employer: [employer]   Occupation: customer service  Tobacco Use   Smoking status: Some Days    Current packs/day: 0.00    Average packs/day: 0.5 packs/day for 40.0 years (20.0 ttl pk-yrs)    Types: Cigarettes    Start date: 10/22/1979    Last attempt to quit: 10/22/2019    Years since quitting: 3.8   Smokeless tobacco: Never   Tobacco comments:    1 pack weekly.  started smoking at 63 years old  Vaping Use   Vaping status: Never Used  Substance and Sexual Activity   Alcohol use: Yes    Comment: ocassional   Drug use: No   Sexual activity: Not Currently  Other Topics Concern   Not on file  Social History Narrative   Daily caffeine use 3   Patient does not get regular exercise   Social Drivers of Health   Financial Resource Strain: Not on file  Food Insecurity: No Food Insecurity (08/17/2023)   Hunger Vital Sign    Worried About Running Out of Food in the Last Year: Never true    Ran Out of Food in the Last Year: Never true  Transportation Needs: No Transportation Needs (08/17/2023)   PRAPARE - Administrator, Civil Service (Medical): No    Lack of Transportation (Non-Medical): No  Physical Activity: Not on file  Stress: Not on file  Social Connections: Not on file    ECOG Status: 1 - Symptomatic but completely ambulatory  Review of Systems: Defferered  to treatment date Review of Systems  Vital Signs: LMP 10/26/2017   Physical Exam:Differred to treatment date.  Imaging: NM PET DOTATATE SKULL BASE TO MID THIGH Result Date: 07/31/2023 CLINICAL DATA:  Well differentiated neuroendocrine tumor. Prior peptide receptor radiotherapy completed March 2023. Evidence of skeletal metastasis on most recent DOTATATE PET scan as well as a lesion in the medial RIGHT breast. EXAM: NUCLEAR MEDICINE PET SKULL BASE TO THIGH TECHNIQUE: 4.2 mCi copper 64 DOTATATE was injected intravenously. Full-ring PET imaging was performed from the skull base to thigh after the radiotracer. CT data was obtained and used for attenuation  correction and anatomic localization. COMPARISON:  DOTATATE PET scan 02/08/2023 FINDINGS: NECK No radiotracer avid nodes in the neck. Incidental CT findings: None CHEST No radiotracer accumulation within mediastinal or hilar lymph nodes. No suspicious pulmonary nodules on the CT scan. Previous described lesion in the medial RIGHT breast measures 8 mm with SUV max equal 23 compared to 4 mm with SUV max equal 11.5. Incidental CT finding:None ABDOMEN/PELVIS There is a new solitary lesion radiotracer activity within the RIGHT hepatic lobe with SUV max equal 13.0 on image 79. Chest is subtle low-density lesion identified on CT portion exam measuring 12 mm on image 79/series 4) intense activity through the pancreas is similar prior. No radiotracer avid abdominopelvic lymph nodes. No mesenteric lesion. No abnormal activity in the bowel. Physiologic activity noted in the liver, spleen, adrenal glands and kidneys. Incidental CT findings:None SKELETON Overall there is interval increase in the number of radiotracer avid skeletal metastasis. For example in the posterior RIGHT iliac bone broader lesion with SUV max equal 34 compared SUV max 11.4 prior. Lesions in the proximal femurs appear multiple fluid. Several new lesions in the ribs and sternum. Iliac vertebral body  is involved with malignancy radiotracer avid number tumor. Of the widespread sclerotic lesions on the CT portion exam. Incidental CT findings:None IMPRESSION: 1. Overall mild progression of multifocal skeletal metastasis involving the axillary and and appendicular skeleton. These intense radiotracer avid consistent well differentiated tumor. 2. New lesion in the RIGHT hepatic lobe consistent with progressive hepatic metastasis. 3. Interval increase in size of radiotracer avid nodule in the medial RIGHT upper chest. 4. Patient may be a good candidate for re-treatment with the Lu 177 DOTATATE after initial positive response. Electronically Signed   By: Genevive Bi M.D.   On: 07/31/2023 17:03   NM PET DOTATATE SKULL BASE TO MID THIGH Result Date: 02/08/2023 CLINICAL DATA:  59-year-old female with metastatic neuroendocrine tumor. Patient status post peptide receptor radiotherapy completed March 2023 EXAM: NUCLEAR MEDICINE PET SKULL BASE TO THIGH TECHNIQUE: 4.1 mCi copper 64 DOTATATE was injected intravenously. Full-ring PET imaging was performed from the skull base to thigh after the radiotracer. CT data was obtained and used for attenuation correction and anatomic localization. COMPARISON:  DOTATATE PET scan 05/30/2022 FINDINGS: NECK No radiotracer activity in neck lymph nodes. Incidental CT findings: None CHEST New foci of radiotracer activity in the medial upper RIGHT breast chest superficial to the musculature SUV max 11.5 on image 35. Small soft tissue nodule measuring 4 mm at this site. No additional radiotracer avid nodes or nodules. No radiotracer avid pulmonary nodules. Biopsy clip within small mass within the RIGHT breast without radiotracer activity. Incidental CT finding:None ABDOMEN/PELVIS No radiotracer avid abdominopelvic lymph nodes. No abnormal radiotracer activity within liver. No abnormal radiotracer activity associated with the bowel. Physiologic activity noted in the liver, spleen, adrenal  glands and kidneys. Incidental CT findings:Lobular uterus consistent with benign leiyoma. SKELETON Multiple new foci of intense radiotracer activity are scattered throughout the appendicular and axillary skeleton. For example lesion in the RIGHT iliac bone measuring 17 mm with SUV max equal 45 on image 143. New radiotracer avid lesions in centrally ovary vertebral body lesion at L1 with SUV max of 50.4 is new from prior. Lesions involve the ribs shoulders and femurs. Underlying multiple sclerotic lesions throughout the skeleton are similar to comparison exam. Incidental CT findings:None IMPRESSION: 1. Multiple new radiotracer avid skeletal metastasis throughout the appendicular and axial skeleton. Underlying sclerotic lesions are similar. 2. New radiotracer avid subcutaneous  nodule in the medial upper RIGHT chest consistent with a metastatic neuroendocrine tumor. 3. No evidence of visceral metastasis or nodal metastasis. Electronically Signed   By: Genevive Bi M.D.   On: 02/08/2023 15:34     CBC: Recent Labs    08/17/23 0640 08/18/23 0333 08/23/23 0815 08/29/23 1016  WBC 9.4 9.5 6.9 6.3  HGB 10.9* 10.2* 11.4* 11.3*  HCT 35.4* 32.6* 35.5* 35.9*  PLT 206 197 340 329    COAGS: Recent Labs    08/17/23 0640  INR 1.1    BMP: Recent Labs    08/17/23 0640 08/18/23 0333 08/23/23 0815 08/29/23 1016  NA 137 141 138 140  K 3.8 3.6 4.0 3.8  CL 102 108 102 104  CO2 25 23 28 30   GLUCOSE 134* 146* 148* 180*  BUN 9 <5* 6* 11  CALCIUM 9.0 8.5* 9.6 9.3  CREATININE 0.43* 0.36* 0.76 0.78  GFRNONAA >60 >60 >60 >60    LIVER FUNCTION TESTS: Recent Labs    08/16/23 1355 08/18/23 0333 08/23/23 0815 08/29/23 1016  BILITOT 0.8 0.7 0.4 0.3  AST 31 19 16 20   ALT 30 24 19 25   ALKPHOS 93 72 89 85  PROT 8.1 6.6 7.4 7.2  ALBUMIN 3.8 3.2* 4.1 4.0    TUMOR MARKERS: Recent Labs    05/02/23 1237 06/06/23 0930 07/04/23 0911 08/23/23 0815  CHROMOGA 22.2 20.2 25.6 20.7    Assessment  and Plan:  [Patient is a candidate for RETREATMENT with peptide receptor radiotherapy.  Patient has progressive well differentiated neuroendocrine tumor identified within the skeleton and liver. The tumor is positive for DOTATATE/ somatostatin receptors.  Patient had an initial long period of positive response to initial 4 cycle treatment.  Patient will receive two dose Lu 177 DOTATATE.     The patient was counseled on the primary goal of therapy which is prolongation of progression free survival (79% improvement over standard therapy).  Secondary goals would include decrease in tumor burden and decrease in carcinoid symptoms.    Primary of toxicities of therapy were explained to patient including marrow suppression, renal toxicity and hepatic toxicity.  Rare toxicity of myelosuppression and leukemia also explained.  Potential toxicity will be monitored throughout the course of therapy with interval  CBC and CMP laboratory evaluation.    Four therapies will be scheduled  2 months apart over a  six-month interval.  Patient will receive IM Sandostatin injection in the molecular imaging department after each therapy.  Patient will return to oncology clinic 1 month following each therapy for CBC and CMP and IM Sandostatin injection.   I was not able to meet Ms Caffee in person today.  I established contact over the phone through the NM tech.  Patient had no question today and familiar with treatment and risks.  I will address any question which arise and obtain consent the day of treatment.  Thank you for this interesting consult.  I look forward to participating in their care.  A copy of this report was sent to the requesting provider on this date.  Electronically Signed: Patriciaann Clan, MD 08/29/2023, 2:59 PM

## 2023-08-31 LAB — CHROMOGRANIN A: Chromogranin A (ng/mL): 25.2 ng/mL (ref 0.0–101.8)

## 2023-09-04 ENCOUNTER — Other Ambulatory Visit: Payer: Self-pay | Admitting: Internal Medicine

## 2023-09-04 ENCOUNTER — Other Ambulatory Visit: Payer: Self-pay | Admitting: Nurse Practitioner

## 2023-09-04 MED ORDER — OXYCODONE-ACETAMINOPHEN 5-325 MG PO TABS: 1.0000 | ORAL_TABLET | Freq: Four times a day (QID) | ORAL | 0 refills | Status: DC | PRN

## 2023-09-04 NOTE — Telephone Encounter (Signed)
 Needs OV please   - last OV was oct 2023

## 2023-09-08 ENCOUNTER — Telehealth: Payer: Self-pay | Admitting: *Deleted

## 2023-09-08 NOTE — Telephone Encounter (Signed)
 Received message from dietician that pt is having problems with constipation and asked if I would call the patient. TCT patient and spoke to her.  She states she has been taking iron tablets and started on some percocet for pain and is now very constipated. No significant BM 5 days or so.  She is taking Miralax BID without much releif. She tried suppositories with small amount of stool.  Advised to get Mag Citrate. Drink 1/2 of the bottle. If no BM in 2-4 hours, drink the other half.  Advised that she get crampy for a bit until she can pass the stool. Advised that once that is done successfully to add Senna Plus to her nightly routine to start out with 2 tablets at night and hopefully she will have a BM the next day. This should continue while on pain meds and iron. Advised to call on call if not better over the weekend and to call next week to let me know how she is. Pt voiced understanding to all of the above.

## 2023-09-11 NOTE — Written Directive (Addendum)
 LUTATHERA THERAPY   RADIOPHARMACEUTICAL:  Lutetium 177 Dotatate (Lutathera)     PRESCRIBED DOSE FOR ADMINISTRATION:  200 mCi   ROUTE OFADMINISTRATION:  IV   DIAGNOSIS:  metastatic malignant neuroendocrine tumor to lymph nodes   REFERRING PHYSICIAN: Dr. Leonides Schanz   TREATMENT #:1   DATE OF LAST LONG LIVED SOMATOSTATIN INJECTION: 08/29/2023   ADDITIONAL PHYSICIAN COMMENTS/NOTES:    AUTHORIZED USER SIGNATURE & TIME STAMP:

## 2023-09-11 NOTE — Written Directive (Incomplete Revision)
 LUTATHERA THERAPY   RADIOPHARMACEUTICAL:  Lutetium 177 Dotatate (Lutathera)     PRESCRIBED DOSE FOR ADMINISTRATION:  200 mCi   ROUTE OFADMINISTRATION:  IV   DIAGNOSIS:  metastatic malignant neuroendocrine tumor to lymph nodes   REFERRING PHYSICIAN: Dr. Leonides Schanz   TREATMENT #:1   DATE OF LAST LONG LIVED SOMATOSTATIN INJECTION: 08/29/2023   ADDITIONAL PHYSICIAN COMMENTS/NOTES:    AUTHORIZED USER SIGNATURE & TIME STAMP:

## 2023-09-12 ENCOUNTER — Encounter (HOSPITAL_COMMUNITY)
Admission: RE | Admit: 2023-09-12 | Discharge: 2023-09-12 | Disposition: A | Discharge status: Home / Self Care | Payer: 59 | Source: Ambulatory Visit | Attending: Hematology and Oncology | Admitting: Hematology and Oncology

## 2023-09-12 DIAGNOSIS — C7B8 Other secondary neuroendocrine tumors: Secondary | ICD-10-CM | POA: Insufficient documentation

## 2023-09-12 MED ORDER — SODIUM CHLORIDE 0.9 % IV SOLN: INTRAVENOUS | Status: AC

## 2023-09-20 ENCOUNTER — Other Ambulatory Visit: Payer: Self-pay | Admitting: Nurse Practitioner

## 2023-09-20 ENCOUNTER — Telehealth: Payer: Self-pay | Admitting: *Deleted

## 2023-09-20 MED ORDER — OXYCODONE-ACETAMINOPHEN 5-325 MG PO TABS: 1.0000 | ORAL_TABLET | Freq: Four times a day (QID) | ORAL | 0 refills | Status: DC | PRN

## 2023-09-20 NOTE — Telephone Encounter (Signed)
 Received call from pt. She had a couple of concerns to discuss. Pt is having more bone pain. She's reluctant to take the oxy that was prescribed by palliative care team, but I encouraged her to take it and the miralax. She also asked about Zometa-she is wondering if she should be getting it every 3 months? Her last dose was in October 2024.  She also wanted to make sure her Lutathera and Lanreotide schedules were correct.  Advised that I would discuss these concerns with Dr. Leonides Schanz and call her back. She voiced understanding.

## 2023-09-21 ENCOUNTER — Other Ambulatory Visit: Payer: Self-pay | Admitting: Diagnostic Radiology

## 2023-09-21 ENCOUNTER — Encounter (HOSPITAL_COMMUNITY)
Admission: RE | Admit: 2023-09-21 | Discharge: 2023-09-21 | Disposition: A | Discharge status: Home / Self Care | Source: Ambulatory Visit | Attending: Hematology and Oncology | Admitting: Hematology and Oncology

## 2023-09-21 ENCOUNTER — Other Ambulatory Visit: Payer: Self-pay | Admitting: Nurse Practitioner

## 2023-09-21 ENCOUNTER — Other Ambulatory Visit: Payer: Self-pay

## 2023-09-21 DIAGNOSIS — C7B8 Other secondary neuroendocrine tumors: Secondary | ICD-10-CM | POA: Insufficient documentation

## 2023-09-21 LAB — COMPREHENSIVE METABOLIC PANEL
ALT: 26 U/L (ref 0–44)
AST: 78 U/L — ABNORMAL HIGH (ref 15–41)
Albumin: 3.3 g/dL — ABNORMAL LOW (ref 3.5–5.0)
Alkaline Phosphatase: 173 U/L — ABNORMAL HIGH (ref 38–126)
Anion gap: 12 (ref 5–15)
BUN: 8 mg/dL (ref 8–23)
CO2: 24 mmol/L (ref 22–32)
Calcium: 9.3 mg/dL (ref 8.9–10.3)
Chloride: 93 mmol/L — ABNORMAL LOW (ref 98–111)
Creatinine, Ser: 0.45 mg/dL (ref 0.44–1.00)
GFR, Estimated: >60 mL/min (ref >60)
Glucose, Bld: 208 mg/dL — ABNORMAL HIGH (ref 70–99)
Potassium: 3.7 mmol/L (ref 3.5–5.1)
Sodium: 129 mmol/L — ABNORMAL LOW (ref 135–145)
Total Bilirubin: 0.5 mg/dL (ref 0.0–1.2)
Total Protein: 8.1 g/dL (ref 6.5–8.1)

## 2023-09-21 LAB — CBC WITH DIFFERENTIAL/PLATELET
Abs Immature Granulocytes: 0.43 10*3/uL — ABNORMAL HIGH (ref 0.00–0.07)
Basophils Absolute: 0 10*3/uL (ref 0.0–0.1)
Basophils Relative: 0 %
Eosinophils Absolute: 0.1 10*3/uL (ref 0.0–0.5)
Eosinophils Relative: 1 %
HCT: 35 % — ABNORMAL LOW (ref 36.0–46.0)
Hemoglobin: 11.2 g/dL — ABNORMAL LOW (ref 12.0–15.0)
Immature Granulocytes: 3 %
Lymphocytes Relative: 19 %
Lymphs Abs: 2.7 10*3/uL (ref 0.7–4.0)
MCH: 26.8 pg (ref 26.0–34.0)
MCHC: 32 g/dL (ref 30.0–36.0)
MCV: 83.7 fL (ref 80.0–100.0)
Monocytes Absolute: 1.7 10*3/uL — ABNORMAL HIGH (ref 0.1–1.0)
Monocytes Relative: 12 %
Neutro Abs: 9.2 10*3/uL — ABNORMAL HIGH (ref 1.7–7.7)
Neutrophils Relative %: 65 %
Platelets: 525 10*3/uL — ABNORMAL HIGH (ref 150–400)
RBC: 4.18 MIL/uL (ref 3.87–5.11)
RDW: 14.4 % (ref 11.5–15.5)
WBC: 14.1 10*3/uL — ABNORMAL HIGH (ref 4.0–10.5)
nRBC: 0 % (ref 0.0–0.2)

## 2023-09-21 MED ORDER — LUTETIUM LU 177 DOTATATE 370 MBQ/ML IV SOLN
200.0000 | Freq: Once | INTRAVENOUS | Status: AC
  Administered 2023-09-21: 200.41 via INTRAVENOUS

## 2023-09-21 MED ORDER — SODIUM CHLORIDE 0.9 % IV SOLN
500.0000 mL | Freq: Once | INTRAVENOUS | Status: DC
Start: 2023-09-21 — End: 2023-09-27

## 2023-09-21 MED ORDER — OXYCODONE-ACETAMINOPHEN 5-325 MG PO TABS
ORAL_TABLET | ORAL | Status: AC
  Administered 2023-09-21: 1 via ORAL
  Filled 2023-09-21: qty 1

## 2023-09-21 MED ORDER — PROCHLORPERAZINE EDISYLATE 10 MG/2ML IJ SOLN: 10.0000 mg | Freq: Four times a day (QID) | INTRAMUSCULAR | Status: DC | PRN

## 2023-09-21 MED ORDER — OCTREOTIDE ACETATE 30 MG IM KIT
30.0000 mg | PACK | Freq: Once | INTRAMUSCULAR | Status: AC
  Administered 2023-09-21: 30 mg via INTRAMUSCULAR

## 2023-09-21 MED ORDER — OXYCODONE-ACETAMINOPHEN 5-325 MG PO TABS: 1.0000 | ORAL_TABLET | Freq: Once | ORAL | Status: AC

## 2023-09-21 MED ORDER — SODIUM CHLORIDE 0.9 % IV SOLN: 8.0000 mg | Freq: Once | INTRAVENOUS | Status: DC

## 2023-09-21 MED ORDER — HYDROMORPHONE HCL 1 MG/ML IJ SOLN
INTRAMUSCULAR | Status: AC
  Filled 2023-09-21: qty 1

## 2023-09-21 MED ORDER — OCTREOTIDE ACETATE 500 MCG/ML IJ SOLN: 500.0000 ug | Freq: Once | INTRAMUSCULAR | Status: DC | PRN

## 2023-09-21 MED ORDER — OCTREOTIDE ACETATE 30 MG IM KIT
PACK | INTRAMUSCULAR | Status: AC
  Filled 2023-09-21: qty 1

## 2023-09-21 MED ORDER — ONDANSETRON 8 MG/NS 50 ML IVPB
8.0000 mg | Freq: Once | INTRAVENOUS | Status: AC
  Administered 2023-09-21: 8 mg via INTRAVENOUS
  Filled 2023-09-21: qty 54

## 2023-09-21 MED ORDER — ONDANSETRON HCL 8 MG PO TABS: 8.0000 mg | ORAL_TABLET | Freq: Two times a day (BID) | ORAL | 0 refills | Status: AC | PRN

## 2023-09-21 MED ORDER — AMINO ACID RADIOPROTECTANT - L-LYSINE 2.5%/L-ARGININE 2.5% IN NS
250.0000 mL/h | INTRAVENOUS | Status: AC
  Administered 2023-09-21: 250 mL/h via INTRAVENOUS
  Filled 2023-09-21: qty 1000

## 2023-09-21 MED ORDER — HYDROMORPHONE HCL 1 MG/ML IJ SOLN
0.5000 mg | Freq: Once | INTRAMUSCULAR | Status: AC
  Administered 2023-09-21: 0.5 mg via INTRAVENOUS

## 2023-09-21 NOTE — Progress Notes (Signed)
 CLINICAL DATA: [age] year-old [female] with metastatic neuroendocrine tumor. Well differentiated tumor with somatostatin receptor is identified within the [bones and lymph nodes] by DOTATATE PET CT scan.  EXAM: NUCLEAR MEDICINE LUTATHERA ADMINISTRATION  TECHNIQUE: Infusion: The nuclear medicine technologist and I personally verified the dose activity ([202] mCi) to be delivered as specified in the written directive (200 mCi), and verified the patient identification via 2 separate methods.  20 gauge IV were started in the antecubital veins. Anti-emetics were administered by nursing staff. Amino acid renal protection was initiated 30 minutes prior to Lu 177 DOTATATE (Lutathera) infusion and continued continuously for 4 hours. Lutathera infusion was administered over 30 minutes.      The total administered dose was [202] mCi Lu 177 DOTATATE.    The entire IV tubing, venocatheter, stopcock and syringes was removed in total, placed in a disposal bag and sent for assay of the residual activity, which will be reported at a later time in our EMR by the physics staff. Pressure was applied to the venipuncture sites, and a compression bandage placed. Radiation Safety personnel were present to perform the discharge survey, as detailed on their documentation.    Patient received 30 mg IM long-acting Sandostatin injection 4 hours after Lutathera effusion in the nuclear medicine department.   RADIOPHARMACEUTICALS:   [Two hundred two] mCi Lu 177 DOTATATE   FINDINGS: Diagnosis: [Metastatic neuroendocrine tumor.]    Current Infusion: [1]    Planned Infusions: [2]    Patient reports interval abdominal discomfort related to constipation potential ablated to pain medication.  Patient has a significant bone pain related to metastatic neuroendocrine tumor.     Patient was administered pain medication during current encounter and primary oncology team alerted to patient's continued bone  pain.     The patient's most recent blood counts were reviewed and remains a good candidate to proceed with Lutathera. The patient was situated in an infusion suite and administered Lutathera as above. Patient will follow-up with referring oncologist for interval serum laboratories (CBC and CMP) in approximately 4 weeks.       Patient received 30 mg IM long-acting Sandostatin injection 4 hours after Lutathera effusion in the nuclear medicine department.     IMPRESSION: [First] Lu 177 DOTATATE treatment for metastatic neuroendocrine tumor. The patient tolerated the infusion well. The patient will return in 8 weeks for ongoing care.

## 2023-09-21 NOTE — Progress Notes (Signed)
 error

## 2023-09-21 NOTE — Progress Notes (Signed)
 Pt having pain in lower back and buttocks.  Rate of pain 5. Dr Amil Amen made aware pain medication order received.[name] Kitchen  Heat applied to back

## 2023-09-21 NOTE — Progress Notes (Signed)
 Pt states pain level is 2.  Reminded pt that Zofran was called in for her if she needs it for nausea.   Also informed pt that per Willette Alma NP she could take her pain medsication every 4 hours if needed and if pain not relieved that she could could take 2.

## 2023-09-21 NOTE — Written Directive (Addendum)
 LUTATHERA THERAPY   RADIOPHARMACEUTICAL:  Lutetium 177 Dotatate (Lutathera)     PRESCRIBED DOSE FOR ADMINISTRATION:  200 mCi   ROUTE OFADMINISTRATION:  IV   DIAGNOSIS:  metastatic malignant neuroendocrine tumor to lymph nodes   REFERRING PHYSICIAN: Dr. Leonides Schanz   TREATMENT #: 1   DATE OF LAST LONG LIVED SOMATOSTATIN INJECTION: 08/29/2023   ADDITIONAL PHYSICIAN COMMENTS/NOTES:   AUTHORIZED USER SIGNATURE & TIME STAMP:  Patriciaann Clan, MD   09/21/23    8:38 AM

## 2023-09-25 ENCOUNTER — Telehealth: Payer: Self-pay | Admitting: *Deleted

## 2023-09-25 ENCOUNTER — Other Ambulatory Visit: Payer: Self-pay | Admitting: Physician Assistant

## 2023-09-25 ENCOUNTER — Other Ambulatory Visit: Payer: Self-pay | Admitting: Nurse Practitioner

## 2023-09-25 DIAGNOSIS — C7B8 Other secondary neuroendocrine tumors: Secondary | ICD-10-CM

## 2023-09-25 MED ORDER — OXYCODONE-ACETAMINOPHEN 5-325 MG PO TABS: 1.0000 | ORAL_TABLET | ORAL | 0 refills | Status: DC | PRN

## 2023-09-25 NOTE — Telephone Encounter (Signed)
 Received call from Michelle Solomon asking about her injection appt for tomorrow. Advised that we will cancel it as she had the injection last week in Nuclear medicine. She also asked about the zometa infusion. She has not had one since October 2024. Advised that Georga Kaufmann, PA and Dr. Leonides Schanz would discuss this with her tomorrow. Asked Michelle Solomon how her pain was as she was having increased bone pain last week. She states she has 1 percocet left and has been alternating that with plain tylenol. Asked Michelle Solomon if she knew that her percocet had tylenol in it. She said she did not and is concerned now that she has been taking too much Tylenol. Advised that I would let Nolon Bussing, NP in Palliative Care know of her pain med needs.

## 2023-09-26 ENCOUNTER — Inpatient Hospital Stay: Payer: 59 | Admitting: Physician Assistant

## 2023-09-26 ENCOUNTER — Telehealth: Payer: Self-pay | Admitting: Physician Assistant

## 2023-09-26 ENCOUNTER — Inpatient Hospital Stay: Payer: 59

## 2023-09-26 NOTE — Telephone Encounter (Signed)
 Called the patient to reschedule appointments. The patient is aware of the made appointments and is active on MyChart.

## 2023-09-27 ENCOUNTER — Inpatient Hospital Stay: Payer: 59 | Attending: Hematology and Oncology | Admitting: Nurse Practitioner

## 2023-09-27 ENCOUNTER — Encounter: Payer: Self-pay | Admitting: Nurse Practitioner

## 2023-09-27 VITALS — BP 152/102 | HR 111 | Temp 97.8°F | Resp 17 | Wt 160.9 lb

## 2023-09-27 DIAGNOSIS — Z515 Encounter for palliative care: Secondary | ICD-10-CM | POA: Diagnosis not present

## 2023-09-27 DIAGNOSIS — G893 Neoplasm related pain (acute) (chronic): Secondary | ICD-10-CM | POA: Diagnosis not present

## 2023-09-27 DIAGNOSIS — C7B8 Other secondary neuroendocrine tumors: Secondary | ICD-10-CM

## 2023-09-27 DIAGNOSIS — R530 Neoplastic (malignant) related fatigue: Secondary | ICD-10-CM | POA: Diagnosis not present

## 2023-09-27 DIAGNOSIS — C7A8 Other malignant neuroendocrine tumors: Secondary | ICD-10-CM

## 2023-09-27 DIAGNOSIS — Z79899 Other long term (current) drug therapy: Secondary | ICD-10-CM | POA: Insufficient documentation

## 2023-09-27 DIAGNOSIS — C7B03 Secondary carcinoid tumors of bone: Secondary | ICD-10-CM | POA: Insufficient documentation

## 2023-09-27 DIAGNOSIS — K5903 Drug induced constipation: Secondary | ICD-10-CM

## 2023-09-27 DIAGNOSIS — C7A025 Malignant carcinoid tumor of the sigmoid colon: Secondary | ICD-10-CM | POA: Insufficient documentation

## 2023-09-27 DIAGNOSIS — Z8572 Personal history of non-Hodgkin lymphomas: Secondary | ICD-10-CM | POA: Insufficient documentation

## 2023-09-27 DIAGNOSIS — F17210 Nicotine dependence, cigarettes, uncomplicated: Secondary | ICD-10-CM | POA: Insufficient documentation

## 2023-09-27 DIAGNOSIS — D320 Benign neoplasm of cerebral meninges: Secondary | ICD-10-CM | POA: Insufficient documentation

## 2023-09-27 NOTE — Progress Notes (Signed)
 Palliative Medicine [facility]  [phone] [phone]   Name: Michelle Solomon Date: 09/27/2023 MRN: [id number]  DOB: Feb 21, 1961  Patient Care Team: Corwin Levins, MD as PCP - General Pickenpack-Cousar, Michelle Baumgartner, NP as Nurse Practitioner (Hospice and Palliative Medicine)    INTERVAL HISTORY: Michelle Solomon is a 64 y.o. female with   SOCIAL HISTORY:     reports that she has been smoking cigarettes. She started smoking about 43 years ago. She has a 20 pack-year smoking history. She has never used smokeless tobacco. She reports current alcohol use. She reports that she does not use drugs.  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  MEDICATIONS:  Current Outpatient Medications  Medication Sig Dispense Refill   buPROPion (WELLBUTRIN XL) 300 MG 24 hr tablet TAKE 1 TABLET(300 MG) BY MOUTH DAILY 90 tablet 3   cholecalciferol (VITAMIN D3) 25 MCG (1000 UNIT) tablet Take 1,000 Units by mouth daily.     ferrous sulfate 325 (65 FE) MG tablet Take 1 tablet (325 mg total) by mouth daily with breakfast. Please take with a source of Vitamin C 90 tablet 3   GARLIC PO Take 1 tablet by mouth daily.     GINKGO BILOBA EXTRACT PO Take 1 tablet by mouth  daily.     hydrochlorothiazide (HYDRODIURIL) 12.5 MG tablet TAKE 1 TABLET(12.5 MG) BY MOUTH DAILY 30 tablet 0   lanreotide acetate (SOMATULINE DEPOT) 120 MG/0.5ML injection Inject 120 mg into the skin every 28 (twenty-eight) days. 0.5 mL 12   loratadine (CLARITIN) 10 MG tablet Take 10 mg by mouth daily as needed for allergies.     losartan (COZAAR) 50 MG tablet TAKE 1 TABLET(50 MG) BY MOUTH DAILY 90 tablet 3   ondansetron (ZOFRAN) 8 MG tablet Take 1 tablet (8 mg total) by mouth 2 (two) times daily as needed for nausea or vomiting. 20 tablet 0   ondansetron (ZOFRAN) 8 MG tablet Take 1 tablet (8 mg total) by mouth 2 (two) times daily as needed for nausea or vomiting. 20 tablet 0   oxyCODONE-acetaminophen (PERCOCET/ROXICET) 5-325 MG tablet Take 1-2 tablets by mouth every 4 (four) hours as needed for severe pain (pain score 7-10) or moderate pain (pain score 4-6). 60 tablet 0   polyethylene glycol (MIRALAX) 17 g packet Take 17 g by mouth 2 (two) times daily. 60 packet 0   Turmeric 500 MG CAPS Take 500 mg by mouth daily.     VITAMIN D-VITAMIN K PO Take 1 tablet by mouth daily.     zolpidem (AMBIEN) 10 MG tablet Take 1 tablet (10 mg total) by mouth at bedtime as needed for sleep. 90 tablet 1   No current facility-administered medications  for this visit.   Facility-Administered Medications Ordered in Other Visits  Medication Dose Route Frequency Provider Last Rate Last Admin   octreotide (SANDOSTATIN LAR) 30 MG IM injection             VITAL SIGNS: BP (!) 152/102 (BP Location: Left Arm, Patient Position: Sitting) Comment: Nurse was notified  Pulse (!) 111 Comment: Nurse was notified  Temp 97.8 F (36.6 C) (Temporal)   Resp 17   Wt 160 lb 14.4 oz (73 kg)   LMP 10/26/2017   SpO2 100%   BMI 28.50 kg/m  Filed Weights   09/27/23 1022  Weight: 160 lb 14.4 oz (73 kg)    Estimated body mass index is 28.5 kg/m as calculated from the following:   Height as of 08/16/23: 5\' 3"  (1.6 m).   Weight as  of this encounter: 160 lb 14.4 oz (73 kg).   PERFORMANCE STATUS (ECOG) : 1 - Symptomatic but completely ambulatory  Physical Exam General: NAD Cardiovascular: regular rate and rhythm Pulmonary: normal breathing pattern Extremities: no edema, no joint deformities Skin: no rashes Neurological: AAO x3  IMPRESSION: Discussed the use of AI scribe software for clinical note transcription with the patient, who gave verbal consent to proceed.  History of Present Illness Michelle Solomon is a 63 year old female who presents to clinic for symptom management follow-up. No acute distress. No nausea or vomiting. She mentions having nausea medication but has not needed to use it.  She experiences constipation, which she attributes to her pain medication. She had a bowel movement on Sunday night after using a suppository but has not had one since. She is attempting to manage this by drinking a gallon of water daily, consuming more green vegetables, and using green juices. She has Miralax, Senokot, and Colace available but has not been consistent with their use. She plans to start taking Miralax daily in the morning. Advised if no improvement in regimen to increase to twice daily followed by Senna 2 tablets at bedtime.   Michelle Solomon states her pain is currently well controlled. She is appreciative of this. She experiences excruciating bone pain which has become unexpectedly severe and impacts her ability to perform daily activities. She is currently on oxycodone 5-10 mg every four hours with use of extra strength Tylenol  which helps manage her pain and allows her to move and perform daily activities. No adjustments needed at this time.   She has a history of receiving Zometa injections every three months, with the last dose administered in October. She questions whether the absence of recent Zometa injections could be contributing to her current pain levels. She also takes Caltrate and vitamin D3 supplements to  support bone health. Scheduled for Zometa and follow-up with Oncology on Friday.   All questions answered and support provided.   I discussed the importance of continued conversation with family and their medical providers regarding overall plan of care and treatment options, ensuring decisions are within the context of the patients values and GOCs.  Assessment and Plan Assessment & Plan Cancer Related Pain management Significant bone pain managed with oxycodone and acetaminophen. Current regimen effective.  - Continue oxycodone 5/325mg  every four hours as needed.  -Tylenol extra strength as needed with awareness of dose limitations. - Discuss Zometa with oncologist on Friday. - Continue Caltrate and vitamin D3 supplementation.  Constipation Constipation likely due to pain medication. Recommended consistent use of Miralax. - Take Miralax once daily in the morning with juice or  coffee. - If no improvement, increase Miralax to twice daily. - If Miralax is ineffective, consider Senokot two tablets at bedtime.  Advance directives Provided guidance on completing advance directives and notarization process. - Provide advance directive forms. MOST forms.  - Schedule notarization appointment on April 7th.  Follow-up Follow-up appointment scheduled for April 22nd for lab work and injection. Advised to contact for pain medication refill if needed. - Attend follow-up appointment on April 22nd for lab and injection.  Patient expressed understanding and was in agreement with this plan. She also understands that She can call the clinic at any time with any questions, concerns, or complaints.   Any controlled substances utilized were prescribed in the context of palliative care. PDMP has been reviewed.    Visit consisted of counseling and education dealing with the complex and emotionally intense issues of symptom management and palliative care in the setting of serious and potentially  life-threatening illness.  Willette Alma, AGPCNP-BC  Palliative Medicine Team/[facility]

## 2023-09-29 ENCOUNTER — Inpatient Hospital Stay: Admitting: Physician Assistant

## 2023-09-29 ENCOUNTER — Inpatient Hospital Stay

## 2023-09-29 VITALS — BP 110/78 | HR 105 | Temp 97.0°F | Resp 16 | Wt 159.4 lb

## 2023-09-29 DIAGNOSIS — Z8572 Personal history of non-Hodgkin lymphomas: Secondary | ICD-10-CM | POA: Diagnosis not present

## 2023-09-29 DIAGNOSIS — D649 Anemia, unspecified: Secondary | ICD-10-CM | POA: Diagnosis not present

## 2023-09-29 DIAGNOSIS — C7B8 Other secondary neuroendocrine tumors: Secondary | ICD-10-CM | POA: Diagnosis not present

## 2023-09-29 DIAGNOSIS — F17210 Nicotine dependence, cigarettes, uncomplicated: Secondary | ICD-10-CM | POA: Diagnosis not present

## 2023-09-29 DIAGNOSIS — C7B03 Secondary carcinoid tumors of bone: Secondary | ICD-10-CM | POA: Diagnosis present

## 2023-09-29 DIAGNOSIS — Z79899 Other long term (current) drug therapy: Secondary | ICD-10-CM | POA: Diagnosis not present

## 2023-09-29 DIAGNOSIS — D320 Benign neoplasm of cerebral meninges: Secondary | ICD-10-CM | POA: Diagnosis not present

## 2023-09-29 DIAGNOSIS — C7A025 Malignant carcinoid tumor of the sigmoid colon: Secondary | ICD-10-CM | POA: Diagnosis present

## 2023-09-29 DIAGNOSIS — C7A8 Other malignant neuroendocrine tumors: Secondary | ICD-10-CM | POA: Diagnosis not present

## 2023-09-29 LAB — CBC WITH DIFFERENTIAL (CANCER CENTER ONLY)
Abs Immature Granulocytes: 0.07 10*3/uL (ref 0.00–0.07)
Basophils Absolute: 0 10*3/uL (ref 0.0–0.1)
Basophils Relative: 0 %
Eosinophils Absolute: 0 10*3/uL (ref 0.0–0.5)
Eosinophils Relative: 1 %
HCT: 30.6 % — ABNORMAL LOW (ref 36.0–46.0)
Hemoglobin: 9.8 g/dL — ABNORMAL LOW (ref 12.0–15.0)
Immature Granulocytes: 1 %
Lymphocytes Relative: 25 %
Lymphs Abs: 1.3 10*3/uL (ref 0.7–4.0)
MCH: 26.4 pg (ref 26.0–34.0)
MCHC: 32 g/dL (ref 30.0–36.0)
MCV: 82.5 fL (ref 80.0–100.0)
Monocytes Absolute: 0.6 10*3/uL (ref 0.1–1.0)
Monocytes Relative: 11 %
Neutro Abs: 3.2 10*3/uL (ref 1.7–7.7)
Neutrophils Relative %: 62 %
Platelet Count: 427 10*3/uL — ABNORMAL HIGH (ref 150–400)
RBC: 3.71 MIL/uL — ABNORMAL LOW (ref 3.87–5.11)
RDW: 14.4 % (ref 11.5–15.5)
WBC Count: 5.2 10*3/uL (ref 4.0–10.5)
nRBC: 0 % (ref 0.0–0.2)

## 2023-09-29 LAB — CMP (CANCER CENTER ONLY)
ALT: 22 U/L (ref 0–44)
AST: 23 U/L (ref 15–41)
Albumin: 3.3 g/dL — ABNORMAL LOW (ref 3.5–5.0)
Alkaline Phosphatase: 180 U/L — ABNORMAL HIGH (ref 38–126)
Anion gap: 7 (ref 5–15)
BUN: 10 mg/dL (ref 8–23)
CO2: 31 mmol/L (ref 22–32)
Calcium: 9.3 mg/dL (ref 8.9–10.3)
Chloride: 99 mmol/L (ref 98–111)
Creatinine: 0.62 mg/dL (ref 0.44–1.00)
GFR, Estimated: >60 mL/min (ref >60)
Glucose, Bld: 146 mg/dL — ABNORMAL HIGH (ref 70–99)
Potassium: 4.4 mmol/L (ref 3.5–5.1)
Sodium: 137 mmol/L (ref 135–145)
Total Bilirubin: 0.3 mg/dL (ref 0.0–1.2)
Total Protein: 7.3 g/dL (ref 6.5–8.1)

## 2023-09-29 NOTE — Progress Notes (Signed)
 [facility] [phone]   [phone]  PROGRESS NOTE  Patient Care Team: Corwin Levins, MD as PCP - General Pickenpack-Cousar, Arty Baumgartner, NP as Nurse Practitioner [facility])  Hematological/Oncological History # Metastatic Neuroendocrine Tumor, Metastasis to the Spine 1) 08/26/2019: CT Abdomen Pelvis performed due to RUQ abdominal pain. Imaging showed a soft tissue mass abutting the right posterior eighth rib 2) 09/06/2019: CT chest performed which showed Ill-defined soft tissue nodule along the medial right posterior thoracic wall at the 8th intercostal space to the right of the spine. MRI was recommended. 3) 10/19/2019: MRI Thoracic spine showed enhancing lesions throughout the visualized spine consistent with metastatic disease. Additionally there was a 1.5 cm soft tissue nodule in the posteromedial right eighth intercostal space, more concerning for a metastasis 4) 10/25/2019: establish care with Dr. Leonides Schanz   5) 11/05/2019: attempted biopsy of soft tissue mass at right posterior 8th rib, but lesion had dissipated at time of biopsy attempt 6) 11/27/2019: PET CT scan performed showing right inguinal lymph nodes and osseous lesions, indicative of metastatic disease of unknown primary. 7) 12/11/2019: CT bone marrow biopsy showed metastatic neuroendocrine neoplasm 8) 12/27/2019: start of lanreotide 120mg  sub q28 days  9) 10/15/2020: NM PET (CU-64) scan showed widespread osseous and lymphatic uptake consistent with metastatic spread of neuroendocrine tumor.  This is more avid than prior. 01/20/2021: clinic visit with Dr. Nolen Mu due to concern for possible mantle cell lymphoma seen on pathology review of previous biopsy 04/15/2021: Dose 1 (of 4) of lutathera treatment with nuclear medicine.  06/10/2021: Dose 2 (of 4) of lutathera treatment with nuclear medicine.  08/05/2021: Dose 3 (of 4) of lutathera treatment with nuclear medicine.  09/03/2021:Dose 4  (of 4) of lutathera treatment with nuclear medicine.  11/26/2021: NM Copper Dotatate Scan shows dramatic reduction in number and activity of skeletal metastasis.Patient continues on monthly lanreotide injections 07/31/2023: PET Dotatate: Overall mild progression of multifocal skeletal metastasis involving the axillary and and appendicular skeleton. These intense radiotracer avid consistent well differentiated tumor. New lesion in the RIGHT hepatic lobe consistent with progressive hepatic metastasis. Interval increase in size of radiotracer avid nodule in the medial RIGHT upper chest 08/16/2023: MRI lumbar and thoracic spine: Widespread osseous metastatic disease throughout the regional osseous structures. No evidence of acute pathologic fracture. No extraosseous tumor extension. 09/21/2023: Dose 1 (of 2) of lutathera treatment with nuclear medicine.   #Low Grade Neuroendocrine Tumor 1) 2006: reportedly had rectal carcinoid tumor removed 2) 08/14/2008: patient had a flexibile sigmoidoscopy with endoscopic ultrasound which resected an 8mm, subepithelial lesion in rectum. Findings consistent with low grade neuroendocrine tumor. 3) 04/08/2010: repeat colonoscopy, no residual tumor. Repeat recommended in 2016.  4) 12/11/2019: metastatic recurrence found on biopsy, noted above. 5) 11/26/2021:  NM PET CU-64 showed a dramatic reduction in the number and activity as skeletal metastases.  #Mantle Cell Lymphoma 11/24/2020: incidental findings of low level mantle cell lymphoma on bone marrow biopsy. Noted on outside review of pathology at [facility].  01/20/2021: clinic visit with Dr. Nolen Mu due to concern for possible mantle cell lymphoma seen on pathology review of previous biopsy  # Meningioma  08/09/2022: MRI Brain ordered due to PET scan showing concern for meningioma. Scan showed dural-based mass at the right posterior frontal vertex, performed at [facility]  10/24/2022: 2nd opinion at [facility] 11/04/2022: MRI  Brain showed meningioma measuring 3.5 x 3.5 x 2.1 cm.   Interval History:  Michelle Solomon 64 y.o. female with medical  history significant for metastatic neuroendocrine tumor of the colon who presents for a follow up visit. The patient's last visit was on 08/23/2023. In the interim since the last visit she restarted lutathera treatment for recent progression of disease. She is accompanied by her sister for this visit.   On exam today [name] reports she continues to have low back pain and bilateral hip pain. This started before restarting lutathera treatment this month. She currently takes oxycodone 5-10 mg every 4 hours with relief of pain. She is able to complete her ADLs if her pain is controlled. She has a good appetite but lost approximately 9 lbs since last month. She denies nausea, vomiting or abdominal pain. She reports having constipation but that has improved with increased intake of fiber and use of miralax. She denies easy bruising or signs of bleeding.   She denies any fevers, chills, shortness of breath, chest pain, palpitations or cough. She has no other complaints.   A full 10 point ROS is listed below.  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   Cancer (HCC)    Neuro endocrine tumor   CONJUNCTIVITIS, ALLERGIC 11/27/2008   HEMORRHOIDS 06/16/2008   HTN (hypertension)    Hx of adenomatous colonic polyps 11/2019   NIPPLE DISCHARGE 01/21/2010   Other specified forms of hearing loss 10/08/2009   Overweight(278.02) 05/29/2007   RASH-NONVESICULAR 05/29/2007   Wheezing 06/22/2010    SURGICAL HISTORY: Past Surgical History:  Procedure Laterality Date   COLONOSCOPY     COLONOSCOPY WITH PROPOFOL N/A 08/18/2023   Procedure:  COLONOSCOPY WITH PROPOFOL;  Surgeon: Benancio Deeds, MD;  Location: [facility];  Service: Gastroenterology;  Laterality: N/A;   POLYPECTOMY  08/18/2023   Procedure: POLYPECTOMY;  Surgeon: Benancio Deeds, MD;  Location: [facility];  Service: Gastroenterology;;    SOCIAL HISTORY: Social History   Socioeconomic History   Marital status: Widowed    Spouse name: Not on file   Number of children: 3   Years of education: Not on file   Highest education level: Not on file  Occupational History   Occupation: CARD REPLACEMEN    Employer: [employer]   Occupation: customer service  Tobacco Use   Smoking status: Some Days    Current packs/day: 0.00    Average packs/day: 0.5 packs/day for 40.0 years (20.0 ttl pk-yrs)    Types: Cigarettes    Start date: 10/22/1979    Last attempt to quit: 10/22/2019    Years since quitting: 3.9   Smokeless tobacco: Never   Tobacco comments:    1 pack weekly.  started smoking at 63 years old  Vaping Use   Vaping status: Never Used  Substance and Sexual Activity   Alcohol use: Yes    Comment: ocassional   Drug use: No   Sexual activity: Not Currently  Other Topics Concern   Not on file  Social History Narrative   Daily caffeine use 3   Patient does not get regular exercise   Social Drivers of Health   Financial Resource Strain: Not on file  Food Insecurity: No Food Insecurity (08/17/2023)   Hunger Vital Sign    Worried About Running Out of Food  in the Last Year: Never true    Ran Out of Food in the Last Year: Never true  Transportation Needs: No Transportation Needs (08/17/2023)   PRAPARE - Administrator, Civil Service (Medical): No    Lack of Transportation (Non-Medical): No  Physical Activity: Not on file  Stress: Not on file  Social Connections: Not on file  Intimate Partner Violence: Not At Risk (08/17/2023)   Humiliation, Afraid, Rape, and Kick questionnaire    Fear of Current or Ex-Partner: No    Emotionally  Abused: No    Physically Abused: No    Sexually Abused: No    FAMILY HISTORY: Family History  Problem Relation Age of Onset   Allergies Sister    Diabetes Sister    Hypertension Sister    Hypertension Mother    Prostate cancer Father    Hypertension Brother    Hypertension Sister    Diabetes Paternal Aunt    Cancer Paternal Aunt        type unknown   Prostate cancer Brother    Stroke Maternal Aunt    Hypertension Maternal Aunt    Colon cancer Neg Hx    Esophageal cancer Neg Hx    Rectal cancer Neg Hx    Stomach cancer Neg Hx     ALLERGIES:  is allergic to shellfish allergy.  MEDICATIONS:  Current Outpatient Medications  Medication Sig Dispense Refill   buPROPion (WELLBUTRIN XL) 300 MG 24 hr tablet TAKE 1 TABLET(300 MG) BY MOUTH DAILY 90 tablet 3   cholecalciferol (VITAMIN D3) 25 MCG (1000 UNIT) tablet Take 1,000 Units by mouth daily.     ferrous sulfate 325 (65 FE) MG tablet Take 1 tablet (325 mg total) by mouth daily with breakfast. Please take with a source of Vitamin C 90 tablet 3   GARLIC PO Take 1 tablet by mouth daily.     GINKGO BILOBA EXTRACT PO Take 1 tablet by mouth daily.     hydrochlorothiazide (HYDRODIURIL) 12.5 MG tablet TAKE 1 TABLET(12.5 MG) BY MOUTH DAILY 30 tablet 0   lanreotide acetate (SOMATULINE DEPOT) 120 MG/0.5ML injection Inject 120 mg into the skin every 28 (twenty-eight) days. 0.5 mL 12   loratadine (CLARITIN) 10 MG tablet Take 10 mg by mouth daily as needed for allergies.     losartan (COZAAR) 50 MG tablet TAKE 1 TABLET(50 MG) BY MOUTH DAILY 90 tablet 3   ondansetron (ZOFRAN) 8 MG tablet Take 1 tablet (8 mg total) by mouth 2 (two) times daily as needed for nausea or vomiting. 20 tablet 0   ondansetron (ZOFRAN) 8 MG tablet Take 1 tablet (8 mg total) by mouth 2 (two) times daily as needed for nausea or vomiting. 20 tablet 0   oxyCODONE-acetaminophen (PERCOCET/ROXICET) 5-325 MG tablet Take 1-2 tablets by mouth every 4 (four) hours as needed for  severe pain (pain score 7-10) or moderate pain (pain score 4-6). 60 tablet 0   polyethylene glycol (MIRALAX) 17 g packet Take 17 g by mouth 2 (two) times daily. 60 packet 0   Turmeric 500 MG CAPS Take 500 mg by mouth daily.     VITAMIN D-VITAMIN K PO Take 1 tablet by mouth daily.     zolpidem (AMBIEN) 10 MG tablet Take 1 tablet (10 mg total) by mouth at bedtime as needed for sleep. 90 tablet 1   No current facility-administered medications for this visit.   Facility-Administered Medications Ordered in Other Visits  Medication Dose Route Frequency Provider Last  Rate Last Admin   octreotide (SANDOSTATIN LAR) 30 MG IM injection             REVIEW OF SYSTEMS:   Constitutional: ( - ) fevers, ( - )  chills , ( - ) night sweats Eyes: ( - ) blurriness of vision, ( - ) double vision, ( - ) watery eyes Ears, nose, mouth, throat, and face: ( - ) mucositis, ( - ) sore throat Respiratory: ( - ) cough, ( - ) dyspnea, ( - ) wheezes Cardiovascular: ( - ) palpitation, ( - ) chest discomfort, ( - ) lower extremity swelling Gastrointestinal:  ( - ) nausea, ( - ) heartburn, ( - ) change in bowel habits Skin: ( - ) abnormal skin rashes Lymphatics: ( - ) new lymphadenopathy, ( - ) easy bruising Neurological: ( - ) numbness, ( - ) tingling, ( - ) new weaknesses Behavioral/Psych: ( - ) mood change, ( - ) new changes  All other systems were reviewed with the patient and are negative.  PHYSICAL EXAMINATION: ECOG PERFORMANCE STATUS: 1 - Symptomatic but completely ambulatory  Vitals:   09/29/23 0843  BP: 110/78  Pulse: (!) 105  Resp: 16  Temp: (!) 97 F (36.1 C)  SpO2: 100%   Filed Weights   09/29/23 0843  Weight: 159 lb 6.4 oz (72.3 kg)    GENERAL: well appearing middle aged [place] American female in NAD  SKIN: skin color, texture, turgor are normal, no rashes or significant lesions EYES: conjunctiva are pink and non-injected, sclera clear LUNGS: clear to auscultation and percussion with normal  breathing effort HEART: regular rate & rhythm and no murmurs and no lower extremity edema Musculoskeletal: no cyanosis of digits and no clubbing  PSYCH: alert & oriented x 3, fluent speech NEURO: no focal motor/sensory deficits  LABORATORY DATA:  I have reviewed the data as listed    Latest Ref Rng & Units 09/29/2023    8:09 AM 09/21/2023    9:00 AM 08/29/2023   10:16 AM  CBC  WBC 4.0 - 10.5 K/uL 5.2  14.1  6.3   Hemoglobin 12.0 - 15.0 g/dL 9.8  56.2  13.0   Hematocrit 36.0 - 46.0 % 30.6  35.0  35.9   Platelets 150 - 400 K/uL 427  525  329        Latest Ref Rng & Units 09/29/2023    8:09 AM 09/21/2023    9:00 AM 08/29/2023   10:16 AM  CMP  Glucose 70 - 99 mg/dL 865  784  696   BUN 8 - 23 mg/dL 10  8  11    Creatinine 0.44 - 1.00 mg/dL 2.95  2.84  1.32   Sodium 135 - 145 mmol/L 137  129  140   Potassium 3.5 - 5.1 mmol/L 4.4  3.7  3.8   Chloride 98 - 111 mmol/L 99  93  104   CO2 22 - 32 mmol/L 31  24  30    Calcium 8.9 - 10.3 mg/dL 9.3  9.3  9.3   Total Protein 6.5 - 8.1 g/dL 7.3  8.1  7.2   Total Bilirubin 0.0 - 1.2 mg/dL 0.3  0.5  0.3   Alkaline Phos 38 - 126 U/L 180  173  85   AST 15 - 41 U/L 23  78  20   ALT 0 - 44 U/L 22  26  25      RADIOGRAPHIC STUDIES: I have personally reviewed the radiological images as  listed and agreed with the findings in the report.  NM LUTATHERA ADMINISTRATION Result Date: 09/21/2023 CLINICAL DATA:  63 year-old female with metastatic neuroendocrine tumor. Well differentiated tumor with somatostatin receptor is identified within the bones and lymph nodes by DOTATATE PET CT scan. EXAM: NUCLEAR MEDICINE LUTATHERA ADMINISTRATION TECHNIQUE: Infusion: The nuclear medicine technologist and I personally verified the dose activity (202 mCi) to be delivered as specified in the written directive (200 mCi), and verified the patient identification via 2 separate methods. 20 gauge IV were started in the antecubital veins. Anti-emetics were administered by  nursing staff. Amino acid renal protection was initiated 30 minutes prior to Lu 177 DOTATATE (Lutathera) infusion and continued continuously for 4 hours. Lutathera infusion was administered over 30 minutes. The total administered dose was 202 mCi Lu 177 DOTATATE. The entire IV tubing, venocatheter, stopcock and syringes was removed in total, placed in a disposal bag and sent for assay of the residual activity, which will be reported at a later time in our EMR by the physics staff. Pressure was applied to the venipuncture sites, and a compression bandage placed. Radiation Safety personnel were present to perform the discharge survey, as detailed on their documentation. Patient received 30 mg IM long-acting Sandostatin injection 4 hours after Lutathera effusion in the nuclear medicine department. RADIOPHARMACEUTICALS:  Two hundred two mCi Lu 177 DOTATATE FINDINGS: Diagnosis: Metastatic neuroendocrine tumor. Current Infusion: 1 Planned Infusions: 2 Patient reports interval abdominal discomfort related to constipation potential ablated to pain medication. Patient has a significant bone pain related to metastatic neuroendocrine tumor. Patient was administered pain medication during current encounter and primary oncology team alerted to patient's continued bone pain. The patient's most recent blood counts were reviewed and remains a good candidate to proceed with Lutathera. The patient was situated in an infusion suite and administered Lutathera as above. Patient will follow-up with referring oncologist for interval serum laboratories (CBC and CMP) in approximately 4 weeks. Patient received 30 mg IM long-acting Sandostatin injection 4 hours after Lutathera effusion in the nuclear medicine department. IMPRESSION: First Lu 177 DOTATATE treatment for metastatic neuroendocrine tumor. The patient tolerated the infusion well. The patient will return in 8 weeks for ongoing care. Electronically Signed   By: Genevive Bi M.D.    On: 09/21/2023 16:55     ASSESSMENT & PLAN Tifanie Balducci is 63 y.o. female with medical history significant for metastatic neuroendocrine tumor of the colon who presents for a follow up visit.    # Metastatic Neuroendocrine Tumor, Metastasis to the Spine  --previously discussed with patient that the options would include observation with serial imaging or starting lanreotide therapy. The patient opted to start treatment --will plan for continued lanreotide 120mg  subq q28 days. Started therapy on 12/27/2019. This will be continued until progression or intolerance to therapy. Next dose due on 02/19/2021 --Due to the extensive involvement of her skeleton and increased activity on her last nuclear medicine scan it was recommended that she be considered for Lu 177 dotatate treatment. Initiated treatment on 04/15/2021 with plans to complete a total of 4 treatments.  --CU-64 scan q 3 months. Last in April 2022. Com[facility] PET CT scan at [facility] on 02/02/2021, no evidence of lymphoma.  --Patient expressed interest in a second opinion.  She saw Dr. Lattie Corns at [facility].  He provided excellent advice regarding options moving forward.  After hearing all the different options the patient noted she would like to continue her current lanreotide shots with consideration of increased  frequency if she were to be found to be progressing --patient underwent treatment with Lutathera therapy. She is s/p treatment 4 of 4. They administered 30 mg IM long acting sandostatin on days where she undergoes Lutathera treatment (q 8 weeks x 4 doses). Completed the 4th Lutathera treatment on 09/30/21  Plan:  --continue lanreotide 120 mg q 28 days with q 3 month zometa --Last Zometa infusion completed on Oct 2024.  --Labs today show white blood cell count 5.2, hemoglobin 9.8, MCV 82.5, platelets 427. Creatinine and LFTs normal. Chromogranin levels pending today. Last chromogranin level from 08/29/2023 was normal at  25.2 --Last PET dotatate scan from 07/31/2023 did show concern for progression with new lung nodule and some new areas of FDG avidity -- Discussed treatment options moving forward including repeat Lutathera versus everolimus.  The patient notes she would like to pursue Lutathera at this time.   --Dr. Ty Hilts recommended repeat Lutathera treatments x 2 doses. Received first dose on 09/21/2023 with sandostatin injection.   --She is scheduled for second opinion at [facility] in [place], [place] in May 2025.  --RTC in 12 weeks with interval continued monthly lanreotide injections.   #Anemia: --Likely secondary to cancer therapy.  --Will check iron/B12/folate levels at next visit.  --Patient stopped taking PO iron due to constipation. Consider IV Iron if there is evidence of iron deficiency.   #Back pain: --Pain was present before restarting Lutathera and controlled with oxycodone 5-10 mg q 4 hours.  --Consider repeat imaging if pain worsens before second Lutathera treatment.   #Mantle Cell Lymphoma -- Incidental finding from prior biopsy results.  This was detected on secondary pathological review at [facility] when the patient went for second opinion --Unclear if this represents a small low-grade clinically insignificant population of mantle cell lymphoma or a more concerning issue --Patient following with Dr. Nolen Mu at [facility] --PET CT scan on 02/02/2021 showed no evidence of lymphoma.  --We will continue to monitor and appreciate the guidance of Dr. Nolen Mu  #Possible Meningioma: --Seen on MRI brain  from 08/09/2022 at [facility], measuring 3.6 cm along the right frontal convexity with broad-based dural attachment.  --Evaluated by Dr. Sharilyn Sites from [facility] who recommended surgical resection.  --Patient seen for a second opinion at [facility] on 10/30/2022 --current plan for repeat scan in Oct/Nov 2024  No orders of the defined types were placed in  this encounter.  All questions were answered. The patient knows to call the clinic with any problems, questions or concerns.  I have spent a total of 30 minutes minutes of face-to-face and non-face-to-face time, preparing to see the patient,performing a medically appropriate examination, counseling and educating the patient, documenting clinical information in the electronic health record, and care coordination.   Georga Kaufmann PA-C Dept of Hematology and Oncology [facility] Phone: [phone]     09/29/2023 12:55 PM

## 2023-10-02 LAB — CHROMOGRANIN A: Chromogranin A (ng/mL): 23 ng/mL (ref 0.0–101.8)

## 2023-10-05 LAB — HM MAMMOGRAPHY

## 2023-10-06 ENCOUNTER — Encounter: Payer: Self-pay | Admitting: Internal Medicine

## 2023-10-09 ENCOUNTER — Other Ambulatory Visit: Payer: Self-pay

## 2023-10-09 MED ORDER — OXYCODONE-ACETAMINOPHEN 5-325 MG PO TABS
1.0000 | ORAL_TABLET | ORAL | 0 refills | Status: AC | PRN
Start: 2023-10-09 — End: ?

## 2023-10-11 ENCOUNTER — Encounter: Payer: Self-pay | Admitting: Nurse Practitioner

## 2023-10-11 ENCOUNTER — Ambulatory Visit: Payer: 59 | Admitting: Nurse Practitioner

## 2023-10-11 VITALS — BP 118/80 | HR 90 | Ht 62.75 in | Wt 160.0 lb

## 2023-10-11 DIAGNOSIS — Z1331 Encounter for screening for depression: Secondary | ICD-10-CM | POA: Diagnosis not present

## 2023-10-11 DIAGNOSIS — Z780 Asymptomatic menopausal state: Secondary | ICD-10-CM

## 2023-10-11 DIAGNOSIS — Z01419 Encounter for gynecological examination (general) (routine) without abnormal findings: Secondary | ICD-10-CM

## 2023-10-11 NOTE — Progress Notes (Signed)
 Michelle Solomon Jan 06, 1961 [id number]   History:  63 y.o. G3P3 presents for annual exam. Postmenopausal - no HRT, no bleeding. Normal pap history. Metastatic neuroendocrine tumor of colon. Receiving Lanreotide injections monthly, restarted radiation after mild progression found in January. Has appt at [facility] in May for second opinion. Does have some lower back pain and bilateral hip pain. H/O uterine fibroids.   Gynecologic History Patient's last menstrual period was 10/26/2017.   Contraception/Family planning: post menopausal status Sexually active: No  Health Maintenance Last Pap: 10/19/2020. Results were: Normal neg HPV Last mammogram: 10/05/2023. Results were: Normal Last colonoscopy: 08/18/2023. Results were: Tubular adenoma, 5-year recall Last Dexa: Not indicated  Flowsheet Row Office Visit from 10/11/2023 in [facility]  PHQ-2 Total Score 0        Past medical history, past surgical history, family history and social history were all reviewed and documented in the EPIC chart. Widowed. Works at [employer]. 2 sons, 1 daughter. 3 grandchildren ages 71m, 55 and 21.  ROS:  A ROS was performed and pertinent positives and negatives are included.  Exam:  Vitals:   10/11/23 1205  BP: 118/80  Pulse: 90  SpO2: 99%  Weight: 160 lb (72.6 kg)  Height: 5' 2.75" (1.594 m)     Body mass index is 28.57 kg/m.  General appearance:  Normal Thyroid:  Symmetrical, normal in size, without palpable masses or nodularity. Respiratory  Auscultation:  Clear without wheezing or rhonchi Cardiovascular  Auscultation:  Regular rate, without rubs, murmurs or gallops  Edema/varicosities:  Not grossly evident Abdominal  Soft,nontender, without masses, guarding or rebound.  Liver/spleen:  No organomegaly noted  Hernia:  None appreciated  Skin  Inspection:  Grossly normal Breasts: Examined lying and sitting.   Right: Without masses, retractions, nipple  discharge or axillary adenopathy.  Left: Without masses, retractions, nipple discharge or axillary adenopathy. Pelvic: External genitalia:  no lesions              Urethra:  normal appearing urethra with no masses, tenderness or lesions              Bartholins and Skenes: normal                 Vagina: normal appearing vagina with normal color and discharge, no lesions              Cervix: no lesions Bimanual Exam:  Uterus:  no masses or tenderness              Adnexa: no mass, fullness, tenderness              Rectovaginal: Deferred              Anus:  normal, no lesions  Patient informed chaperone available to be present for breast and pelvic exam. Patient has requested no chaperone to be present. Patient has been advised what will be completed during breast and pelvic exam.   Assessment/Plan:  63 y.o. G3P3 for annual exam.   Well female exam with routine gynecological exam - Education provided on SBEs, importance of preventative screenings, current guidelines, high calcium diet, regular exercise, and multivitamin daily. Labs with PCP.   Postmenopausal - no HRT, no bleeding.   Screening for cervical cancer - Normal Pap history. Will repeat at 5-year interval per guidelines.   Screening for breast cancer - History of benign cysts and calcifications.  Continue annual screenings.  Normal breast exam today.   Screening  for colon cancer - 08/2023 colonoscopy. Will repeat at GI's recommended interval.   Return in about 1 year (around 10/10/2024) for Annual.    Olivia Mackie DNP, 12:50 PM 10/11/2023

## 2023-10-12 ENCOUNTER — Telehealth: Payer: Self-pay

## 2023-10-12 NOTE — Telephone Encounter (Signed)
 Notified Patient of completion of FMLA and Disability Forms. Fax transmission confirmation received. Copy of forms emailed to Patient as requested. No other needs or concerns noted at this time.

## 2023-10-18 ENCOUNTER — Telehealth: Payer: Self-pay | Admitting: *Deleted

## 2023-10-18 NOTE — Telephone Encounter (Signed)
 Received call from pt. She is asking about the zometa she had been getting. Her last dose was 04/2023. Advised that zometa is only given two years, not longer as it has not shown in studies to be more effective the longer you get it. Advised that her 1st dose was in July 2022. So she has already reached the 2 year mark. Pt voiced understanding. Advised to continue taking Calcium and Vit D for bone health. We also reviewed her up coming schedule. It appears that her next Nuclear Med appt is about 2 weeks too early. She is scheduled to get Lanreotide here on 10/24/23 therefore her next Lutathera /lanreotide should be closer to 11/23/23 Michelle Solomon said she will call to get that appt changed. No other questions or concerns.

## 2023-10-24 ENCOUNTER — Inpatient Hospital Stay (HOSPITAL_BASED_OUTPATIENT_CLINIC_OR_DEPARTMENT_OTHER): Admitting: Nurse Practitioner

## 2023-10-24 ENCOUNTER — Inpatient Hospital Stay: Payer: 59 | Attending: Hematology and Oncology

## 2023-10-24 ENCOUNTER — Encounter: Payer: Self-pay | Admitting: Nurse Practitioner

## 2023-10-24 ENCOUNTER — Inpatient Hospital Stay: Payer: 59

## 2023-10-24 VITALS — BP 143/96 | HR 86 | Temp 98.5°F | Resp 16 | Ht 62.75 in | Wt 161.5 lb

## 2023-10-24 VITALS — BP 153/97 | HR 81 | Temp 98.1°F | Resp 18

## 2023-10-24 DIAGNOSIS — G893 Neoplasm related pain (acute) (chronic): Secondary | ICD-10-CM | POA: Diagnosis not present

## 2023-10-24 DIAGNOSIS — Z79899 Other long term (current) drug therapy: Secondary | ICD-10-CM | POA: Diagnosis not present

## 2023-10-24 DIAGNOSIS — C7A8 Other malignant neuroendocrine tumors: Secondary | ICD-10-CM

## 2023-10-24 DIAGNOSIS — K5900 Constipation, unspecified: Secondary | ICD-10-CM | POA: Insufficient documentation

## 2023-10-24 DIAGNOSIS — C7B8 Other secondary neuroendocrine tumors: Secondary | ICD-10-CM

## 2023-10-24 DIAGNOSIS — C7A025 Malignant carcinoid tumor of the sigmoid colon: Secondary | ICD-10-CM | POA: Diagnosis present

## 2023-10-24 DIAGNOSIS — R530 Neoplastic (malignant) related fatigue: Secondary | ICD-10-CM | POA: Diagnosis not present

## 2023-10-24 DIAGNOSIS — Z515 Encounter for palliative care: Secondary | ICD-10-CM

## 2023-10-24 DIAGNOSIS — C7B03 Secondary carcinoid tumors of bone: Secondary | ICD-10-CM | POA: Diagnosis present

## 2023-10-24 DIAGNOSIS — D649 Anemia, unspecified: Secondary | ICD-10-CM

## 2023-10-24 LAB — CBC WITH DIFFERENTIAL (CANCER CENTER ONLY)
Abs Immature Granulocytes: 0.03 10*3/uL (ref 0.00–0.07)
Basophils Absolute: 0 10*3/uL (ref 0.0–0.1)
Basophils Relative: 0 %
Eosinophils Absolute: 0.1 10*3/uL (ref 0.0–0.5)
Eosinophils Relative: 2 %
HCT: 30.6 % — ABNORMAL LOW (ref 36.0–46.0)
Hemoglobin: 10 g/dL — ABNORMAL LOW (ref 12.0–15.0)
Immature Granulocytes: 1 %
Lymphocytes Relative: 45 %
Lymphs Abs: 1.7 10*3/uL (ref 0.7–4.0)
MCH: 27 pg (ref 26.0–34.0)
MCHC: 32.7 g/dL (ref 30.0–36.0)
MCV: 82.7 fL (ref 80.0–100.0)
Monocytes Absolute: 0.5 10*3/uL (ref 0.1–1.0)
Monocytes Relative: 12 %
Neutro Abs: 1.5 10*3/uL — ABNORMAL LOW (ref 1.7–7.7)
Neutrophils Relative %: 40 %
Platelet Count: 204 10*3/uL (ref 150–400)
RBC: 3.7 MIL/uL — ABNORMAL LOW (ref 3.87–5.11)
RDW: 18.3 % — ABNORMAL HIGH (ref 11.5–15.5)
WBC Count: 3.8 10*3/uL — ABNORMAL LOW (ref 4.0–10.5)
nRBC: 0 % (ref 0.0–0.2)

## 2023-10-24 LAB — CMP (CANCER CENTER ONLY)
ALT: 17 U/L (ref 0–44)
AST: 19 U/L (ref 15–41)
Albumin: 3.8 g/dL (ref 3.5–5.0)
Alkaline Phosphatase: 214 U/L — ABNORMAL HIGH (ref 38–126)
Anion gap: 5 (ref 5–15)
BUN: 10 mg/dL (ref 8–23)
CO2: 30 mmol/L (ref 22–32)
Calcium: 9.3 mg/dL (ref 8.9–10.3)
Chloride: 106 mmol/L (ref 98–111)
Creatinine: 0.59 mg/dL (ref 0.44–1.00)
GFR, Estimated: >60 mL/min (ref >60)
Glucose, Bld: 118 mg/dL — ABNORMAL HIGH (ref 70–99)
Potassium: 4 mmol/L (ref 3.5–5.1)
Sodium: 141 mmol/L (ref 135–145)
Total Bilirubin: 0.4 mg/dL (ref 0.0–1.2)
Total Protein: 6.9 g/dL (ref 6.5–8.1)

## 2023-10-24 LAB — IRON AND IRON BINDING CAPACITY (CC-WL,HP ONLY)
Iron: 110 ug/dL (ref 28–170)
Saturation Ratios: 35 % — ABNORMAL HIGH (ref 10.4–31.8)
TIBC: 319 ug/dL (ref 250–450)
UIBC: 209 ug/dL (ref 148–442)

## 2023-10-24 LAB — FOLATE: Folate: 8.7 ng/mL (ref >5.9)

## 2023-10-24 LAB — FERRITIN: Ferritin: 421 ng/mL — ABNORMAL HIGH (ref 11–307)

## 2023-10-24 LAB — VITAMIN B12: Vitamin B-12: 352 pg/mL (ref 180–914)

## 2023-10-24 MED ORDER — LANREOTIDE ACETATE 120 MG/0.5ML ~~LOC~~ SOLN
120.0000 mg | Freq: Once | SUBCUTANEOUS | Status: AC
Start: 2023-10-24 — End: 2023-10-24
  Administered 2023-10-24: 120 mg via SUBCUTANEOUS
  Filled 2023-10-24: qty 120

## 2023-10-24 NOTE — Progress Notes (Signed)
 Palliative Medicine [facility]  [phone] [phone]) [phone]   Name: Michelle Solomon Date: 10/24/2023 MRN: [id number]  DOB: Jan 05, 1961  Patient Care Team: Roslyn Coombe, MD as PCP - General Pickenpack-Cousar, Giles Labrum, NP as Nurse Practitioner (Hospice and Palliative Medicine) Andee Bamberger, NP as Nurse Practitioner (Gynecology)    INTERVAL HISTORY: Michelle Solomon is a 63 y.o. 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 Palliative ask to see for symptom management and goals of care.      SOCIAL HISTORY:     reports that she has been smoking cigarettes. She started smoking about 44 years ago. She has a 20 pack-year smoking history. She has never used smokeless tobacco. She reports that she does not currently use alcohol. She reports that she does not use drugs.  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  MEDICATIONS:  Current Outpatient Medications  Medication Sig Dispense Refill   buPROPion  (WELLBUTRIN  XL) 300 MG 24 hr tablet TAKE 1 TABLET(300 MG) BY MOUTH DAILY 90 tablet 3   cholecalciferol  (VITAMIN D3) 25 MCG (1000 UNIT) tablet Take 1,000 Units by mouth daily.     ferrous sulfate  325 (65 FE) MG tablet Take 1 tablet (325 mg total) by mouth daily with breakfast. Please take with a source of Vitamin C 90 tablet 3   GARLIC PO Take 1 tablet by mouth daily.     GINKGO BILOBA EXTRACT PO Take 1 tablet by mouth daily.     hydrochlorothiazide  (HYDRODIURIL ) 12.5 MG tablet TAKE 1 TABLET(12.5 MG) BY MOUTH DAILY 30 tablet 0   lanreotide acetate  (SOMATULINE DEPOT ) 120 MG/0.5ML injection Inject 120 mg into the skin every 28 (twenty-eight) days. 0.5 mL 12   loratadine (CLARITIN) 10 MG tablet Take 10 mg by mouth daily as needed for allergies.     losartan  (COZAAR ) 50 MG tablet TAKE 1 TABLET(50 MG) BY MOUTH DAILY 90 tablet 3   ondansetron  (ZOFRAN ) 8 MG tablet Take 1 tablet (8 mg total) by mouth 2 (two) times daily as needed for nausea or vomiting. 20 tablet 0   ondansetron  (ZOFRAN ) 8 MG tablet Take 1 tablet (8 mg total) by mouth 2 (two) times daily as needed for nausea or vomiting. 20 tablet 0   oxyCODONE -acetaminophen  (PERCOCET/ROXICET) 5-325 MG tablet Take 1-2 tablets by mouth every 4 (four) hours as needed for severe pain (pain score 7-10) or moderate pain (pain score 4-6). 60 tablet 0   polyethylene glycol (MIRALAX ) 17 g packet Take 17 g by mouth 2 (two) times daily. 60 packet 0   Turmeric 500 MG CAPS Take 500  mg by mouth daily.     VITAMIN D -VITAMIN K PO Take 1 tablet by mouth daily.     zolpidem  (AMBIEN ) 10 MG tablet Take 1 tablet (10 mg total) by mouth at bedtime as needed for sleep. 90 tablet 1   No current facility-administered medications for this visit.   Facility-Administered Medications Ordered in Other Visits  Medication Dose Route Frequency Provider Last Rate Last Admin   octreotide  (SANDOSTATIN  LAR) 30 MG IM injection             VITAL SIGNS: BP (!) 143/96 (BP Location: Right Arm, Patient Position: Sitting)   Pulse 86   Temp 98.5 F (36.9 C) (Temporal)   Resp 16   Ht 5' 2.75"  (1.594 m)   Wt 161 lb 8 oz (73.3 kg)   LMP 10/26/2017   SpO2 100%   BMI 28.84 kg/m  Filed Weights   10/24/23 1047  Weight: 161 lb 8 oz (73.3 kg)    Estimated body mass index is 28.84 kg/m as calculated from the following:   Height as of this encounter: 5' 2.75" (1.594 m).   Weight as of this encounter: 161 lb 8 oz (73.3 kg).   PERFORMANCE STATUS (ECOG) : 1 - Symptomatic but completely ambulatory  Physical Exam General: NAD Cardiovascular: regular rate and rhythm Pulmonary: normal breathing pattern Extremities: no edema, no joint deformities Skin: no rashes Neurological: AAO x3  IMPRESSION: Discussed the use of AI scribe software for clinical note transcription with the patient, who gave verbal consent to proceed.  History of Present Illness Michelle Solomon is a 63 year old female who presents to clinic for symptom management follow-up. No acute distress. Denies concerns for nausea, vomiting, or diarrhea. Managing constipation with stool softeners. Appette is good. Patient shares recent visits from her sister who has encouraged her to remain active as much as possible.   She has been focusing on dietary changes, consuming one main meal a day, and supplementing with nutritional drinks like Ensure when skipping breakfast. No issues with constipation are reported. Her weight has remained stable around 161 pounds, and her energy levels are good. She has been able to engage in more physical activity, such as walking, which she attributes to the improvement in her pain levels. Her family history includes a supportive network, with two sisters who have been actively involved in her care. She is also preparing for her son's upcoming wedding, indicating a busy social life.  She experiences persistent back pain primarily located in the lower back, occasionally radiating to the side of her head. The pain was previously severe enough to render her bedridden, necessitating assistance from her  sisters. Over time, the pain has improved, and she has not required pain medication for about a week, although she previously relied on it heavily. Currently, the pain is manageable and does not significantly interfere with her daily activities.She is much appreciative of this improvement. We discussed use of pain medication as needed however given improvement occasional use of Tylenol  for mild to moderate discomfort.   All questions answered and support provided.   I discussed the importance of continued conversation with family and their medical providers regarding overall plan of care and treatment options, ensuring decisions are within the context of the patients values and GOCs. Assessment & Plan Cancer Related Pain management Back pain significantly improved, now mild without daily medication. No recent analgesic use, no constipation. - Continue current pain management without daily analgesics. - Encourage regular physical activity as  tolerated. - Maintain hydration and fiber intake.  Constipation Constipation likely due to pain medication. Recommended consistent use of Miralax . - Take Miralax  once daily in the morning with juice or coffee. - If no improvement, increase Miralax  to twice daily. - If Miralax  is ineffective, consider Senokot two tablets at bedtime.  Advance directives Provided guidance on completing advance directives and notarization process. - Provide advance directive forms. MOST forms.  - Schedule notarization appointment on April 25th.  Follow-up Follow-up appointment scheduled for May 22nd for lab work and injection. Advised to contact for pain medication refill if needed. - Attend follow-up appointment on May 22nd for lab and injection per oncology.   Patient expressed understanding and was in agreement with this plan. She also understands that She can call the clinic at any time with any questions, concerns, or complaints.   Any controlled substances utilized  were prescribed in the context of palliative care. PDMP has been reviewed.    Visit consisted of counseling and education dealing with the complex and emotionally intense issues of symptom management and palliative care in the setting of serious and potentially life-threatening illness.  Dellia Ferguson, AGPCNP-BC  Palliative Medicine Team/[facility]

## 2023-10-25 LAB — CHROMOGRANIN A: Chromogranin A (ng/mL): 24.7 ng/mL (ref 0.0–101.8)

## 2023-10-26 ENCOUNTER — Encounter: Payer: Self-pay | Admitting: Physician Assistant

## 2023-10-27 ENCOUNTER — Inpatient Hospital Stay: Attending: Hematology and Oncology | Admitting: Licensed Clinical Social Worker

## 2023-11-07 ENCOUNTER — Other Ambulatory Visit (HOSPITAL_COMMUNITY): Payer: 59

## 2023-11-08 NOTE — Written Directive (Cosign Needed)
 LUTATHERA  THERAPY   RADIOPHARMACEUTICAL:  Lutetium 177 Dotatate (Lutathera )     PRESCRIBED DOSE FOR ADMINISTRATION:  200 mCi   ROUTE OFADMINISTRATION:  IV   DIAGNOSIS:  Neuroendocrine tumor    REFERRING PHYSICIAN: Dr Amparo Balk    TREATMENT #: Retreatment #2    DATE OF LAST LONG LIVED SOMATOSTATIN INJECTION: Lanreotide 10/24/2023    ADDITIONAL PHYSICIAN COMMENTS/NOTES:   AUTHORIZED USER SIGNATURE & TIME STAMP: Reino Carbo, MD   11/09/23    9:55 AM

## 2023-11-09 ENCOUNTER — Encounter (HOSPITAL_COMMUNITY)
Admission: RE | Admit: 2023-11-09 | Discharge: 2023-11-09 | Disposition: A | Discharge status: Home / Self Care | Source: Ambulatory Visit | Attending: Hematology and Oncology | Admitting: Hematology and Oncology

## 2023-11-09 VITALS — BP 139/97 | HR 85

## 2023-11-09 DIAGNOSIS — C7A8 Other malignant neuroendocrine tumors: Secondary | ICD-10-CM | POA: Insufficient documentation

## 2023-11-09 DIAGNOSIS — C7B8 Other secondary neuroendocrine tumors: Secondary | ICD-10-CM | POA: Diagnosis present

## 2023-11-09 LAB — COMPREHENSIVE METABOLIC PANEL WITH GFR
ALT: 18 U/L (ref 0–44)
AST: 25 U/L (ref 15–41)
Albumin: 3.1 g/dL — ABNORMAL LOW (ref 3.5–5.0)
Alkaline Phosphatase: 138 U/L — ABNORMAL HIGH (ref 38–126)
Anion gap: 4 — ABNORMAL LOW (ref 5–15)
BUN: 11 mg/dL (ref 8–23)
CO2: 23 mmol/L (ref 22–32)
Calcium: 7.6 mg/dL — ABNORMAL LOW (ref 8.9–10.3)
Chloride: 110 mmol/L (ref 98–111)
Creatinine, Ser: 0.53 mg/dL (ref 0.44–1.00)
GFR, Estimated: >60 mL/min (ref >60)
Glucose, Bld: 135 mg/dL — ABNORMAL HIGH (ref 70–99)
Potassium: 4.9 mmol/L (ref 3.5–5.1)
Sodium: 137 mmol/L (ref 135–145)
Total Bilirubin: 0.8 mg/dL (ref 0.0–1.2)
Total Protein: 6.2 g/dL — ABNORMAL LOW (ref 6.5–8.1)

## 2023-11-09 LAB — CBC WITH DIFFERENTIAL/PLATELET
Abs Immature Granulocytes: 0.01 10*3/uL (ref 0.00–0.07)
Basophils Absolute: 0 10*3/uL (ref 0.0–0.1)
Basophils Relative: 0 %
Eosinophils Absolute: 0.1 10*3/uL (ref 0.0–0.5)
Eosinophils Relative: 2 %
HCT: 32 % — ABNORMAL LOW (ref 36.0–46.0)
Hemoglobin: 10.2 g/dL — ABNORMAL LOW (ref 12.0–15.0)
Immature Granulocytes: 0 %
Lymphocytes Relative: 37 %
Lymphs Abs: 1.9 10*3/uL (ref 0.7–4.0)
MCH: 27.8 pg (ref 26.0–34.0)
MCHC: 31.9 g/dL (ref 30.0–36.0)
MCV: 87.2 fL (ref 80.0–100.0)
Monocytes Absolute: 0.6 10*3/uL (ref 0.1–1.0)
Monocytes Relative: 11 %
Neutro Abs: 2.7 10*3/uL (ref 1.7–7.7)
Neutrophils Relative %: 50 %
Platelets: 274 10*3/uL (ref 150–400)
RBC: 3.67 MIL/uL — ABNORMAL LOW (ref 3.87–5.11)
RDW: 18.4 % — ABNORMAL HIGH (ref 11.5–15.5)
WBC: 5.3 10*3/uL (ref 4.0–10.5)
nRBC: 0 % (ref 0.0–0.2)

## 2023-11-09 MED ORDER — LUTETIUM LU 177 DOTATATE 370 MBQ/ML IV SOLN
200.0000 | Freq: Once | INTRAVENOUS | Status: AC
  Administered 2023-11-09: 202.56 via INTRAVENOUS

## 2023-11-09 MED ORDER — SODIUM CHLORIDE 0.9 % IV SOLN
500.0000 mL | Freq: Once | INTRAVENOUS | Status: AC
  Administered 2023-11-09: 500 mL via INTRAVENOUS

## 2023-11-09 MED ORDER — SODIUM CHLORIDE 0.9 % IV SOLN
8.0000 mg | Freq: Once | INTRAVENOUS | Status: AC
  Administered 2023-11-09: 8 mg via INTRAVENOUS
  Filled 2023-11-09: qty 4

## 2023-11-09 MED ORDER — AMINO ACID RADIOPROTECTANT - L-LYSINE 2.5%/L-ARGININE 2.5% IN NS
250.0000 mL/h | INTRAVENOUS | Status: AC
  Administered 2023-11-09: 250 mL/h via INTRAVENOUS
  Filled 2023-11-09: qty 1000

## 2023-11-09 MED ORDER — OCTREOTIDE ACETATE 500 MCG/ML IJ SOLN: 500.0000 ug | Freq: Once | INTRAMUSCULAR | Status: DC | PRN

## 2023-11-09 MED ORDER — PROCHLORPERAZINE EDISYLATE 10 MG/2ML IJ SOLN: 10.0000 mg | Freq: Four times a day (QID) | INTRAMUSCULAR | Status: DC | PRN

## 2023-11-09 NOTE — Progress Notes (Signed)
 CLINICAL DATA: [age] year-old [female] with metastatic neuroendocrine tumor. Well differentiated tumor with somatostatin receptor is identified within the [skeleton] by DOTATATE PET CT scan.  EXAM: NUCLEAR MEDICINE LUTATHERA  ADMINISTRATION  TECHNIQUE: Infusion: The nuclear medicine technologist and I personally verified the dose activity ([204] mCi) to be delivered as specified in the written directive (200 mCi), and verified the patient identification via 2 separate methods.  20 gauge IV were started in the antecubital veins. Anti-emetics were administered by nursing staff. Amino acid renal protection was initiated 30 minutes prior to Lu 177 DOTATATE (Lutathera ) infusion and continued continuously for 4 hours. Lutathera  infusion was administered over 30 minutes.      The total administered dose was [202.6] mCi Lu 177 DOTATATE.    The entire IV tubing, venocatheter, stopcock and syringes was removed in total, placed in a disposal bag and sent for assay of the residual activity, which will be reported at a later time in our EMR by the physics staff. Pressure was applied to the venipuncture sites, and a compression bandage placed. Radiation Safety personnel were present to perform the discharge survey, as detailed on their documentation.    Patient received 30 mg IM long-acting Sandostatin  injection 4 hours after Lutathera  effusion in the nuclear medicine department.   RADIOPHARMACEUTICALS:   [202.6] mCi Lu 177 DOTATATE   FINDINGS: Diagnosis: [Metastatic neuroendocrine tumor.]    Current Infusion: [2]    Planned Infusions: [2]    Patient reports [minimal] interval symptoms following therapy.     Patient reports better control of bone pain which was during initial treatment.  No adverse evidence otherwise.     The patient's most recent blood counts were reviewed and remains a good candidate to proceed with Lutathera . The patient was situated in an infusion suite and  administered Lutathera  as above. Patient will follow-up with referring oncologist for interval serum laboratories (CBC and CMP) in approximately 4 weeks.       Sandostatin  injection was deferred as patient received injection on 10/24/2023.     IMPRESSION: [Second and final  Lu 177 DOTATATE Re treatment for metastatic neuroendocrine tumor. The patient tolerated the infusion well.     Consider follow-up DOTATATE PET scan in 6-8 weeks to assess tumor response.

## 2023-11-09 NOTE — Progress Notes (Signed)
 Pt. Tolerated Lutathera  treatment #2 well. She was in significantly less bone pain today than her last treatment. Pt. Did not receive Sandostatin  after treatment due to getting a scheduled Sandostatin  dose every 28 days with her last being 2 weeks ago. Pt. Vitals were stable before and after treatment. Pt. Had no adverse reactions during the treatment today. It was a pleasure caring for Michelle Solomon.

## 2023-11-17 ENCOUNTER — Telehealth: Payer: Self-pay

## 2023-11-17 NOTE — Telephone Encounter (Signed)
 Notified Patient of completion of Return to Work form. Fax transmission confirmation received. Copy of form placed for pick-up as requested. No other needs or concerns noted at this time.

## 2023-11-21 ENCOUNTER — Other Ambulatory Visit (HOSPITAL_COMMUNITY)

## 2023-11-23 ENCOUNTER — Inpatient Hospital Stay (HOSPITAL_BASED_OUTPATIENT_CLINIC_OR_DEPARTMENT_OTHER): Payer: 59 | Admitting: Hematology and Oncology

## 2023-11-23 ENCOUNTER — Inpatient Hospital Stay (HOSPITAL_BASED_OUTPATIENT_CLINIC_OR_DEPARTMENT_OTHER): Admitting: Nurse Practitioner

## 2023-11-23 ENCOUNTER — Encounter: Payer: Self-pay | Admitting: Nurse Practitioner

## 2023-11-23 ENCOUNTER — Inpatient Hospital Stay: Payer: 59 | Attending: Hematology and Oncology

## 2023-11-23 ENCOUNTER — Other Ambulatory Visit: Payer: Self-pay | Admitting: Hematology and Oncology

## 2023-11-23 VITALS — BP 121/97 | HR 93 | Temp 97.9°F | Resp 16 | Wt 156.4 lb

## 2023-11-23 DIAGNOSIS — C7A025 Malignant carcinoid tumor of the sigmoid colon: Secondary | ICD-10-CM | POA: Diagnosis present

## 2023-11-23 DIAGNOSIS — C7B8 Other secondary neuroendocrine tumors: Secondary | ICD-10-CM

## 2023-11-23 DIAGNOSIS — C7B03 Secondary carcinoid tumors of bone: Secondary | ICD-10-CM | POA: Insufficient documentation

## 2023-11-23 DIAGNOSIS — Z79899 Other long term (current) drug therapy: Secondary | ICD-10-CM | POA: Insufficient documentation

## 2023-11-23 DIAGNOSIS — C7A8 Other malignant neuroendocrine tumors: Secondary | ICD-10-CM

## 2023-11-23 DIAGNOSIS — F17210 Nicotine dependence, cigarettes, uncomplicated: Secondary | ICD-10-CM | POA: Diagnosis not present

## 2023-11-23 DIAGNOSIS — D649 Anemia, unspecified: Secondary | ICD-10-CM | POA: Diagnosis not present

## 2023-11-23 DIAGNOSIS — Z515 Encounter for palliative care: Secondary | ICD-10-CM

## 2023-11-23 DIAGNOSIS — R530 Neoplastic (malignant) related fatigue: Secondary | ICD-10-CM

## 2023-11-23 LAB — CBC WITH DIFFERENTIAL (CANCER CENTER ONLY)
Abs Immature Granulocytes: 0 10*3/uL (ref 0.00–0.07)
Basophils Absolute: 0 10*3/uL (ref 0.0–0.1)
Basophils Relative: 0 %
Eosinophils Absolute: 0 10*3/uL (ref 0.0–0.5)
Eosinophils Relative: 2 %
HCT: 35.4 % — ABNORMAL LOW (ref 36.0–46.0)
Hemoglobin: 11.6 g/dL — ABNORMAL LOW (ref 12.0–15.0)
Immature Granulocytes: 0 %
Lymphocytes Relative: 34 %
Lymphs Abs: 0.8 10*3/uL (ref 0.7–4.0)
MCH: 27.8 pg (ref 26.0–34.0)
MCHC: 32.8 g/dL (ref 30.0–36.0)
MCV: 84.9 fL (ref 80.0–100.0)
Monocytes Absolute: 0.4 10*3/uL (ref 0.1–1.0)
Monocytes Relative: 18 %
Neutro Abs: 1.1 10*3/uL — ABNORMAL LOW (ref 1.7–7.7)
Neutrophils Relative %: 46 %
Platelet Count: 242 10*3/uL (ref 150–400)
RBC: 4.17 MIL/uL (ref 3.87–5.11)
RDW: 16.7 % — ABNORMAL HIGH (ref 11.5–15.5)
WBC Count: 2.4 10*3/uL — ABNORMAL LOW (ref 4.0–10.5)
nRBC: 0 % (ref 0.0–0.2)

## 2023-11-23 LAB — CMP (CANCER CENTER ONLY)
ALT: 11 U/L (ref 0–44)
AST: 16 U/L (ref 15–41)
Albumin: 4.4 g/dL (ref 3.5–5.0)
Alkaline Phosphatase: 159 U/L — ABNORMAL HIGH (ref 38–126)
Anion gap: 6 (ref 5–15)
BUN: 12 mg/dL (ref 8–23)
CO2: 30 mmol/L (ref 22–32)
Calcium: 9.3 mg/dL (ref 8.9–10.3)
Chloride: 102 mmol/L (ref 98–111)
Creatinine: 0.66 mg/dL (ref 0.44–1.00)
GFR, Estimated: >60 mL/min (ref >60)
Glucose, Bld: 114 mg/dL — ABNORMAL HIGH (ref 70–99)
Potassium: 3.9 mmol/L (ref 3.5–5.1)
Sodium: 138 mmol/L (ref 135–145)
Total Bilirubin: 0.7 mg/dL (ref 0.0–1.2)
Total Protein: 7.7 g/dL (ref 6.5–8.1)

## 2023-11-23 NOTE — Progress Notes (Signed)
 Palliative Medicine [facility]  Telephone:(336) [phone] Fax:(336) [phone]   Name: Michelle Solomon Date: 11/23/2023 MRN: [id number]  DOB: [birth year]  Patient Care Team: Roslyn Coombe, MD as PCP - General Pickenpack-Cousar, Giles Labrum, NP as Nurse Practitioner (Hospice and Palliative Medicine) Andee Bamberger, NP as Nurse Practitioner (Gynecology)    INTERVAL HISTORY: Michelle Solomon is a 63 y.o. 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 Palliative ask to see for symptom management and goals of care.      SOCIAL HISTORY:     reports that she has been smoking cigarettes. She started smoking about 44 years ago. She has a 20 pack-year smoking history. She has never used smokeless tobacco. She reports that she does not currently use alcohol. She reports that she does not use drugs.  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  MEDICATIONS:  Current Outpatient Medications  Medication Sig Dispense Refill   buPROPion  (WELLBUTRIN  XL) 300 MG 24 hr tablet TAKE 1 TABLET(300 MG) BY MOUTH DAILY 90 tablet 3   cholecalciferol  (VITAMIN D3) 25 MCG (1000 UNIT) tablet Take 1,000 Units by mouth daily.     ferrous sulfate  325 (65 FE) MG tablet Take 1 tablet (325 mg total) by mouth daily with breakfast. Please take with a source of Vitamin C 90 tablet 3   GARLIC PO Take 1 tablet by mouth daily.     GINKGO BILOBA EXTRACT PO Take 1 tablet by mouth daily.     hydrochlorothiazide  (HYDRODIURIL ) 12.5 MG tablet TAKE 1 TABLET(12.5 MG) BY MOUTH DAILY 30 tablet 0   lanreotide acetate  (SOMATULINE DEPOT ) 120 MG/0.5ML injection Inject 120 mg into the skin every 28 (twenty-eight) days. 0.5 mL 12   loratadine (CLARITIN) 10 MG tablet Take 10 mg by mouth daily as needed for allergies.     losartan  (COZAAR ) 50 MG tablet TAKE 1 TABLET(50 MG) BY MOUTH DAILY 90 tablet 3   ondansetron  (ZOFRAN ) 8 MG tablet Take 1 tablet (8 mg total) by mouth 2 (two) times daily as needed for nausea or vomiting. 20 tablet 0   ondansetron  (ZOFRAN ) 8 MG tablet Take 1 tablet (8 mg total) by mouth 2 (two) times daily as needed for nausea or vomiting. 20 tablet 0   oxyCODONE -acetaminophen  (PERCOCET/ROXICET) 5-325 MG tablet Take 1-2 tablets by mouth every 4 (four) hours as needed for severe pain (pain score 7-10) or moderate pain (pain score 4-6). 60 tablet 0   polyethylene glycol (MIRALAX ) 17 g packet Take 17 g by mouth 2 (two) times daily. 60 packet 0   Turmeric 500 MG CAPS Take 500  mg by mouth daily.     VITAMIN D -VITAMIN K PO Take 1 tablet by mouth daily.     zolpidem  (AMBIEN ) 10 MG tablet Take 1 tablet (10 mg total) by mouth at bedtime as needed for sleep. 90 tablet 1   No current facility-administered medications for this visit.   Facility-Administered Medications Ordered in Other Visits  Medication Dose Route Frequency Provider Last Rate Last Admin   octreotide  (SANDOSTATIN  LAR) 30 MG IM injection             VITAL SIGNS: LMP 10/26/2017  There were no vitals filed for this visit.   Estimated body mass index is 27.93 kg/m as calculated from the following:    Height as of 10/24/23: 5' 2.75" (1.594 m).   Weight as of an earlier encounter on 11/23/23: 156 lb 6.4 oz (70.9 kg).   PERFORMANCE STATUS (ECOG) : 1 - Symptomatic but completely ambulatory  Physical Exam General: NAD Cardiovascular: regular rate and rhythm Pulmonary: normal breathing pattern Extremities: no edema, no joint deformities Skin: no rashes Neurological: AAO x3  IMPRESSION: Discussed the use of AI scribe software for clinical note transcription with the patient, who gave verbal consent to proceed.  History of Present Illness Michelle Solomon is a 63 year old female who presents to clinic for symptom management follow-up. No acute distress. Her appetite is good, and she is sleeping well. No nausea, vomiting, constipation, or diarrhea. She describes her energy level as good and has returned to work this week after being on leave. She received a new computer from her workplace to facilitate her return.  Patient shares apologies as she recently missed her appointment to complete advanced directives due to her son getting married. She expresses desire to reschedule and get documents completed. Information provided on how to reschedule.    She has experienced significant improvement in her back pain since her last radiation treatment on May 8th. Previously, she had severe bone pain that was debilitating, leaving her unable to move out of bed. She has not needed to use oxycodone  recently, although it remains available for breakthrough pain.  No new symptom needs at this time. Patient aware we are available as needed.   I discussed the importance of continued conversation with family and their medical providers regarding overall plan of care and treatment options, ensuring decisions are within the context of the patients values and GOCs. Assessment & Plan Cancer Related Pain management Back pain significantly improved, now mild without daily medication. No recent analgesic use, no  constipation. - Continue current pain management without daily analgesics. - Encourage regular physical activity as tolerated. - Maintain hydration and fiber intake.  Advance directives Provided guidance on completing advance directives and notarization process. She plans to finalize advanced directives and maintains a positive outlook on treatment. - Provide advance directive forms. MOST forms.  - Missed notarization appointment on April 25th. - Reschedule appointment to discuss and finalize advanced directives  Cancer Significant pain improvement post-radiation. No current symptoms affecting quality of life. PET scan scheduled to assess cancer status. - Schedule PET scan in approximately three months to assess cancer status. - Follow up with [facility] for ongoing treatment and care.  Follow-up Follow-up in 6-12 weeks. Sooner if needed.   Patient expressed understanding and was in agreement with this plan. She also understands that She can call the clinic at any time with any questions, concerns, or complaints.   Any controlled substances utilized were prescribed in the context of palliative care.  PDMP has been reviewed.   Visit consisted of counseling and education dealing with the complex and emotionally intense issues of symptom management and palliative care in the setting of serious and potentially life-threatening illness.  Dellia Ferguson, AGPCNP-BC  Palliative Medicine Team/[facility]

## 2023-11-23 NOTE — Progress Notes (Signed)
 [facility] [phone]   [phone]  PROGRESS NOTE  Patient Care Team: Roslyn Coombe, MD as PCP - General Pickenpack-Cousar, Giles Labrum, NP as Nurse Practitioner (Hospice and Palliative Medicine) Andee Bamberger, NP as Nurse Practitioner (Gynecology)  Hematological/Oncological History # Metastatic Neuroendocrine Tumor, Metastasis to the Spine 1) 08/26/2019: CT Abdomen Pelvis performed due to RUQ abdominal pain. Imaging showed a soft tissue mass abutting the right posterior eighth rib 2) 09/06/2019: CT chest performed which showed Ill-defined soft tissue nodule along the medial right posterior thoracic wall at the 8th intercostal space to the right of the spine. MRI was recommended. 3) 10/19/2019: MRI Thoracic spine showed enhancing lesions throughout the visualized spine consistent with metastatic disease. Additionally there was a 1.5 cm soft tissue nodule in the posteromedial right eighth intercostal space, more concerning for a metastasis 4) 10/25/2019: establish care with Dr. Rosaline Coma   5) 11/05/2019: attempted biopsy of soft tissue mass at right posterior 8th rib, but lesion had dissipated at time of biopsy attempt 6) 11/27/2019: PET CT scan performed showing right inguinal lymph nodes and osseous lesions, indicative of metastatic disease of unknown primary. 7) 12/11/2019: CT bone marrow biopsy showed metastatic neuroendocrine neoplasm 8) 12/27/2019: start of lanreotide 120mg  sub q28 days  9) 10/15/2020: NM PET (CU-64) scan showed widespread osseous and lymphatic uptake consistent with metastatic spread of neuroendocrine tumor.  This is more avid than prior. 01/20/2021: clinic visit with Dr. Harriet Limber due to concern for possible mantle cell lymphoma seen on pathology review of previous biopsy 04/15/2021: Dose 1 (of 4) of lutathera  treatment with nuclear medicine.  06/10/2021: Dose 2 (of 4) of lutathera  treatment with nuclear medicine.  08/05/2021: Dose 3 (of 4) of  lutathera  treatment with nuclear medicine.  09/03/2021:Dose 4 (of 4) of lutathera  treatment with nuclear medicine.  11/26/2021: NM Copper  Dotatate Scan shows dramatic reduction in number and activity of skeletal metastasis.Patient continues on monthly lanreotide injections 07/31/2023: PET Dotatate: Overall mild progression of multifocal skeletal metastasis involving the axillary and and appendicular skeleton. These intense radiotracer avid consistent well differentiated tumor. New lesion in the RIGHT hepatic lobe consistent with progressive hepatic metastasis. Interval increase in size of radiotracer avid nodule in the medial RIGHT upper chest 08/16/2023: MRI lumbar and thoracic spine: Widespread osseous metastatic disease throughout the regional osseous structures. No evidence of acute pathologic fracture. No extraosseous tumor extension. 09/21/2023: Dose 1 (of 2) of lutathera  treatment with nuclear medicine.   #Low Grade Neuroendocrine Tumor 1) 2006: reportedly had rectal carcinoid tumor removed 2) 08/14/2008: patient had a flexibile sigmoidoscopy with endoscopic ultrasound which resected an 8mm, subepithelial lesion in rectum. Findings consistent with low grade neuroendocrine tumor. 3) 04/08/2010: repeat colonoscopy, no residual tumor. Repeat recommended in 2016.  4) 12/11/2019: metastatic recurrence found on biopsy, noted above. 5) 11/26/2021:  NM PET CU-64 showed a dramatic reduction in the number and activity as skeletal metastases.  #Mantle Cell Lymphoma 11/24/2020: incidental findings of low level mantle cell lymphoma on bone marrow biopsy. Noted on outside review of pathology at [facility].  01/20/2021: clinic visit with Dr. Harriet Limber due to concern for possible mantle cell lymphoma seen on pathology review of previous biopsy  # Meningioma  08/09/2022: MRI Brain ordered due to PET scan showing concern for meningioma. Scan showed dural-based mass at the right posterior frontal vertex,  performed at [facility]  10/24/2022: 2nd opinion at [facility] 11/04/2022: MRI Brain showed meningioma measuring 3.5 x 3.5 x 2.1 cm.   Interval History:  Michelle Solomon 63 y.o. female with medical history significant for metastatic neuroendocrine tumor of the colon who presents for a follow up visit. The patient's last visit was on 09/29/2023. In the interim since the last visit she completed lutathera  treatment for recent progression of disease.  On exam today Ms. Butrum reports she tolerated her Lutathera  well with her last dose being on 11/09/2023.  She reports in the interim since our last discussion she also was seen at [facility] for second opinion.  There they recommended consideration of cabozantinib versus everolimus versus clinical trial in the event of progression.  I noted that we are able to offer the 2 conventional treatments that she mentioned, however clinical trial is not something we have available here.  We also discussed starting back for lanreotide for which she was agreeable.  The patient notes she would like to receive every 2-week blood work which is something we are happy to arrange.  She notes that otherwise she has been feeling well since our last discussion.  She has had no issues with nausea, vomiting, or diarrhea.  She denies any fevers, chills, sweats.  A full 10 point ROS has been otherwise negative.  The bulk of our discussion focused on treatment options moving forward.  She was agreeable to continuing lanreotide shots for maintenance until progression at which time we will discus next line of therapy.  MEDICAL HISTORY:  Past Medical History:  Diagnosis Date   Acute bronchitis 06/06/2010   ALLERGIC RHINITIS 05/29/2007   Allergy    seasonal   Anal or rectal pain 06/23/2008   ANEMIA-IRON  DEFICIENCY 05/29/2007   Anxiety    ASTHMA 05/29/2007   Asthma    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   Cancer (HCC)    Neuro endocrine tumor   CONJUNCTIVITIS, ALLERGIC 11/27/2008    HEMORRHOIDS 06/16/2008   HTN (hypertension)    Hx of adenomatous colonic polyps 11/2019   NIPPLE DISCHARGE 01/21/2010   Other specified forms of hearing loss 10/08/2009   Overweight(278.02) 05/29/2007   RASH-NONVESICULAR 05/29/2007   Wheezing 06/22/2010    SURGICAL HISTORY: Past Surgical History:  Procedure Laterality Date   COLONOSCOPY     COLONOSCOPY WITH PROPOFOL  N/A 08/18/2023   Procedure: COLONOSCOPY WITH PROPOFOL ;  Surgeon: Ace Holder, MD;  Location: [facility];  Service: Gastroenterology;  Laterality: N/A;   POLYPECTOMY  08/18/2023   Procedure: POLYPECTOMY;  Surgeon: Ace Holder, MD;  Location: [facility];  Service: Gastroenterology;;    SOCIAL HISTORY: Social History   Socioeconomic History   Marital status: Widowed    Spouse name: Not on file   Number of children: 3   Years of education: Not on file   Highest education level: Not on file  Occupational History   Occupation: CARD REPLACEMEN    Employer: [employer]   Occupation: customer service  Tobacco Use   Smoking status: Some Days    Current packs/day: 0.00    Average packs/day: 0.5 packs/day for 40.0 years (20.0 ttl pk-yrs)    Types: Cigarettes    Start date: 10/22/1979    Last attempt to quit: 10/22/2019    Years since quitting: 4.0   Smokeless tobacco: Never   Tobacco comments:    1 pack weekly. Started smoking at 64 years old  Vaping Use   Vaping status: Never Used  Substance and Sexual Activity   Alcohol use: Not Currently   Drug use: No   Sexual activity: Not Currently    Birth  control/protection: Post-menopausal  Other Topics Concern   Not on file  Social History Narrative   Daily caffeine use 3   Patient does not get regular exercise   Social Drivers of Health   Financial Resource Strain: Not on file  Food Insecurity: No Food Insecurity (08/17/2023)   Hunger Vital Sign    Worried About Running Out of Food in the Last Year: Never true    Ran Out of Food in the Last  Year: Never true  Transportation Needs: No Transportation Needs (08/17/2023)   PRAPARE - Administrator, Civil Service (Medical): No    Lack of Transportation (Non-Medical): No  Physical Activity: Not on file  Stress: Not on file  Social Connections: Not on file  Intimate Partner Violence: Not At Risk (08/17/2023)   Humiliation, Afraid, Rape, and Kick questionnaire    Fear of Current or Ex-Partner: No    Emotionally Abused: No    Physically Abused: No    Sexually Abused: No    FAMILY HISTORY: Family History  Problem Relation Age of Onset   Allergies Sister    Diabetes Sister    Hypertension Sister    Hypertension Mother    Prostate cancer Father    Hypertension Brother    Hypertension Sister    Diabetes Paternal Aunt    Cancer Paternal Aunt        type unknown   Prostate cancer Brother    Stroke Maternal Aunt    Hypertension Maternal Aunt    Colon cancer Neg Hx    Esophageal cancer Neg Hx    Rectal cancer Neg Hx    Stomach cancer Neg Hx     ALLERGIES:  is allergic to shellfish allergy.  MEDICATIONS:  Current Outpatient Medications  Medication Sig Dispense Refill   buPROPion  (WELLBUTRIN  XL) 300 MG 24 hr tablet TAKE 1 TABLET(300 MG) BY MOUTH DAILY 90 tablet 3   cholecalciferol (VITAMIN D3) 25 MCG (1000 UNIT) tablet Take 1,000 Units by mouth daily.     ferrous sulfate  325 (65 FE) MG tablet Take 1 tablet (325 mg total) by mouth daily with breakfast. Please take with a source of Vitamin C 90 tablet 3   GARLIC PO Take 1 tablet by mouth daily.     GINKGO BILOBA EXTRACT PO Take 1 tablet by mouth daily.     hydrochlorothiazide  (HYDRODIURIL ) 12.5 MG tablet TAKE 1 TABLET(12.5 MG) BY MOUTH DAILY 30 tablet 0   lanreotide acetate  (SOMATULINE DEPOT ) 120 MG/0.5ML injection Inject 120 mg into the skin every 28 (twenty-eight) days. 0.5 mL 12   loratadine (CLARITIN) 10 MG tablet Take 10 mg by mouth daily as needed for allergies.     losartan  (COZAAR ) 50 MG tablet TAKE 1  TABLET(50 MG) BY MOUTH DAILY 90 tablet 3   ondansetron  (ZOFRAN ) 8 MG tablet Take 1 tablet (8 mg total) by mouth 2 (two) times daily as needed for nausea or vomiting. 20 tablet 0   ondansetron  (ZOFRAN ) 8 MG tablet Take 1 tablet (8 mg total) by mouth 2 (two) times daily as needed for nausea or vomiting. 20 tablet 0   oxyCODONE -acetaminophen  (PERCOCET/ROXICET) 5-325 MG tablet Take 1-2 tablets by mouth every 4 (four) hours as needed for severe pain (pain score 7-10) or moderate pain (pain score 4-6). 60 tablet 0   polyethylene glycol (MIRALAX ) 17 g packet Take 17 g by mouth 2 (two) times daily. 60 packet 0   Turmeric 500 MG CAPS Take 500 mg by mouth daily.  VITAMIN D -VITAMIN K PO Take 1 tablet by mouth daily.     zolpidem  (AMBIEN ) 10 MG tablet Take 1 tablet (10 mg total) by mouth at bedtime as needed for sleep. 90 tablet 1   No current facility-administered medications for this visit.   Facility-Administered Medications Ordered in Other Visits  Medication Dose Route Frequency Provider Last Rate Last Admin   octreotide  (SANDOSTATIN  LAR) 30 MG IM injection             REVIEW OF SYSTEMS:   Constitutional: ( - ) fevers, ( - )  chills , ( - ) night sweats Eyes: ( - ) blurriness of vision, ( - ) double vision, ( - ) watery eyes Ears, nose, mouth, throat, and face: ( - ) mucositis, ( - ) sore throat Respiratory: ( - ) cough, ( - ) dyspnea, ( - ) wheezes Cardiovascular: ( - ) palpitation, ( - ) chest discomfort, ( - ) lower extremity swelling Gastrointestinal:  ( - ) nausea, ( - ) heartburn, ( - ) change in bowel habits Skin: ( - ) abnormal skin rashes Lymphatics: ( - ) new lymphadenopathy, ( - ) easy bruising Neurological: ( - ) numbness, ( - ) tingling, ( - ) new weaknesses Behavioral/Psych: ( - ) mood change, ( - ) new changes  All other systems were reviewed with the patient and are negative.  PHYSICAL EXAMINATION: ECOG PERFORMANCE STATUS: 1 - Symptomatic but completely ambulatory  Vitals:    11/23/23 1054  BP: (!) 121/97  Pulse: 93  Resp: 16  Temp: 97.9 F (36.6 C)  SpO2: 98%    Filed Weights   11/23/23 1054  Weight: 156 lb 6.4 oz (70.9 kg)     GENERAL: well appearing middle aged [place] American female in NAD  SKIN: skin color, texture, turgor are normal, no rashes or significant lesions EYES: conjunctiva are pink and non-injected, sclera clear LUNGS: clear to auscultation and percussion with normal breathing effort HEART: regular rate & rhythm and no murmurs and no lower extremity edema Musculoskeletal: no cyanosis of digits and no clubbing  PSYCH: alert & oriented x 3, fluent speech NEURO: no focal motor/sensory deficits  LABORATORY DATA:  I have reviewed the data as listed    Latest Ref Rng & Units 11/23/2023   10:19 AM 11/09/2023   10:49 AM 10/24/2023    9:24 AM  CBC  WBC 4.0 - 10.5 K/uL 2.4  5.3  3.8   Hemoglobin 12.0 - 15.0 g/dL 62.1  30.8  65.7   Hematocrit 36.0 - 46.0 % 35.4  32.0  30.6   Platelets 150 - 400 K/uL 242  274  204        Latest Ref Rng & Units 11/23/2023   10:19 AM 11/09/2023   11:30 AM 10/24/2023    9:24 AM  CMP  Glucose 70 - 99 mg/dL 846  962  952   BUN 8 - 23 mg/dL 12  11  10    Creatinine 0.44 - 1.00 mg/dL 8.41  3.24  4.01   Sodium 135 - 145 mmol/L 138  137  141   Potassium 3.5 - 5.1 mmol/L 3.9  4.9  4.0   Chloride 98 - 111 mmol/L 102  110  106   CO2 22 - 32 mmol/L 30  23  30    Calcium 8.9 - 10.3 mg/dL 9.3  7.6  9.3   Total Protein 6.5 - 8.1 g/dL 7.7  6.2  6.9   Total Bilirubin 0.0 -  1.2 mg/dL 0.7  0.8  0.4   Alkaline Phos 38 - 126 U/L 159  138  214   AST 15 - 41 U/L 16  25  19    ALT 0 - 44 U/L 11  18  17      RADIOGRAPHIC STUDIES: I have personally reviewed the radiological images as listed and agreed with the findings in the report.  NM LUTATHERA  ADMINISTRATION Result Date: 11/09/2023 CLINICAL DATA:  63 year-old female with metastatic neuroendocrine tumor. Well differentiated tumor with somatostatin receptor is  identified within the skeleton by DOTATATE PET CT scan. EXAM: NUCLEAR MEDICINE LUTATHERA  ADMINISTRATION TECHNIQUE: Infusion: The nuclear medicine technologist and I personally verified the dose activity (204 mCi) to be delivered as specified in the written directive (200 mCi), and verified the patient identification via 2 separate methods. 20 gauge IV were started in the antecubital veins. Anti-emetics were administered by nursing staff. Amino acid renal protection was initiated 30 minutes prior to Lu 177 DOTATATE (Lutathera ) infusion and continued continuously for 4 hours. Lutathera  infusion was administered over 30 minutes. The total administered dose was 202.6 mCi Lu 177 DOTATATE. The entire IV tubing, venocatheter, stopcock and syringes was removed in total, placed in a disposal bag and sent for assay of the residual activity, which will be reported at a later time in our EMR by the physics staff. Pressure was applied to the venipuncture sites, and a compression bandage placed. Radiation Safety personnel were present to perform the discharge survey, as detailed on their documentation. Patient received 30 mg IM long-acting Sandostatin  injection 4 hours after Lutathera  effusion in the nuclear medicine department. RADIOPHARMACEUTICALS:  202.6 mCi Lu 177 DOTATATE FINDINGS: Diagnosis: Metastatic neuroendocrine tumor. Current Infusion: 2 Planned Infusions: 2 Patient reports minimal interval symptoms following therapy. Patient reports better control of bone pain which was during initial treatment. No adverse evidence otherwise. The patient's most recent blood counts were reviewed and remains a good candidate to proceed with Lutathera . The patient was situated in an infusion suite and administered Lutathera  as above. Patient will follow-up with referring oncologist for interval serum laboratories (CBC and CMP) in approximately 4 weeks. Sandostatin  injection was deferred as patient received injection on 10/24/2023.  IMPRESSION: Second and final Lu 177 DOTATATE Re treatment for metastatic neuroendocrine tumor. The patient tolerated the infusion well. Consider follow-up DOTATATE PET scan in 6-8 weeks to assess tumor response. Electronically Signed   By: Deboraha Fallow M.D.   On: 11/09/2023 16:04     ASSESSMENT & PLAN Michelle Solomon is 64 y.o. female with medical history significant for metastatic neuroendocrine tumor of the colon who presents for a follow up visit.    # Metastatic Neuroendocrine Tumor, Metastasis to the Spine  --previously discussed with patient that the options would include observation with serial imaging or starting lanreotide therapy. The patient opted to start treatment --will plan for continued lanreotide 120mg  subq q28 days. Started therapy on 12/27/2019. This will be continued until progression or intolerance to therapy. Next dose due on 02/19/2021 --Due to the extensive involvement of her skeleton and increased activity on her last nuclear medicine scan it was recommended that she be considered for Lu 177 dotatate treatment. Initiated treatment on 04/15/2021 with plans to complete a total of 4 treatments.  --CU-64 scan q 3 months. Last in April 2022. Completed PET CT scan at [facility] on 02/02/2021, no evidence of lymphoma.  --Patient expressed interest in a second opinion.  She saw Dr. Romie Coast at [facility].  He provided excellent advice regarding options moving forward.  After hearing all the different options the patient noted she would like to continue her current lanreotide shots with consideration of increased frequency if she were to be found to be progressing --patient underwent treatment with Lutathera  therapy. She is s/p treatment 4 of 4. They administered 30 mg IM long acting sandostatin  on days where she undergoes Lutathera  treatment (q 8 weeks x 4 doses). Completed the 4th Lutathera  treatment on 09/30/21  Plan:  --continue lanreotide 120 mg q 28 days with q 3  month zometa  --Last Zometa  infusion completed on Oct 2024.  --Labs today show white blood cell count 2.4, hemoglobin 1.6, MCV 84.9, platelets 242. Creatinine and LFTs normal. Chromogranin levels pending today. Last chromogranin level from 08/29/2023 was normal at 25.2 --Last PET dotatate scan from 07/31/2023 did show concern for progression with new lung nodule and some new areas of FDG avidity -- Discussed treatment options moving forward including cabozantinib versus everolimus.  The patient notes clinical trials could also be an option through [facility]. --Dr. Marla Sills recommended repeat Lutathera  treatments x 2 doses. Received first dose on 09/21/2023 with sandostatin  injection.  Patient received her last dose on 11/08/2023. --She received a second opinion at [facility] in [place], [place] in May 2025.  Of note she has also been seen by Dr. Romie Coast at [facility] --RTC in 12 weeks with interval continued monthly lanreotide injections.  Per patient request will draw labs every 2 weeks.  #Anemia: --Likely secondary to cancer therapy.  --Will check iron /B12/folate levels at next visit.  --Patient stopped taking PO iron  due to constipation. Consider IV Iron  if there is evidence of iron  deficiency.   #Back pain: --Pain was present before restarting Lutathera  and controlled with oxycodone  5-10 mg q 4 hours.  --Consider repeat imaging if pain worsens before second Lutathera  treatment.   #Mantle Cell Lymphoma -- Incidental finding from prior biopsy results.  This was detected on secondary pathological review at [facility] when the patient went for second opinion --Unclear if this represents a small low-grade clinically insignificant population of mantle cell lymphoma or a more concerning issue --Patient following with Dr. Harriet Limber at [facility] --PET CT scan on 02/02/2021 showed no evidence of lymphoma.  --We will continue to monitor and appreciate the  guidance of Dr. Harriet Limber  #Possible Meningioma: --Seen on MRI brain  from 08/09/2022 at [facility], measuring 3.6 cm along the right frontal convexity with broad-based dural attachment.  --Evaluated by Dr. Roxy Cordial from [facility] who recommended surgical resection.  --Patient seen for a second opinion at [facility] on 10/30/2022 -- Continue to monitor  No orders of the defined types were placed in this encounter.  All questions were answered. The patient knows to call the clinic with any problems, questions or concerns.  I have spent a total of 30 minutes minutes of face-to-face and non-face-to-face time, preparing to see the patient,performing a medically appropriate examination, counseling and educating the patient, documenting clinical information in the electronic health record, and care coordination.   Rogerio Clay, MD Department of Hematology/Oncology [facility] at [facility] Phone: [phone] Pager: [phone] Email: [email or web address]    11/25/2023 7:30 PM

## 2023-11-24 ENCOUNTER — Telehealth: Payer: Self-pay | Admitting: Hematology and Oncology

## 2023-11-25 ENCOUNTER — Encounter: Payer: Self-pay | Admitting: Hematology and Oncology

## 2023-11-25 LAB — CHROMOGRANIN A: Chromogranin A (ng/mL): 25.4 ng/mL (ref 0.0–101.8)

## 2023-12-01 ENCOUNTER — Inpatient Hospital Stay

## 2023-12-01 VITALS — BP 127/79 | HR 84 | Temp 98.2°F | Resp 16 | Wt 159.0 lb

## 2023-12-01 DIAGNOSIS — C7B8 Other secondary neuroendocrine tumors: Secondary | ICD-10-CM

## 2023-12-01 DIAGNOSIS — C7A025 Malignant carcinoid tumor of the sigmoid colon: Secondary | ICD-10-CM | POA: Diagnosis not present

## 2023-12-01 MED ORDER — ZOLEDRONIC ACID 4 MG/100ML IV SOLN
4.0000 mg | INTRAVENOUS | Status: DC
  Administered 2023-12-01: 4 mg via INTRAVENOUS
  Filled 2023-12-01: qty 100

## 2023-12-01 MED ORDER — LANREOTIDE ACETATE 120 MG/0.5ML ~~LOC~~ SOLN
120.0000 mg | Freq: Once | SUBCUTANEOUS | Status: AC
  Administered 2023-12-01: 120 mg via SUBCUTANEOUS
  Filled 2023-12-01: qty 120

## 2023-12-01 MED ORDER — SODIUM CHLORIDE 0.9 % IV SOLN: INTRAVENOUS | Status: DC

## 2023-12-01 NOTE — Patient Instructions (Signed)
Zoledronic Acid Injection (Cancer) What is this medication? ZOLEDRONIC ACID (ZOE le dron ik AS id) treats high calcium levels in the blood caused by cancer. It may also be used with chemotherapy to treat weakened bones caused by cancer. It works by slowing down the release of calcium from bones. This lowers calcium levels in your blood. It also makes your bones stronger and less likely to break (fracture). It belongs to a group of medications called bisphosphonates. This medicine may be used for other purposes; ask your health care provider or pharmacist if you have questions. COMMON BRAND NAME(S): Zometa, Zometa Powder What should I tell my care team before I take this medication? They need to know if you have any of these conditions: Dehydration Dental disease Kidney disease Liver disease Low levels of calcium in the blood Lung or breathing disease, such as asthma Receiving steroids, such as dexamethasone or prednisone An unusual or allergic reaction to zoledronic acid, other medications, foods, dyes, or preservatives Pregnant or trying to get pregnant Breast-feeding How should I use this medication? This medication is injected into a vein. It is given by your care team in a hospital or clinic setting. Talk to your care team about the use of this medication in children. Special care may be needed. Overdosage: If you think you have taken too much of this medicine contact a poison control center or emergency room at once. NOTE: This medicine is only for you. Do not share this medicine with others. What if I miss a dose? Keep appointments for follow-up doses. It is important not to miss your dose. Call your care team if you are unable to keep an appointment. What may interact with this medication? Certain antibiotics given by injection Diuretics, such as bumetanide, furosemide NSAIDs, medications for pain and inflammation, such as ibuprofen or naproxen Teriparatide Thalidomide This list  may not describe all possible interactions. Give your health care provider a list of all the medicines, herbs, non-prescription drugs, or dietary supplements you use. Also tell them if you smoke, drink alcohol, or use illegal drugs. Some items may interact with your medicine. What should I watch for while using this medication? Visit your care team for regular checks on your progress. It may be some time before you see the benefit from this medication. Some people who take this medication have severe bone, joint, or muscle pain. This medication may also increase your risk for jaw problems or a broken thigh bone. Tell your care team right away if you have severe pain in your jaw, bones, joints, or muscles. Tell you care team if you have any pain that does not go away or that gets worse. Tell your dentist and dental surgeon that you are taking this medication. You should not have major dental surgery while on this medication. See your dentist to have a dental exam and fix any dental problems before starting this medication. Take good care of your teeth while on this medication. Make sure you see your dentist for regular follow-up appointments. You should make sure you get enough calcium and vitamin D while you are taking this medication. Discuss the foods you eat and the vitamins you take with your care team. Check with your care team if you have severe diarrhea, nausea, and vomiting, or if you sweat a lot. The loss of too much body fluid may make it dangerous for you to take this medication. You may need bloodwork while taking this medication. Talk to your care team if  you wish to become pregnant or think you might be pregnant. This medication can cause serious birth defects. What side effects may I notice from receiving this medication? Side effects that you should report to your care team as soon as possible: Allergic reactions--skin rash, itching, hives, swelling of the face, lips, tongue, or  throat Kidney injury--decrease in the amount of urine, swelling of the ankles, hands, or feet Low calcium level--muscle pain or cramps, confusion, tingling, or numbness in the hands or feet Osteonecrosis of the jaw--pain, swelling, or redness in the mouth, numbness of the jaw, poor healing after dental work, unusual discharge from the mouth, visible bones in the mouth Severe bone, joint, or muscle pain Side effects that usually do not require medical attention (report to your care team if they continue or are bothersome): Constipation Fatigue Fever Loss of appetite Nausea Stomach pain This list may not describe all possible side effects. Call your doctor for medical advice about side effects. You may report side effects to FDA at 1-800-FDA-1088. Where should I keep my medication? This medication is given in a hospital or clinic. It will not be stored at home. NOTE: This sheet is a summary. It may not cover all possible information. If you have questions about this medicine, talk to your doctor, pharmacist, or health care provider.  2024 [employer] Standard (2021-08-13 00:00:00)  Lanreotide Injection What is this medication? LANREOTIDE (lan REE oh tide) treats high levels of growth hormone (acromegaly). It is used when other therapies have not worked well enough or cannot be tolerated. It works by reducing the amount of growth hormone your body makes. This reduces symptoms and the risk of health problems caused by too much growth hormone, such as diabetes and heart disease. It may also be used to treat neuroendocrine tumors, a cancer of the cells that release hormones and other substances in your body. It works by slowing down the release of these substances from the cells. This slows tumor growth. It also decreases the symptoms of carcinoid syndrome, such as flushing or diarrhea. This medicine may be used for other purposes; ask your health care provider or pharmacist if you have  questions. COMMON BRAND NAME(S): Somatuline Depot What should I tell my care team before I take this medication? They need to know if you have any of these conditions: Diabetes Gallbladder disease Heart disease Kidney disease Liver disease Thyroid disease An unusual or allergic reaction to lanreotide, other medications, foods, dyes, or preservatives Pregnant or trying to get pregnant Breast-feeding How should I use this medication? This medication is injected under the skin. It is given by your care team in a hospital or clinic setting. Talk to your care team about the use of this medication in children. Special care may be needed. Overdosage: If you think you have taken too much of this medicine contact a poison control center or emergency room at once. NOTE: This medicine is only for you. Do not share this medicine with others. What if I miss a dose? Keep appointments for follow-up doses. It is important not to miss your dose. Call your care team if you are unable to keep an appointment. What may interact with this medication? Bromocriptine Cyclosporine Certain medications for blood pressure, heart disease, irregular heartbeat Certain medications for diabetes Quinidine Terfenadine This list may not describe all possible interactions. Give your health care provider a list of all the medicines, herbs, non-prescription drugs, or dietary supplements you use. Also tell them if you smoke, drink  alcohol, or use illegal drugs. Some items may interact with your medicine. What should I watch for while using this medication? Visit your care team for regular checks on your progress. Tell your care team if your symptoms do not start to get better or if they get worse. Your condition will be monitored carefully while you are receiving this medication. You may need blood work while you are taking this medication. This medication may increase blood sugar. The risk may be higher in patients who  already have diabetes. Ask your care team what you can do to lower your risk of diabetes while taking this medication. Talk to your care team if you wish to become pregnant or think you may be pregnant. This medication can cause serious birth defects. Do not breast-feed while taking this medication and for 6 months after stopping therapy. This medication may cause infertility. Talk to your care team if you are concerned about your fertility. What side effects may I notice from receiving this medication? Side effects that you should report to your care team as soon as possible: Allergic reactions--skin rash, itching, hives, swelling of the face, lips, tongue, or throat Gallbladder problems--severe stomach pain, nausea, vomiting, fever High blood sugar (hyperglycemia)--increased thirst or amount of urine, unusual weakness or fatigue, blurry vision Increase in blood pressure Low blood sugar (hypoglycemia)--tremors or shaking, anxiety, sweating, cold or clammy skin, confusion, dizziness, rapid heartbeat Low thyroid levels (hypothyroidism)--unusual weakness or fatigue, increased sensitivity to cold, constipation, hair loss, dry skin, weight gain, feelings of depression Slow heartbeat--dizziness, feeling faint or lightheaded, confusion, trouble breathing, unusual weakness or fatigue Side effects that usually do not require medical attention (report to your care team if they continue or are bothersome): Diarrhea Dizziness Headache Muscle spasms Nausea Pain, redness, irritation, or bruising at the injection site Stomach pain This list may not describe all possible side effects. Call your doctor for medical advice about side effects. You may report side effects to FDA at 1-800-FDA-1088. Where should I keep my medication? This medication is given in a hospital or clinic. It will not be stored at home. NOTE: This sheet is a summary. It may not cover all possible information. If you have questions about  this medicine, talk to your doctor, pharmacist, or health care provider.  2024 [employer] Standard (2021-11-10 00:00:00)

## 2023-12-15 DIAGNOSIS — F17200 Nicotine dependence, unspecified, uncomplicated: Secondary | ICD-10-CM | POA: Insufficient documentation

## 2024-01-01 ENCOUNTER — Ambulatory Visit: Payer: Self-pay | Admitting: Internal Medicine

## 2024-01-01 ENCOUNTER — Encounter: Payer: Self-pay | Admitting: Internal Medicine

## 2024-01-01 ENCOUNTER — Ambulatory Visit (INDEPENDENT_AMBULATORY_CARE_PROVIDER_SITE_OTHER): Admitting: Internal Medicine

## 2024-01-01 VITALS — BP 124/78 | HR 80 | Temp 98.3°F | Ht 62.75 in | Wt 160.0 lb

## 2024-01-01 DIAGNOSIS — Z0001 Encounter for general adult medical examination with abnormal findings: Secondary | ICD-10-CM | POA: Diagnosis not present

## 2024-01-01 DIAGNOSIS — H6122 Impacted cerumen, left ear: Secondary | ICD-10-CM | POA: Diagnosis not present

## 2024-01-01 DIAGNOSIS — E559 Vitamin D deficiency, unspecified: Secondary | ICD-10-CM

## 2024-01-01 DIAGNOSIS — I10 Essential (primary) hypertension: Secondary | ICD-10-CM

## 2024-01-01 DIAGNOSIS — F17200 Nicotine dependence, unspecified, uncomplicated: Secondary | ICD-10-CM | POA: Insufficient documentation

## 2024-01-01 DIAGNOSIS — E538 Deficiency of other specified B group vitamins: Secondary | ICD-10-CM | POA: Diagnosis not present

## 2024-01-01 DIAGNOSIS — R739 Hyperglycemia, unspecified: Secondary | ICD-10-CM | POA: Diagnosis not present

## 2024-01-01 DIAGNOSIS — H6120 Impacted cerumen, unspecified ear: Secondary | ICD-10-CM | POA: Insufficient documentation

## 2024-01-01 LAB — URINALYSIS, ROUTINE W REFLEX MICROSCOPIC
Bilirubin Urine: NEGATIVE
Ketones, ur: NEGATIVE
Leukocytes,Ua: NEGATIVE
Nitrite: NEGATIVE
Specific Gravity, Urine: 1.02 (ref 1.000–1.030)
Total Protein, Urine: NEGATIVE
Urine Glucose: NEGATIVE
Urobilinogen, UA: 0.2 (ref 0.0–1.0)
pH: 6 (ref 5.0–8.0)

## 2024-01-01 LAB — LIPID PANEL
Cholesterol: 169 mg/dL (ref 0–200)
HDL: 65.8 mg/dL (ref >39.00)
LDL Cholesterol: 92 mg/dL (ref 0–99)
NonHDL: 103.23
Total CHOL/HDL Ratio: 3
Triglycerides: 54 mg/dL (ref 0.0–149.0)
VLDL: 10.8 mg/dL (ref 0.0–40.0)

## 2024-01-01 LAB — VITAMIN D 25 HYDROXY (VIT D DEFICIENCY, FRACTURES): VITD: 35.56 ng/mL (ref 30.00–100.00)

## 2024-01-01 LAB — TSH: TSH: 0.78 u[IU]/mL (ref 0.35–5.50)

## 2024-01-01 LAB — HEMOGLOBIN A1C: Hgb A1c MFr Bld: 6.2 % (ref 4.6–6.5)

## 2024-01-01 LAB — VITAMIN B12: Vitamin B-12: 538 pg/mL (ref 211–911)

## 2024-01-01 NOTE — Assessment & Plan Note (Signed)
 Age and sex appropriate education and counseling updated with regular exercise and diet Referrals for preventative services - declines referral LDCT screening for now Immunizations addressed - for shingrix, tdap and prevnar 20 at pharmacy Smoking counseling  - pt counsled to quit, pt not ready Evidence for depression or other mood disorder - none significant Most recent labs reviewed. I have personally reviewed and have noted: 1) the patient's medical and social history 2) The patient's current medications and supplements 3) The patient's height, weight, and BMI have been recorded in the chart

## 2024-01-01 NOTE — Assessment & Plan Note (Signed)
 BP Readings from Last 3 Encounters:  01/01/24 124/78  12/01/23 127/79  11/23/23 (!) 121/97   Stable, pt to continue medical treatment hct 12.5 every day, losartan  50 qd

## 2024-01-01 NOTE — Assessment & Plan Note (Signed)
 Pt counsled to quit, pt not ready

## 2024-01-01 NOTE — Patient Instructions (Addendum)
 Please continue all other medications as before, and refills have been done if requested.  Please have the pharmacy call with any other refills you may need.  Please continue your efforts at being more active, low cholesterol diet, and weight control.  You are otherwise up to date with prevention measures today.  Please keep your appointments with your specialists as you may have planned  Please go to the LAB at the blood drawing area for the tests to be done  You will be contacted by phone if any changes need to be made immediately.  Otherwise, you will receive a letter about your results with an explanation, but please check with MyChart first.  Please make an Appointment to return for your 1 year visit, or sooner if needed  Good luck at your [facility] visit in [place] on wed.

## 2024-01-01 NOTE — Assessment & Plan Note (Signed)
 Lab Results  Component Value Date   HGBA1C 6.2 01/01/2024   Stable, pt to continue current medical treatment  - diet, wt control

## 2024-01-01 NOTE — Assessment & Plan Note (Signed)
 Last vitamin D  Lab Results  Component Value Date   VD25OH 35.56 01/01/2024   Low, to start oral replacement

## 2024-01-01 NOTE — Assessment & Plan Note (Signed)
 With mild hearing impairment, pt declines irrigation, ok for debrox and home flushing

## 2024-01-01 NOTE — Progress Notes (Signed)
 Patient ID: Michelle Solomon, female   DOB: 04/16/1961, 63 y.o.   MRN: [id number]         Chief Complaint:: wellness exam and hyperglycemia, left ear wax impaction, smoker, low vit d, htn, hyperglycemia       HPI:  Michelle Solomon is a 63 y.o. female here for wellness exam; still smoking, not ready to quit, declines LDCT screening referral,  for shingrix and prevnar 20 and tdap at the pharmacy, o/w up to date                       Also Pt denies chest pain, increased sob or doe, wheezing, orthopnea, PND, increased LE swelling, palpitations, dizziness or syncope.   Pt denies polydipsia, polyuria, or new focal neuro s/s.  Pt denies fever, wt loss, night sweats, loss of appetite, or other constitutional symptoms  Also has mild intermittent decreased hearing to left ear with cerumen impaction but does not want irrigation today, prefers debrox at home.  Has neuroendocrine tumor eval and f/u at [facility] in 2 days.    Wt Readings from Last 3 Encounters:  01/01/24 160 lb (72.6 kg)  12/01/23 159 lb (72.1 kg)  11/23/23 156 lb 6.4 oz (70.9 kg)   BP Readings from Last 3 Encounters:  01/01/24 124/78  12/01/23 127/79  11/23/23 (!) 121/97   Immunization History  Administered Date(s) Administered   Influenza Split 04/05/2012   Influenza Whole 05/05/2008, 04/03/2009   PFIZER(Purple Top)SARS-COV-2 Vaccination 10/12/2019, 12/03/2019   Td 05/26/2008   Health Maintenance Due  Topic Date Due   Pneumococcal Vaccine 71-79 Years old (1 of 2 - PCV) Never done   Zoster Vaccines- Shingrix (1 of 2) Never done   DTaP/Tdap/Td (2 - Tdap) 05/26/2018   Lung Cancer Screening  01/08/2022      Past Medical History:  Diagnosis Date   Acute bronchitis 06/06/2010   ALLERGIC RHINITIS 05/29/2007   Allergy    seasonal   Anal or rectal pain 06/23/2008   ANEMIA-IRON  DEFICIENCY 05/29/2007   Anxiety    ASTHMA 05/29/2007   Asthma    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   Cancer (HCC)    Neuro endocrine  tumor   CONJUNCTIVITIS, ALLERGIC 11/27/2008   HEMORRHOIDS 06/16/2008   HTN (hypertension)    Hx of adenomatous colonic polyps 11/2019   NIPPLE DISCHARGE 01/21/2010   Other specified forms of hearing loss 10/08/2009   Overweight(278.02) 05/29/2007   RASH-NONVESICULAR 05/29/2007   Wheezing 06/22/2010   Past Surgical History:  Procedure Laterality Date   COLONOSCOPY     COLONOSCOPY WITH PROPOFOL  N/A 08/18/2023   Procedure: COLONOSCOPY WITH PROPOFOL ;  Surgeon: Leigh Elspeth SQUIBB, MD;  Location: [facility];  Service: Gastroenterology;  Laterality: N/A;   POLYPECTOMY  08/18/2023   Procedure: POLYPECTOMY;  Surgeon: Leigh Elspeth SQUIBB, MD;  Location: [facility];  Service: Gastroenterology;;    reports that she has been smoking cigarettes. She started smoking about 44 years ago. She has a 20 pack-year smoking history. She has never used smokeless tobacco. She reports that she does not currently use alcohol. She reports that she does not use drugs. family history includes Allergies in her sister; Cancer in her paternal aunt; Diabetes in her paternal aunt and sister; Hypertension in her brother, maternal aunt, mother, sister, and sister; Prostate cancer in her brother and father; Stroke in her maternal aunt. Allergies  Allergen Reactions   Shellfish Allergy Anaphylaxis   Current Outpatient Medications on File Prior  to Visit  Medication Sig Dispense Refill   buPROPion  (WELLBUTRIN  XL) 300 MG 24 hr tablet TAKE 1 TABLET(300 MG) BY MOUTH DAILY 90 tablet 3   cholecalciferol (VITAMIN D3) 25 MCG (1000 UNIT) tablet Take 1,000 Units by mouth daily.     cyanocobalamin  100 MCG tablet Take 100 mcg by mouth.     ferrous sulfate  325 (65 FE) MG tablet Take 1 tablet (325 mg total) by mouth daily with breakfast. Please take with a source of Vitamin C 90 tablet 3   GARLIC PO Take 1 tablet by mouth daily.     GINKGO BILOBA EXTRACT PO Take 1 tablet by mouth daily.     hydrochlorothiazide  (HYDRODIURIL ) 12.5 MG  tablet TAKE 1 TABLET(12.5 MG) BY MOUTH DAILY 30 tablet 0   lanreotide acetate  (SOMATULINE DEPOT ) 120 MG/0.5ML injection Inject 120 mg into the skin every 28 (twenty-eight) days. 0.5 mL 12   loratadine (CLARITIN) 10 MG tablet Take 10 mg by mouth daily as needed for allergies.     losartan  (COZAAR ) 50 MG tablet TAKE 1 TABLET(50 MG) BY MOUTH DAILY 90 tablet 3   Misc Natural Products (TURMERIC, CURCUMIN, PO) Take by mouth.     NICOTINE  POLACRILEX MT Take 4 mg by mouth.     ondansetron  (ZOFRAN ) 8 MG tablet Take 1 tablet (8 mg total) by mouth 2 (two) times daily as needed for nausea or vomiting. 20 tablet 0   ondansetron  (ZOFRAN ) 8 MG tablet Take 1 tablet (8 mg total) by mouth 2 (two) times daily as needed for nausea or vomiting. 20 tablet 0   oxyCODONE -acetaminophen  (PERCOCET/ROXICET) 5-325 MG tablet Take 1-2 tablets by mouth every 4 (four) hours as needed for severe pain (pain score 7-10) or moderate pain (pain score 4-6). 60 tablet 0   polyethylene glycol (MIRALAX ) 17 g packet Take 17 g by mouth 2 (two) times daily. 60 packet 0   Turmeric 500 MG CAPS Take 500 mg by mouth daily.     VITAMIN D -VITAMIN K PO Take 1 tablet by mouth daily.     zolpidem  (AMBIEN ) 10 MG tablet Take 1 tablet (10 mg total) by mouth at bedtime as needed for sleep. 90 tablet 1   Current Facility-Administered Medications on File Prior to Visit  Medication Dose Route Frequency Provider Last Rate Last Admin   octreotide  (SANDOSTATIN  LAR) 30 MG IM injection                 ROS:  All others reviewed and negative.  Objective        PE:  BP 124/78 (BP Location: Right Arm, Patient Position: Sitting, Cuff Size: Normal)   Pulse 80   Temp 98.3 F (36.8 C) (Oral)   Ht 5' 2.75 (1.594 m)   Wt 160 lb (72.6 kg)   LMP 10/26/2017   SpO2 99%   BMI 28.57 kg/m                 Constitutional: Pt appears in NAD               HENT: Head: NCAT.                Right Ear: External ear normal.                 Left Ear: External ear normal.                 Eyes: . Pupils are equal, round, and reactive to light. Conjunctivae and EOM are  normal               Nose: without d/c or deformity               Neck: Neck supple. Gross normal ROM               Cardiovascular: Normal rate and regular rhythm.                 Pulmonary/Chest: Effort normal and breath sounds without rales or wheezing.                Abd:  Soft, NT, ND, + BS, no organomegaly               Neurological: Pt is alert. At baseline orientation, motor grossly intact               Skin: Skin is warm. No rashes, no other new lesions, LE edema - none               Psychiatric: Pt behavior is normal without agitation   Micro: none  Cardiac tracings I have personally interpreted today:  none  Pertinent Radiological findings (summarize): none   Lab Results  Component Value Date   WBC 2.4 (L) 11/23/2023   HGB 11.6 (L) 11/23/2023   HCT 35.4 (L) 11/23/2023   PLT 242 11/23/2023   GLUCOSE 114 (H) 11/23/2023   CHOL 169 01/01/2024   TRIG 54.0 01/01/2024   HDL 65.80 01/01/2024   LDLCALC 92 01/01/2024   ALT 11 11/23/2023   AST 16 11/23/2023   NA 138 11/23/2023   K 3.9 11/23/2023   CL 102 11/23/2023   CREATININE 0.66 11/23/2023   BUN 12 11/23/2023   CO2 30 11/23/2023   TSH 0.78 01/01/2024   INR 1.1 08/17/2023   HGBA1C 6.2 01/01/2024   Assessment/Plan:  Michelle Solomon is a 63 y.o. Black or African American [2] female with  has a past medical history of Acute bronchitis (06/06/2010), ALLERGIC RHINITIS (05/29/2007), Allergy, Anal or rectal pain (06/23/2008), ANEMIA-IRON  DEFICIENCY (05/29/2007), Anxiety, ASTHMA (05/29/2007), Asthma, Benign carcinoid tumor of the rectum (05/26/2008), Cancer (HCC), CONJUNCTIVITIS, ALLERGIC (11/27/2008), HEMORRHOIDS (06/16/2008), HTN (hypertension), adenomatous colonic polyps (11/2019), NIPPLE DISCHARGE (01/21/2010), Other specified forms of hearing loss (10/08/2009), Overweight(278.02) (05/29/2007), RASH-NONVESICULAR (05/29/2007), and Wheezing  (06/22/2010).  Encounter for well adult exam with abnormal findings Age and sex appropriate education and counseling updated with regular exercise and diet Referrals for preventative services - declines referral LDCT screening for now Immunizations addressed - for shingrix, tdap and prevnar 20 at pharmacy Smoking counseling  - pt counsled to quit, pt not ready Evidence for depression or other mood disorder - none significant Most recent labs reviewed. I have personally reviewed and have noted: 1) the patient's medical and social history 2) The patient's current medications and supplements 3) The patient's height, weight, and BMI have been recorded in the chart   Vitamin D  deficiency Last vitamin D  Lab Results  Component Value Date   VD25OH 35.56 01/01/2024   Low, to start oral replacement  Hyperglycemia Lab Results  Component Value Date   HGBA1C 6.2 01/01/2024   Stable, pt to continue current medical treatment - diet, wt control   HTN (hypertension) BP Readings from Last 3 Encounters:  01/01/24 124/78  12/01/23 127/79  11/23/23 (!) 121/97   Stable, pt to continue medical treatment hct 12.5 every day, losartan  50 qd   Smoker Pt counsled to quit, pt not ready  Cerumen impaction With  mild hearing impairment, pt declines irrigation, ok for debrox and home flushing  Followup: Return in about 1 year (around 12/31/2024).  Michelle Rush, MD 01/01/2024 9:22 PM [facility]

## 2024-01-01 NOTE — Progress Notes (Signed)
 The test results show that your current treatment is OK, as the tests are stable.  Please continue the same plan.  There is no other need for change of treatment or further evaluation based on these results, at this time.  thanks

## 2024-01-15 ENCOUNTER — Inpatient Hospital Stay: Admitting: Nurse Practitioner

## 2024-01-18 ENCOUNTER — Encounter: Payer: Self-pay | Admitting: Hematology and Oncology

## 2024-01-18 NOTE — Progress Notes (Signed)
Erroneous/ appt cancelled

## 2024-01-23 ENCOUNTER — Other Ambulatory Visit: Payer: Self-pay

## 2024-01-23 MED ORDER — HYDROCHLOROTHIAZIDE 12.5 MG PO TABS: ORAL_TABLET | ORAL | 0 refills | Status: DC

## 2024-04-20 ENCOUNTER — Other Ambulatory Visit: Payer: Self-pay | Admitting: Internal Medicine

## 2024-07-29 ENCOUNTER — Other Ambulatory Visit: Payer: Self-pay | Admitting: Internal Medicine
